# Patient Record
Sex: Male | Born: 1950 | Race: Black or African American | Hispanic: No | Marital: Married | Smoking: Former smoker
Health system: Southern US, Community
[De-identification: ages and names within clinical notes are randomized; demographics above are authoritative.]

## PROBLEM LIST (undated history)

## (undated) DIAGNOSIS — I951 Orthostatic hypotension: Secondary | ICD-10-CM

## (undated) DIAGNOSIS — K429 Umbilical hernia without obstruction or gangrene: Secondary | ICD-10-CM

## (undated) DIAGNOSIS — I639 Cerebral infarction, unspecified: Secondary | ICD-10-CM

## (undated) DIAGNOSIS — H3320 Serous retinal detachment, unspecified eye: Secondary | ICD-10-CM

## (undated) DIAGNOSIS — G459 Transient cerebral ischemic attack, unspecified: Secondary | ICD-10-CM

## (undated) DIAGNOSIS — I10 Essential (primary) hypertension: Secondary | ICD-10-CM

## (undated) DIAGNOSIS — E785 Hyperlipidemia, unspecified: Secondary | ICD-10-CM

## (undated) DIAGNOSIS — J449 Chronic obstructive pulmonary disease, unspecified: Secondary | ICD-10-CM

## (undated) DIAGNOSIS — N189 Chronic kidney disease, unspecified: Secondary | ICD-10-CM

## (undated) DIAGNOSIS — Z9989 Dependence on other enabling machines and devices: Secondary | ICD-10-CM

## (undated) DIAGNOSIS — N186 End stage renal disease: Secondary | ICD-10-CM

## (undated) DIAGNOSIS — R569 Unspecified convulsions: Secondary | ICD-10-CM

## (undated) DIAGNOSIS — E119 Type 2 diabetes mellitus without complications: Secondary | ICD-10-CM

## (undated) DIAGNOSIS — E11319 Type 2 diabetes mellitus with unspecified diabetic retinopathy without macular edema: Secondary | ICD-10-CM

## (undated) DIAGNOSIS — G4733 Obstructive sleep apnea (adult) (pediatric): Secondary | ICD-10-CM

## (undated) DIAGNOSIS — E669 Obesity, unspecified: Secondary | ICD-10-CM

## (undated) HISTORY — DX: Chronic kidney disease, unspecified: N18.9

## (undated) HISTORY — DX: Chronic obstructive pulmonary disease, unspecified: J44.9

## (undated) HISTORY — DX: Hyperlipidemia, unspecified: E78.5

## (undated) HISTORY — PX: SEPTOPLASTY: SUR1290

## (undated) HISTORY — DX: Type 2 diabetes mellitus with unspecified diabetic retinopathy without macular edema: E11.319

## (undated) HISTORY — DX: Cerebral infarction, unspecified: I63.9

## (undated) HISTORY — PX: CIRCUMCISION: SUR203

## (undated) HISTORY — PX: HERNIA REPAIR: SHX51

## (undated) HISTORY — DX: Dependence on other enabling machines and devices: Z99.89

## (undated) HISTORY — DX: Unspecified convulsions: R56.9

## (undated) HISTORY — DX: Serous retinal detachment, unspecified eye: H33.20

## (undated) HISTORY — DX: Umbilical hernia without obstruction or gangrene: K42.9

## (undated) HISTORY — PX: HAMMER TOE SURGERY: SHX385

## (undated) HISTORY — DX: Obstructive sleep apnea (adult) (pediatric): G47.33

## (undated) HISTORY — DX: Obesity, unspecified: E66.9

## (undated) HISTORY — PX: EYE SURGERY: SHX253

---

## 1999-05-29 ENCOUNTER — Emergency Department (HOSPITAL_COMMUNITY): Admission: EM | Admit: 1999-05-29 | Discharge: 1999-05-29 | Payer: Self-pay | Admitting: Emergency Medicine

## 2005-11-15 ENCOUNTER — Ambulatory Visit: Payer: Self-pay | Admitting: Cardiovascular Disease

## 2005-12-06 ENCOUNTER — Ambulatory Visit: Payer: Self-pay

## 2010-03-08 ENCOUNTER — Inpatient Hospital Stay (HOSPITAL_COMMUNITY): Admission: EM | Admit: 2010-03-08 | Discharge: 2010-03-12 | Payer: Self-pay | Admitting: Emergency Medicine

## 2010-03-08 ENCOUNTER — Encounter (INDEPENDENT_AMBULATORY_CARE_PROVIDER_SITE_OTHER): Payer: Self-pay | Admitting: Emergency Medicine

## 2010-03-08 ENCOUNTER — Ambulatory Visit: Payer: Self-pay | Admitting: Cardiology

## 2010-09-10 LAB — CK TOTAL AND CKMB (NOT AT ARMC)
CK, MB: 5.9 ng/mL — ABNORMAL HIGH (ref 0.3–4.0)
CK, MB: 7.2 ng/mL (ref 0.3–4.0)
Relative Index: 1.4 (ref 0.0–2.5)
Relative Index: 1.6 (ref 0.0–2.5)
Total CK: 369 U/L — ABNORMAL HIGH (ref 7–232)

## 2010-09-10 LAB — GLUCOSE, CAPILLARY
Glucose-Capillary: 236 mg/dL — ABNORMAL HIGH (ref 70–99)
Glucose-Capillary: 240 mg/dL — ABNORMAL HIGH (ref 70–99)
Glucose-Capillary: 101 mg/dL — ABNORMAL HIGH (ref 70–99)
Glucose-Capillary: 117 mg/dL — ABNORMAL HIGH (ref 70–99)
Glucose-Capillary: 159 mg/dL — ABNORMAL HIGH (ref 70–99)
Glucose-Capillary: 178 mg/dL — ABNORMAL HIGH (ref 70–99)
Glucose-Capillary: 206 mg/dL — ABNORMAL HIGH (ref 70–99)
Glucose-Capillary: 226 mg/dL — ABNORMAL HIGH (ref 70–99)
Glucose-Capillary: 243 mg/dL — ABNORMAL HIGH (ref 70–99)
Glucose-Capillary: 269 mg/dL — ABNORMAL HIGH (ref 70–99)
Glucose-Capillary: 273 mg/dL — ABNORMAL HIGH (ref 70–99)
Glucose-Capillary: 282 mg/dL — ABNORMAL HIGH (ref 70–99)
Glucose-Capillary: 296 mg/dL — ABNORMAL HIGH (ref 70–99)
Glucose-Capillary: 338 mg/dL — ABNORMAL HIGH (ref 70–99)
Glucose-Capillary: 363 mg/dL — ABNORMAL HIGH (ref 70–99)
Glucose-Capillary: 392 mg/dL — ABNORMAL HIGH (ref 70–99)
Glucose-Capillary: 461 mg/dL — ABNORMAL HIGH (ref 70–99)
Glucose-Capillary: 543 mg/dL — ABNORMAL HIGH (ref 70–99)
Glucose-Capillary: 84 mg/dL (ref 70–99)
Glucose-Capillary: 93 mg/dL (ref 70–99)

## 2010-09-10 LAB — CBC
HCT: 39.3 % (ref 39.0–52.0)
HCT: 39.7 % (ref 39.0–52.0)
Hemoglobin: 13.6 g/dL (ref 13.0–17.0)
Hemoglobin: 14.1 g/dL (ref 13.0–17.0)
MCH: 27.6 pg (ref 26.0–34.0)
MCH: 28 pg (ref 26.0–34.0)
MCHC: 34.1 g/dL (ref 30.0–36.0)
MCV: 80.7 fL (ref 78.0–100.0)
Platelets: 214 10*3/uL (ref 150–400)
RBC: 4.92 MIL/uL (ref 4.22–5.81)
RBC: 5.03 MIL/uL (ref 4.22–5.81)
RDW: 12.5 % (ref 11.5–15.5)
WBC: 7.2 10*3/uL (ref 4.0–10.5)

## 2010-09-10 LAB — POCT CARDIAC MARKERS
CKMB, poc: 5 ng/mL (ref 1.0–8.0)
Myoglobin, poc: 203 ng/mL (ref 12–200)
Troponin i, poc: <0.05 ng/mL (ref 0.00–0.09)

## 2010-09-10 LAB — URINALYSIS, ROUTINE W REFLEX MICROSCOPIC
Bilirubin Urine: NEGATIVE
Nitrite: NEGATIVE
Protein, ur: >300 mg/dL — AB
Specific Gravity, Urine: 1.027 (ref 1.005–1.030)
Urobilinogen, UA: 0.2 mg/dL (ref 0.0–1.0)

## 2010-09-10 LAB — COMPREHENSIVE METABOLIC PANEL
ALT: 24 U/L (ref 0–53)
AST: 30 U/L (ref 0–37)
Alkaline Phosphatase: 100 U/L (ref 39–117)
CO2: 25 mEq/L (ref 19–32)
Chloride: 98 mEq/L (ref 96–112)
Creatinine, Ser: 1.51 mg/dL — ABNORMAL HIGH (ref 0.4–1.5)
GFR calc Af Amer: 58 mL/min — ABNORMAL LOW (ref >60)
GFR calc non Af Amer: 48 mL/min — ABNORMAL LOW (ref >60)
Potassium: 4.7 mEq/L (ref 3.5–5.1)
Sodium: 132 mEq/L — ABNORMAL LOW (ref 135–145)
Total Bilirubin: 0.8 mg/dL (ref 0.3–1.2)

## 2010-09-10 LAB — LIPID PANEL
Cholesterol: 219 mg/dL — ABNORMAL HIGH (ref 0–200)
HDL: 37 mg/dL — ABNORMAL LOW (ref >39)
LDL Cholesterol: 144 mg/dL — ABNORMAL HIGH (ref 0–99)
LDL Cholesterol: 147 mg/dL — ABNORMAL HIGH (ref 0–99)
Total CHOL/HDL Ratio: 5.9 RATIO
Total CHOL/HDL Ratio: 5.9 RATIO
Triglycerides: 167 mg/dL — ABNORMAL HIGH (ref <150)
Triglycerides: 174 mg/dL — ABNORMAL HIGH (ref <150)
VLDL: 33 mg/dL (ref 0–40)
VLDL: 35 mg/dL (ref 0–40)

## 2010-09-10 LAB — APTT
aPTT: 26 s (ref 24–37)
aPTT: 27 seconds (ref 24–37)

## 2010-09-10 LAB — BASIC METABOLIC PANEL
BUN: 24 mg/dL — ABNORMAL HIGH (ref 6–23)
Calcium: 9.1 mg/dL (ref 8.4–10.5)
GFR calc non Af Amer: 51 mL/min — ABNORMAL LOW (ref >60)
Glucose, Bld: 198 mg/dL — ABNORMAL HIGH (ref 70–99)
Potassium: 3.8 mEq/L (ref 3.5–5.1)

## 2010-09-10 LAB — URINE MICROSCOPIC-ADD ON

## 2010-09-10 LAB — DIFFERENTIAL
Basophils Absolute: 0 10*3/uL (ref 0.0–0.1)
Eosinophils Absolute: 0.1 10*3/uL (ref 0.0–0.7)
Eosinophils Relative: 1 % (ref 0–5)

## 2010-09-10 LAB — PROTIME-INR
INR: 0.95 (ref 0.00–1.49)
Prothrombin Time: 12.9 seconds (ref 11.6–15.2)

## 2010-09-10 LAB — TROPONIN I: Troponin I: 0.03 ng/mL (ref 0.00–0.06)

## 2013-02-14 ENCOUNTER — Other Ambulatory Visit: Payer: Self-pay | Admitting: Internal Medicine

## 2013-02-14 DIAGNOSIS — R531 Weakness: Secondary | ICD-10-CM

## 2013-02-14 DIAGNOSIS — R609 Edema, unspecified: Secondary | ICD-10-CM

## 2013-02-15 ENCOUNTER — Ambulatory Visit
Admission: RE | Admit: 2013-02-15 | Discharge: 2013-02-15 | Disposition: A | Discharge status: Home / Self Care | Payer: BC Managed Care – PPO | Source: Ambulatory Visit | Attending: Internal Medicine | Admitting: Internal Medicine

## 2013-02-15 DIAGNOSIS — R609 Edema, unspecified: Secondary | ICD-10-CM

## 2013-02-20 ENCOUNTER — Ambulatory Visit
Admission: RE | Admit: 2013-02-20 | Discharge: 2013-02-20 | Disposition: A | Discharge status: Home / Self Care | Payer: BC Managed Care – PPO | Source: Ambulatory Visit | Attending: Internal Medicine | Admitting: Internal Medicine

## 2013-02-20 DIAGNOSIS — R531 Weakness: Secondary | ICD-10-CM

## 2013-02-20 DIAGNOSIS — R609 Edema, unspecified: Secondary | ICD-10-CM

## 2014-01-25 ENCOUNTER — Other Ambulatory Visit: Payer: Self-pay | Admitting: Nephrology

## 2014-01-25 DIAGNOSIS — E1129 Type 2 diabetes mellitus with other diabetic kidney complication: Secondary | ICD-10-CM

## 2014-01-29 ENCOUNTER — Ambulatory Visit
Admission: RE | Admit: 2014-01-29 | Discharge: 2014-01-29 | Disposition: A | Discharge status: Home / Self Care | Payer: 59 | Source: Ambulatory Visit | Attending: Nephrology | Admitting: Nephrology

## 2014-01-29 DIAGNOSIS — E1129 Type 2 diabetes mellitus with other diabetic kidney complication: Secondary | ICD-10-CM

## 2014-02-17 ENCOUNTER — Inpatient Hospital Stay (HOSPITAL_COMMUNITY)
Admission: EM | Admit: 2014-02-17 | Discharge: 2014-02-18 | DRG: 149 | Discharge status: Against Medical Advice | Payer: 59 | Attending: Internal Medicine | Admitting: Internal Medicine

## 2014-02-17 ENCOUNTER — Emergency Department (HOSPITAL_COMMUNITY): Payer: 59

## 2014-02-17 ENCOUNTER — Encounter (HOSPITAL_COMMUNITY): Payer: Self-pay | Admitting: Emergency Medicine

## 2014-02-17 DIAGNOSIS — R55 Syncope and collapse: Secondary | ICD-10-CM | POA: Diagnosis not present

## 2014-02-17 DIAGNOSIS — Z8673 Personal history of transient ischemic attack (TIA), and cerebral infarction without residual deficits: Secondary | ICD-10-CM

## 2014-02-17 DIAGNOSIS — I129 Hypertensive chronic kidney disease with stage 1 through stage 4 chronic kidney disease, or unspecified chronic kidney disease: Secondary | ICD-10-CM | POA: Diagnosis present

## 2014-02-17 DIAGNOSIS — Z79899 Other long term (current) drug therapy: Secondary | ICD-10-CM | POA: Diagnosis not present

## 2014-02-17 DIAGNOSIS — Z7982 Long term (current) use of aspirin: Secondary | ICD-10-CM

## 2014-02-17 DIAGNOSIS — N184 Chronic kidney disease, stage 4 (severe): Secondary | ICD-10-CM | POA: Diagnosis present

## 2014-02-17 DIAGNOSIS — E119 Type 2 diabetes mellitus without complications: Secondary | ICD-10-CM | POA: Diagnosis present

## 2014-02-17 DIAGNOSIS — I517 Cardiomegaly: Secondary | ICD-10-CM | POA: Diagnosis present

## 2014-02-17 DIAGNOSIS — Z794 Long term (current) use of insulin: Secondary | ICD-10-CM

## 2014-02-17 DIAGNOSIS — N178 Other acute kidney failure: Secondary | ICD-10-CM

## 2014-02-17 DIAGNOSIS — E1129 Type 2 diabetes mellitus with other diabetic kidney complication: Secondary | ICD-10-CM | POA: Diagnosis present

## 2014-02-17 DIAGNOSIS — I10 Essential (primary) hypertension: Secondary | ICD-10-CM | POA: Diagnosis present

## 2014-02-17 DIAGNOSIS — Z87891 Personal history of nicotine dependence: Secondary | ICD-10-CM

## 2014-02-17 DIAGNOSIS — R42 Dizziness and giddiness: Principal | ICD-10-CM | POA: Diagnosis present

## 2014-02-17 DIAGNOSIS — E860 Dehydration: Secondary | ICD-10-CM | POA: Diagnosis present

## 2014-02-17 DIAGNOSIS — R9431 Abnormal electrocardiogram [ECG] [EKG]: Secondary | ICD-10-CM | POA: Diagnosis present

## 2014-02-17 HISTORY — DX: Type 2 diabetes mellitus without complications: E11.9

## 2014-02-17 HISTORY — DX: Essential (primary) hypertension: I10

## 2014-02-17 HISTORY — DX: Transient cerebral ischemic attack, unspecified: G45.9

## 2014-02-17 LAB — PRO B NATRIURETIC PEPTIDE: Pro B Natriuretic peptide (BNP): 3134 pg/mL — ABNORMAL HIGH (ref 0–125)

## 2014-02-17 LAB — CBC WITH DIFFERENTIAL/PLATELET
Basophils Absolute: 0 10*3/uL (ref 0.0–0.1)
Basophils Relative: 0 % (ref 0–1)
EOS PCT: 1 % (ref 0–5)
Eosinophils Absolute: 0.1 10*3/uL (ref 0.0–0.7)
HCT: 39.1 % (ref 39.0–52.0)
Hemoglobin: 12.7 g/dL — ABNORMAL LOW (ref 13.0–17.0)
LYMPHS ABS: 1.1 10*3/uL (ref 0.7–4.0)
Lymphocytes Relative: 12 % (ref 12–46)
MCH: 26.9 pg (ref 26.0–34.0)
MCHC: 32.5 g/dL (ref 30.0–36.0)
MCV: 82.8 fL (ref 78.0–100.0)
MONO ABS: 0.6 10*3/uL (ref 0.1–1.0)
Monocytes Relative: 7 % (ref 3–12)
Neutro Abs: 7.1 10*3/uL (ref 1.7–7.7)
Neutrophils Relative %: 80 % — ABNORMAL HIGH (ref 43–77)
PLATELETS: 163 10*3/uL (ref 150–400)
RBC: 4.72 MIL/uL (ref 4.22–5.81)
RDW: 13.1 % (ref 11.5–15.5)
WBC: 8.9 10*3/uL (ref 4.0–10.5)

## 2014-02-17 LAB — GLUCOSE, CAPILLARY: Glucose-Capillary: 94 mg/dL (ref 70–99)

## 2014-02-17 LAB — COMPREHENSIVE METABOLIC PANEL
ALT: 24 U/L (ref 0–53)
ANION GAP: 13 (ref 5–15)
AST: 32 U/L (ref 0–37)
Albumin: 3.4 g/dL — ABNORMAL LOW (ref 3.5–5.2)
Alkaline Phosphatase: 73 U/L (ref 39–117)
BUN: 40 mg/dL — ABNORMAL HIGH (ref 6–23)
CALCIUM: 9.1 mg/dL (ref 8.4–10.5)
CO2: 23 mEq/L (ref 19–32)
CREATININE: 3.23 mg/dL — AB (ref 0.50–1.35)
Chloride: 100 mEq/L (ref 96–112)
GFR calc non Af Amer: 19 mL/min — ABNORMAL LOW (ref >90)
GFR, EST AFRICAN AMERICAN: 22 mL/min — AB (ref >90)
GLUCOSE: 103 mg/dL — AB (ref 70–99)
Potassium: 4.4 mEq/L (ref 3.7–5.3)
SODIUM: 136 meq/L — AB (ref 137–147)
TOTAL PROTEIN: 7.1 g/dL (ref 6.0–8.3)
Total Bilirubin: 0.3 mg/dL (ref 0.3–1.2)

## 2014-02-17 LAB — URINALYSIS, ROUTINE W REFLEX MICROSCOPIC
Bilirubin Urine: NEGATIVE
Glucose, UA: NEGATIVE mg/dL
HGB URINE DIPSTICK: NEGATIVE
Ketones, ur: NEGATIVE mg/dL
LEUKOCYTES UA: NEGATIVE
NITRITE: NEGATIVE
SPECIFIC GRAVITY, URINE: 1.019 (ref 1.005–1.030)
Urobilinogen, UA: 1 mg/dL (ref 0.0–1.0)
pH: 6.5 (ref 5.0–8.0)

## 2014-02-17 LAB — URINE MICROSCOPIC-ADD ON

## 2014-02-17 LAB — TROPONIN I

## 2014-02-17 LAB — D-DIMER, QUANTITATIVE (NOT AT ARMC): D DIMER QUANT: 0.55 ug{FEU}/mL — AB (ref 0.00–0.48)

## 2014-02-17 MED ORDER — ONDANSETRON HCL 4 MG/2ML IJ SOLN: 4.0000 mg | Freq: Four times a day (QID) | INTRAMUSCULAR | Status: DC | PRN

## 2014-02-17 MED ORDER — ASPIRIN 81 MG PO CHEW
162.0000 mg | CHEWABLE_TABLET | Freq: Every day | ORAL | Status: DC
  Administered 2014-02-18: 162 mg via ORAL
  Filled 2014-02-17: qty 2

## 2014-02-17 MED ORDER — NEBIVOLOL HCL 5 MG PO TABS
5.0000 mg | ORAL_TABLET | Freq: Every day | ORAL | Status: DC
  Administered 2014-02-17: 5 mg via ORAL
  Filled 2014-02-17: qty 1

## 2014-02-17 MED ORDER — INSULIN ASPART 100 UNIT/ML ~~LOC~~ SOLN: 0.0000 [IU] | Freq: Every day | SUBCUTANEOUS | Status: DC

## 2014-02-17 MED ORDER — HYDRALAZINE HCL 50 MG PO TABS
50.0000 mg | ORAL_TABLET | Freq: Two times a day (BID) | ORAL | Status: DC
  Administered 2014-02-18 (×2): 50 mg via ORAL
  Filled 2014-02-17 (×3): qty 1

## 2014-02-17 MED ORDER — ACETAMINOPHEN 650 MG RE SUPP: 650.0000 mg | Freq: Four times a day (QID) | RECTAL | Status: DC | PRN

## 2014-02-17 MED ORDER — NEBIVOLOL HCL 5 MG PO TABS
5.0000 mg | ORAL_TABLET | Freq: Every day | ORAL | Status: DC
  Administered 2014-02-18: 5 mg via ORAL
  Filled 2014-02-17: qty 1

## 2014-02-17 MED ORDER — ENOXAPARIN SODIUM 30 MG/0.3ML ~~LOC~~ SOLN
30.0000 mg | SUBCUTANEOUS | Status: DC
  Administered 2014-02-18: 30 mg via SUBCUTANEOUS
  Filled 2014-02-17: qty 0.3

## 2014-02-17 MED ORDER — SODIUM CHLORIDE 0.9 % IV SOLN
1000.0000 mL | INTRAVENOUS | Status: DC
Start: 2014-02-17 — End: 2014-02-18
  Administered 2014-02-18: 1000 mL via INTRAVENOUS

## 2014-02-17 MED ORDER — SODIUM CHLORIDE 0.9 % IJ SOLN
3.0000 mL | Freq: Two times a day (BID) | INTRAMUSCULAR | Status: DC
  Administered 2014-02-18: 3 mL via INTRAVENOUS

## 2014-02-17 MED ORDER — INSULIN GLARGINE 100 UNIT/ML ~~LOC~~ SOLN
16.0000 [IU] | Freq: Every day | SUBCUTANEOUS | Status: DC
  Administered 2014-02-18: 16 [IU] via SUBCUTANEOUS
  Filled 2014-02-17 (×2): qty 0.16

## 2014-02-17 MED ORDER — FOLIC ACID 1 MG PO TABS
2.0000 mg | ORAL_TABLET | Freq: Every day | ORAL | Status: DC
  Administered 2014-02-18: 2 mg via ORAL
  Filled 2014-02-17: qty 2

## 2014-02-17 MED ORDER — INSULIN ASPART 100 UNIT/ML ~~LOC~~ SOLN
0.0000 [IU] | Freq: Three times a day (TID) | SUBCUTANEOUS | Status: DC
Start: 2014-02-18 — End: 2014-02-18

## 2014-02-17 MED ORDER — ATORVASTATIN CALCIUM 40 MG PO TABS
40.0000 mg | ORAL_TABLET | Freq: Every day | ORAL | Status: DC
  Administered 2014-02-18: 40 mg via ORAL
  Filled 2014-02-17: qty 1

## 2014-02-17 MED ORDER — SODIUM CHLORIDE 0.9 % IV SOLN
1000.0000 mL | INTRAVENOUS | Status: DC
  Administered 2014-02-17: 1000 mL via INTRAVENOUS

## 2014-02-17 MED ORDER — POLYETHYLENE GLYCOL 3350 17 G PO PACK
17.0000 g | PACK | Freq: Every day | ORAL | Status: DC | PRN
  Filled 2014-02-17: qty 1

## 2014-02-17 MED ORDER — ACETAMINOPHEN 325 MG PO TABS: 650.0000 mg | ORAL_TABLET | Freq: Four times a day (QID) | ORAL | Status: DC | PRN

## 2014-02-17 MED ORDER — CALCITRIOL 0.25 MCG PO CAPS
0.2500 ug | ORAL_CAPSULE | Freq: Every day | ORAL | Status: DC
  Administered 2014-02-18: 0.25 ug via ORAL
  Filled 2014-02-17: qty 1

## 2014-02-17 MED ORDER — HYDRALAZINE HCL 20 MG/ML IJ SOLN
10.0000 mg | INTRAMUSCULAR | Status: AC
  Administered 2014-02-17: 10 mg via INTRAVENOUS
  Filled 2014-02-17: qty 1

## 2014-02-17 MED ORDER — DOCUSATE SODIUM 100 MG PO CAPS
100.0000 mg | ORAL_CAPSULE | Freq: Two times a day (BID) | ORAL | Status: DC
  Administered 2014-02-18 (×2): 100 mg via ORAL
  Filled 2014-02-17 (×3): qty 1

## 2014-02-17 MED ORDER — PREDNISOLONE ACETATE 1 % OP SUSP
1.0000 [drp] | OPHTHALMIC | Status: DC
  Administered 2014-02-18 (×4): 1 [drp] via OPHTHALMIC
  Filled 2014-02-17 (×2): qty 1

## 2014-02-17 MED ORDER — ONDANSETRON HCL 4 MG PO TABS: 4.0000 mg | ORAL_TABLET | Freq: Four times a day (QID) | ORAL | Status: DC | PRN

## 2014-02-17 MED ORDER — HYDROCODONE-ACETAMINOPHEN 5-325 MG PO TABS: 1.0000 | ORAL_TABLET | ORAL | Status: DC | PRN

## 2014-02-17 NOTE — H&P (Signed)
PCP: Abbott Pao    Chief Complaint:  lightheadedness  HPI: Gregory Hood is a 63 y.o. male   has a past medical history of Hypertension; Diabetes mellitus without complication; and TIA (transient ischemic attack).   Presented with  Patient was standing next to a truck and was getting hot while standing for a prolonged period of time outside. He started to feel lightheaded. Patient states he feels dehydrated. He have not had much to eat or drink this AM. Patient has been on a diet and have been drinking smoothies. He has lost over 30 lb over past 2-3 months.  Denies any loss of concienceness per record wife thought he was "out" but patient reports remembering everything. 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   Hospitalist was called for admission for  presyncope  Review of Systems:    Pertinent positives include:   Constitutional:  No weight loss, night sweats, Fevers, chills, fatigue, weight loss  HEENT:  No headaches, Difficulty swallowing,Tooth/dental problems,Sore throat,  No sneezing, itching, ear ache, nasal congestion, post nasal drip,  Cardio-vascular:  No chest pain, Orthopnea, PND, anasarca, dizziness, palpitations.no Bilateral lower extremity swelling  GI:  No heartburn, indigestion, abdominal pain, nausea, vomiting, diarrhea, change in bowel habits, loss of appetite, melena, blood in stool, hematemesis Resp:  no shortness of breath at rest. No dyspnea on exertion, No excess mucus, no productive cough, No non-productive cough, No coughing up of blood.No change in color of mucus.No wheezing. Skin:  no rash or lesions. No jaundice GU:  no dysuria, change in color of urine, no urgency or frequency. No straining to urinate.  No flank pain.    Musculoskeletal:  No joint pain or no joint swelling. No decreased range of motion. No back pain.  Psych:  No change in mood or affect. No depression or anxiety. No memory loss.  Neuro: no localizing neurological complaints, no tingling, no weakness, no double vision, no gait abnormality, no slurred speech, no confusion  Otherwise ROS are negative except for above, 10 systems were reviewed  Past Medical History: Past Medical History  Diagnosis Date  . Hypertension   . Diabetes mellitus without complication   . TIA (transient ischemic attack)    History reviewed. No pertinent past surgical history.   Medications: Prior to Admission medications   Medication Sig Start Date End Date Taking? Authorizing Provider  aspirin 81 MG tablet Take 162 mg by mouth daily.   Yes Historical Provider, MD  atorvastatin (LIPITOR) 40 MG tablet Take 40 mg by mouth daily.   Yes Historical Provider, MD  Brinzolamide-Brimonidine [facility]) 1-0.2 % SUSP Place 1 drop into the left eye 3 (three) times daily.   Yes Historical Provider, MD  calcitRIOL (ROCALTROL) 0.25 MCG capsule Take 0.25 mcg by mouth daily.   Yes Historical Provider, MD  Difluprednate (DUREZOL) 0.05 % EMUL Place 1 drop into the left eye every 3 (three) hours. Takes 8 times daily   Yes Historical Provider, MD  folic acid (FOLVITE) 1 MG tablet Take 2 mg by mouth daily.   Yes Historical Provider, MD  furosemide (LASIX) 80 MG tablet Take 80 mg by mouth daily.   Yes Historical Provider, MD  Garlic [id number] MG CAPS Take 1,000 mg by mouth daily.  Yes Historical Provider, MD  hydrALAZINE (APRESOLINE) 50 MG tablet Take 50 mg by mouth 2 (two) times daily.   Yes Historical Provider, MD  insulin glargine (LANTUS) 100 UNIT/ML injection Inject 16 Units into the skin at bedtime.   Yes Historical Provider, MD  linagliptin (TRADJENTA) 5 MG TABS tablet Take 5 mg by mouth daily.   Yes Historical Provider, MD  nebivolol (BYSTOLIC) 5 MG tablet Take 10 mg by mouth  daily.    Yes Historical Provider, MD  polyethylene glycol (MIRALAX / GLYCOLAX) packet Take 17 g by mouth daily as needed for moderate constipation.    Yes Historical Provider, MD    Allergies:   Allergies  Allergen Reactions  . Ace Inhibitors Cough    Social History:  Ambulatory   independently   Lives at home   With family   reports that he has quit smoking. He does not have any smokeless tobacco history on file. He reports that he does not drink alcohol or use illicit drugs.   Family History: family history includes Diabetes type II in his father, mother, and sister; Hypertension in his father, mother, and sister; Transient ischemic attack in his mother.     Physical Exam: Patient Vitals for the past 24 hrs:  BP Temp Temp src Pulse Resp SpO2 Height Weight  02/17/14 1930 149/91 mmHg - - 59 14 100 % - -  02/17/14 1927 177/100 mmHg - - - - - - -  02/17/14 1915 177/100 mmHg - - 69 17 100 % - -  02/17/14 1900 171/99 mmHg - - 65 16 100 % - -  02/17/14 1845 178/98 mmHg - - 65 12 100 % - -  02/17/14 1834 169/95 mmHg - - - 16 99 % - -  02/17/14 1715 185/100 mmHg - - 76 14 100 % - -  02/17/14 1645 172/97 mmHg - - 68 15 100 % - -  02/17/14 1615 179/91 mmHg - - 67 - 100 % - -  02/17/14 1602 165/77 mmHg 98.3 F (36.8 C) Oral - 18 100 % 5\' 9"  (1.753 m) 106.142 kg (234 lb)    1. General:  in No Acute distress 2. Psychological: Alert and  Oriented 3. Head/ENT:     Dry Mucous Membranes                          Head Non traumatic, neck supple                          Normal  Dentition 4. SKIN:  decreased Skin turgor,  Skin clean Dry and intact no rash 5. Heart: Regular rate and rhythm no Murmur, Rub or gallop 6. Lungs: Clear to auscultation bilaterally, no wheezes or crackles   7. Abdomen: Soft, non-tender, Non distended 8. Lower extremities: no clubbing, cyanosis, left leg swelling patient states chronic 9. Neurologically strength 5/5 in all 4 ext. CN 2-12 intact  10. MSK: Normal  range of motion  body mass index is 34.54 kg/(m^2).   Labs on Admission:   Recent Labs  02/17/14 1706  NA 136*  K 4.4  CL 100  CO2 23  GLUCOSE 103*  BUN 40*  CREATININE 3.23*  CALCIUM 9.1    Recent Labs  02/17/14 1706  AST 32  ALT 24  ALKPHOS 73  BILITOT 0.3  PROT 7.1  ALBUMIN 3.4*   No results found for this basename: LIPASE, AMYLASE,  in the last 72 hours  Recent Labs  02/17/14 1706  WBC 8.9  NEUTROABS 7.1  HGB 12.7*  HCT 39.1  MCV 82.8  PLT 163   No results found for this basename: CKTOTAL, CKMB, CKMBINDEX, TROPONINI,  in the last 72 hours No results found for this basename: TSH, T4TOTAL, FREET3, T3FREE, THYROIDAB,  in the last 72 hours No results found for this basename: VITAMINB12, FOLATE, FERRITIN, TIBC, IRON, RETICCTPCT,  in the last 72 hours Lab Results  Component Value Date   HGBA1C  Value: 14.6 (NOTE)                                                                       According to the ADA Clinical Practice Recommendations for 2011, when HbA1c is used as a screening test:   >=6.5%   Diagnostic of Diabetes Mellitus           (if abnormal result  is confirmed)  5.7-6.4%   Increased risk of developing Diabetes Mellitus  References:Diagnosis and Classification of Diabetes Mellitus,Diabetes Care,2011,34(Suppl 1):S62-S69 and Standards of Medical Care in         Diabetes - 2011,Diabetes Care,2011,34  (Suppl 1):S11-S61.* 03/08/2010    Estimated Creatinine Clearance: 28.8 ml/min (by C-G formula based on Cr of 3.23). ABG No results found for this basename: phart, pco2, po2, hco3, tco2, acidbasedef, o2sat     No results found for this basename: DDIMER     Other results:  I have pearsonaly reviewed this: ECG REPORT  Rate: 73  Rhythm: Sinus rhythm evidence of LVH ST&T Change: T wave inversion in the lateral leads nonspecific ST segment changes abnormal EKG no old EKG available for comparison   UA protein uria  BNP (last 3 results) No results found  for this basename: PROBNP,  in the last 8760 hours  Filed Weights   02/17/14 1602  Weight: 106.142 kg (234 lb)     Cultures: No results found for this basename: sdes, specrequest, cult, reptstatus      Radiological Exams on Admission: Dg Chest 2 View  02/17/2014   CLINICAL DATA:  Near syncope.  Dizziness.  EXAM: CHEST  2 VIEW  COMPARISON:  None.  FINDINGS: Enlarged cardiac silhouette.  Clear lungs.  Mild scoliosis.  IMPRESSION: Cardiomegaly.  No acute abnormality.   Electronically Signed   By: Enrique Sack M.D.   On: 02/17/2014 17:51   Echo in March 2013 showed Severe LVH.  Chart has been reviewed  Assessment/Plan  76 male with history of diabetes, chronic kidney disease, stroke, hypertension, Severe LVH here with lightheadedness after standing up for too long while currently dieting.   Present on Admission:  . Lightheadedness - in the setting of standing up for prolonged period time. EKG showing evidence of LVH with T wave inversion in the lateral leads. No old EKG available for comparison. Patient denies any chest pain will admit to telemetry cycle cardiac enzymes given the left leg swelling check for DVT. Obtain echo and carotid Dopplers. 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 Currently neurologically intact no neurological complaints  . Hypertension continue home medications monitor on telemetry  . Diabetes mellitus sliding scale hold by mouth meds  . CKD (chronic  kidney disease), stage IV - last creatinine in March was 2.7 currently elevated to 3.4. Patient appears to be somewhat fluid depleted in the setting of recent dieting. Will hold Lasix gentle IV fluids and monitor fluid status  . Dehydration check orthostatics hold Lasix, gentle IV fluids  . Abnormal ECG - cycle cardiac enzymes, chronic telemetry, echo   Prophylaxis:   Lovenox  CODE STATUS:  FULL CODE    Other plan as per orders.  I have spent a total of 55 min on  this admission  Curtisha Bendix 02/17/2014, 7:50 PM  [facility]  Pager [phone]   If 7AM-7PM, please contact the day team taking care of the patient  [email or web address]  Password [email or web address]

## 2014-02-17 NOTE — ED Provider Notes (Signed)
CSN: [id number]     Arrival date & time 02/17/14  1600 History   First MD Initiated Contact with Patient 02/17/14 1607     Chief Complaint  Patient presents with  . Loss of Consciousness      HPI  Patient presents after an episode of seizure-like activity versus near syncope. Patient states that he was in his usual state of health until approximately one hour prior to my evaluation. The patient was walking, felt lightheaded, seemed disoriented, with a contorted face. Patient denies complete loss of consciousness, but the patient's wife states the patient was not responsive for several moments without falling, or suffering trauma. Subsequently, the patient required no intervention, but returned to baseline, with no ongoing pain, confusion, disorientation, and currently has no complaints. Patient states he does have a history of prior TIA or a stroke, and currently is being evaluated for renal dysfunction.   Past Medical History  Diagnosis Date  . Hypertension   . Diabetes mellitus without complication   . TIA (transient ischemic attack)    History reviewed. No pertinent past surgical history. Family History  Problem Relation Age of Onset  . Hypertension Mother   . Diabetes type II Mother   . Transient ischemic attack Mother   . Hypertension Father   . Diabetes type II Father   . Diabetes type II Sister   . Hypertension Sister    History  Substance Use Topics  . Smoking status: Former Research scientist (life sciences)  . Smokeless tobacco: Not on file  . Alcohol Use: No    Review of Systems  Constitutional:       Per HPI, otherwise negative  HENT:       Per HPI, otherwise negative  Respiratory:       Per HPI, otherwise negative  Cardiovascular:       Per HPI, otherwise negative  Gastrointestinal: Negative for vomiting.  Endocrine:       Negative aside from HPI  Genitourinary:       Neg aside from HPI   Musculoskeletal:       Per HPI, otherwise negative  Skin: Negative.   Neurological:  Negative for syncope.      Allergies  Ace inhibitors  Home Medications   Prior to Admission medications   Medication Sig Start Date End Date Taking? Authorizing Provider  aspirin 81 MG tablet Take 162 mg by mouth daily.   Yes Historical Provider, MD  atorvastatin (LIPITOR) 40 MG tablet Take 40 mg by mouth daily.   Yes Historical Provider, MD  Brinzolamide-Brimonidine [facility]) 1-0.2 % SUSP Place 1 drop into the left eye 3 (three) times daily.   Yes Historical Provider, MD  calcitRIOL (ROCALTROL) 0.25 MCG capsule Take 0.25 mcg by mouth daily.   Yes Historical Provider, MD  Difluprednate (DUREZOL) 0.05 % EMUL Place 1 drop into the left eye every 3 (three) hours. Takes 8 times daily   Yes Historical Provider, MD  folic acid (FOLVITE) 1 MG tablet Take 2 mg by mouth daily.   Yes Historical Provider, MD  furosemide (LASIX) 80 MG tablet Take 80 mg by mouth daily.   Yes Historical Provider, MD  Garlic [id number] MG CAPS Take 1,000 mg by mouth daily.   Yes Historical Provider, MD  hydrALAZINE (APRESOLINE) 50 MG tablet Take 50 mg by mouth 2 (two) times daily.   Yes Historical Provider, MD  insulin glargine (LANTUS) 100 UNIT/ML injection Inject 16 Units into the skin at bedtime.   Yes Historical Provider, MD  linagliptin (TRADJENTA) 5 MG TABS tablet Take 5 mg by mouth daily.   Yes Historical Provider, MD  nebivolol (BYSTOLIC) 5 MG tablet Take 10 mg by mouth daily.    Yes Historical Provider, MD  polyethylene glycol (MIRALAX / GLYCOLAX) packet Take 17 g by mouth daily as needed for moderate constipation.    Yes Historical Provider, MD   BP 162/89  Pulse 75  Temp(Src) 98.6 F (37 C) (Oral)  Resp 18  Ht 5' 9.5" (1.765 m)  Wt 236 lb 6.4 oz (107.23 kg)  BMI 34.42 kg/m2  SpO2 98% Physical Exam  Nursing note and vitals reviewed. Constitutional: He is oriented to person, place, and time. He appears well-developed. No distress.  HENT:  Head: Normocephalic and atraumatic.  Eyes: Conjunctivae and EOM  are normal.  Cardiovascular: Normal rate and regular rhythm.   Pulmonary/Chest: Effort normal. No stridor. No respiratory distress.  Abdominal: He exhibits no distension.  Musculoskeletal: He exhibits no edema.  Neurological: He is alert and oriented to person, place, and time. No cranial nerve deficit. He exhibits normal muscle tone. Coordination normal.  Skin: Skin is warm and dry.  Psychiatric: He has a normal mood and affect.    ED Course  Procedures (including critical care time) Labs Review Labs Reviewed  CBC WITH DIFFERENTIAL - Abnormal; Notable for the following:    Hemoglobin 12.7 (*)    Neutrophils Relative % 80 (*)    All other components within normal limits  COMPREHENSIVE METABOLIC PANEL - Abnormal; Notable for the following:    Sodium 136 (*)    Glucose, Bld 103 (*)    BUN 40 (*)    Creatinine, Ser 3.23 (*)    Albumin 3.4 (*)    GFR calc non Af Amer 19 (*)    GFR calc Af Amer 22 (*)    All other components within normal limits  URINALYSIS, ROUTINE W REFLEX MICROSCOPIC - Abnormal; Notable for the following:    Protein, ur >300 (*)    All other components within normal limits  URINE MICROSCOPIC-ADD ON - Abnormal; Notable for the following:    Squamous Epithelial / LPF FEW (*)    All other components within normal limits  D-DIMER, QUANTITATIVE - Abnormal; Notable for the following:    D-Dimer, Quant 0.55 (*)    All other components within normal limits  PRO B NATRIURETIC PEPTIDE - Abnormal; Notable for the following:    Pro B Natriuretic peptide (BNP) 3134.0 (*)    All other components within normal limits  TROPONIN I  GLUCOSE, CAPILLARY  MAGNESIUM  PHOSPHORUS  TSH  COMPREHENSIVE METABOLIC PANEL  CBC  CBC  CREATININE, SERUM  HEMOGLOBIN A1C  TROPONIN I  TROPONIN I  LIPID PANEL    Imaging Review Dg Chest 2 View  02/17/2014   CLINICAL DATA:  Near syncope.  Dizziness.  EXAM: CHEST  2 VIEW  COMPARISON:  None.  FINDINGS: Enlarged cardiac silhouette.  Clear  lungs.  Mild scoliosis.  IMPRESSION: Cardiomegaly.  No acute abnormality.   Electronically Signed   By: Enrique Sack M.D.   On: 02/17/2014 17:51     EKG Interpretation   Date/Time:  Sunday February 17 2014 15:58:47 EDT Ventricular Rate:  73 PR Interval:  192 QRS Duration: 87 QT Interval:  403 QTC Calculation: 444 R Axis:   70 Text Interpretation:  Age not entered, assumed to be  63 years old for  purpose of ECG interpretation Sinus rhythm Consider left atrial  enlargement RSR'  in V1 or V2, probably normal variant Consider left  ventricular hypertrophy Abnormal T, consider ischemia, lateral leads ST  elevation, consider anterior injury Sinus rhythm Left ventricular  hypertrophy RSR prime ST-t wave abnormality Abnormal ekg Confirmed by  Carmin Muskrat  MD [phone]) on 2014/02/22 5:02:50 PM      MDM   Final diagnoses:  Lightheadedness  Abnormal ECG  CKD (chronic kidney disease), stage IV  Dehydration    Patient presents after an episode likely syncope. Patient has recovered to baseline, but continues to feel fatigued, though with no chest pain, dyspnea. Given the patient's multiple risk factors, though the initial evaluation here is largely reassuring, the patient was admitted for further evaluation and management. She is initial EKG was abnormal, but is consistent with multiple prior similar studies.     Carmin Muskrat, MD 02-22-2014 [phone]

## 2014-02-17 NOTE — ED Notes (Signed)
Per EMS pt had near syncope/ seizure episode after church. Pt wife say that he went unconscious and started to have jerking movements. Pt sts he remembers everything that happen. Pt denies any seizure hx. Pt hx TIA (2011), HTN, DM

## 2014-02-18 DIAGNOSIS — M7989 Other specified soft tissue disorders: Secondary | ICD-10-CM

## 2014-02-18 DIAGNOSIS — R55 Syncope and collapse: Secondary | ICD-10-CM

## 2014-02-18 DIAGNOSIS — N178 Other acute kidney failure: Secondary | ICD-10-CM

## 2014-02-18 LAB — HEMOGLOBIN A1C
HEMOGLOBIN A1C: 7.4 % — AB (ref <5.7)
MEAN PLASMA GLUCOSE: 166 mg/dL — AB (ref <117)

## 2014-02-18 LAB — CBC
HEMATOCRIT: 37.8 % — AB (ref 39.0–52.0)
Hemoglobin: 12.4 g/dL — ABNORMAL LOW (ref 13.0–17.0)
MCH: 27.4 pg (ref 26.0–34.0)
MCHC: 32.8 g/dL (ref 30.0–36.0)
MCV: 83.4 fL (ref 78.0–100.0)
Platelets: 184 10*3/uL (ref 150–400)
RBC: 4.53 MIL/uL (ref 4.22–5.81)
RDW: 13.3 % (ref 11.5–15.5)
WBC: 8.6 10*3/uL (ref 4.0–10.5)

## 2014-02-18 LAB — LIPID PANEL
CHOLESTEROL: 156 mg/dL (ref 0–200)
HDL: 38 mg/dL — ABNORMAL LOW (ref >39)
LDL Cholesterol: 97 mg/dL (ref 0–99)
Total CHOL/HDL Ratio: 4.1 RATIO
Triglycerides: 105 mg/dL (ref <150)
VLDL: 21 mg/dL (ref 0–40)

## 2014-02-18 LAB — COMPREHENSIVE METABOLIC PANEL
ALK PHOS: 72 U/L (ref 39–117)
ALT: 22 U/L (ref 0–53)
AST: 29 U/L (ref 0–37)
Albumin: 3.3 g/dL — ABNORMAL LOW (ref 3.5–5.2)
Anion gap: 12 (ref 5–15)
BUN: 41 mg/dL — ABNORMAL HIGH (ref 6–23)
CO2: 25 mEq/L (ref 19–32)
Calcium: 8.8 mg/dL (ref 8.4–10.5)
Chloride: 102 mEq/L (ref 96–112)
Creatinine, Ser: 3.31 mg/dL — ABNORMAL HIGH (ref 0.50–1.35)
GFR calc non Af Amer: 19 mL/min — ABNORMAL LOW (ref >90)
GFR, EST AFRICAN AMERICAN: 22 mL/min — AB (ref >90)
GLUCOSE: 226 mg/dL — AB (ref 70–99)
Potassium: 4.3 mEq/L (ref 3.7–5.3)
Sodium: 139 mEq/L (ref 137–147)
TOTAL PROTEIN: 6.8 g/dL (ref 6.0–8.3)
Total Bilirubin: 0.4 mg/dL (ref 0.3–1.2)

## 2014-02-18 LAB — TROPONIN I
Troponin I: <0.3 ng/mL (ref <0.30)
Troponin I: <0.3 ng/mL (ref <0.30)
Troponin I: <0.3 ng/mL (ref <0.30)

## 2014-02-18 LAB — PHOSPHORUS: Phosphorus: 3.7 mg/dL (ref 2.3–4.6)

## 2014-02-18 LAB — GLUCOSE, CAPILLARY
GLUCOSE-CAPILLARY: 78 mg/dL (ref 70–99)
GLUCOSE-CAPILLARY: 119 mg/dL — AB (ref 70–99)

## 2014-02-18 LAB — MAGNESIUM: MAGNESIUM: 2.5 mg/dL (ref 1.5–2.5)

## 2014-02-18 LAB — TSH: TSH: 2.05 u[IU]/mL (ref 0.350–4.500)

## 2014-02-18 MED ORDER — SODIUM CHLORIDE 0.9 % IV SOLN: 1000.0000 mL | INTRAVENOUS | Status: DC

## 2014-02-18 NOTE — Progress Notes (Signed)
Pt decided to leave unit AMA. MD aware, bedside to speak to pt. RN educated pt of elevated BP, pt states he understands and accepts the risk of leaving AMA. IV removed, site within normal limits. Pt d/c'd off unit via wheelchair.

## 2014-02-18 NOTE — Progress Notes (Signed)
*  PRELIMINARY RESULTS* Vascular Ultrasound Left lower extremity venous duplex has been completed.  Left= no evidence of DVT.  Carotid Doppler has been completed. Bilateral:  1-39% ICA stenosis.  Vertebral artery flow is antegrade.      Landry Mellow, RDMS, RVT  02/18/2014, 12:34 PM

## 2014-02-18 NOTE — Progress Notes (Signed)
UR Completed Paxon Propes Graves-Bigelow, RN,BSN [phone]  

## 2014-02-18 NOTE — Discharge Summary (Addendum)
Physician Discharge Summary  Gregory Hood [id number] DOB: 21-Dec-1950 DOA: 02/17/2014  PCP: No primary provider on file.  Admit date: 02/17/2014 Discharge date: 02/18/2014  Time spent: 35 minutes  Patient Signing out Against Medical Advice  Recommendations for Outpatient Follow-up:  1. Pt to follow up with Primary Nephrologist and PCP as scheduled  Discharge Diagnoses:  Active Problems:   Lightheadedness   Hypertension   Diabetes mellitus   CKD (chronic kidney disease), stage IV   Dehydration   Abnormal ECG   Discharge Condition: Stable  Diet recommendation: Diabetic  Filed Weights   02/17/14 1602 02/17/14 2129 02/18/14 0416  Weight: 106.142 kg (234 lb) 107.23 kg (236 lb 6.4 oz) 107.2 kg (236 lb 5.3 oz)    History of present illness:  See admit h and p from 8/23 for details. Briefly, pt presents with lightheadedness after standing in the heat the day of admission. Pt was admitted for further work up  Hospital Course:  Pt was continued on gentle IVF. Pt's Cr worsened overnight with baseline Cr of 2.7 to Cr of 3.23 on admit. Cr continued to worsen to 3.31 the following day. Pt's IVF was increased to 75cc/hr. Pt reports wanting to leave the hospital despite worsening renal function. He states he will follow up with his Nephrologist as scheduled this week. Pt is aware that he may decompensate if he leaves now and can even die. Despite these risks, pt still desires to leave against my medical advice for continued IVF and continued work up. Pt is aware that we do not yet have results of cardiac work up as ordered.  Discharge Exam: Filed Vitals:   02/17/14 1945 02/17/14 2000 02/17/14 2129 02/18/14 0416  BP: 155/81 152/78 162/89 154/79  Pulse: 64 65 75 70  Temp:   98.6 F (37 C) 97.7 F (36.5 C)  TempSrc:   Oral Oral  Resp: 8 15 18 18   Height:   5' 9.5" (1.765 m)   Weight:   107.23 kg (236 lb 6.4 oz) 107.2 kg (236 lb 5.3 oz)  SpO2: 100% 100% 98% 99%    General: Awake, in  nad Cardiovascular: regular, s1, s2 Respiratory: normal resp effort, no wheezing  Discharge Instructions     Medication List    ASK your doctor about these medications       aspirin 81 MG tablet  Take 162 mg by mouth daily.     atorvastatin 40 MG tablet  Commonly known as:  LIPITOR  Take 40 mg by mouth daily.     calcitRIOL 0.25 MCG capsule  Commonly known as:  ROCALTROL  Take 0.25 mcg by mouth daily.     DUREZOL 0.05 % Emul  Generic drug:  Difluprednate  Place 1 drop into the left eye every 3 (three) hours. Takes 8 times daily     folic acid 1 MG tablet  Commonly known as:  FOLVITE  Take 2 mg by mouth daily.     furosemide 80 MG tablet  Commonly known as:  LASIX  Take 80 mg by mouth daily.     Garlic [id number] MG Caps  Take 1,000 mg by mouth daily.     hydrALAZINE 50 MG tablet  Commonly known as:  APRESOLINE  Take 50 mg by mouth 2 (two) times daily.     insulin glargine 100 UNIT/ML injection  Commonly known as:  LANTUS  Inject 16 Units into the skin at bedtime.     linagliptin 5 MG Tabs tablet  Commonly  known as:  TRADJENTA  Take 5 mg by mouth daily.     nebivolol 5 MG tablet  Commonly known as:  BYSTOLIC  Take 10 mg by mouth daily.     polyethylene glycol packet  Commonly known as:  MIRALAX / GLYCOLAX  Take 17 g by mouth daily as needed for moderate constipation.     SIMBRINZA 1-0.2 % Susp  Generic drug:  Brinzolamide-Brimonidine  Place 1 drop into the left eye 3 (three) times daily.       Allergies  Allergen Reactions  . Ace Inhibitors Cough      The results of significant diagnostics from this hospitalization (including imaging, microbiology, ancillary and laboratory) are listed below for reference.    Significant Diagnostic Studies: Dg Chest 2 View  02/17/2014   CLINICAL DATA:  Near syncope.  Dizziness.  EXAM: CHEST  2 VIEW  COMPARISON:  None.  FINDINGS: Enlarged cardiac silhouette.  Clear lungs.  Mild scoliosis.  IMPRESSION: Cardiomegaly.   No acute abnormality.   Electronically Signed   By: Enrique Sack M.D.   On: 02/17/2014 17:51   US Renal  01/29/2014   CLINICAL DATA:  Stage 4 chronic kidney disease.  EXAM: RENAL/URINARY TRACT ULTRASOUND COMPLETE  COMPARISON:  None.  FINDINGS: Right Kidney:  Length: 10.2 cm. Echogenicity is normal. Benign appearing 2.4 cm cyst in the lateral aspect of the mid kidney. No hydronephrosis.  Left Kidney:  Length: 11.2 cm. Echogenicity within normal limits. No mass or hydronephrosis visualized.  Bladder:  The bladder is normal.  Prostate gland is prominent.  IMPRESSION: Prominent prostate gland. Benign appearing cyst in the right kidney. Otherwise normal.   Electronically Signed   By: Rozetta Nunnery M.D.   On: 01/29/2014 16:48    Microbiology: No results found for this or any previous visit (from the past 240 hour(s)).   Labs: Basic Metabolic Panel:  Recent Labs Lab 02/17/14 1706 02/18/14 0014  NA 136* 139  K 4.4 4.3  CL 100 102  CO2 23 25  GLUCOSE 103* 226*  BUN 40* 41*  CREATININE 3.23* 3.31*  CALCIUM 9.1 8.8  MG  --  2.5  PHOS  --  3.7   Liver Function Tests:  Recent Labs Lab 02/17/14 1706 02/18/14 0014  AST 32 29  ALT 24 22  ALKPHOS 73 72  BILITOT 0.3 0.4  PROT 7.1 6.8  ALBUMIN 3.4* 3.3*   No results found for this basename: LIPASE, AMYLASE,  in the last 168 hours No results found for this basename: AMMONIA,  in the last 168 hours CBC:  Recent Labs Lab 02/17/14 1706 02/18/14 0014  WBC 8.9 8.6  NEUTROABS 7.1  --   HGB 12.7* 12.4*  HCT 39.1 37.8*  MCV 82.8 83.4  PLT 163 184   Cardiac Enzymes:  Recent Labs Lab 02/17/14 2150 02/18/14 0014 02/18/14 0718 02/18/14 1220  TROPONINI <0.30 <0.30 <0.30 <0.30   BNP: BNP (last 3 results)  Recent Labs  02/17/14 2150  PROBNP 3134.0*   CBG:  Recent Labs Lab 02/17/14 2125 02/18/14 0733 02/18/14 1202  GLUCAP 94 78 119*       Signed:  Raejean Swinford K  [facility] 02/18/2014, 1:48 PM

## 2014-02-22 ENCOUNTER — Observation Stay (HOSPITAL_COMMUNITY)
Admission: EM | Admit: 2014-02-22 | Discharge: 2014-02-23 | Disposition: A | Discharge status: Home / Self Care | Payer: 59 | Attending: Internal Medicine | Admitting: Internal Medicine

## 2014-02-22 ENCOUNTER — Emergency Department (HOSPITAL_COMMUNITY): Payer: 59

## 2014-02-22 ENCOUNTER — Encounter (HOSPITAL_COMMUNITY): Payer: Self-pay | Admitting: Emergency Medicine

## 2014-02-22 DIAGNOSIS — I10 Essential (primary) hypertension: Secondary | ICD-10-CM | POA: Diagnosis not present

## 2014-02-22 DIAGNOSIS — Z794 Long term (current) use of insulin: Secondary | ICD-10-CM | POA: Insufficient documentation

## 2014-02-22 DIAGNOSIS — Z8673 Personal history of transient ischemic attack (TIA), and cerebral infarction without residual deficits: Secondary | ICD-10-CM | POA: Insufficient documentation

## 2014-02-22 DIAGNOSIS — Z7982 Long term (current) use of aspirin: Secondary | ICD-10-CM | POA: Insufficient documentation

## 2014-02-22 DIAGNOSIS — R778 Other specified abnormalities of plasma proteins: Secondary | ICD-10-CM

## 2014-02-22 DIAGNOSIS — R42 Dizziness and giddiness: Secondary | ICD-10-CM | POA: Diagnosis not present

## 2014-02-22 DIAGNOSIS — N184 Chronic kidney disease, stage 4 (severe): Secondary | ICD-10-CM

## 2014-02-22 DIAGNOSIS — E119 Type 2 diabetes mellitus without complications: Secondary | ICD-10-CM | POA: Diagnosis not present

## 2014-02-22 DIAGNOSIS — Z87891 Personal history of nicotine dependence: Secondary | ICD-10-CM | POA: Insufficient documentation

## 2014-02-22 DIAGNOSIS — R748 Abnormal levels of other serum enzymes: Secondary | ICD-10-CM | POA: Diagnosis not present

## 2014-02-22 DIAGNOSIS — E1121 Type 2 diabetes mellitus with diabetic nephropathy: Secondary | ICD-10-CM

## 2014-02-22 DIAGNOSIS — R5381 Other malaise: Secondary | ICD-10-CM | POA: Diagnosis present

## 2014-02-22 DIAGNOSIS — E1129 Type 2 diabetes mellitus with other diabetic kidney complication: Secondary | ICD-10-CM

## 2014-02-22 DIAGNOSIS — R55 Syncope and collapse: Principal | ICD-10-CM

## 2014-02-22 DIAGNOSIS — I5031 Acute diastolic (congestive) heart failure: Secondary | ICD-10-CM

## 2014-02-22 DIAGNOSIS — Z79899 Other long term (current) drug therapy: Secondary | ICD-10-CM | POA: Insufficient documentation

## 2014-02-22 DIAGNOSIS — N189 Chronic kidney disease, unspecified: Secondary | ICD-10-CM

## 2014-02-22 DIAGNOSIS — R9431 Abnormal electrocardiogram [ECG] [EKG]: Secondary | ICD-10-CM

## 2014-02-22 DIAGNOSIS — E1122 Type 2 diabetes mellitus with diabetic chronic kidney disease: Secondary | ICD-10-CM

## 2014-02-22 DIAGNOSIS — E785 Hyperlipidemia, unspecified: Secondary | ICD-10-CM | POA: Diagnosis present

## 2014-02-22 DIAGNOSIS — R5383 Other fatigue: Secondary | ICD-10-CM

## 2014-02-22 DIAGNOSIS — R7989 Other specified abnormal findings of blood chemistry: Secondary | ICD-10-CM

## 2014-02-22 DIAGNOSIS — I951 Orthostatic hypotension: Secondary | ICD-10-CM

## 2014-02-22 DIAGNOSIS — I5032 Chronic diastolic (congestive) heart failure: Secondary | ICD-10-CM

## 2014-02-22 LAB — CBC WITH DIFFERENTIAL/PLATELET
BASOS PCT: 0 % (ref 0–1)
Basophils Absolute: 0 10*3/uL (ref 0.0–0.1)
Eosinophils Absolute: 0.1 10*3/uL (ref 0.0–0.7)
Eosinophils Relative: 1 % (ref 0–5)
HCT: 38.1 % — ABNORMAL LOW (ref 39.0–52.0)
Hemoglobin: 12.8 g/dL — ABNORMAL LOW (ref 13.0–17.0)
Lymphocytes Relative: 14 % (ref 12–46)
Lymphs Abs: 1.2 10*3/uL (ref 0.7–4.0)
MCH: 27.7 pg (ref 26.0–34.0)
MCHC: 33.6 g/dL (ref 30.0–36.0)
MCV: 82.5 fL (ref 78.0–100.0)
Monocytes Absolute: 0.6 10*3/uL (ref 0.1–1.0)
Monocytes Relative: 7 % (ref 3–12)
NEUTROS ABS: 6.5 10*3/uL (ref 1.7–7.7)
NEUTROS PCT: 78 % — AB (ref 43–77)
PLATELETS: 180 10*3/uL (ref 150–400)
RBC: 4.62 MIL/uL (ref 4.22–5.81)
RDW: 13.1 % (ref 11.5–15.5)
WBC: 8.3 10*3/uL (ref 4.0–10.5)

## 2014-02-22 LAB — URINALYSIS, ROUTINE W REFLEX MICROSCOPIC
Bilirubin Urine: NEGATIVE
Glucose, UA: NEGATIVE mg/dL
Hgb urine dipstick: NEGATIVE
Ketones, ur: NEGATIVE mg/dL
Leukocytes, UA: NEGATIVE
NITRITE: NEGATIVE
PH: 6 (ref 5.0–8.0)
Protein, ur: >300 mg/dL — AB
Specific Gravity, Urine: 1.013 (ref 1.005–1.030)
UROBILINOGEN UA: 1 mg/dL (ref 0.0–1.0)

## 2014-02-22 LAB — URINE MICROSCOPIC-ADD ON

## 2014-02-22 LAB — BASIC METABOLIC PANEL
ANION GAP: 12 (ref 5–15)
BUN: 39 mg/dL — ABNORMAL HIGH (ref 6–23)
CO2: 24 mEq/L (ref 19–32)
Calcium: 9 mg/dL (ref 8.4–10.5)
Chloride: 103 mEq/L (ref 96–112)
Creatinine, Ser: 3.15 mg/dL — ABNORMAL HIGH (ref 0.50–1.35)
GFR, EST AFRICAN AMERICAN: 23 mL/min — AB (ref >90)
GFR, EST NON AFRICAN AMERICAN: 20 mL/min — AB (ref >90)
Glucose, Bld: 147 mg/dL — ABNORMAL HIGH (ref 70–99)
POTASSIUM: 4.5 meq/L (ref 3.7–5.3)
SODIUM: 139 meq/L (ref 137–147)

## 2014-02-22 LAB — GLUCOSE, CAPILLARY: Glucose-Capillary: 138 mg/dL — ABNORMAL HIGH (ref 70–99)

## 2014-02-22 LAB — TROPONIN I
TROPONIN I: 0.3 ng/mL — AB (ref <0.30)
Troponin I: <0.3 ng/mL (ref <0.30)

## 2014-02-22 LAB — PRO B NATRIURETIC PEPTIDE: PRO B NATRI PEPTIDE: 4126 pg/mL — AB (ref 0–125)

## 2014-02-22 MED ORDER — DIFLUPREDNATE 0.05 % OP EMUL
1.0000 [drp] | OPHTHALMIC | Status: DC
  Filled 2014-02-22: qty 5

## 2014-02-22 MED ORDER — POLYETHYLENE GLYCOL 3350 17 G PO PACK
17.0000 g | PACK | Freq: Every day | ORAL | Status: DC
  Administered 2014-02-22 – 2014-02-23 (×2): 17 g via ORAL
  Filled 2014-02-22 (×2): qty 1

## 2014-02-22 MED ORDER — ATORVASTATIN CALCIUM 40 MG PO TABS
40.0000 mg | ORAL_TABLET | Freq: Every day | ORAL | Status: DC
  Administered 2014-02-22: 40 mg via ORAL
  Filled 2014-02-22 (×2): qty 1

## 2014-02-22 MED ORDER — INSULIN GLARGINE 100 UNIT/ML ~~LOC~~ SOLN
16.0000 [IU] | Freq: Every day | SUBCUTANEOUS | Status: DC
  Administered 2014-02-22: 16 [IU] via SUBCUTANEOUS
  Filled 2014-02-22 (×2): qty 0.16

## 2014-02-22 MED ORDER — HEPARIN SODIUM (PORCINE) 5000 UNIT/ML IJ SOLN
5000.0000 [IU] | Freq: Three times a day (TID) | INTRAMUSCULAR | Status: DC
  Filled 2014-02-22 (×3): qty 1

## 2014-02-22 MED ORDER — BRIMONIDINE TARTRATE 0.2 % OP SOLN
1.0000 [drp] | Freq: Three times a day (TID) | OPHTHALMIC | Status: DC
  Filled 2014-02-22: qty 5

## 2014-02-22 MED ORDER — ASPIRIN 81 MG PO CHEW
324.0000 mg | CHEWABLE_TABLET | Freq: Once | ORAL | Status: AC
  Administered 2014-02-22: 324 mg via ORAL
  Filled 2014-02-22: qty 4

## 2014-02-22 MED ORDER — NEBIVOLOL HCL 5 MG PO TABS
5.0000 mg | ORAL_TABLET | Freq: Every day | ORAL | Status: DC
  Administered 2014-02-22 – 2014-02-23 (×2): 5 mg via ORAL
  Filled 2014-02-22 (×2): qty 1

## 2014-02-22 MED ORDER — HYDRALAZINE HCL 25 MG PO TABS
25.0000 mg | ORAL_TABLET | Freq: Two times a day (BID) | ORAL | Status: DC
  Administered 2014-02-22 – 2014-02-23 (×2): 25 mg via ORAL
  Filled 2014-02-22 (×3): qty 1

## 2014-02-22 MED ORDER — SENNOSIDES-DOCUSATE SODIUM 8.6-50 MG PO TABS
1.0000 | ORAL_TABLET | Freq: Every evening | ORAL | Status: DC | PRN
Start: 2014-02-22 — End: 2014-02-23
  Filled 2014-02-22: qty 1

## 2014-02-22 MED ORDER — LINAGLIPTIN 5 MG PO TABS
5.0000 mg | ORAL_TABLET | Freq: Every day | ORAL | Status: DC
  Administered 2014-02-22 – 2014-02-23 (×2): 5 mg via ORAL
  Filled 2014-02-22 (×2): qty 1

## 2014-02-22 MED ORDER — BRINZOLAMIDE 1 % OP SUSP
1.0000 [drp] | Freq: Three times a day (TID) | OPHTHALMIC | Status: DC
  Filled 2014-02-22: qty 10

## 2014-02-22 MED ORDER — ACETAMINOPHEN 650 MG RE SUPP: 650.0000 mg | Freq: Four times a day (QID) | RECTAL | Status: DC | PRN

## 2014-02-22 MED ORDER — CALCITRIOL 0.25 MCG PO CAPS
0.2500 ug | ORAL_CAPSULE | Freq: Every day | ORAL | Status: DC
  Administered 2014-02-22 – 2014-02-23 (×2): 0.25 ug via ORAL
  Filled 2014-02-22 (×2): qty 1

## 2014-02-22 MED ORDER — ACETAMINOPHEN 325 MG PO TABS: 650.0000 mg | ORAL_TABLET | Freq: Four times a day (QID) | ORAL | Status: DC | PRN

## 2014-02-22 MED ORDER — FOLIC ACID 1 MG PO TABS
1.0000 mg | ORAL_TABLET | Freq: Every day | ORAL | Status: DC
  Administered 2014-02-22 – 2014-02-23 (×2): 1 mg via ORAL
  Filled 2014-02-22 (×2): qty 1

## 2014-02-22 MED ORDER — ONDANSETRON HCL 4 MG PO TABS: 4.0000 mg | ORAL_TABLET | Freq: Four times a day (QID) | ORAL | Status: DC | PRN

## 2014-02-22 MED ORDER — TIMOLOL HEMIHYDRATE 0.5 % OP SOLN
1.0000 [drp] | Freq: Two times a day (BID) | OPHTHALMIC | Status: DC
  Filled 2014-02-22: qty 5

## 2014-02-22 MED ORDER — SODIUM CHLORIDE 0.9 % IV SOLN
INTRAVENOUS | Status: AC
  Administered 2014-02-22: 19:00:00 via INTRAVENOUS

## 2014-02-22 MED ORDER — NON FORMULARY: 1.0000 [drp] | Freq: Three times a day (TID) | Status: DC

## 2014-02-22 MED ORDER — ONDANSETRON HCL 4 MG/2ML IJ SOLN
4.0000 mg | Freq: Four times a day (QID) | INTRAMUSCULAR | Status: DC | PRN
Start: 2014-02-22 — End: 2014-02-23

## 2014-02-22 MED ORDER — ASPIRIN 81 MG PO TABS
162.0000 mg | ORAL_TABLET | Freq: Every day | ORAL | Status: DC
  Administered 2014-02-23: 162 mg via ORAL
  Filled 2014-02-22: qty 2

## 2014-02-22 NOTE — ED Provider Notes (Signed)
CSN: [id number]     Arrival date & time 02/22/14  1012 History   First MD Initiated Contact with Patient 02/22/14 1051     Chief Complaint  Patient presents with  . Hypotension  . Fatigue     (Consider location/radiation/quality/duration/timing/severity/associated sxs/prior Treatment) HPI Comments: Patient from PCPs office with episode of near syncope and hypotension. Stated he did not take his medications this morning and went to the nephrologist office. He was found to be orthostatic blood pressure was 80 systolic with standing [id number] with sitting. Endorses lightheadedness and fatigue. Denies any chest pain or shortness of breath. Denies any focal weakness, numbness or tingling. Denies any blood in the stool. Recent admission for syncope versus TIA with renal failure which patient left AMA. He states his blood pressure medications have been unchanged until today when his nephrologist decreased his hydralazine dose. Denies any headache, fever or cough. States compliance with medications. 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  The history is provided by the patient and the spouse.    Past Medical History  Diagnosis Date  . Hypertension   . Diabetes mellitus without complication   . TIA (transient ischemic attack)    History reviewed. No pertinent past surgical history. Family History  Problem Relation Age of Onset  . Hypertension Mother   . Diabetes type II Mother   . Transient ischemic attack Mother   . Hypertension Father   . Diabetes type II Father   . Diabetes type II Sister   . Hypertension Sister    History  Substance Use Topics  . Smoking status: Former Research scientist (life sciences)  . Smokeless tobacco: Not on file  . Alcohol Use: No    Review of Systems  Constitutional: Positive for fatigue. Negative for fever, activity change and appetite change.  HENT: Negative for congestion and rhinorrhea.   Respiratory: Negative for cough, chest tightness and  shortness of breath.   Cardiovascular: Negative for chest pain.  Gastrointestinal: Negative for nausea, vomiting and abdominal pain.  Genitourinary: Negative for dysuria and hematuria.  Musculoskeletal: Negative for arthralgias and myalgias.  Skin: Negative for wound.  Neurological: Positive for dizziness, weakness and light-headedness. Negative for headaches.  A complete 10 system review of systems was obtained and all systems are negative except as noted in the HPI and PMH.      Allergies  Ace inhibitors  Home Medications   Prior to Admission medications   Medication Sig Start Date End Date Taking? Authorizing Provider  aspirin 81 MG tablet Take 162 mg by mouth daily.   Yes Historical Provider, MD  atorvastatin (LIPITOR) 40 MG tablet Take 40 mg by mouth daily.   Yes Historical Provider, MD  Brinzolamide-Brimonidine [facility]) 1-0.2 % SUSP Place 1 drop into the left eye 3 (three) times daily.   Yes Historical Provider, MD  calcitRIOL (ROCALTROL) 0.25 MCG capsule Take 0.25 mcg by mouth daily.   Yes Historical Provider, MD  Difluprednate (DUREZOL) 0.05 % EMUL Place 1 drop into the left eye every 3 (three) hours. Takes 8 times daily   Yes Historical Provider, MD  folic acid (FOLVITE) 1 MG tablet Take 2 mg by mouth daily.   Yes Historical Provider, MD  furosemide (LASIX) 80 MG tablet Take 80 mg by mouth daily.   Yes Historical Provider, MD  Garlic [id number] MG CAPS Take 1,000 mg by mouth daily.   Yes Historical Provider, MD  hydrALAZINE (APRESOLINE) 50 MG tablet Take 50 mg by mouth 2 (  two) times daily.   Yes Historical Provider, MD  insulin glargine (LANTUS) 100 UNIT/ML injection Inject 16 Units into the skin at bedtime.   Yes Historical Provider, MD  linagliptin (TRADJENTA) 5 MG TABS tablet Take 5 mg by mouth daily.   Yes Historical Provider, MD  nebivolol (BYSTOLIC) 5 MG tablet Take 5 mg by mouth daily.    Yes Historical Provider, MD  NON FORMULARY 1 each by Other route See admin  instructions. Use CPAP half of the night.   Yes Historical Provider, MD  Omega-3 Fatty Acids (FISH OIL) 500 MG CAPS Take 500 mg by mouth daily.   Yes Historical Provider, MD  polyethylene glycol (MIRALAX / GLYCOLAX) packet Take 17 g by mouth daily.    Yes Historical Provider, MD  timolol (BETIMOL) 0.5 % ophthalmic solution Place 1 drop into the left eye 2 (two) times daily.   Yes Historical Provider, MD   BP 167/88  Pulse 68  Temp(Src) 98.9 F (37.2 C) (Oral)  Resp 15  SpO2 100% Physical Exam  Nursing note and vitals reviewed. Constitutional: He is oriented to person, place, and time. He appears well-developed and well-nourished. No distress.  Flat affect. withdrawn  HENT:  Head: Normocephalic and atraumatic.  Mouth/Throat: Oropharynx is clear and moist. No oropharyngeal exudate.  Eyes: Conjunctivae and EOM are normal. Pupils are equal, round, and reactive to light.  Neck: Normal range of motion. Neck supple.  No meningismus.  Cardiovascular: Normal rate, regular rhythm, normal heart sounds and intact distal pulses.   No murmur heard. Pulmonary/Chest: Effort normal and breath sounds normal. No respiratory distress. He has no wheezes. He has no rales.  Abdominal: Soft. There is no tenderness. There is no rebound and no guarding.  Musculoskeletal: Normal range of motion. He exhibits no edema and no tenderness.  Neurological: He is alert and oriented to person, place, and time. No cranial nerve deficit. He exhibits normal muscle tone. Coordination normal.  L ptosis, chronic per report. No ataxia on finger to nose bilaterally. No pronator drift. 5/5 strength throughout. CN 2-12 intact. Negative Romberg. Equal grip strength. Sensation intact. Gait is normal.   Skin: Skin is warm.  Psychiatric: He has a normal mood and affect. His behavior is normal.    ED Course  Procedures (including critical care time) Labs Review Labs Reviewed  CBC WITH DIFFERENTIAL - Abnormal; Notable for the  following:    Hemoglobin 12.8 (*)    HCT 38.1 (*)    Neutrophils Relative % 78 (*)    All other components within normal limits  BASIC METABOLIC PANEL - Abnormal; Notable for the following:    Glucose, Bld 147 (*)    BUN 39 (*)    Creatinine, Ser 3.15 (*)    GFR calc non Af Amer 20 (*)    GFR calc Af Amer 23 (*)    All other components within normal limits  TROPONIN I - Abnormal; Notable for the following:    Troponin I 0.30 (*)    All other components within normal limits  URINALYSIS, ROUTINE W REFLEX MICROSCOPIC - Abnormal; Notable for the following:    Protein, ur >300 (*)    All other components within normal limits  PRO B NATRIURETIC PEPTIDE - Abnormal; Notable for the following:    Pro B Natriuretic peptide (BNP) 4126.0 (*)    All other components within normal limits  URINE MICROSCOPIC-ADD ON  BASIC METABOLIC PANEL  TROPONIN I  TROPONIN I    Imaging Review  Ct Head Wo Contrast  02/22/2014   CLINICAL DATA:  Fatigue, weakness, elevated blood pressure, syncope  EXAM: CT HEAD WITHOUT CONTRAST  TECHNIQUE: Contiguous axial images were obtained from the base of the skull through the vertex without intravenous contrast.  COMPARISON:  03/08/2010  FINDINGS: Severe diffuse cortical atrophy. Severe low attenuation in the deep white matter. The severity of deep white matter attenuation change has increased when compared to prior study, as has the degree of atrophy. There is no evidence of acute vascular territory infarct. There is no hemorrhage or extra-axial fluid. No hydrocephalus.  The left maxillary sinus is completely opacified, and is also mildly expanded. Mild mucosal thickening right maxillary sinus. Sphenoid, ethmoid, and frontal sinuses are clear. Calvarium is intact.  IMPRESSION: Complete opacification and expansion of left maxillary sinus suggesting severe chronic sinusitis.  Severe chronic ischemic change, progressive when compared to 2011.   Electronically Signed   By: Skipper Cliche M.D.   On: 02/22/2014 12:32   Dg Chest Portable 1 View  02/22/2014   CLINICAL DATA:  Fatigue and hypotension  EXAM: PORTABLE CHEST - 1 VIEW  COMPARISON:  PA and lateral chest of February 17, 2014  FINDINGS: The lungs are reasonably well inflated and clear. The cardiopericardial silhouette is enlarged. The pulmonary vascularity is prominent centrally but there is no cephalization. There is no pleural effusion. The bony thorax is unremarkable. There is curvature of the thoracic spine with the convexity towards the right. There is mild degenerative change of the right AC joint.  IMPRESSION: There is chronic enlargement of the cardiac silhouette which may reflect low-grade compensated CHF. There is no pulmonary edema or pneumonia.   Electronically Signed   By: David  Martinique   On: 02/22/2014 13:33     EKG Interpretation   Date/Time:  Friday February 22 2014 10:38:30 EDT Ventricular Rate:  61 PR Interval:  180 QRS Duration: 80 QT Interval:  428 QTC Calculation: 430 R Axis:   89 Text Interpretation:  Normal sinus rhythm ST \\T \ T wave abnormality,  consider inferior ischemia Abnormal ECG No significant change was found  Confirmed by Wyvonnia Dusky  MD, Meela Wareing ([id number]) on 02/22/2014 11:08:33 AM      MDM   Final diagnoses:  Near syncope  Elevated troponin   From PCPs office with episode of near syncope. Recent admission for renal failure and TIA. No focal neurodeficits. Left ptosis unchanged  EKG unchanged. Orthostatics negative here. Cr 3.1 which is stable. Mild CHF with elevated BNP. No hypoxia or increased work of breathing. Troponin 0.3, EKG unchanged, no chest pain.   Likely not significant in setting of CKD.  Concern for TIA/CVA, orthostatics hypotension and syncope.  Patient left AMA during previous admission but agrees to stay today. D/w Dr. Conley Canal.  Cardiology consulted.    Ezequiel Essex, MD 02/22/14 [phone]

## 2014-02-22 NOTE — Consult Note (Signed)
CARDIOLOGY CONSULT NOTE   Patient ID: Gregory Hood MRN: [id number] DOB/AGE: April 27, 1951 63 y.o.  Admit date: 02/22/2014  Primary Physician   No primary provider on file. Primary Cardiologist   Dr. Johnsie Cancel  Reason for Consultation   Syncope and mild elevated troponin (0.3)  HPI: Gregory Hood is a 63 y.o. male with a history of diabetes, CKD, CVA, hypertension, severe LVH, recent admission for pre-syncope and AKI and left AMA who presented to [facility] today for near syncope and hypotension. His troponin was noted to be elevated ( at 0.30).  Patient presented from his nephrologist's office with episode of near syncope and hypotension. Last night he forgot to take his BP pills and so he took them this AM (he only knows that one was a HCTZ). Stated he did not take his other scheduled medications this morning because he felt a "little woozy" and went to the nephrologist office. He was found to be orthostatic blood pressure was 80 systolic with standing [id number] with sitting. Endorses lightheadedness and fatigue. Denies any chest pain or shortness of breath. Denies any focal weakness, numbness or tingling. Denies any blood in the stool. Recent admission for syncope versus TIA with renal failure which patient left AMA. He states his blood pressure medications have been unchanged until today when his nephrologist decreased his hydralazine dose. Denies any headache, fever or cough. States compliance with medications. 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   Past Medical History  Diagnosis Date  . Hypertension   . Diabetes mellitus without complication   . TIA (transient ischemic attack)      History reviewed. No pertinent past surgical history.  Allergies  Allergen Reactions  . Ace Inhibitors Cough    I have reviewed the patient's current medications       Prior to Admission medications   Medication Sig Start Date End Date Taking? Authorizing Provider    aspirin 81 MG tablet Take 162 mg by mouth daily.   Yes Historical Provider, MD  atorvastatin (LIPITOR) 40 MG tablet Take 40 mg by mouth daily.   Yes Historical Provider, MD  Brinzolamide-Brimonidine [facility]) 1-0.2 % SUSP Place 1 drop into the left eye 3 (three) times daily.   Yes Historical Provider, MD  calcitRIOL (ROCALTROL) 0.25 MCG capsule Take 0.25 mcg by mouth daily.   Yes Historical Provider, MD  Difluprednate (DUREZOL) 0.05 % EMUL Place 1 drop into the left eye every 3 (three) hours. Takes 8 times daily   Yes Historical Provider, MD  folic acid (FOLVITE) 1 MG tablet Take 2 mg by mouth daily.   Yes Historical Provider, MD  furosemide (LASIX) 80 MG tablet Take 80 mg by mouth daily.   Yes Historical Provider, MD  Garlic [id number] MG CAPS Take 1,000 mg by mouth daily.   Yes Historical Provider, MD  hydrALAZINE (APRESOLINE) 50 MG tablet Take 50 mg by mouth 2 (two) times daily.   Yes Historical Provider, MD  insulin glargine (LANTUS) 100 UNIT/ML injection Inject 16 Units into the skin at bedtime.   Yes Historical Provider, MD  linagliptin (TRADJENTA) 5 MG TABS tablet Take 5 mg by mouth daily.   Yes Historical Provider, MD  nebivolol (BYSTOLIC) 5 MG tablet Take 5 mg by mouth daily.    Yes Historical Provider, MD  NON FORMULARY 1 each by Other route See admin instructions. Use CPAP half of the night.   Yes Historical Provider, MD  Omega-3 Fatty  Acids (FISH OIL) 500 MG CAPS Take 500 mg by mouth daily.   Yes Historical Provider, MD  polyethylene glycol (MIRALAX / GLYCOLAX) packet Take 17 g by mouth daily.    Yes Historical Provider, MD  timolol (BETIMOL) 0.5 % ophthalmic solution Place 1 drop into the left eye 2 (two) times daily.   Yes Historical Provider, MD     History   Social History  . Marital Status: Single    Spouse Name: N/A    Number of Children: N/A  . Years of Education: N/A   Occupational History  . Not on file.   Social History Main Topics  . Smoking status: Former Research scientist (life sciences)   . Smokeless tobacco: Not on file  . Alcohol Use: No  . Drug Use: No  . Sexual Activity: Not on file   Other Topics Concern  . Not on file   Social History Narrative  . No narrative on file    Family Status  Relation Status Death Age  . Mother Deceased   . Father Alive   . Sister Alive   . Sister Alive   . Sister Alive    Family History  Problem Relation Age of Onset  . Hypertension Mother   . Diabetes type II Mother   . Transient ischemic attack Mother   . Hypertension Father   . Diabetes type II Father   . Diabetes type II Sister   . Hypertension Sister      ROS:  Full 14 point review of systems complete and found to be negative unless listed above.  Physical Exam: Blood pressure 164/93, pulse 65, temperature 98.5 F (36.9 C), temperature source Oral, resp. rate 12, SpO2 99.00%.  General: Well developed, well nourished, male in no acute distress Head: Eyes PERRLA, No xanthomas.   Normocephalic and atraumatic, oropharynx without edema or exudate. :  Lungs: CTAB decreased breath sounds at bases Heart: HRRR S1 S2, no rub/gallop, Heart irregular rate and rhythm with S1, S2  murmur. pulses are 2+ extrem.   Neck: No carotid bruits. No lymphadenopathy.  JVD. Abdomen: Bowel sounds present, abdomen soft and non-tender without masses or hernias noted. Msk:  No spine or cva tenderness. No weakness, no joint deformities or effusions. Extremities: No clubbing or cyanosis. +  Edema. L>R (chronically)   Neuro: Alert and oriented X 3. No focal deficits noted. Psych:  Good affect, responds appropriately Skin: No rashes or lesions noted.  Labs:   Lab Results  Component Value Date   WBC 8.3 02/22/2014   HGB 12.8* 02/22/2014   HCT 38.1* 02/22/2014   MCV 82.5 02/22/2014   PLT 180 02/22/2014      Recent Labs Lab 02/18/14 0014 02/22/14 1140  NA 139 139  K 4.3 4.5  CL 102 103  CO2 25 24  BUN 41* 39*  CREATININE 3.31* 3.15*  CALCIUM 8.8 9.0  PROT 6.8  --   BILITOT 0.4  --    ALKPHOS 72  --   ALT 22  --   AST 29  --   GLUCOSE 226* 147*  ALBUMIN 3.3*  --    Magnesium  Date Value Ref Range Status  02/18/2014 2.5  1.5 - 2.5 mg/dL Final    Recent Labs  02/22/14 1108  TROPONINI 0.30*    Pro B Natriuretic peptide (BNP)  Date/Time Value Ref Range Status  02/22/2014 11:08 AM 4126.0* 0 - 125 pg/mL Final  02/17/2014  9:50 PM 3134.0* 0 - 125 pg/mL Final  Lab Results  Component Value Date   CHOL 156 02/18/2014   HDL 38* 02/18/2014   LDLCALC 97 02/18/2014   TRIG 105 02/18/2014   Lab Results  Component Value Date   DDIMER 0.55* 02/17/2014    TSH  Date/Time Value Ref Range Status  02/18/2014 12:14 AM 2.050  0.350 - 4.500 uIU/mL Final     Echo: none  ECG: HR 61. Normal sinus rhythm ST & T wave abnormality, consider inferior ischemia   Radiology:  Ct Head Wo Contrast  02/22/2014   CLINICAL DATA:  Fatigue, weakness, elevated blood pressure, syncope  EXAM: CT HEAD WITHOUT CONTRAST  TECHNIQUE: Contiguous axial images were obtained from the base of the skull through the vertex without intravenous contrast.  COMPARISON:  03/08/2010  FINDINGS: Severe diffuse cortical atrophy. Severe low attenuation in the deep white matter. The severity of deep white matter attenuation change has increased when compared to prior study, as has the degree of atrophy. There is no evidence of acute vascular territory infarct. There is no hemorrhage or extra-axial fluid. No hydrocephalus.  The left maxillary sinus is completely opacified, and is also mildly expanded. Mild mucosal thickening right maxillary sinus. Sphenoid, ethmoid, and frontal sinuses are clear. Calvarium is intact.  IMPRESSION: Complete opacification and expansion of left maxillary sinus suggesting severe chronic sinusitis.  Severe chronic ischemic change, progressive when compared to 2011.     Dg Chest Portable 1 View  02/22/2014   CLINICAL DATA:  Fatigue and hypotension  EXAM: PORTABLE CHEST - 1 VIEW  COMPARISON:  PA  and lateral chest of February 17, 2014  FINDINGS: The lungs are reasonably well inflated and clear. The cardiopericardial silhouette is enlarged. The pulmonary vascularity is prominent centrally but there is no cephalization. There is no pleural effusion. The bony thorax is unremarkable. There is curvature of the thoracic spine with the convexity towards the right. There is mild degenerative change of the right AC joint.  IMPRESSION: There is chronic enlargement of the cardiac silhouette which may reflect low-grade compensated CHF. There is no pulmonary edema or pneumonia.    ASSESSMENT AND PLAN:    Active Problems:   Lightheadedness   Hypertension   Diabetes mellitus   CKD (chronic kidney disease), stage IV   Dehydration   Abnormal ECG    Gregory Hood is a 63 y.o. male with a history of diabetes, CKD, CVA, hypertension, severe LVH, recent admission for pre-syncope and AKI and left AMA who presented to [facility] today for near syncope and hypotension. His troponin was noted to be elevated ( at 0.30) and cardiology was consulted.  Presyncope- likely due to orthostatic hypotension from dysautonomia due to long standing DM, but may need a monitor. See MD note. -- Consider compression stockings.   Acute diastolic CHF. LE edema (L>R), possible mild CHF on CXR and BNP elevated >4K. Recently discharged 4 days ago for IVF for worsening renal function and pre-syncope.  -- He denies SOB, orthopnea, PND.  -- Bedside ECHO with LVH and normal systolic function. EF 60%. Normal RV.  -- Was on Lasix 80mg  qd as an outpatient.  Will likely need to scale back with orthostatic hypotension  Elevated troponin- Very mild (0.3) and non-specific in the setting of CKD stage IV. -- Will continue to cycle enzymes. No CP  CKD- stage IV. Creat stable from last admission.   HTN- not well controlled. Did not take his AM meds. Resume home regimen  LE edema- L>R. LE dopplers today neg for  DVT   Signed: Crista Luria 02/22/2014 1:46 PM  Pager [phone]  Co-Sign MD  Patient seen and examined with Angelena Form, PA-C. We discussed all aspects of the encounter. I agree with the assessment and plan as stated above.   Echo performed personally at bedside LVEF 60% + severe LVH. RV normal.  Suspect presyncope related to orthostatic hypotension due to autonomic dysfunction in setting of longstanding DM2. Given resting HTN and diastolic HF management will be quite a challenge. Would start with conservative measures such as compression hose and isometrics prior to standing. Would also liberalize fluid status (i.e. Cut back diuretics) as tolerated from HF standpoint. Had event montior at [place] last year as part of w/u for stroke which was negative.   Verl Whitmore,MD 3:50 PM

## 2014-02-22 NOTE — H&P (Signed)
[facility] History and Physical  RYOMA PACKETT [id number] DOB: 1950-10-14 DOA: 02/22/2014  Referring physician: EDP PCP: Jilda Panda   Chief Complaint: low blood pressure  HPI: Gregory Hood is a 63 y.o. male  With h/o DM2, CKD, htn, chronically occluded right vertebral artery, recently left hospital AMA for presyncope and worsening renal function.  Sent to ED from Dr. Sanda Klein office for orthostatic hypotension. SBP reportedly dropped to 80 systolic.  Patient denies syncope, weakness, dizziness, chest pain, palpitations, dyspnea, orthopnea. Has chronic left greater than right leg edema.  He has no complaints currently In ED, CT brain shows nothing acute, chronic sinusitis. EKG abnormal, but unchanged from previous. Troponin 0.3. Pro BNP >4000.  Pro BNP a few days ago >3000.  Creatinine 3.15.  Per patient, Dr. Lorrene Reid has discussed the need for referral to vascular surgeon for AVF and eventual dialysis.  In ED, orthostatic blood pressure did not drop.  Seen by cardiology who performed bedside echo which showed normal EF, LVH. They feel patients symptoms from orthostatic hypotension from dysautonomia and recommend compression stockings and decreasing lasix as tolerated.  Per notes, Dr. Lorrene Reid also recommended decreasing hydralazine dose.  Doppler left leg today negative for DVT. UA negative for infection.  During previous admission, doppler carotids showed 1-39% ICA stenosis, right vertebral not visualized  Review of Systems:  Systems reviewed. As above, otherwise negative  Past Medical History  Diagnosis Date  . Hypertension   . Diabetes mellitus without complication   . TIA (transient ischemic attack)    History reviewed. No pertinent past surgical history. Social History:  reports that he has quit smoking. He does not have any smokeless tobacco history on file. He reports that he does not drink alcohol or use illicit drugs.  Allergies  Allergen Reactions  . Ace Inhibitors Cough     Family History  Problem Relation Age of Onset  . Hypertension Mother   . Diabetes type II Mother   . Transient ischemic attack Mother   . Hypertension Father   . Diabetes type II Father   . Diabetes type II Sister   . Hypertension Sister      Prior to Admission medications   Medication Sig Start Date End Date Taking? Authorizing Provider  aspirin 81 MG tablet Take 162 mg by mouth daily.   Yes Historical Provider, MD  atorvastatin (LIPITOR) 40 MG tablet Take 40 mg by mouth daily.   Yes Historical Provider, MD  Brinzolamide-Brimonidine [facility]) 1-0.2 % SUSP Place 1 drop into the left eye 3 (three) times daily.   Yes Historical Provider, MD  calcitRIOL (ROCALTROL) 0.25 MCG capsule Take 0.25 mcg by mouth daily.   Yes Historical Provider, MD  Difluprednate (DUREZOL) 0.05 % EMUL Place 1 drop into the left eye every 3 (three) hours. Takes 8 times daily   Yes Historical Provider, MD  folic acid (FOLVITE) 1 MG tablet Take 2 mg by mouth daily.   Yes Historical Provider, MD  furosemide (LASIX) 80 MG tablet Take 80 mg by mouth daily.   Yes Historical Provider, MD  Garlic [id number] MG CAPS Take 1,000 mg by mouth daily.   Yes Historical Provider, MD  hydrALAZINE (APRESOLINE) 50 MG tablet Take 50 mg by mouth 2 (two) times daily.   Yes Historical Provider, MD  insulin glargine (LANTUS) 100 UNIT/ML injection Inject 16 Units into the skin at bedtime.   Yes Historical Provider, MD  linagliptin (TRADJENTA) 5 MG TABS tablet Take 5 mg by mouth daily.  Yes Historical Provider, MD  nebivolol (BYSTOLIC) 5 MG tablet Take 5 mg by mouth daily.    Yes Historical Provider, MD  NON FORMULARY 1 each by Other route See admin instructions. Use CPAP half of the night.   Yes Historical Provider, MD  Omega-3 Fatty Acids (FISH OIL) 500 MG CAPS Take 500 mg by mouth daily.   Yes Historical Provider, MD  polyethylene glycol (MIRALAX / GLYCOLAX) packet Take 17 g by mouth daily.    Yes Historical Provider, MD  timolol  (BETIMOL) 0.5 % ophthalmic solution Place 1 drop into the left eye 2 (two) times daily.   Yes Historical Provider, MD   Physical Exam: Filed Vitals:   02/22/14 1400 02/22/14 1430 02/22/14 1515 02/22/14 1530  BP: 185/98 181/102 157/83 166/83  Pulse: 64 67 65 63  Temp:      TempSrc:      Resp: 12 9 14 14   SpO2: 100% 100% 100% 100%    Wt Readings from Last 3 Encounters:  02/18/14 107.2 kg (236 lb 5.3 oz)  BP 167/88  Pulse 68  Temp(Src) 98.9 F (37.2 C) (Oral)  Resp 15  SpO2 100%  General Appearance:    Alert, cooperative, no distress, appears stated age  Head:    Normocephalic, without obvious abnormality, atraumatic  Eyes:    PERRL, left eye red with mild ptosis, which is reportedly chronic          Nose:   Nares normal, septum midline, mucosa normal, no drainage   or sinus tenderness  Throat:   Lips, mucosa, and tongue normal; teeth and gums normal  Neck:   Supple, symmetrical, trachea midline, no adenopathy;       thyroid:  No enlargement/tenderness/nodules; no carotid   bruit or JVD  Back:     Symmetric, no curvature, ROM normal, no CVA tenderness  Lungs:     Clear to auscultation bilaterally, respirations unlabored  Chest wall:    No tenderness or deformity  Heart:    Regular rate and rhythm, S1 and S2 normal, no murmur, rub   or gallop  Abdomen:     Soft, non-tender, bowel sounds active all four quadrants,    no masses, no organomegaly  Genitalia:    deferred  Rectal:   deferred  Extremities:   1-2+ edema on left, trace on right, no CC  Pulses:   2+ and symmetric all extremities  Skin:   Skin color, texture, turgor normal, no rashes or lesions  Lymph nodes:   Cervical, supraclavicular, and axillary nodes normal  Neurologic:   CNII-XII intact. Normal strength, sensation and reflexes      throughout           Psych flat affect. Cooperative and calm  Labs on Admission:  Basic Metabolic Panel:  Recent Labs Lab 02/17/14 1706 02/18/14 0014 02/22/14 1140  NA  136* 139 139  K 4.4 4.3 4.5  CL 100 102 103  CO2 23 25 24   GLUCOSE 103* 226* 147*  BUN 40* 41* 39*  CREATININE 3.23* 3.31* 3.15*  CALCIUM 9.1 8.8 9.0  MG  --  2.5  --   PHOS  --  3.7  --    Liver Function Tests:  Recent Labs Lab 02/17/14 1706 02/18/14 0014  AST 32 29  ALT 24 22  ALKPHOS 73 72  BILITOT 0.3 0.4  PROT 7.1 6.8  ALBUMIN 3.4* 3.3*   No results found for this basename: LIPASE, AMYLASE,  in the last 168  hours No results found for this basename: AMMONIA,  in the last 168 hours CBC:  Recent Labs Lab 02/17/14 1706 02/18/14 0014 02/22/14 1140  WBC 8.9 8.6 8.3  NEUTROABS 7.1  --  6.5  HGB 12.7* 12.4* 12.8*  HCT 39.1 37.8* 38.1*  MCV 82.8 83.4 82.5  PLT 163 184 180   Cardiac Enzymes:  Recent Labs Lab 02/17/14 2150 02/18/14 0014 02/18/14 0718 02/18/14 1220 02/22/14 1108  TROPONINI <0.30 <0.30 <0.30 <0.30 0.30*    BNP (last 3 results)  Recent Labs  02/17/14 2150 02/22/14 1108  PROBNP 3134.0* 4126.0*   CBG:  Recent Labs Lab 02/17/14 2125 02/18/14 0733 02/18/14 1202  GLUCAP 94 78 119*    Radiological Exams on Admission: Ct Head Wo Contrast  02/22/2014   CLINICAL DATA:  Fatigue, weakness, elevated blood pressure, syncope  EXAM: CT HEAD WITHOUT CONTRAST  TECHNIQUE: Contiguous axial images were obtained from the base of the skull through the vertex without intravenous contrast.  COMPARISON:  03/08/2010  FINDINGS: Severe diffuse cortical atrophy. Severe low attenuation in the deep white matter. The severity of deep white matter attenuation change has increased when compared to prior study, as has the degree of atrophy. There is no evidence of acute vascular territory infarct. There is no hemorrhage or extra-axial fluid. No hydrocephalus.  The left maxillary sinus is completely opacified, and is also mildly expanded. Mild mucosal thickening right maxillary sinus. Sphenoid, ethmoid, and frontal sinuses are clear. Calvarium is intact.  IMPRESSION:  Complete opacification and expansion of left maxillary sinus suggesting severe chronic sinusitis.  Severe chronic ischemic change, progressive when compared to 2011.   Electronically Signed   By: Skipper Cliche M.D.   On: 02/22/2014 12:32   Dg Chest Portable 1 View  02/22/2014   CLINICAL DATA:  Fatigue and hypotension  EXAM: PORTABLE CHEST - 1 VIEW  COMPARISON:  PA and lateral chest of February 17, 2014  FINDINGS: The lungs are reasonably well inflated and clear. The cardiopericardial silhouette is enlarged. The pulmonary vascularity is prominent centrally but there is no cephalization. There is no pleural effusion. The bony thorax is unremarkable. There is curvature of the thoracic spine with the convexity towards the right. There is mild degenerative change of the right AC joint.  IMPRESSION: There is chronic enlargement of the cardiac silhouette which may reflect low-grade compensated CHF. There is no pulmonary edema or pneumonia.   Electronically Signed   By: David  Martinique   On: 02/22/2014 13:33    EKG: Normal sinus rhythm ST & T wave abnormality, consider inferior ischemia Unchanged from previous  Assessment/Plan Principal Problem:   Orthostatic hypotension: hold lasix. IVF overnight. Decrease hydralazine. Recheck in am. Compression hose Active Problems:   Essential hypertension, benign   DM (diabetes mellitus), type 2 with renal complications: resume outpatient meds   CKD (chronic kidney disease): per patient report, Dr. Lorrene Reid discussing AVF, so likely progression of CKD    Abnormal ECG: unchanged from previous   Elevated troponin: no CP.  Likely related to renal failure. Observation on tele overnight. Cycle enzymes. Already seen by [facility]   Chronic diastolic heart failure: see above. No evidence of acute decompensation   Other and unspecified hyperlipidemia OSA: CPAP qhs  Code Status: full Disposition Plan: obs, tele  Time spent:60 min  [facility] Pager [phone]

## 2014-02-22 NOTE — ED Notes (Signed)
Admitting MD at bedside.

## 2014-02-22 NOTE — ED Notes (Signed)
Pt reports forgetting to take bp meds last night so he took them this am, went to renal dr appt and they sent him here for eval due to fatigue, weakness, hypotension and pt feeling lightheaded and unsteady. Pt reports he thinks it due to taking bp meds this am. bp is 169/83 at triage. Denies feeling bad yesterday, just appears fatigued at triage but is awake, a&ox4.

## 2014-02-23 DIAGNOSIS — N058 Unspecified nephritic syndrome with other morphologic changes: Secondary | ICD-10-CM

## 2014-02-23 DIAGNOSIS — R55 Syncope and collapse: Principal | ICD-10-CM

## 2014-02-23 DIAGNOSIS — R9431 Abnormal electrocardiogram [ECG] [EKG]: Secondary | ICD-10-CM

## 2014-02-23 LAB — BASIC METABOLIC PANEL
ANION GAP: 10 (ref 5–15)
BUN: 37 mg/dL — ABNORMAL HIGH (ref 6–23)
CO2: 26 mEq/L (ref 19–32)
Calcium: 8.9 mg/dL (ref 8.4–10.5)
Chloride: 108 mEq/L (ref 96–112)
Creatinine, Ser: 3.05 mg/dL — ABNORMAL HIGH (ref 0.50–1.35)
GFR calc Af Amer: 24 mL/min — ABNORMAL LOW (ref >90)
GFR calc non Af Amer: 21 mL/min — ABNORMAL LOW (ref >90)
Glucose, Bld: 106 mg/dL — ABNORMAL HIGH (ref 70–99)
Potassium: 4.4 mEq/L (ref 3.7–5.3)
Sodium: 144 mEq/L (ref 137–147)

## 2014-02-23 LAB — TROPONIN I: Troponin I: <0.3 ng/mL (ref <0.30)

## 2014-02-23 LAB — GLUCOSE, CAPILLARY: GLUCOSE-CAPILLARY: 143 mg/dL — AB (ref 70–99)

## 2014-02-23 MED ORDER — ISOSORBIDE MONONITRATE ER 30 MG PO TB24: 30.0000 mg | ORAL_TABLET | Freq: Every day | ORAL | Status: DC

## 2014-02-23 MED ORDER — ISOSORBIDE MONONITRATE ER 30 MG PO TB24
30.0000 mg | ORAL_TABLET | Freq: Every day | ORAL | Status: DC
  Administered 2014-02-23: 30 mg via ORAL
  Filled 2014-02-23 (×2): qty 1

## 2014-02-23 MED ORDER — FUROSEMIDE 40 MG PO TABS: 40.0000 mg | ORAL_TABLET | Freq: Every day | ORAL | Status: DC

## 2014-02-23 NOTE — Progress Notes (Signed)
UR Completed.  Gregory Hood [phone] 02/23/2014  

## 2014-02-23 NOTE — Progress Notes (Signed)
Per Dr. Meda Coffee, needs f/u with Dr. Johnsie Cancel. I have left a message on our office's scheduling voicemail requesting a follow-up appointment, and our office will call the patient with this appointment. Sham Alviar PA-C

## 2014-02-23 NOTE — Discharge Summary (Signed)
Physician Discharge Summary  ISMAEL MASEDA [id number] DOB: 1951-04-06 DOA: 02/22/2014  PCP: Jilda Panda, MD  Admit date: 02/22/2014 Discharge date: 02/23/2014  Time spent: 35 minutes  Recommendations for Outpatient Follow-up:  1. Please follow up on BMP, his Lasix was decreased from 80 mg by mouth daily to 40 mg by mouth daily due to orthostatic hypotension  Discharge Diagnoses:  Principal Problem:   Orthostatic hypotension Active Problems:   Essential hypertension, benign   DM (diabetes mellitus), type 2 with renal complications   CKD (chronic kidney disease), stage IV   Abnormal ECG   Elevated troponin   Chronic diastolic heart failure   Other and unspecified hyperlipidemia   Discharge Condition: Stable/improved  Diet recommendation: Heart healthy diet  There were no vitals filed for this visit.  History of present illness:  Gregory Hood is a 63 y.o. male  With h/o DM2, CKD, htn, chronically occluded right vertebral artery, recently left hospital AMA for presyncope and worsening renal function. Sent to ED from Dr. Sanda Klein office for orthostatic hypotension. SBP reportedly dropped to 80 systolic. Patient denies syncope, weakness, dizziness, chest pain, palpitations, dyspnea, orthopnea. Has chronic left greater than right leg edema. He has no complaints currently In ED, CT brain shows nothing acute, chronic sinusitis. EKG abnormal, but unchanged from previous. Troponin 0.3. Pro BNP >4000. Pro BNP a few days ago >3000. Creatinine 3.15. Per patient, Dr. Lorrene Reid has discussed the need for referral to vascular surgeon for AVF and eventual dialysis. In ED, orthostatic blood pressure did not drop. Seen by cardiology who performed bedside echo which showed normal EF, LVH. They feel patients symptoms from orthostatic hypotension from dysautonomia and recommend compression stockings and decreasing lasix as tolerated. Per notes, Dr. Lorrene Reid also recommended decreasing hydralazine dose. Doppler  left leg today negative for DVT. UA negative for infection.  During previous admission, doppler carotids showed 1-39% ICA stenosis, right vertebral not visualized   Hospital Course:  Patient is a pleasant 63 year old woman with a past history of stage IV chronic kidney disease, type 2 diabetes mellitus, admitted to the medicine service on 02/22/2014. He was referred to the emergency department at [facility] from Dr.Dunham's office for orthostatic hypotension, likely secondary to diuretic therapy. During this hospitalization he was seen and evaluated by cardiology. A bedside transthoracic echocardiogram showed LVH with ejection fraction of 60%. Patient was admitted to telemetry where her troponins were cycled and remained negative x3 sets. Lab work revealed an elevated BNP of 4126 as chest x-ray did not reveal pulmonary edema or pneumonia. Cardiology started patient on Imdur 30 mg by mouth daily. He had been taking 80 mg by mouth daily as an outpatient for which cardiology recommended decreasing this dose. He was discharged on 40 mg by mouth daily. Patient discharged to his home in stable condition on 02/23/2014. Will followup with cardiology in the office.   Consultations:  Cardiology  Discharge Exam: Filed Vitals:   02/23/14 1323  BP: 158/82  Pulse: 64  Temp: 98.5 F (36.9 C)  Resp: 16    General: Patient is in no acute distress, states feeling well, is asking to go home this afternoon. He ambulated down the hallway and back without any issues Cardiovascular: Regular rate and rhythm normal S1-S2 Respiratory: Lungs overall clear to auscultation bilaterally, normal respiratory effort Abdomen: Soft nontender nondistended Extremities: Has bilateral pedal edema  Discharge Instructions You were cared for by a hospitalist during your hospital stay. If you have any questions about your discharge medications  or the care you received while you were in the hospital after you are  discharged, you can call the unit and asked to speak with the hospitalist on call if the hospitalist that took care of you is not available. Once you are discharged, your primary care physician will handle any further medical issues. Please note that NO REFILLS for any discharge medications will be authorized once you are discharged, as it is imperative that you return to your primary care physician (or establish a relationship with a primary care physician if you do not have one) for your aftercare needs so that they can reassess your need for medications and monitor your lab values.  Discharge Instructions   Call MD for:  difficulty breathing, headache or visual disturbances    Complete by:  As directed      Call MD for:  extreme fatigue    Complete by:  As directed      Call MD for:  hives    Complete by:  As directed      Call MD for:  persistant dizziness or light-headedness    Complete by:  As directed      Call MD for:  persistant nausea and vomiting    Complete by:  As directed      Call MD for:  redness, tenderness, or signs of infection (pain, swelling, redness, odor or green/yellow discharge around incision site)    Complete by:  As directed      Call MD for:  severe uncontrolled pain    Complete by:  As directed      Call MD for:  temperature >100.4    Complete by:  As directed      Diet - low sodium heart healthy    Complete by:  As directed      Increase activity slowly    Complete by:  As directed             Medication List    STOP taking these medications       NON FORMULARY      TAKE these medications       aspirin 81 MG tablet  Take 162 mg by mouth daily.     atorvastatin 40 MG tablet  Commonly known as:  LIPITOR  Take 40 mg by mouth daily.     calcitRIOL 0.25 MCG capsule  Commonly known as:  ROCALTROL  Take 0.25 mcg by mouth daily.     DUREZOL 0.05 % Emul  Generic drug:  Difluprednate  Place 1 drop into the left eye every 3 (three) hours. Takes 8  times daily     Fish Oil 500 MG Caps  Take 500 mg by mouth daily.     folic acid 1 MG tablet  Commonly known as:  FOLVITE  Take 2 mg by mouth daily.     furosemide 40 MG tablet  Commonly known as:  LASIX  Take 1 tablet (40 mg total) by mouth daily.     Garlic [id number] MG Caps  Take 1,000 mg by mouth daily.     hydrALAZINE 50 MG tablet  Commonly known as:  APRESOLINE  Take 50 mg by mouth 2 (two) times daily.     insulin glargine 100 UNIT/ML injection  Commonly known as:  LANTUS  Inject 16 Units into the skin at bedtime.     isosorbide mononitrate 30 MG 24 hr tablet  Commonly known as:  IMDUR  Take 1 tablet (30 mg total)  by mouth daily.     linagliptin 5 MG Tabs tablet  Commonly known as:  TRADJENTA  Take 5 mg by mouth daily.     nebivolol 5 MG tablet  Commonly known as:  BYSTOLIC  Take 5 mg by mouth daily.     polyethylene glycol packet  Commonly known as:  MIRALAX / GLYCOLAX  Take 17 g by mouth daily.     SIMBRINZA 1-0.2 % Susp  Generic drug:  Brinzolamide-Brimonidine  Place 1 drop into the left eye 3 (three) times daily.     timolol 0.5 % ophthalmic solution  Commonly known as:  BETIMOL  Place 1 drop into the left eye 2 (two) times daily.       Allergies  Allergen Reactions  . Ace Inhibitors Cough       Follow-up Information   Follow up with Jenkins Rouge, MD. (Office will call you for your followup appointment. Call office if you have not heard back in 4 days.)    Specialty:  Cardiology   Contact information:   [id number] [address] [phone]       Follow up with Jilda Panda, MD In 2 weeks.   Specialty:  Internal Medicine   Contact information:   [address] [place] [address] [phone]        The results of significant diagnostics from this hospitalization (including imaging, microbiology, ancillary and laboratory) are listed below for reference.    Significant Diagnostic Studies: Dg Chest 2  View  02/17/2014   CLINICAL DATA:  Near syncope.  Dizziness.  EXAM: CHEST  2 VIEW  COMPARISON:  None.  FINDINGS: Enlarged cardiac silhouette.  Clear lungs.  Mild scoliosis.  IMPRESSION: Cardiomegaly.  No acute abnormality.   Electronically Signed   By: Enrique Sack M.D.   On: 02/17/2014 17:51   Ct Head Wo Contrast  02/22/2014   CLINICAL DATA:  Fatigue, weakness, elevated blood pressure, syncope  EXAM: CT HEAD WITHOUT CONTRAST  TECHNIQUE: Contiguous axial images were obtained from the base of the skull through the vertex without intravenous contrast.  COMPARISON:  03/08/2010  FINDINGS: Severe diffuse cortical atrophy. Severe low attenuation in the deep white matter. The severity of deep white matter attenuation change has increased when compared to prior study, as has the degree of atrophy. There is no evidence of acute vascular territory infarct. There is no hemorrhage or extra-axial fluid. No hydrocephalus.  The left maxillary sinus is completely opacified, and is also mildly expanded. Mild mucosal thickening right maxillary sinus. Sphenoid, ethmoid, and frontal sinuses are clear. Calvarium is intact.  IMPRESSION: Complete opacification and expansion of left maxillary sinus suggesting severe chronic sinusitis.  Severe chronic ischemic change, progressive when compared to 2011.   Electronically Signed   By: Skipper Cliche M.D.   On: 02/22/2014 12:32   US Renal  01/29/2014   CLINICAL DATA:  Stage 4 chronic kidney disease.  EXAM: RENAL/URINARY TRACT ULTRASOUND COMPLETE  COMPARISON:  None.  FINDINGS: Right Kidney:  Length: 10.2 cm. Echogenicity is normal. Benign appearing 2.4 cm cyst in the lateral aspect of the mid kidney. No hydronephrosis.  Left Kidney:  Length: 11.2 cm. Echogenicity within normal limits. No mass or hydronephrosis visualized.  Bladder:  The bladder is normal.  Prostate gland is prominent.  IMPRESSION: Prominent prostate gland. Benign appearing cyst in the right kidney. Otherwise normal.    Electronically Signed   By: Rozetta Nunnery M.D.   On: 01/29/2014 16:48  Dg Chest Portable 1 View  02/22/2014   CLINICAL DATA:  Fatigue and hypotension  EXAM: PORTABLE CHEST - 1 VIEW  COMPARISON:  PA and lateral chest of February 17, 2014  FINDINGS: The lungs are reasonably well inflated and clear. The cardiopericardial silhouette is enlarged. The pulmonary vascularity is prominent centrally but there is no cephalization. There is no pleural effusion. The bony thorax is unremarkable. There is curvature of the thoracic spine with the convexity towards the right. There is mild degenerative change of the right AC joint.  IMPRESSION: There is chronic enlargement of the cardiac silhouette which may reflect low-grade compensated CHF. There is no pulmonary edema or pneumonia.   Electronically Signed   By: David  Martinique   On: 02/22/2014 13:33    Microbiology: No results found for this or any previous visit (from the past 240 hour(s)).   Labs: Basic Metabolic Panel:  Recent Labs Lab 02/17/14 1706 02/18/14 0014 02/22/14 1140 02/23/14 0503  NA 136* 139 139 144  K 4.4 4.3 4.5 4.4  CL 100 102 103 108  CO2 23 25 24 26   GLUCOSE 103* 226* 147* 106*  BUN 40* 41* 39* 37*  CREATININE 3.23* 3.31* 3.15* 3.05*  CALCIUM 9.1 8.8 9.0 8.9  MG  --  2.5  --   --   PHOS  --  3.7  --   --    Liver Function Tests:  Recent Labs Lab 02/17/14 1706 02/18/14 0014  AST 32 29  ALT 24 22  ALKPHOS 73 72  BILITOT 0.3 0.4  PROT 7.1 6.8  ALBUMIN 3.4* 3.3*   No results found for this basename: LIPASE, AMYLASE,  in the last 168 hours No results found for this basename: AMMONIA,  in the last 168 hours CBC:  Recent Labs Lab 02/17/14 1706 02/18/14 0014 02/22/14 1140  WBC 8.9 8.6 8.3  NEUTROABS 7.1  --  6.5  HGB 12.7* 12.4* 12.8*  HCT 39.1 37.8* 38.1*  MCV 82.8 83.4 82.5  PLT 163 184 180   Cardiac Enzymes:  Recent Labs Lab 02/18/14 0718 02/18/14 1220 02/22/14 0002 02/22/14 1108 02/22/14 1827  TROPONINI  <0.30 <0.30 <0.30 0.30* <0.30   BNP: BNP (last 3 results)  Recent Labs  02/17/14 2150 02/22/14 1108  PROBNP 3134.0* 4126.0*   CBG:  Recent Labs Lab 02/17/14 2125 02/18/14 0733 02/18/14 1202 02/22/14 2025  GLUCAP 94 78 119* 138*       Signed:  Shaelee Forni  [facility] 02/23/2014, 2:48 PM

## 2014-02-23 NOTE — Progress Notes (Addendum)
Patient Name: Gregory Hood Date of Encounter: 02/23/2014  Principal Problem:   Orthostatic hypotension Active Problems:   Essential hypertension, benign   DM (diabetes mellitus), type 2 with renal complications   CKD (chronic kidney disease), stage IV   Abnormal ECG   Elevated troponin   Chronic diastolic heart failure   Other and unspecified hyperlipidemia   Length of Stay: 1  SUBJECTIVE  Denies chest pain or shortness of breath, dizziness has resolved  CURRENT MEDS . aspirin  162 mg Oral Daily  . atorvastatin  40 mg Oral QHS  . brimonidine  1 drop Left Eye TID  . brinzolamide  1 drop Left Eye TID  . calcitRIOL  0.25 mcg Oral Daily  . Difluprednate  1 drop Left Eye [id number]  . folic acid  1 mg Oral Daily  . heparin  5,000 Units Subcutaneous 3 times per day  . hydrALAZINE  25 mg Oral BID  . insulin glargine  16 Units Subcutaneous QHS  . linagliptin  5 mg Oral Daily  . nebivolol  5 mg Oral Daily  . polyethylene glycol  17 g Oral Daily  . timolol  1 drop Left Eye BID   OBJECTIVE  Filed Vitals:   02/23/14 0500 02/23/14 0503 02/23/14 0506 02/23/14 0928  BP: 170/88 157/81 141/72 158/78  Pulse: 62 60 61 77  Temp: 98.8 F (37.1 C)   98.3 F (36.8 C)  TempSrc:    Oral  Resp: 18   18  SpO2: 100%   100%   PHYSICAL EXAM  General: Pleasant, NAD. Neuro: Alert and oriented X 3. Moves all extremities spontaneously. Psych: Normal affect. HEENT:  Normal  Neck: Supple without bruits or JVD. Lungs:  Resp regular and unlabored, CTA. Heart: RRR no s3, s4, or murmurs. Abdomen: Soft, non-tender, non-distended, BS + x 4.  Extremities: No clubbing, cyanosis,mild LE edema L>R. DP/PT/Radials 2+ and equal bilaterally.  Accessory Clinical Findings  CBC  Recent Labs  02/22/14 1140  WBC 8.3  NEUTROABS 6.5  HGB 12.8*  HCT 38.1*  MCV 82.5  PLT 99991111   Basic Metabolic Panel  Recent Labs  02/22/14 1140 02/23/14 0503  NA 139 144  K 4.5 4.4  CL 103 108  CO2 24 26    GLUCOSE 147* 106*  BUN 39* 37*  CREATININE 3.15* 3.05*  CALCIUM 9.0 8.9   Cardiac Enzymes  Recent Labs  02/22/14 0002 02/22/14 1108 02/22/14 1827  TROPONINI <0.30 0.30* <0.30   Ct Head Wo Contrast  02/22/2014   CLINICAL DATA:  Fatigue, weakness, elevated blood pressure, syncope  IMPRESSION: Complete opacification and expansion of left maxillary sinus suggesting severe chronic sinusitis.  Severe chronic ischemic change, progressive when compared to 2011.        Dg Chest Portable 1 View  02/22/2014   CLINICAL DATA:  Fatigue and hypotension  EXAM: PORTABLE CHEST -  IMPRESSION: There is chronic enlargement of the cardiac silhouette which may reflect low-grade compensated CHF. There is no pulmonary edema or pneumonia.     TELE: SR  ECG: SR, negative T waves in the inferolateral leads, LVH with repolarization abnormalities.   ECHO: Study Conclusions  - Left ventricle: Wall thickness was increased in a pattern of moderate to severe LVH. The estimated ejection fraction was 60%. Wall motion was normal; there were no regional wall motion abnormalities. Doppler parameters are consistent with high ventricular filling pressure. - Left atrium: The atrium was mildly dilated. - Right atrium: The atrium was  mildly dilated. Impressions:  - No obvious shunt at the atrial septal level. No obvious cardiac source of embolus.    ASSESSMENT AND PLAN  Active Problems:  Lightheadedness  Hypertension  Diabetes mellitus  CKD (chronic kidney disease), stage IV  Dehydration  Abnormal ECG   Gregory Hood is a 63 y.o. male with a history of diabetes, CKD, CVA, hypertension, severe LVH, recent admission for pre-syncope and AKI and left AMA who presented to [facility] today for near syncope and hypotension. His troponin was noted to be elevated ( at 0.30) and cardiology was consulted.   1. Presyncope- likely due to orthostatic hypotension from dysautonomia due to long standing DM - hold diuretics for  now, we will restart once  - Consider compression stockings, isometrics prior to standing.  - event monitor the last year at [facility]  2. Acute diastolic CHF. LE edema (L>R), dopplers today neg for DVT , possible mild CHF on CXR and BNP elevated >4K. Recently discharged 4 days ago for IVF for worsening renal function and pre-syncope.  -- He denies SOB, orthopnea, PND.  -- Bedside ECHO with LVH and normal systolic function. EF 60%. Normal RV.  -- Was on Lasix 80mg  qd as an outpatient. Will likely need to scale back with orthostatic hypotension   3. Elevated troponin- Very mild (0.3) and non-specific in the setting of CKD stage IV.   4. CKD- stage IV. Creat stable from last admission.   5. HTN- not controlled. Severe LVH on echo - add imdur 30 m po daily  The patient can be discharged home with follow up with Dr Johnsie Cancel  Signed, Dorothy Spark MD, [facility] 02/23/2014

## 2014-02-23 NOTE — Discharge Instructions (Signed)

## 2014-02-25 NOTE — Care Management (Signed)
1020 02-25-14 CM did touch base with pt and wife for [facility] services. CM set pt up with [facility] for [facility], PT services. Referral placed with [facility] and SOC ot begin within 24-48 hours post d/c. Adelfa Koh Ocie Cornfield, RN,BSN [phone]

## 2014-02-26 ENCOUNTER — Telehealth: Payer: Self-pay | Admitting: Cardiovascular Disease

## 2014-02-28 NOTE — Telephone Encounter (Signed)
Closed encounter °

## 2014-03-04 ENCOUNTER — Emergency Department (HOSPITAL_COMMUNITY)
Admission: EM | Admit: 2014-03-04 | Discharge: 2014-03-05 | Disposition: A | Discharge status: Home / Self Care | Payer: 59 | Attending: Emergency Medicine | Admitting: Emergency Medicine

## 2014-03-04 ENCOUNTER — Encounter (HOSPITAL_COMMUNITY): Payer: Self-pay | Admitting: Emergency Medicine

## 2014-03-04 DIAGNOSIS — Z7982 Long term (current) use of aspirin: Secondary | ICD-10-CM | POA: Diagnosis not present

## 2014-03-04 DIAGNOSIS — Z79899 Other long term (current) drug therapy: Secondary | ICD-10-CM | POA: Insufficient documentation

## 2014-03-04 DIAGNOSIS — Z8673 Personal history of transient ischemic attack (TIA), and cerebral infarction without residual deficits: Secondary | ICD-10-CM | POA: Diagnosis not present

## 2014-03-04 DIAGNOSIS — N289 Disorder of kidney and ureter, unspecified: Secondary | ICD-10-CM | POA: Insufficient documentation

## 2014-03-04 DIAGNOSIS — R609 Edema, unspecified: Secondary | ICD-10-CM | POA: Diagnosis not present

## 2014-03-04 DIAGNOSIS — I158 Other secondary hypertension: Secondary | ICD-10-CM | POA: Insufficient documentation

## 2014-03-04 DIAGNOSIS — E119 Type 2 diabetes mellitus without complications: Secondary | ICD-10-CM | POA: Insufficient documentation

## 2014-03-04 DIAGNOSIS — Z87891 Personal history of nicotine dependence: Secondary | ICD-10-CM | POA: Diagnosis not present

## 2014-03-04 DIAGNOSIS — I159 Secondary hypertension, unspecified: Secondary | ICD-10-CM

## 2014-03-04 LAB — BASIC METABOLIC PANEL
ANION GAP: 14 (ref 5–15)
BUN: 39 mg/dL — AB (ref 6–23)
CALCIUM: 8.9 mg/dL (ref 8.4–10.5)
CO2: 25 mEq/L (ref 19–32)
Chloride: 100 mEq/L (ref 96–112)
Creatinine, Ser: 3.01 mg/dL — ABNORMAL HIGH (ref 0.50–1.35)
GFR calc non Af Amer: 21 mL/min — ABNORMAL LOW (ref >90)
GFR, EST AFRICAN AMERICAN: 24 mL/min — AB (ref >90)
Glucose, Bld: 127 mg/dL — ABNORMAL HIGH (ref 70–99)
Potassium: 4.3 mEq/L (ref 3.7–5.3)
Sodium: 139 mEq/L (ref 137–147)

## 2014-03-04 LAB — CBC WITH DIFFERENTIAL/PLATELET
BASOS ABS: 0 10*3/uL (ref 0.0–0.1)
BASOS PCT: 0 % (ref 0–1)
Eosinophils Absolute: 0.2 10*3/uL (ref 0.0–0.7)
Eosinophils Relative: 2 % (ref 0–5)
HEMATOCRIT: 34.8 % — AB (ref 39.0–52.0)
HEMOGLOBIN: 11.6 g/dL — AB (ref 13.0–17.0)
Lymphocytes Relative: 22 % (ref 12–46)
Lymphs Abs: 1.8 10*3/uL (ref 0.7–4.0)
MCH: 27.9 pg (ref 26.0–34.0)
MCHC: 33.3 g/dL (ref 30.0–36.0)
MCV: 83.7 fL (ref 78.0–100.0)
Monocytes Absolute: 0.6 10*3/uL (ref 0.1–1.0)
Monocytes Relative: 8 % (ref 3–12)
NEUTROS ABS: 5.3 10*3/uL (ref 1.7–7.7)
Neutrophils Relative %: 68 % (ref 43–77)
Platelets: 184 10*3/uL (ref 150–400)
RBC: 4.16 MIL/uL — ABNORMAL LOW (ref 4.22–5.81)
RDW: 13.5 % (ref 11.5–15.5)
WBC: 7.9 10*3/uL (ref 4.0–10.5)

## 2014-03-04 NOTE — ED Notes (Signed)
Discharge instructions reviewed with pt. Pt verbalized understanding.   

## 2014-03-04 NOTE — Discharge Instructions (Signed)
Continue all regular medications. Return if any problems. Otherwise follow up with your doctor or call tomorrow for further instructions.    Hypertension Hypertension, commonly called high blood pressure, is when the force of blood pumping through your arteries is too strong. Your arteries are the blood vessels that carry blood from your heart throughout your body. A blood pressure reading consists of a higher number over a lower number, such as 110/72. The higher number (systolic) is the pressure inside your arteries when your heart pumps. The lower number (diastolic) is the pressure inside your arteries when your heart relaxes. Ideally you want your blood pressure below 120/80. Hypertension forces your heart to work harder to pump blood. Your arteries may become narrow or stiff. Having hypertension puts you at risk for heart disease, stroke, and other problems.  RISK FACTORS Some risk factors for high blood pressure are controllable. Others are not.  Risk factors you cannot control include:   Race. You may be at higher risk if you are African American.  Age. Risk increases with age.  Gender. Men are at higher risk than women before age 92 years. After age 57, women are at higher risk than men. Risk factors you can control include:  Not getting enough exercise or physical activity.  Being overweight.  Getting too much fat, sugar, calories, or salt in your diet.  Drinking too much alcohol. SIGNS AND SYMPTOMS Hypertension does not usually cause signs or symptoms. Extremely high blood pressure (hypertensive crisis) may cause headache, anxiety, shortness of breath, and nosebleed. DIAGNOSIS  To check if you have hypertension, your health care provider will measure your blood pressure while you are seated, with your arm held at the level of your heart. It should be measured at least twice using the same arm. Certain conditions can cause a difference in blood pressure between your right and left  arms. A blood pressure reading that is higher than normal on one occasion does not mean that you need treatment. If one blood pressure reading is high, ask your health care provider about having it checked again. TREATMENT  Treating high blood pressure includes making lifestyle changes and possibly taking medicine. Living a healthy lifestyle can help lower high blood pressure. You may need to change some of your habits. Lifestyle changes may include:  Following the DASH diet. This diet is high in fruits, vegetables, and whole grains. It is low in salt, red meat, and added sugars.  Getting at least 2 hours of brisk physical activity every week.  Losing weight if necessary.  Not smoking.  Limiting alcoholic beverages.  Learning ways to reduce stress. If lifestyle changes are not enough to get your blood pressure under control, your health care provider may prescribe medicine. You may need to take more than one. Work closely with your health care provider to understand the risks and benefits. HOME CARE INSTRUCTIONS  Have your blood pressure rechecked as directed by your health care provider.   Take medicines only as directed by your health care provider. Follow the directions carefully. Blood pressure medicines must be taken as prescribed. The medicine does not work as well when you skip doses. Skipping doses also puts you at risk for problems.   Do not smoke.   Monitor your blood pressure at home as directed by your health care provider. SEEK MEDICAL CARE IF:   You think you are having a reaction to medicines taken.  You have recurrent headaches or feel dizzy.  You have swelling  in your ankles.  You have trouble with your vision. SEEK IMMEDIATE MEDICAL CARE IF:  You develop a severe headache or confusion.  You have unusual weakness, numbness, or feel faint.  You have severe chest or abdominal pain.  You vomit repeatedly.  You have trouble breathing. MAKE SURE YOU:     Understand these instructions.  Will watch your condition.  Will get help right away if you are not doing well or get worse. Document Released: 06/14/2005 Document Revised: 10/29/2013 Document Reviewed: 04/06/2013 [facility] Patient Information 2015 [place], [place]. This information is not intended to replace advice given to you by your health care provider. Make sure you discuss any questions you have with your health care provider.

## 2014-03-04 NOTE — ED Notes (Signed)
Pt reports that his BP was yesterday morning and today. States that his nurse told him to come in for evaluation. Pt denies any dizziness or light headedness. No complaints at this time

## 2014-03-04 NOTE — ED Notes (Signed)
Lahoma Rocker, PA-C at bedside.

## 2014-03-04 NOTE — ED Provider Notes (Signed)
CSN: [id number]     Arrival date & time 03/04/14  1645 History   First MD Initiated Contact with Patient 03/04/14 2141     Chief Complaint  Patient presents with  . Hypertension     (Consider location/radiation/quality/duration/timing/severity/associated sxs/prior Treatment) HPI Gregory Hood is a 63 y.o. male who presents to ED with complaint of elevated BP at home. Pt states that he was recently admitted and treated for orthostatic hypotension and uncontrolled hypertension. Discharged one week ago. During that admission, according to records, patient was started on a new medication, Imdur, and his Lasix dose was reduced. Patient states he has been compliant with his medication since discharge. He now has a home health nurse that comes out and check his vital signs daily. Yesterday and today his blood pressure has been elevated to 99991111 systolic. Patient denies any associated symptoms. He specifically denies headache, chest pain, shortness of breath, increased swelling in his extremities. He is here because of recommendation of his home health nurse.  Past Medical History  Diagnosis Date  . Hypertension   . Diabetes mellitus without complication   . TIA (transient ischemic attack)    History reviewed. No pertinent past surgical history. Family History  Problem Relation Age of Onset  . Hypertension Mother   . Diabetes type II Mother   . Transient ischemic attack Mother   . Hypertension Father   . Diabetes type II Father   . Diabetes type II Sister   . Hypertension Sister    History  Substance Use Topics  . Smoking status: Former Research scientist (life sciences)  . Smokeless tobacco: Not on file  . Alcohol Use: No    Review of Systems  Constitutional: Negative for fever and chills.  Respiratory: Negative for cough, chest tightness and shortness of breath.   Cardiovascular: Positive for leg swelling. Negative for chest pain and palpitations.  Gastrointestinal: Negative for nausea, vomiting, abdominal pain,  diarrhea and abdominal distention.  Genitourinary: Negative for dysuria, urgency, frequency and hematuria.  Musculoskeletal: Negative for arthralgias, myalgias, neck pain and neck stiffness.  Skin: Negative for rash.  Allergic/Immunologic: Negative for immunocompromised state.  Neurological: Negative for dizziness, weakness, light-headedness, numbness and headaches.  All other systems reviewed and are negative.     Allergies  Ace inhibitors  Home Medications   Prior to Admission medications   Medication Sig Start Date End Date Taking? Authorizing Provider  aspirin 81 MG tablet Take 162 mg by mouth daily.    Historical Provider, MD  atorvastatin (LIPITOR) 40 MG tablet Take 40 mg by mouth daily.    Historical Provider, MD  Brinzolamide-Brimonidine [facility]) 1-0.2 % SUSP Place 1 drop into the left eye 3 (three) times daily.    Historical Provider, MD  calcitRIOL (ROCALTROL) 0.25 MCG capsule Take 0.25 mcg by mouth daily.    Historical Provider, MD  Difluprednate (DUREZOL) 0.05 % EMUL Place 1 drop into the left eye every 3 (three) hours. Takes 8 times daily    Historical Provider, MD  folic acid (FOLVITE) 1 MG tablet Take 2 mg by mouth daily.    Historical Provider, MD  furosemide (LASIX) 40 MG tablet Take 1 tablet (40 mg total) by mouth daily. 02/23/14   Kelvin Cellar, MD  Garlic [id number] MG CAPS Take 1,000 mg by mouth daily.    Historical Provider, MD  hydrALAZINE (APRESOLINE) 50 MG tablet Take 50 mg by mouth 2 (two) times daily.    Historical Provider, MD  insulin glargine (LANTUS) 100 UNIT/ML injection Inject 16  Units into the skin at bedtime.    Historical Provider, MD  isosorbide mononitrate (IMDUR) 30 MG 24 hr tablet Take 1 tablet (30 mg total) by mouth daily. 02/23/14   Kelvin Cellar, MD  linagliptin (TRADJENTA) 5 MG TABS tablet Take 5 mg by mouth daily.    Historical Provider, MD  nebivolol (BYSTOLIC) 5 MG tablet Take 5 mg by mouth daily.     Historical Provider, MD  Omega-3 Fatty  Acids (FISH OIL) 500 MG CAPS Take 500 mg by mouth daily.    Historical Provider, MD  polyethylene glycol (MIRALAX / GLYCOLAX) packet Take 17 g by mouth daily.     Historical Provider, MD  timolol (BETIMOL) 0.5 % ophthalmic solution Place 1 drop into the left eye 2 (two) times daily.    Historical Provider, MD   BP 154/82  Pulse 73  Temp(Src) 98.4 F (36.9 C) (Oral)  Resp 15  Ht 5\' 9"  (1.753 m)  Wt 237 lb (107.502 kg)  BMI 34.98 kg/m2  SpO2 100% Physical Exam  Nursing note and vitals reviewed. Constitutional: He appears well-developed and well-nourished. No distress.  HENT:  Head: Normocephalic and atraumatic.  Eyes: Conjunctivae are normal.  Neck: Neck supple.  Cardiovascular: Normal rate, regular rhythm and normal heart sounds.   Pulmonary/Chest: Effort normal. No respiratory distress. He has no wheezes. He has no rales.  Abdominal: Soft. Bowel sounds are normal. He exhibits no distension. There is no tenderness. There is no rebound.  Musculoskeletal:  Bilateral LE pitting edema left greater than right  Neurological: He is alert.  Skin: Skin is warm and dry.    ED Course  Procedures (including critical care time) Labs Review Labs Reviewed  CBC WITH DIFFERENTIAL - Abnormal; Notable for the following:    RBC 4.16 (*)    Hemoglobin 11.6 (*)    HCT 34.8 (*)    All other components within normal limits  BASIC METABOLIC PANEL - Abnormal; Notable for the following:    Glucose, Bld 127 (*)    BUN 39 (*)    Creatinine, Ser 3.01 (*)    GFR calc non Af Amer 21 (*)    GFR calc Af Amer 24 (*)    All other components within normal limits    Imaging Review No results found.   EKG Interpretation None      MDM   Final diagnoses:  Secondary hypertension, unspecified  Renal insufficiency    Pt with recent admission for orthostatic hypotension and uncontrolled hypertension. BP has been in 0000000 systolic up until yesterday when it was 99991111 systolic and again today. Pt states  he has been eating well. No changes in medications. He is asymptomatic. Here "just to make sure everything is ok." will repeat metabolic panic.    Creatinine  3.01 and is at baseline. Pt continues to be symptom free. BP down to [id number] systolic. No evidence of new end organ damage. Home with PCP follow up.   Filed Vitals:   03/04/14 2140 03/04/14 2145 03/04/14 2215 03/04/14 2330  BP: 183/101 174/96 154/82 167/94  Pulse: 68 65 73 40  Temp:      TempSrc:      Resp: 12 13 15 18   Height:      Weight:      SpO2: 100% 100% 100% 100%          Renold Genta, PA-C 03/05/14 [place], PA-C 03/05/14 0044

## 2014-03-05 ENCOUNTER — Encounter: Payer: Self-pay | Admitting: Vascular Surgery

## 2014-03-05 ENCOUNTER — Other Ambulatory Visit: Payer: Self-pay

## 2014-03-05 DIAGNOSIS — N184 Chronic kidney disease, stage 4 (severe): Secondary | ICD-10-CM

## 2014-03-05 DIAGNOSIS — Z01810 Encounter for preprocedural cardiovascular examination: Secondary | ICD-10-CM

## 2014-03-05 NOTE — ED Provider Notes (Signed)
Medical screening examination/treatment/procedure(s) were conducted as a shared visit with non-physician practitioner(s) and myself.  I personally evaluated the patient during the encounter.   EKG Interpretation None      Pt comes to the ER with cc of elevated BP. Pt has no chest pain, dib, vision complains, new headaches, fait instability. Pt is aox 3. No clinical evidence of end organ damage.    Varney Biles, MD 03/05/14 1539

## 2014-03-06 ENCOUNTER — Encounter (HOSPITAL_COMMUNITY): Payer: Self-pay | Admitting: Emergency Medicine

## 2014-03-06 ENCOUNTER — Emergency Department (HOSPITAL_COMMUNITY)
Admission: EM | Admit: 2014-03-06 | Discharge: 2014-03-06 | Disposition: A | Discharge status: Home / Self Care | Payer: 59 | Attending: Emergency Medicine | Admitting: Emergency Medicine

## 2014-03-06 DIAGNOSIS — Z8719 Personal history of other diseases of the digestive system: Secondary | ICD-10-CM | POA: Insufficient documentation

## 2014-03-06 DIAGNOSIS — Z794 Long term (current) use of insulin: Secondary | ICD-10-CM | POA: Insufficient documentation

## 2014-03-06 DIAGNOSIS — I129 Hypertensive chronic kidney disease with stage 1 through stage 4 chronic kidney disease, or unspecified chronic kidney disease: Secondary | ICD-10-CM | POA: Insufficient documentation

## 2014-03-06 DIAGNOSIS — N189 Chronic kidney disease, unspecified: Secondary | ICD-10-CM | POA: Insufficient documentation

## 2014-03-06 DIAGNOSIS — E1139 Type 2 diabetes mellitus with other diabetic ophthalmic complication: Secondary | ICD-10-CM | POA: Diagnosis not present

## 2014-03-06 DIAGNOSIS — G4733 Obstructive sleep apnea (adult) (pediatric): Secondary | ICD-10-CM | POA: Insufficient documentation

## 2014-03-06 DIAGNOSIS — E785 Hyperlipidemia, unspecified: Secondary | ICD-10-CM | POA: Insufficient documentation

## 2014-03-06 DIAGNOSIS — E11319 Type 2 diabetes mellitus with unspecified diabetic retinopathy without macular edema: Secondary | ICD-10-CM | POA: Diagnosis not present

## 2014-03-06 DIAGNOSIS — Z79899 Other long term (current) drug therapy: Secondary | ICD-10-CM | POA: Diagnosis not present

## 2014-03-06 DIAGNOSIS — Z7982 Long term (current) use of aspirin: Secondary | ICD-10-CM | POA: Insufficient documentation

## 2014-03-06 DIAGNOSIS — Z87891 Personal history of nicotine dependence: Secondary | ICD-10-CM | POA: Insufficient documentation

## 2014-03-06 DIAGNOSIS — Z8673 Personal history of transient ischemic attack (TIA), and cerebral infarction without residual deficits: Secondary | ICD-10-CM | POA: Diagnosis not present

## 2014-03-06 DIAGNOSIS — E669 Obesity, unspecified: Secondary | ICD-10-CM | POA: Insufficient documentation

## 2014-03-06 DIAGNOSIS — I10 Essential (primary) hypertension: Secondary | ICD-10-CM

## 2014-03-06 NOTE — ED Notes (Signed)
Dr. Claudine Mouton in to see pt/family at time of d/c.

## 2014-03-06 NOTE — ED Provider Notes (Signed)
Patient presents out of concern for high blood pressure after PCP recently adjusted medication.  Per wife, SBP was in the 180s at home.  On my assessment SBP was 160.  Patient has been asymptomatic the entire night including headache, blurry vision, CP, SOB, or any changes in his urination.  HTN education was provided to family and they were told to follow up with PCP within 3 days to continue to have meds readjusted.  No emergent need to lower BP at this time.  Patient is safe for discharge.  Medical screening examination/treatment/procedure(s) were conducted as a shared visit with non-physician practitioner(s) and myself.  I personally evaluated the patient during the encounter.   EKG Interpretation   Date/Time:  Wednesday March 06 2014 02:02:57 EDT Ventricular Rate:  69 PR Interval:  186 QRS Duration: 88 QT Interval:  446 QTC Calculation: 478 R Axis:   68 Text Interpretation:  Sinus rhythm RSR' in V1 or V2, probably normal  variant Nonspecific T abnormalities, lateral leads Borderline prolonged QT  interval No significant change since last tracing Confirmed by Glynn Octave [phone]) on 03/06/2014 2:18:45 AM        Everlene Balls, MD 03/06/14 1750

## 2014-03-06 NOTE — ED Provider Notes (Signed)
CSN: [id number]     Arrival date & time 03/06/14  0021 History   First MD Initiated Contact with Patient 03/06/14 0115     Chief Complaint  Patient presents with  . Hypertension     (Consider location/radiation/quality/duration/timing/severity/associated sxs/prior Treatment) HPI Comments: Is a patient with poorly controlled hypertension, who saw his cardiologist on Friday.  Medications were changed slightly.  Lasix was decreased and Apresoline was added 50 mg twice a day, which he, states he has been taking her regular basis. When the home health nurse came and checked his pressure.  This afternoon.  He noticed it was elevated.  He was instructed the wife to check his blood pressure.  Approximately one hour after his PE and medicines.  She states, that it was elevated, and they decided to come to the emergency room again for further evaluation.  He's been asymptomatic denying any nausea, vomiting, headache, chest pain, shortness of breath, swelling of his extremities, visual changes. He was seen and evaluated last night for the same with negative results and discharged home.  Patient is a 63 y.o. male presenting with hypertension. The history is provided by the patient and the spouse.  Hypertension This is a recurrent problem. The current episode started today. The problem occurs constantly. The problem has been unchanged. Pertinent negatives include no abdominal pain, chest pain, congestion, coughing, headaches, nausea, neck pain, rash, visual change or vomiting. Nothing aggravates the symptoms. He has tried nothing for the symptoms. The treatment provided no relief.    Past Medical History  Diagnosis Date  . Hypertension   . Diabetes mellitus without complication   . TIA (transient ischemic attack)   . Stroke   . Chronic kidney disease   . Hyperlipidemia   . Obesity   . OSA on CPAP   . Retinal detachment   . Diabetic retinopathy   . Umbilical hernia    History reviewed. No pertinent  past surgical history. Family History  Problem Relation Age of Onset  . Hypertension Mother   . Diabetes type II Mother   . Transient ischemic attack Mother   . Kidney disease Mother   . Hypertension Father   . Diabetes type II Father   . Diabetes type II Sister   . Hypertension Sister   . Learning disabilities Maternal Uncle    History  Substance Use Topics  . Smoking status: Former Smoker    Types: Cigarettes    Quit date: 03/05/1974  . Smokeless tobacco: Not on file  . Alcohol Use: No    Review of Systems  Constitutional: Negative for activity change and appetite change.  HENT: Negative for congestion.   Eyes: Negative for photophobia and visual disturbance.  Respiratory: Negative for cough.   Cardiovascular: Negative for chest pain.  Gastrointestinal: Negative for nausea, vomiting and abdominal pain.  Genitourinary: Negative for dysuria.  Musculoskeletal: Negative for neck pain.  Skin: Negative for rash.  Neurological: Negative for dizziness and headaches.      Allergies  Ace inhibitors  Home Medications   Prior to Admission medications   Medication Sig Start Date End Date Taking? Authorizing Provider  aspirin 81 MG tablet Take 162 mg by mouth daily.   Yes Historical Provider, MD  atorvastatin (LIPITOR) 40 MG tablet Take 40 mg by mouth daily.   Yes Historical Provider, MD  Brinzolamide-Brimonidine [facility]) 1-0.2 % SUSP Place 1 drop into the left eye 3 (three) times daily.   Yes Historical Provider, MD  calcitRIOL (ROCALTROL) 0.25 MCG  capsule Take 0.25 mcg by mouth daily.   Yes Historical Provider, MD  Difluprednate (DUREZOL) 0.05 % EMUL Place 1 drop into the left eye every 3 (three) hours. Takes 8 times daily   Yes Historical Provider, MD  folic acid (FOLVITE) 1 MG tablet Take 2 mg by mouth daily.   Yes Historical Provider, MD  furosemide (LASIX) 40 MG tablet Take 1 tablet (40 mg total) by mouth daily. 02/23/14  Yes Kelvin Cellar, MD  Garlic [id number] MG CAPS  Take 1,000 mg by mouth daily.   Yes Historical Provider, MD  hydrALAZINE (APRESOLINE) 50 MG tablet Take 50 mg by mouth every 8 (eight) hours.    Yes Historical Provider, MD  insulin glargine (LANTUS) 100 UNIT/ML injection Inject 14-16 Units into the skin at bedtime.    Yes Historical Provider, MD  isosorbide mononitrate (IMDUR) 30 MG 24 hr tablet Take 1 tablet (30 mg total) by mouth daily. 02/23/14  Yes Kelvin Cellar, MD  linagliptin (TRADJENTA) 5 MG TABS tablet Take 5 mg by mouth daily.   Yes Historical Provider, MD  nebivolol (BYSTOLIC) 5 MG tablet Take 5 mg by mouth daily at 6 PM.    Yes Historical Provider, MD  Omega-3 Fatty Acids (FISH OIL) 500 MG CAPS Take 500 mg by mouth daily.   Yes Historical Provider, MD  polyethylene glycol (MIRALAX / GLYCOLAX) packet Take 17 g by mouth daily.    Yes Historical Provider, MD  timolol (BETIMOL) 0.5 % ophthalmic solution Place 1 drop into the left eye 2 (two) times daily.   Yes Historical Provider, MD   BP 166/95  Pulse 66  Temp(Src) 98.8 F (37.1 C) (Oral)  Resp 12  SpO2 99% Physical Exam  Nursing note and vitals reviewed. Constitutional: He is oriented to person, place, and time. He appears well-developed and well-nourished.  HENT:  Head: Normocephalic.  Eyes: Pupils are equal, round, and reactive to light.  Cardiovascular: Normal rate and regular rhythm.   Pulmonary/Chest: Effort normal and breath sounds normal.  Musculoskeletal: Normal range of motion. He exhibits no edema.  Neurological: He is alert and oriented to person, place, and time.  Skin: Skin is warm and dry.  Psychiatric: He has a normal mood and affect.    ED Course  Procedures (including critical care time) Labs Review Labs Reviewed - No data to display  Imaging Review No results found.   EKG Interpretation None      Date: 03/06/2014  Rate:69  Rhythm: normal sinus rhythm  QRS Axis: normal  Intervals: normal  ST/T Wave abnormalities: nonspecific T wave changes   Conduction Disutrbances:none  Narrative Interpretation: LVH RSR'  Old EKG Reviewed: unchanged    MDM   Final diagnoses:  Essential hypertension         Garald Balding, NP 03/06/14 0205  Garald Balding, NP 03/06/14 [id number]

## 2014-03-06 NOTE — Discharge Instructions (Signed)
Hypertension °Hypertension, commonly called high blood pressure, is when the force of blood pumping through your arteries is too strong. Your arteries are the blood vessels that carry blood from your heart throughout your body. A blood pressure reading consists of a higher number over a lower number, such as 110/72. The higher number (systolic) is the pressure inside your arteries when your heart pumps. The lower number (diastolic) is the pressure inside your arteries when your heart relaxes. Ideally you want your blood pressure below 120/80. °Hypertension forces your heart to work harder to pump blood. Your arteries may become narrow or stiff. Having hypertension puts you at risk for heart disease, stroke, and other problems.  °RISK FACTORS °Some risk factors for high blood pressure are controllable. Others are not.  °Risk factors you cannot control include:  °· Race. You may be at higher risk if you are African American. °· Age. Risk increases with age. °· Gender. Men are at higher risk than women before age 45 years. After age 65, women are at higher risk than men. °Risk factors you can control include: °· Not getting enough exercise or physical activity. °· Being overweight. °· Getting too much fat, sugar, calories, or salt in your diet. °· Drinking too much alcohol. °SIGNS AND SYMPTOMS °Hypertension does not usually cause signs or symptoms. Extremely high blood pressure (hypertensive crisis) may cause headache, anxiety, shortness of breath, and nosebleed. °DIAGNOSIS  °To check if you have hypertension, your health care provider will measure your blood pressure while you are seated, with your arm held at the level of your heart. It should be measured at least twice using the same arm. Certain conditions can cause a difference in blood pressure between your right and left arms. A blood pressure reading that is higher than normal on one occasion does not mean that you need treatment. If one blood pressure reading  is high, ask your health care provider about having it checked again. °TREATMENT  °Treating high blood pressure includes making lifestyle changes and possibly taking medicine. Living a healthy lifestyle can help lower high blood pressure. You may need to change some of your habits. °Lifestyle changes may include: °· Following the DASH diet. This diet is high in fruits, vegetables, and whole grains. It is low in salt, red meat, and added sugars. °· Getting at least 2½ hours of brisk physical activity every week. °· Losing weight if necessary. °· Not smoking. °· Limiting alcoholic beverages. °· Learning ways to reduce stress. ° If lifestyle changes are not enough to get your blood pressure under control, your health care provider may prescribe medicine. You may need to take more than one. Work closely with your health care provider to understand the risks and benefits. °HOME CARE INSTRUCTIONS °· Have your blood pressure rechecked as directed by your health care provider.   °· Take medicines only as directed by your health care provider. Follow the directions carefully. Blood pressure medicines must be taken as prescribed. The medicine does not work as well when you skip doses. Skipping doses also puts you at risk for problems.   °· Do not smoke.   °· Monitor your blood pressure at home as directed by your health care provider.  °SEEK MEDICAL CARE IF:  °· You think you are having a reaction to medicines taken. °· You have recurrent headaches or feel dizzy. °· You have swelling in your ankles. °· You have trouble with your vision. °SEEK IMMEDIATE MEDICAL CARE IF: °· You develop a severe headache or confusion. °·   You have unusual weakness, numbness, or feel faint.  You have severe chest or abdominal pain.  You vomit repeatedly.  You have trouble breathing. MAKE SURE YOU:   Understand these instructions.  Will watch your condition.  Will get help right away if you are not doing well or get worse. Document  Released: 06/14/2005 Document Revised: 10/29/2013 Document Reviewed: 04/06/2013 [facility] Patient Information 2015 [place], [place]. This information is not intended to replace advice given to you by your health care provider. Make sure you discuss any questions you have with your health care provider. Continue taking your present medication as prescribed.  Please call Dr. Mellody Drown in the morning to see if you can be seen earlier I would only check your blood pressure 3 times a week if you develop symptoms such as headache, blurry vision, shortness of breath, chest pain, swelling.  In your lower extremities, please return to the emergency department for further evaluation

## 2014-03-06 NOTE — ED Notes (Addendum)
Pt. reports elevated blood pressure at home this evening 193/117 , seen here yesterday for the same complaint , blood tests done and was  discharged home . Denies chest pain or SOB . No nausea or headache . Compliant with his antihypertensive medications .

## 2014-03-06 NOTE — ED Notes (Addendum)
Pt alert, NAD, calm, interactive, resps e/u, speaking in clear complete sentences, no dyspnea, VSS, wife at [facility]. Pt (denies: pain, sob, HA, blurred vision, dizziness, HA, nvd, fever or other sx), VSS. BP improved from home & first BP on arrival. Last BP med taken PTA. Mentions increased urination, h/o DM.

## 2014-03-13 ENCOUNTER — Telehealth: Payer: Self-pay | Admitting: Cardiovascular Disease

## 2014-03-13 ENCOUNTER — Encounter: Payer: Self-pay | Admitting: Vascular Surgery

## 2014-03-13 NOTE — Telephone Encounter (Signed)
New message  Pt wife called states that the pt's BP is not normal. Pt has home care nurse and her last day was 03/13/2014 and the wife is requeting to have the services continued. Please call back to discuss.

## 2014-03-13 NOTE — Telephone Encounter (Signed)
Lmtcb.

## 2014-03-14 ENCOUNTER — Ambulatory Visit (HOSPITAL_COMMUNITY)
Admission: RE | Admit: 2014-03-14 | Discharge: 2014-03-14 | Disposition: A | Discharge status: Home / Self Care | Payer: 59 | Source: Ambulatory Visit | Attending: Vascular Surgery | Admitting: Vascular Surgery

## 2014-03-14 ENCOUNTER — Ambulatory Visit (INDEPENDENT_AMBULATORY_CARE_PROVIDER_SITE_OTHER)
Admission: RE | Admit: 2014-03-14 | Discharge: 2014-03-14 | Disposition: A | Discharge status: Home / Self Care | Payer: 59 | Source: Ambulatory Visit | Attending: Vascular Surgery | Admitting: Vascular Surgery

## 2014-03-14 ENCOUNTER — Encounter: Payer: Self-pay | Admitting: Vascular Surgery

## 2014-03-14 ENCOUNTER — Ambulatory Visit (INDEPENDENT_AMBULATORY_CARE_PROVIDER_SITE_OTHER): Payer: 59 | Admitting: Vascular Surgery

## 2014-03-14 VITALS — BP 155/85 | HR 59 | Ht 69.0 in | Wt 246.0 lb

## 2014-03-14 DIAGNOSIS — Z794 Long term (current) use of insulin: Secondary | ICD-10-CM | POA: Insufficient documentation

## 2014-03-14 DIAGNOSIS — Z87891 Personal history of nicotine dependence: Secondary | ICD-10-CM | POA: Insufficient documentation

## 2014-03-14 DIAGNOSIS — N184 Chronic kidney disease, stage 4 (severe): Secondary | ICD-10-CM

## 2014-03-14 DIAGNOSIS — N186 End stage renal disease: Secondary | ICD-10-CM | POA: Insufficient documentation

## 2014-03-14 DIAGNOSIS — Z01810 Encounter for preprocedural cardiovascular examination: Secondary | ICD-10-CM

## 2014-03-14 DIAGNOSIS — E119 Type 2 diabetes mellitus without complications: Secondary | ICD-10-CM | POA: Insufficient documentation

## 2014-03-14 DIAGNOSIS — I129 Hypertensive chronic kidney disease with stage 1 through stage 4 chronic kidney disease, or unspecified chronic kidney disease: Secondary | ICD-10-CM | POA: Diagnosis not present

## 2014-03-14 NOTE — Progress Notes (Signed)
Referred by:  Jilda Panda, MD [id number] [place],  [id number]  Reason for referral: New access  History of Present Illness  Gregory Hood is a 63 y.o. (11/13/1950) male who presents for evaluation for permanent access.  The patient is right hand dominant.  The patient has not had previous access procedures.  Previous central venous cannulation procedures include: none.  The patient has never had a PPM placed.   Past Medical History  Diagnosis Date  . Hypertension   . Diabetes mellitus without complication   . TIA (transient ischemic attack)   . Stroke   . Chronic kidney disease   . Hyperlipidemia   . Obesity   . OSA on CPAP   . Retinal detachment   . Diabetic retinopathy   . Umbilical hernia     History reviewed. No pertinent past surgical history.  History   Social History  . Marital Status: Married    Spouse Name: N/A    Number of Children: N/A  . Years of Education: N/A   Occupational History  . Not on file.   Social History Main Topics  . Smoking status: Former Smoker    Types: Cigarettes    Quit date: 03/05/1974  . Smokeless tobacco: Not on file  . Alcohol Use: No  . Drug Use: No  . Sexual Activity: Not on file   Other Topics Concern  . Not on file   Social History Narrative  . No narrative on file    Family History  Problem Relation Age of Onset  . Hypertension Mother   . Diabetes type II Mother   . Transient ischemic attack Mother   . Kidney disease Mother   . Hypertension Father   . Diabetes type II Father   . Diabetes type II Sister   . Hypertension Sister   . Learning disabilities Maternal Uncle     Current Outpatient Prescriptions on File Prior to Visit  Medication Sig Dispense Refill  . aspirin 81 MG tablet Take 162 mg by mouth daily.      Marland Kitchen atorvastatin (LIPITOR) 40 MG tablet Take 40 mg by mouth daily.      . Brinzolamide-Brimonidine (SIMBRINZA) 1-0.2 % SUSP Place 1 drop into the left eye 3 (three) times daily.      .  calcitRIOL (ROCALTROL) 0.25 MCG capsule Take 0.25 mcg by mouth daily.      . Difluprednate (DUREZOL) 0.05 % EMUL Place 1 drop into the left eye every 3 (three) hours. Takes 8 times daily      . folic acid (FOLVITE) 1 MG tablet Take 2 mg by mouth daily.      . furosemide (LASIX) 40 MG tablet Take 1 tablet (40 mg total) by mouth daily.  30 tablet  1  . Garlic [id number] MG CAPS Take 1,000 mg by mouth daily.      . hydrALAZINE (APRESOLINE) 50 MG tablet Take 50 mg by mouth every 8 (eight) hours.       . insulin glargine (LANTUS) 100 UNIT/ML injection Inject 14-16 Units into the skin at bedtime.       . isosorbide mononitrate (IMDUR) 30 MG 24 hr tablet Take 1 tablet (30 mg total) by mouth daily.  30 tablet  1  . linagliptin (TRADJENTA) 5 MG TABS tablet Take 5 mg by mouth daily.      . nebivolol (BYSTOLIC) 5 MG tablet Take 5 mg by mouth daily at 6 PM.       .  Omega-3 Fatty Acids (FISH OIL) 500 MG CAPS Take 500 mg by mouth daily.      . polyethylene glycol (MIRALAX / GLYCOLAX) packet Take 17 g by mouth daily.       . timolol (BETIMOL) 0.5 % ophthalmic solution Place 1 drop into the left eye 2 (two) times daily.       No current facility-administered medications on file prior to visit.    Allergies  Allergen Reactions  . Ace Inhibitors Cough     REVIEW OF SYSTEMS:  (Positives checked otherwise negative)  CARDIOVASCULAR:  []  chest pain, []  chest pressure, []  palpitations, []  shortness of breath when laying flat, []  shortness of breath with exertion,  []  pain in feet when walking, []  pain in feet when laying flat, []  history of blood clot in veins (DVT), []  history of phlebitis, [x]  swelling in legs, []  varicose veins  PULMONARY:  []  productive cough, []  asthma, []  wheezing  NEUROLOGIC:  []  weakness in arms or legs, []  numbness in arms or legs, []  difficulty speaking or slurred speech, []  temporary loss of vision in one eye, []  dizziness  HEMATOLOGIC:  []  bleeding problems, []  problems with blood  clotting too easily  MUSCULOSKEL:  []  joint pain, []  joint swelling  GASTROINTEST:  []  vomiting blood, []  blood in stool     GENITOURINARY:  []  burning with urination, []  blood in urine  PSYCHIATRIC:  []  history of major depression  INTEGUMENTARY:  []  rashes, []  ulcers  CONSTITUTIONAL:  []  fever, []  chills  Physical Examination  Filed Vitals:   03/14/14 1531  BP: 155/85  Pulse: 59  Height: 5\' 9"  (1.753 m)  Weight: 246 lb (111.585 kg)  SpO2: 100%   Body mass index is 36.31 kg/(m^2).  General: A&O x 3, WDWN  Head: [place]/AT  Ear/Nose/Throat: Hearing grossly intact, nares w/o erythema or drainage, oropharynx w/o Erythema/Exudate, Mallampati score: 3  Eyes: PERRLA, EOMI  Neck: Supple, no nuchal rigidity, no palpable LAD  Pulmonary: Sym exp, good air movt, CTAB, no rales, rhonchi, & wheezing  Cardiac: RRR, Nl S1, S2, no Murmurs, rubs or gallops Vascular: Vessel Right Left  Radial Palpable Palpable  Ulnar Palpable Palpable  Brachial Palpable Palpable  Carotid Palpable, without bruit Palpable, without bruit  Aorta Not palpable N/A  Femoral Palpable Palpable  Popliteal Not palpable Not palpable  PT Palpable Palpable  DP Palpable Palpable   Gastrointestinal: soft, NTND, -G/R, - HSM, - masses, - CVAT B  Musculoskeletal: M/S 5/5 throughout , Extremities without ischemic changes   Neurologic: CN 2-12 intact , Pain and light touch intact in extremities , Motor exam as listed above  Psychiatric: Judgment intact, Mood & affect appropriate for pt's clinical situation  Dermatologic: See M/S exam for extremity exam, no rashes otherwise noted  Lymph : No Cervical, Axillary, or Inguinal lymphadenopathy   Non-Invasive Vascular Imaging  Vein Mapping  (Date: 03/14/2014):   R arm: acceptable vein conduits include entire cephalic, upper arm basilic  L arm: acceptable vein conduits include upper arm cephalic, possible forearm cephalic, upper arm basilic  BUE Doppler (Date:  03/14/2014):   R arm:   Brachial: tri, 6.7 mm  Radial: tri 4.3 mm  Ulnar: tri, 2.3 mm  L arm:   Brachial: tri, 6.2 mm  Radial: tri, 4.4 mm  Ulnar: tri, 2.7 mm  Outside Studies/Documentation 4 pages of outside documents were reviewed including: outpatient nephrology chart.  Medical Decision Making  SUMANTH WOODRING is a 63 y.o. male who presents  with chronic kidney disease stage IV   Based on vein mapping and examination, this patient's permanent access options include: all fistula options in both arm.  I would start with a L RC vs BC AVF  I had an extensive discussion with this patient in regards to the nature of access surgery, including risk, benefits, and alternatives.    The patient is aware that the risks of access surgery include but are not limited to: bleeding, infection, steal syndrome, nerve damage, ischemic monomelic neuropathy, failure of access to mature, and possible need for additional access procedures in the future.  The patient has not agreed to proceed with the above procedure.  He is going to discuss things with his nephrologist prior to proceeding.  Adele Barthel, MD [facility] Office: [phone] Pager: [phone]  03/14/2014, 4:06 PM

## 2014-04-03 ENCOUNTER — Ambulatory Visit (INDEPENDENT_AMBULATORY_CARE_PROVIDER_SITE_OTHER): Payer: 59 | Admitting: Cardiovascular Disease

## 2014-04-03 ENCOUNTER — Encounter: Payer: Self-pay | Admitting: Cardiovascular Disease

## 2014-04-03 VITALS — BP 148/82 | HR 67 | Ht 69.0 in | Wt 245.0 lb

## 2014-04-03 DIAGNOSIS — R7989 Other specified abnormal findings of blood chemistry: Secondary | ICD-10-CM

## 2014-04-03 DIAGNOSIS — R778 Other specified abnormalities of plasma proteins: Secondary | ICD-10-CM

## 2014-04-03 DIAGNOSIS — R0600 Dyspnea, unspecified: Secondary | ICD-10-CM

## 2014-04-03 DIAGNOSIS — R9431 Abnormal electrocardiogram [ECG] [EKG]: Secondary | ICD-10-CM

## 2014-04-03 DIAGNOSIS — E785 Hyperlipidemia, unspecified: Secondary | ICD-10-CM

## 2014-04-03 MED ORDER — ISOSORBIDE MONONITRATE ER 30 MG PO TB24: 30.0000 mg | ORAL_TABLET | Freq: Every day | ORAL | Status: DC

## 2014-04-03 MED ORDER — FOLIC ACID 1 MG PO TABS: 2.0000 mg | ORAL_TABLET | Freq: Every day | ORAL | Status: DC

## 2014-04-03 NOTE — Progress Notes (Signed)
Patient ID: Gregory Hood, male   DOB: 05/24/1951, 63 y.o.   MRN: [id number]   62 y.o. Male seen by Dr Gregory Hood and Gregory Hood 8/15   with a history of diabetes, CKD, CVA, hypertension, severe LVH, recent admission for pre-syncope and AKI and left AMA who presented to [facility] today for near syncope and hypotension. His troponin was noted to be elevated ( at 0.30).  Patient presented from his nephrologist's office with episode of near syncope and hypotension. Last night he forgot to take his BP pills and so he took them this AM (he only knows that one was a HCTZ). Stated he did not take his other scheduled medications this morning because he felt a "little woozy" and went to the nephrologist office. He was found to be orthostatic blood pressure was 80 systolic with standing [id number] with sitting. Endorses lightheadedness and fatigue. Denies any chest pain or shortness of breath. Denies any focal weakness, numbness or tingling. Denies any blood in the stool. Recent admission for syncope versus TIA with renal failure which patient left AMA. He states his blood pressure medications have been unchanged until today when his nephrologist decreased his hydralazine dose. Denies any headache, fever or cough. States compliance with medications. 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  In hospital CPK's negative echo normal except for LVH and meds adjusted  Baseline Cr is 3  Seeing Dr Gregory Hood for ? permenent access  Dr Gregory Hood wants preop clearance  Patient is still urinating He is not sure he wants to proceed with surgery Some denial going on with renal failure.  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  All other systems reviewed and  negative   General: Affect appropriate Overweight black male  HEENT: blind in left eye  Neck supple with no adenopathy JVP normal no bruits no thyromegaly Lungs clear with no wheezing and good diaphragmatic motion Heart:  S1/S2 S4 gallop no murmur,rub, gallop or click PMI normal Abdomen: benighn, BS positve, no tenderness, no AAA no bruit.  No HSM or HJR Distal pulses intact with no bruits Plus 2 LE  edema Neuro non-focal Skin warm and dry No muscular weakness  Medications Current Outpatient Prescriptions  Medication Sig Dispense Refill  . aspirin 81 MG tablet Take 162 mg by mouth daily.      Marland Kitchen atorvastatin (LIPITOR) 40 MG tablet Take 40 mg by mouth daily.      . Brinzolamide-Brimonidine (SIMBRINZA) 1-0.2 % SUSP Place 1 drop into the left eye 3 (three) times daily.      . calcitRIOL (ROCALTROL) 0.25 MCG capsule Take 0.25 mcg by mouth daily.      . Difluprednate (DUREZOL) 0.05 % EMUL Place 1 drop into the left eye every 3 (three) hours. Takes 8 times daily      . folic acid (FOLVITE) 1 MG tablet Take 2 mg by mouth daily.      . furosemide (LASIX) 80 MG tablet Take 80 mg by mouth daily.       . Garlic [id number] MG CAPS Take 1,000 mg by mouth daily.      . hydrALAZINE (APRESOLINE) 50 MG tablet Take 50 mg by mouth every 8 (eight) hours.       . insulin glargine (LANTUS) 100 UNIT/ML  injection Inject 14-16 Units into the skin at bedtime.       . isosorbide mononitrate (IMDUR) 30 MG 24 hr tablet Take 1 tablet (30 mg total) by mouth daily.  30 tablet  1  . LANTUS SOLOSTAR 100 UNIT/ML Solostar Pen       . linagliptin (TRADJENTA) 5 MG TABS tablet Take 5 mg by mouth daily.      . nebivolol (BYSTOLIC) 5 MG tablet Take 5 mg by mouth daily at 6 PM.       . Omega-3 Fatty Acids (FISH OIL) 500 MG CAPS Take 500 mg by mouth daily.      Glory Rosebush VERIO test strip       . polyethylene glycol (MIRALAX / GLYCOLAX) packet Take 17 g by mouth daily.       . timolol (BETIMOL) 0.5 % ophthalmic solution Place  1 drop into the left eye 2 (two) times daily.       No current facility-administered medications for this visit.    Allergies Ace inhibitors  Family History: Family History  Problem Relation Age of Onset  . Hypertension Mother   . Diabetes type II Mother   . Transient ischemic attack Mother   . Kidney disease Mother   . Hypertension Father   . Diabetes type II Father   . Diabetes type II Sister   . Hypertension Sister   . Learning disabilities Maternal Uncle     Social History: History   Social History  . Marital Status: Married    Spouse Name: N/A    Number of Children: N/A  . Years of Education: N/A   Occupational History  . Not on file.   Social History Main Topics  . Smoking status: Former Smoker    Types: Cigarettes    Quit date: 03/05/1974  . Smokeless tobacco: Not on file  . Alcohol Use: No  . Drug Use: No  . Sexual Activity: Not on file   Other Topics Concern  . Not on file   Social History Narrative  . No narrative on file    Electrocardiogram:  SR RSR;  LVH with strain and lateral T wave changes   Assessment and Plan

## 2014-04-03 NOTE — Assessment & Plan Note (Signed)
Well controlled.  Continue current medications and low sodium Dash type diet.    

## 2014-04-03 NOTE — Assessment & Plan Note (Signed)
High risk for CAD  Recent hospitalization with labile vitals  Positive troponin and abnormal ECG lateral T wave changes.  Cannot walk on treadmill with cane and peripheral neuropathy.  Needs to have vascular surgery  F/U Lexiscan Myovue

## 2014-04-03 NOTE — Assessment & Plan Note (Signed)
Discussed low carb diet.  Target hemoglobin A1c is 6.5 or less.  Continue current medications. F/U with Dr Lorrene Reid

## 2014-04-03 NOTE — Assessment & Plan Note (Signed)
Cholesterol is at goal.  Continue current dose of statin and diet Rx.  No myalgias or side effects.  F/U  LFT's in 6 months. Lab Results  Component Value Date   LDLCALC 97 02/18/2014

## 2014-04-03 NOTE — Patient Instructions (Addendum)
Your physician wants you to follow-up in:   6 MONTHS WITH DR NISHAN' You will receive a reminder letter in the mail two months in advance. If you don't receive a letter, please call our office to schedule the follow-up appointment. Your physician recommends that you continue on your current medications as directed. Please refer to the Current Medication list given to you today.   Your physician has requested that you have a lexiscan myoview. For further information please visit [email or web address]. Please follow instruction sheet, as given.  

## 2014-04-11 ENCOUNTER — Encounter (HOSPITAL_COMMUNITY): Payer: 59

## 2014-04-11 ENCOUNTER — Encounter: Payer: Self-pay | Admitting: Cardiovascular Disease

## 2014-06-24 ENCOUNTER — Ambulatory Visit (HOSPITAL_COMMUNITY): Payer: 59 | Attending: Cardiology | Admitting: Radiology

## 2014-06-24 VITALS — BP 181/105 | HR 56 | Ht 69.0 in | Wt 243.0 lb

## 2014-06-24 DIAGNOSIS — R55 Syncope and collapse: Secondary | ICD-10-CM | POA: Diagnosis not present

## 2014-06-24 DIAGNOSIS — R778 Other specified abnormalities of plasma proteins: Secondary | ICD-10-CM

## 2014-06-24 DIAGNOSIS — R0609 Other forms of dyspnea: Secondary | ICD-10-CM | POA: Diagnosis present

## 2014-06-24 DIAGNOSIS — E119 Type 2 diabetes mellitus without complications: Secondary | ICD-10-CM | POA: Insufficient documentation

## 2014-06-24 DIAGNOSIS — R7989 Other specified abnormal findings of blood chemistry: Secondary | ICD-10-CM

## 2014-06-24 DIAGNOSIS — R9431 Abnormal electrocardiogram [ECG] [EKG]: Secondary | ICD-10-CM | POA: Insufficient documentation

## 2014-06-24 DIAGNOSIS — I10 Essential (primary) hypertension: Secondary | ICD-10-CM | POA: Insufficient documentation

## 2014-06-24 DIAGNOSIS — R0600 Dyspnea, unspecified: Secondary | ICD-10-CM

## 2014-06-24 DIAGNOSIS — E785 Hyperlipidemia, unspecified: Secondary | ICD-10-CM | POA: Diagnosis not present

## 2014-06-24 DIAGNOSIS — Z794 Long term (current) use of insulin: Secondary | ICD-10-CM | POA: Insufficient documentation

## 2014-06-24 MED ORDER — REGADENOSON 0.4 MG/5ML IV SOLN
0.4000 mg | Freq: Once | INTRAVENOUS | Status: AC
  Administered 2014-06-24: 0.4 mg via INTRAVENOUS

## 2014-06-24 MED ORDER — TECHNETIUM TC 99M SESTAMIBI GENERIC - CARDIOLITE
10.0000 | Freq: Once | INTRAVENOUS | Status: AC | PRN
  Administered 2014-06-24: 10 via INTRAVENOUS

## 2014-06-24 MED ORDER — METOPROLOL SUCCINATE ER 25 MG PO TB24
25.0000 mg | ORAL_TABLET | Freq: Once | ORAL | Status: AC
  Administered 2014-06-24: 25 mg via ORAL

## 2014-06-24 MED ORDER — TECHNETIUM TC 99M SESTAMIBI GENERIC - CARDIOLITE
30.0000 | Freq: Once | INTRAVENOUS | Status: AC | PRN
  Administered 2014-06-24: 30 via INTRAVENOUS

## 2014-06-24 NOTE — Progress Notes (Signed)
[name], [name] [phone]    Cardiology Nuclear Med Study  Gregory Hood is a 63 y.o. male     MRN : [id number]     DOB: Sep 03, 1950  Procedure Date: 06/24/2014  Nuclear Med Background Indication for Stress Test:  Evaluation for Ischemia, Abnormal EKG and Positive Troponin in [facility] 02-22-2014 with HTN History:  No prior known history of CAD, and 12-06-2005 Myocardial Perfusion Imaging-Normal, EF=56% Cardiac Risk Factors: Hypertension, Lipids and IDDM  Symptoms:  DOE and Near Syncope   Nuclear Pre-Procedure Caffeine/Decaff Intake:  None NPO After: 9:00am   Lungs:  clear O2 Sat: 97% on room air. IV 0.9% NS with Angio Cath:  22g  IV Site: R Hand  IV Started by:  Matilde Haymaker, RN  Chest Size (in):  50 Cup Size: n/a  Height: 5\' 9"  (1.753 m)  Weight:  243 lb (110.224 kg)  BMI:  Body mass index is 35.87 kg/(m^2). Tech Comments:  Hydralazine taken at 0900. Bystolic last taken at [id number] yesterday.Last dose Lantus Insulin was 36 hrs ago per patient.  [name], RN. Dr. Mare Ferrari reviewed the images, and the patient history of CKD and recommends Dr. Johnsie Cancel to follow up with the patients' care. Patient notifies to call 911 if any further symptoms. BP 180/100 sitting, BP 160/100 standing HR 72 . The patient discharged via W/C with no symptoms. [name], RN.    Nuclear Med Study 1 or 2 day study: 1 day  Stress Test Type:  [name]  Reading MD: n/a  Order Authorizing Provider:  Verlin Dike   Resting Radionuclide: Technetium 15m Sestamibi  Resting Radionuclide Dose: 11.0 mCi   Stress Radionuclide:  Technetium 4m Sestamibi  Stress Radionuclide Dose: 33.0 mCi           Stress Protocol Rest HR: 56 Stress HR: 86  Rest BP: 181/105 Stress BP: 219/113  Exercise Time (min): n/a METS: n/a   Predicted Max HR: 158 bpm % Max HR: 54.43 bpm Rate Pressure Product: 18834   Dose of Adenosine (mg):  n/a Dose of  Lexiscan: 0.4 mg  Dose of Atropine (mg): n/a Dose of Dobutamine: n/a mcg/kg/min (at max HR)  Stress Test Technologist: [name], RN  Nuclear Technologist:  Earl Many, CNMT     Rest Procedure:  Myocardial perfusion imaging was performed at rest 45 minutes following the intravenous administration of Technetium 43m Sestamibi. Rest ECG: NSR, TWI in inferior and lateral leads.   Stress Procedure:  The patient received IV Lexiscan 0.4 mg over 15-seconds.  Technetium 92m Sestamibi injected at 30-seconds.  The patient complained of nausea and need to urinate then BP dropped from 187/74 to 127/53 with Lexiscan. The patient had seizure -like activity for 20 seconds with a jerking of head, fixed stare and no verbal response. The patient was not incontinent. Dr. [name] in and evaluated the patient. CBG was 101 in recovery.The patient had a marked hypertensive, BP 219/113, response in recovery. Toprol XL 25 mg po given per orders by Dr Mare Ferrari.Quantitative spect images were obtained after a 45 minute delay. Stress ECG: No significant change from baseline ECG  QPS Raw Data Images:  Normal; no motion artifact; normal heart/lung ratio. Stress Images:  There is a medium sized area of moderate attenuation of the mid- basal inferiolateral and inferior walls  Rest Images:  There is a small area of mild attenuation in the mid - basal  inferior and inferolateral  walls.  Subtraction (SDS):  Consistent with a medium sized area of mild ischemia in the inferior and inferiolateral base.  Transient Ischemic Dilatation (Normal <1.22):  1.06 Lung/Heart Ratio (Normal <0.45):  0.38  Quantitative Gated Spect Images QGS EDV:  148 ml QGS ESV:  86 ml  Impression Exercise Capacity:  Lexiscan with no exercise. BP Response:  Normal blood pressure response. Clinical Symptoms:  No significant symptoms noted. ECG Impression:  No significant ST segment change suggestive of ischemia. Comparison with Prior  Nuclear Study: No images to compare  Overall Impression:  Intermediate risk stress nuclear study There is evidence of mild ischemia in the mid-basal inferolateral wall.  The EF has decreased since previous myoview. .  LV Ejection Fraction: 42%.  LV Wall Motion:  moderate hypokinesis of the inferior wall.     Gregory Hood, [name], MD, [facility] 06/24/2014, 4:48 PM [address]  [place] Pager [phone]

## 2014-06-26 ENCOUNTER — Telehealth: Payer: Self-pay | Admitting: *Deleted

## 2014-06-26 ENCOUNTER — Other Ambulatory Visit: Payer: Self-pay | Admitting: *Deleted

## 2014-06-26 DIAGNOSIS — R569 Unspecified convulsions: Secondary | ICD-10-CM

## 2014-06-26 NOTE — Telephone Encounter (Signed)
Entered in error

## 2014-07-01 ENCOUNTER — Ambulatory Visit (INDEPENDENT_AMBULATORY_CARE_PROVIDER_SITE_OTHER): Payer: 59 | Admitting: Neurology

## 2014-07-01 ENCOUNTER — Encounter: Payer: Self-pay | Admitting: Neurology

## 2014-07-01 VITALS — BP 140/78 | HR 58 | Temp 98.6°F | Resp 16 | Ht 69.0 in | Wt 236.6 lb

## 2014-07-01 DIAGNOSIS — I6789 Other cerebrovascular disease: Secondary | ICD-10-CM

## 2014-07-01 DIAGNOSIS — R404 Transient alteration of awareness: Secondary | ICD-10-CM

## 2014-07-01 DIAGNOSIS — Z8673 Personal history of transient ischemic attack (TIA), and cerebral infarction without residual deficits: Secondary | ICD-10-CM

## 2014-07-01 NOTE — Patient Instructions (Signed)
1.  We will set you up for an open MRI of the brain without contrast 2.  We will get an EEG to evaluate brain waves 3.  Follow up soon after testing.

## 2014-07-01 NOTE — Progress Notes (Signed)
NEUROLOGY CONSULTATION NOTE  Gregory Hood MRN: [id number] DOB: 1951-06-28  Referring provider: Dr. Johnsie Cancel Primary care provider: Dr. Mellody Drown  Reason for consult:  Seizure  HISTORY OF PRESENT ILLNESS: Gregory Hood is a 64 year old right-handed man with uncontrolled hypertension, type 2 diabetes mellitus with peripheral neuropathy, dyslipidemia, cerebrovascular disease, CKD stage 4, and history of stroke with left-sided field cut who presents for evaluation of seizures.  Labs and MRI/MRA of head reviewed.  Some records reviewed, however there are no records available specifically addressing his reason for visit.  He is accompanied by his wife who provides some history.  On 06/24/14, he underwent a nuclear stress test.  Right after he was injected, he had an episode where reportedly he had a glazed look in his eyes, lasting a couple of minutes.  His wife was not told if there was any shaking or foaming at the mouth.  He has had two other similar episodes since the summer.  In August, he was standing out in the sun talking for about 45 minutes when he suddenly felt ill and became diaphoretic.  He sat down in the car and suddenly had a glazed look in his  Eyes and began foaming from the mouth.  He did not exhibit any yell, incontinence or convulsions.  It lasted a minute. He was not post-ictal.  He was found to have acute renal insufficiency as well and was treated with IVF.  However, he left AMA.  However, he was readmitted a few days later for pre-syncope and orthostatic hypotension.  In the nephrology office, his systolic blood pressure dropped to 80, likely related to diuresis.  CT of head showed severe cortical atrophy and chronic microvascular ischemic changes but no acute abnormalities.  EKG showed chronic abnormalities, specifically lateral T wave changes. BNP was elevated.  He was evaluated by cardiology.  Troponin was 0.30.  CPKs negative and TTE showed LVH but otherwise unremarkable.  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  He has history of presumed cardio-embolic strokes, for which he has been treated at [facility].  MRI of the brain from 08/24/12 showed multiple territorial infarcts, including the right cerebellum, left occipital lobe and bi-frontal lobes.  MRA of the head was unremarkable.  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  TTE and TEE showed no source of embolus.  30 day loop monitor did not reveal atrial fibrillation.  Transcranial doppler was unremarkable.   06/25/14 LABS:  BUN 40, Cr 3.40, cholesterol 148, TG 75, HDL 37, LDL 96, AIC 7.2 02/18/14 Carotid doppler showed 1-39% bilateral ICA stenosis  Prior MRI of the brain from 03/09/10 showed atrophy and white matter ischemic changes with remote right cerebellar lacunar infarct.  MRA of head showed probable occluded right vertebral artery.  PAST MEDICAL HISTORY: Past Medical History  Diagnosis Date  . Hypertension   . Diabetes mellitus without complication   . TIA (transient ischemic attack)   . Stroke   . Chronic kidney disease   . Hyperlipidemia   . Obesity   . OSA on CPAP   . Retinal detachment   . Diabetic retinopathy   . Umbilical hernia   . Seizures     PAST SURGICAL HISTORY: No past surgical history on file.  MEDICATIONS: Current Outpatient Prescriptions on File Prior to Visit  Medication Sig Dispense Refill  . aspirin 81 MG tablet  Take 162 mg by mouth daily.    Marland Kitchen atorvastatin (LIPITOR) 40 MG tablet Take 40 mg by mouth daily.    . Brinzolamide-Brimonidine (SIMBRINZA) 1-0.2 % SUSP Place 1 drop into the left eye 3 (three) times daily.    . calcitRIOL (ROCALTROL) 0.25 MCG capsule Take 0.25 mcg by mouth daily.    . Difluprednate (DUREZOL) 0.05 % EMUL Place 1 drop into the left eye every 3 (three) hours. Takes 8 times daily    . folic acid (FOLVITE) 1 MG tablet Take 2 tablets (2  mg total) by mouth daily. 180 tablet 3  . furosemide (LASIX) 80 MG tablet Take 80 mg by mouth daily.     . Garlic 123XX123 MG CAPS Take 1,000 mg by mouth daily.    . hydrALAZINE (APRESOLINE) 50 MG tablet Take 50 mg by mouth every 8 (eight) hours.     . insulin glargine (LANTUS) 100 UNIT/ML injection Inject 14-16 Units into the skin at bedtime.     . isosorbide mononitrate (IMDUR) 30 MG 24 hr tablet Take 1 tablet (30 mg total) by mouth daily. 90 tablet 3  . LANTUS SOLOSTAR 100 UNIT/ML Solostar Pen     . linagliptin (TRADJENTA) 5 MG TABS tablet Take 5 mg by mouth daily.    . nebivolol (BYSTOLIC) 5 MG tablet Take 5 mg by mouth daily at 6 PM.     . Omega-3 Fatty Acids (FISH OIL) 500 MG CAPS Take 500 mg by mouth daily.    Glory Rosebush VERIO test strip     . polyethylene glycol (MIRALAX / GLYCOLAX) packet Take 17 g by mouth daily.     . timolol (BETIMOL) 0.5 % ophthalmic solution Place 1 drop into the left eye 2 (two) times daily.     No current facility-administered medications on file prior to visit.    ALLERGIES: Allergies  Allergen Reactions  . Lexiscan [Regadenoson] Other (See Comments)    Seizure like-activity after lexiscan given.  . Ace Inhibitors Cough    FAMILY HISTORY: Family History  Problem Relation Age of Onset  . Hypertension Mother   . Diabetes type II Mother   . Transient ischemic attack Mother   . Kidney disease Mother   . Hypertension Father   . Diabetes type II Father   . Diabetes type II Sister   . Hypertension Sister   . Learning disabilities Maternal Uncle     SOCIAL HISTORY: History   Social History  . Marital Status: Married    Spouse Name: N/A    Number of Children: N/A  . Years of Education: N/A   Occupational History  . Not on file.   Social History Main Topics  . Smoking status: Former Smoker    Types: Cigarettes    Quit date: 03/05/1974  . Smokeless tobacco: Never Used  . Alcohol Use: No  . Drug Use: No  . Sexual Activity: No   Other  Topics Concern  . Not on file   Social History Narrative    REVIEW OF SYSTEMS: Constitutional: No fevers, chills, or sweats, no generalized fatigue, change in appetite Eyes: No visual changes, double vision, eye pain Ear, nose and throat: No hearing loss, ear pain, nasal congestion, sore throat Cardiovascular: No chest pain, palpitations Respiratory:  No shortness of breath at rest or with exertion, wheezes GastrointestinaI: No nausea, vomiting, diarrhea, abdominal pain, fecal incontinence Genitourinary:  No dysuria, urinary retention or frequency Musculoskeletal:  No neck pain, back pain Integumentary: No rash, pruritus, skin  lesions Neurological: as above Psychiatric: No depression, insomnia, anxiety Endocrine: No palpitations, fatigue, diaphoresis, mood swings, change in appetite, change in weight, increased thirst Hematologic/Lymphatic:  No anemia, purpura, petechiae. Allergic/Immunologic: no itchy/runny eyes, nasal congestion, recent allergic reactions, rashes  PHYSICAL EXAM: Filed Vitals:   07/01/14 1301  BP: 140/78  Pulse: 58  Temp: 98.6 F (37 C)  Resp: 16   General: No acute distress Head:  Normocephalic/atraumatic Eyes:  Fundi not visualized. Neck: supple, no paraspinal tenderness, full range of motion Back: No paraspinal tenderness Heart: regular rate and rhythm Lungs: Clear to auscultation bilaterally. Vascular: No carotid bruits. Neurological Exam: Mental status: alert and oriented to person, place, and time, recent and remote memory intact, fund of knowledge intact, attention and concentration intact, speech fluent and not dysarthric, language intact. Cranial nerves: CN I: not tested CN II: pupils equal, round and nonreactive to light, right homonymous hemianopsia, fundi not visualized on exam CN III, IV, VI:  full range of motion, no nystagmus, no ptosis CN V: facial sensation intact CN VII: upper and lower face symmetric CN VIII: hearing intact CN IX,  X: gag intact, uvula midline CN XI: sternocleidomastoid and trapezius muscles intact CN XII: tongue midline Bulk & Tone: normal, no fasciculations. Motor:  5/5 throughout Sensation:  Reduced vibration in feet.  Pinprick intact. Deep Tendon Reflexes:  2+ throughout except absent in ankles.  Toes downgoing. Finger to nose testing:  No dysmetria Gait:  Wide-based wobbling gait. Romberg with sway.  IMPRESSION: Recurrent transient altered awareness Cerebrovascular disease History of presumed cardioembolic stroke   PLAN: 1.  Continue ASA 81mg  daily.  As there was never any clear cardiac source found, anticoagulation would not typically be started. 2.  Continue statin (LDL goal should be less than 70) 3.  Will get MRI of brain without contrast 4.  Will get EEG 5.  Follow up after testing.  Thank you for allowing me to take part in the care of this patient.  Metta Clines, DO  CC:  Jilda Panda, MD  Jenkins Rouge, MD

## 2014-07-02 ENCOUNTER — Encounter: Payer: Self-pay | Admitting: *Deleted

## 2014-07-02 NOTE — Progress Notes (Signed)
Dr. Bridgett Larsson received at letter today from Dr. Lorrene Reid regarding patient is unwilling to have AVF at this time. Patient and wife are evidently questioning the need for dialysis and patient has not been cleared by cardiology. We are still waiting for patient to call us to schedule Left radiocephalic vs brachiocephalic AVF as per Dr. Lianne Moris 03-14-14 office note. Dr. Sanda Klein letter will be scanned into our EPIC system.

## 2014-07-03 ENCOUNTER — Ambulatory Visit (INDEPENDENT_AMBULATORY_CARE_PROVIDER_SITE_OTHER): Payer: 59 | Admitting: Neurology

## 2014-07-03 ENCOUNTER — Telehealth: Payer: Self-pay | Admitting: *Deleted

## 2014-07-03 DIAGNOSIS — I6789 Other cerebrovascular disease: Secondary | ICD-10-CM

## 2014-07-03 DIAGNOSIS — Z8673 Personal history of transient ischemic attack (TIA), and cerebral infarction without residual deficits: Secondary | ICD-10-CM

## 2014-07-03 DIAGNOSIS — R404 Transient alteration of awareness: Secondary | ICD-10-CM

## 2014-07-03 NOTE — Telephone Encounter (Signed)
-----   Message from Dudley Major, DO sent at 07/03/2014 12:27 PM EST ----- eeg normal ----- Message -----    From: Dudley Major, DO    Sent: 07/03/2014  12:25 PM      To: Dudley Major, DO

## 2014-07-03 NOTE — Telephone Encounter (Signed)
Patient is aware of normal eeg

## 2014-07-03 NOTE — Procedures (Signed)
ELECTROENCEPHALOGRAM REPORT  Date of Study: 07/03/2014  Patient's Name: Gregory Hood MRN: [id number] Date of Birth: [birth year]  Indication: Laken Fincannon is a 64 year old right-handed man with uncontrolled hypertension, type 2 diabetes mellitus with peripheral neuropathy, dyslipidemia, cerebrovascular disease, CKD stage 4, and history of stroke who presents for recurrent episodes of altered awareness.  Medications: Current Outpatient Prescriptions on File Prior to Visit  Medication Sig Dispense Refill  . aspirin 81 MG tablet Take 162 mg by mouth daily.    Marland Kitchen atorvastatin (LIPITOR) 40 MG tablet Take 40 mg by mouth daily.    . Brinzolamide-Brimonidine (SIMBRINZA) 1-0.2 % SUSP Place 1 drop into the left eye 3 (three) times daily.    . calcitRIOL (ROCALTROL) 0.25 MCG capsule Take 0.25 mcg by mouth daily.    . Difluprednate (DUREZOL) 0.05 % EMUL Place 1 drop into the left eye every 3 (three) hours. Takes 8 times daily    . folic acid (FOLVITE) 1 MG tablet Take 2 tablets (2 mg total) by mouth daily. 180 tablet 3  . furosemide (LASIX) 80 MG tablet Take 80 mg by mouth daily.     . Garlic 123XX123 MG CAPS Take 1,000 mg by mouth daily.    . hydrALAZINE (APRESOLINE) 50 MG tablet Take 50 mg by mouth every 8 (eight) hours.     . insulin glargine (LANTUS) 100 UNIT/ML injection Inject 14-16 Units into the skin at bedtime.     . isosorbide mononitrate (IMDUR) 30 MG 24 hr tablet Take 1 tablet (30 mg total) by mouth daily. 90 tablet 3  . LANTUS SOLOSTAR 100 UNIT/ML Solostar Pen     . linagliptin (TRADJENTA) 5 MG TABS tablet Take 5 mg by mouth daily.    . nebivolol (BYSTOLIC) 5 MG tablet Take 5 mg by mouth daily at 6 PM.     . Omega-3 Fatty Acids (FISH OIL) 500 MG CAPS Take 500 mg by mouth daily.    Glory Rosebush VERIO test strip     . polyethylene glycol (MIRALAX / GLYCOLAX) packet Take 17 g by mouth daily.     . timolol (BETIMOL) 0.5 % ophthalmic solution Place 1 drop into the left eye 2 (two) times daily.     No  current facility-administered medications on file prior to visit.     Technical Summary: This is a multichannel digital EEG recording, using the international 10-20 placement system.  Spike detection software was employed.  Description: The EEG background is symmetric, with a well-developed posterior dominant rhythm of 8 Hz, which is reactive to eye opening and closing.  Diffuse beta activity is seen, with a bilateral frontal preponderance.  No focal or generalized abnormalities are seen.  No focal or generalized epileptiform discharges are seen.  Stage II sleep is seen, with normal and symmetric sleep patterns.  Hyperventilation and photic stimulation were performed, and produced no abnormalities.  ECG revealed normal cardiac rate and rhythm.  Impression: This is a normal routine EEG of the awake and asleep states, with activating procedures.  A normal study does not rule out the possibility of a seizure disorder in this patient.  Adam R. Tomi Likens, DO

## 2014-07-09 ENCOUNTER — Telehealth: Payer: Self-pay | Admitting: Neurology

## 2014-07-09 NOTE — Telephone Encounter (Signed)
Gregory Hood, pt's wife called wanting to f/u on the results for the MRI and EEG. She also wants to confirm if the pt needs to return for a f/u C/b [phone]

## 2014-07-22 ENCOUNTER — Ambulatory Visit: Payer: 59 | Admitting: Neurology

## 2014-07-24 ENCOUNTER — Telehealth: Payer: Self-pay | Admitting: *Deleted

## 2014-07-24 NOTE — Telephone Encounter (Signed)
See previous documentation. The pt no showed 07/22/14 appt w/ Dr. Tomi Likens. It looks like the pt called earlier in the month to determine if follow up is needed. I do not see documentation where we contacted this pt in regards to the follow up appt or MRI results. I will forward this to [facility] and have her follow up. / Sherri

## 2014-07-24 NOTE — Telephone Encounter (Signed)
Patient no showed for his appt on 07/22/14 does he need a follow up visit

## 2014-07-24 NOTE — Telephone Encounter (Signed)
Patient wife IS aware of MRI findings  she was the next day  as well as the EEG  they appt was given for 08/05/14 9:45am

## 2014-08-05 ENCOUNTER — Ambulatory Visit: Payer: 59 | Admitting: Neurology

## 2014-08-05 DIAGNOSIS — Z029 Encounter for administrative examinations, unspecified: Secondary | ICD-10-CM

## 2014-08-06 ENCOUNTER — Ambulatory Visit: Payer: 59 | Admitting: Neurology

## 2014-08-07 ENCOUNTER — Telehealth: Payer: Self-pay | Admitting: Neurology

## 2014-08-07 NOTE — Telephone Encounter (Signed)
See Susie's separate documentation, on seperate 07/24/14 telephone encounter / Venida Jarvis

## 2014-08-07 NOTE — Telephone Encounter (Signed)
Pt no showed 08/05/14 appt w/ Dr. Tomi Likens. No show letter + policy mailed to pt / Sherri S.

## 2014-09-23 ENCOUNTER — Encounter (HOSPITAL_COMMUNITY): Payer: Self-pay | Admitting: Emergency Medicine

## 2014-09-23 DIAGNOSIS — Z9981 Dependence on supplemental oxygen: Secondary | ICD-10-CM | POA: Insufficient documentation

## 2014-09-23 DIAGNOSIS — Z79899 Other long term (current) drug therapy: Secondary | ICD-10-CM | POA: Diagnosis not present

## 2014-09-23 DIAGNOSIS — Z794 Long term (current) use of insulin: Secondary | ICD-10-CM | POA: Diagnosis not present

## 2014-09-23 DIAGNOSIS — I129 Hypertensive chronic kidney disease with stage 1 through stage 4 chronic kidney disease, or unspecified chronic kidney disease: Secondary | ICD-10-CM | POA: Insufficient documentation

## 2014-09-23 DIAGNOSIS — Z87891 Personal history of nicotine dependence: Secondary | ICD-10-CM | POA: Insufficient documentation

## 2014-09-23 DIAGNOSIS — E11319 Type 2 diabetes mellitus with unspecified diabetic retinopathy without macular edema: Secondary | ICD-10-CM | POA: Diagnosis not present

## 2014-09-23 DIAGNOSIS — Z7982 Long term (current) use of aspirin: Secondary | ICD-10-CM | POA: Diagnosis not present

## 2014-09-23 DIAGNOSIS — R339 Retention of urine, unspecified: Secondary | ICD-10-CM | POA: Diagnosis present

## 2014-09-23 DIAGNOSIS — E785 Hyperlipidemia, unspecified: Secondary | ICD-10-CM | POA: Diagnosis not present

## 2014-09-23 DIAGNOSIS — Z8673 Personal history of transient ischemic attack (TIA), and cerebral infarction without residual deficits: Secondary | ICD-10-CM | POA: Diagnosis not present

## 2014-09-23 DIAGNOSIS — K5900 Constipation, unspecified: Secondary | ICD-10-CM | POA: Insufficient documentation

## 2014-09-23 DIAGNOSIS — N189 Chronic kidney disease, unspecified: Secondary | ICD-10-CM | POA: Diagnosis not present

## 2014-09-23 DIAGNOSIS — G4733 Obstructive sleep apnea (adult) (pediatric): Secondary | ICD-10-CM | POA: Insufficient documentation

## 2014-09-23 DIAGNOSIS — E669 Obesity, unspecified: Secondary | ICD-10-CM | POA: Diagnosis not present

## 2014-09-23 NOTE — ED Notes (Signed)
Pt reports last BM was this morning and last urination was at 6pm.  Pt reports tonight before bed he tried to use the bathroom and couldn't move bowel or bladder.  Denies pain or discomfort.

## 2014-09-24 ENCOUNTER — Emergency Department (HOSPITAL_COMMUNITY)
Admission: EM | Admit: 2014-09-24 | Discharge: 2014-09-24 | Disposition: A | Discharge status: Home / Self Care | Payer: 59 | Attending: Emergency Medicine | Admitting: Emergency Medicine

## 2014-09-24 DIAGNOSIS — R339 Retention of urine, unspecified: Secondary | ICD-10-CM

## 2014-09-24 LAB — URINALYSIS, ROUTINE W REFLEX MICROSCOPIC
Bilirubin Urine: NEGATIVE
Glucose, UA: NEGATIVE mg/dL
Hgb urine dipstick: NEGATIVE
Ketones, ur: NEGATIVE mg/dL
Leukocytes, UA: NEGATIVE
Nitrite: NEGATIVE
Protein, ur: >300 mg/dL — AB
Specific Gravity, Urine: 1.018 (ref 1.005–1.030)
Urobilinogen, UA: 0.2 mg/dL (ref 0.0–1.0)
pH: 5 (ref 5.0–8.0)

## 2014-09-24 LAB — BASIC METABOLIC PANEL
Anion gap: 9 (ref 5–15)
BUN: 54 mg/dL — ABNORMAL HIGH (ref 6–23)
CO2: 26 mmol/L (ref 19–32)
Calcium: 9.1 mg/dL (ref 8.4–10.5)
Chloride: 100 mmol/L (ref 96–112)
Creatinine, Ser: 3.67 mg/dL — ABNORMAL HIGH (ref 0.50–1.35)
GFR calc Af Amer: 19 mL/min — ABNORMAL LOW (ref >90)
GFR calc non Af Amer: 16 mL/min — ABNORMAL LOW (ref >90)
Glucose, Bld: 148 mg/dL — ABNORMAL HIGH (ref 70–99)
Potassium: 4 mmol/L (ref 3.5–5.1)
Sodium: 135 mmol/L (ref 135–145)

## 2014-09-24 LAB — CBC WITH DIFFERENTIAL/PLATELET
BASOS PCT: 0 % (ref 0–1)
Basophils Absolute: 0 10*3/uL (ref 0.0–0.1)
EOS ABS: 0.1 10*3/uL (ref 0.0–0.7)
Eosinophils Relative: 2 % (ref 0–5)
HCT: 36.7 % — ABNORMAL LOW (ref 39.0–52.0)
Hemoglobin: 12.1 g/dL — ABNORMAL LOW (ref 13.0–17.0)
LYMPHS ABS: 1.2 10*3/uL (ref 0.7–4.0)
LYMPHS PCT: 16 % (ref 12–46)
MCH: 27.9 pg (ref 26.0–34.0)
MCHC: 33 g/dL (ref 30.0–36.0)
MCV: 84.8 fL (ref 78.0–100.0)
MONO ABS: 0.9 10*3/uL (ref 0.1–1.0)
Monocytes Relative: 12 % (ref 3–12)
Neutro Abs: 5.3 10*3/uL (ref 1.7–7.7)
Neutrophils Relative %: 70 % (ref 43–77)
PLATELETS: 178 10*3/uL (ref 150–400)
RBC: 4.33 MIL/uL (ref 4.22–5.81)
RDW: 13 % (ref 11.5–15.5)
WBC: 7.5 10*3/uL (ref 4.0–10.5)

## 2014-09-24 LAB — URINE MICROSCOPIC-ADD ON

## 2014-09-24 MED ORDER — POLYETHYLENE GLYCOL 3350 17 GM/SCOOP PO POWD: 17.0000 g | Freq: Two times a day (BID) | ORAL | Status: DC

## 2014-09-24 NOTE — ED Notes (Signed)
Bladder scanner completed with 219ml found.

## 2014-09-24 NOTE — Discharge Instructions (Signed)
MiraLAX as prescribed.  Return to the emergency department if you develop any new and concerning symptoms.   Acute Urinary Retention Acute urinary retention is the temporary inability to urinate. This is a common problem in older men. As men age their prostates become larger and block the flow of urine from the bladder. This is usually a problem that has come on gradually.  HOME CARE INSTRUCTIONS If you are sent home with a Foley catheter and a drainage system, you will need to discuss the best course of action with your health care provider. While the catheter is in, maintain a good intake of fluids. Keep the drainage bag emptied and lower than your catheter. This is so that contaminated urine will not flow back into your bladder, which could lead to a urinary tract infection. There are two main types of drainage bags. One is a large bag that usually is used at night. It has a good capacity that will allow you to sleep through the night without having to empty it. The second type is called a leg bag. It has a smaller capacity, so it needs to be emptied more frequently. However, the main advantage is that it can be attached by a leg strap and can go underneath your clothing, allowing you the freedom to move about or leave your home. Only take over-the-counter or prescription medicines for pain, discomfort, or fever as directed by your health care provider.  SEEK MEDICAL CARE IF:  You develop a low-grade fever.  You experience spasms or leakage of urine with the spasms. SEEK IMMEDIATE MEDICAL CARE IF:   You develop chills or fever.  Your catheter stops draining urine.  Your catheter falls out.  You start to develop increased bleeding that does not respond to rest and increased fluid intake. MAKE SURE YOU:  Understand these instructions.  Will watch your condition.  Will get help right away if you are not doing well or get worse. Document Released: 09/20/2000 Document Revised:  06/19/2013 Document Reviewed: 11/23/2012 [facility] Patient Information 2015 [place], [place]. This information is not intended to replace advice given to you by your health care provider. Make sure you discuss any questions you have with your health care provider.

## 2014-09-24 NOTE — ED Notes (Signed)
Pt sts "I feel like I need to have a bowel movement and to urinate but I just can't".

## 2014-09-24 NOTE — ED Provider Notes (Signed)
CSN: [id number]     Arrival date & time 09/23/14  2325 History  This chart was scribed for Gregory Speak, MD by Randa Evens, ED Scribe. This patient was seen in room A13C/A13C and the patient's care was started at 1:28 AM.    Chief Complaint  Patient presents with  . Urinary Retention   The history is provided by the patient. No language interpreter was used.   HPI Comments: Gregory Hood is a 64 y.o. male with PMHx chronic kidney disease who presents to the Emergency Department complaining of difficulty urinating onset 1 day ago. Pt reports slight constipation. Pt states that his last BM was this morning. Pt states that he has tried an at home remedy with no relief. Pt reports Hx of constipation that has improved after having an enema. Wife states that he recently had retina surgery 3 days ago. Pt denies abdominal pain, fever or other related symptoms. Wife states that he has a Hx kidney disease and states that his kidney function is about 25%.    Past Medical History  Diagnosis Date  . Hypertension   . Diabetes mellitus without complication   . TIA (transient ischemic attack)   . Stroke   . Chronic kidney disease   . Hyperlipidemia   . Obesity   . OSA on CPAP   . Retinal detachment   . Diabetic retinopathy   . Umbilical hernia   . Seizures    History reviewed. No pertinent past surgical history. Family History  Problem Relation Age of Onset  . Hypertension Mother   . Diabetes type II Mother   . Transient ischemic attack Mother   . Kidney disease Mother   . Hypertension Father   . Diabetes type II Father   . Diabetes type II Sister   . Hypertension Sister   . Learning disabilities Maternal Uncle    History  Substance Use Topics  . Smoking status: Former Smoker    Types: Cigarettes    Quit date: 03/05/1974  . Smokeless tobacco: Never Used  . Alcohol Use: No    Review of Systems  Constitutional: Negative for fever.  Gastrointestinal: Positive for constipation.  Negative for abdominal pain.  Genitourinary: Positive for frequency and difficulty urinating.  All other systems reviewed and are negative.    Allergies  Lexiscan and Ace inhibitors  Home Medications   Prior to Admission medications   Medication Sig Start Date End Date Taking? Authorizing Provider  Ascorbic Acid (VITAMIN C PO) Take 1 tablet by mouth daily.   Yes Historical Provider, MD  aspirin 81 MG tablet Take 162 mg by mouth daily.   Yes Historical Provider, MD  atorvastatin (LIPITOR) 40 MG tablet Take 40 mg by mouth daily.   Yes Historical Provider, MD  atropine 1 % ophthalmic solution Place 1 drop into the right eye 2 (two) times daily.   Yes Historical Provider, MD  calcitRIOL (ROCALTROL) 0.25 MCG capsule Take 0.25 mcg by mouth daily.   Yes Historical Provider, MD  Cholecalciferol (VITAMIN D PO) Take 1 tablet by mouth daily.   Yes Historical Provider, MD  ciprofloxacin (CILOXAN) 0.3 % ophthalmic solution Place 1 drop into the right eye 4 (four) times daily. Administer 1 drop, every 2 hours, while awake, for 2 days. Then 1 drop, every 4 hours, while awake, for the next 5 days.   Yes Historical Provider, MD  erythromycin ophthalmic ointment Place 1 application into the right eye at bedtime.   Yes Historical Provider, MD  folic acid (FOLVITE) 1 MG tablet Take 2 tablets (2 mg total) by mouth daily. 04/03/14  Yes Josue Hector, MD  furosemide (LASIX) 80 MG tablet Take 80 mg by mouth daily.  01/25/14  Yes Historical Provider, MD  Garlic 123XX123 MG CAPS Take 1,000 mg by mouth daily.   Yes Historical Provider, MD  hydrALAZINE (APRESOLINE) 50 MG tablet Take 50 mg by mouth every 8 (eight) hours.    Yes Historical Provider, MD  insulin glargine (LANTUS) 100 UNIT/ML injection Inject 12 Units into the skin at bedtime.    Yes Historical Provider, MD  isosorbide mononitrate (IMDUR) 30 MG 24 hr tablet Take 1 tablet (30 mg total) by mouth daily. 04/03/14  Yes Josue Hector, MD  linagliptin (TRADJENTA) 5  MG TABS tablet Take 5 mg by mouth daily.   Yes Historical Provider, MD  nebivolol (BYSTOLIC) 5 MG tablet Take 5 mg by mouth daily at 6 PM.    Yes Historical Provider, MD  Omega-3 Fatty Acids (FISH OIL) 500 MG CAPS Take 500 mg by mouth daily.   Yes Historical Provider, MD  prednisoLONE acetate (PRED FORTE) 1 % ophthalmic suspension Place 1 drop into both eyes 4 (four) times daily.   Yes Historical Provider, MD  [facility] VERIO test strip  01/08/14   Historical Provider, MD   BP 143/76 mmHg  Pulse 59  Temp(Src) 98.3 F (36.8 C) (Oral)  Resp 16  Ht 5\' 9"  (1.753 m)  Wt 227 lb (102.967 kg)  BMI 33.51 kg/m2  SpO2 100%   Physical Exam  Constitutional: He is oriented to person, place, and time. He appears well-developed and well-nourished. No distress.  HENT:  Head: Normocephalic and atraumatic.  Eyes: Conjunctivae and EOM are normal.  Neck: Neck supple. No tracheal deviation present.  Cardiovascular: Normal rate.   Pulmonary/Chest: Effort normal. No respiratory distress.  Abdominal: Soft. There is no tenderness.  Genitourinary: Rectum normal and prostate normal.  No fecal impaction present. Chaperone present.   Musculoskeletal: Normal range of motion.  Neurological: He is alert and oriented to person, place, and time.  Skin: Skin is warm and dry.  Psychiatric: He has a normal mood and affect. His behavior is normal.  Nursing note and vitals reviewed.   ED Course  Procedures (including critical care time) DIAGNOSTIC STUDIES: Oxygen Saturation is 97% on RA, normal by my interpretation.    COORDINATION OF CARE: 1:34 AM-Discussed treatment plan with pt at bedside and pt agreed to plan.     Labs Review Labs Reviewed  BASIC METABOLIC PANEL - Abnormal; Notable for the following:    Glucose, Bld 148 (*)    BUN 54 (*)    Creatinine, Ser 3.67 (*)    GFR calc non Af Amer 16 (*)    GFR calc Af Amer 19 (*)    All other components within normal limits  CBC WITH DIFFERENTIAL/PLATELET -  Abnormal; Notable for the following:    Hemoglobin 12.1 (*)    HCT 36.7 (*)    All other components within normal limits  URINALYSIS, ROUTINE W REFLEX MICROSCOPIC - Abnormal; Notable for the following:    APPearance CLOUDY (*)    Protein, ur >300 (*)    All other components within normal limits  URINE MICROSCOPIC-ADD ON - Abnormal; Notable for the following:    Bacteria, UA FEW (*)    Casts HYALINE CASTS (*)    All other components within normal limits    Imaging Review No results found.  EKG Interpretation None      MDM   Final diagnoses:  None   Patient presents with retention of stool and urine. Rectal examination reveals no evidence for a fecal impaction and Foley catheter placement only obtained 300 mL of urine. His laboratory studies reveal renal insufficiency which is slightly worse, but consistent with his baseline. The Foley catheter will be removed and the patient will be discharged to home. He has requested a refill of his MiraLAX which I have provided. Anderson and to return if his symptoms significantly worsen or change.   I personally performed the services described in this documentation, which was scribed in my presence. The recorded information has been reviewed and is accurate.      Gregory Speak, MD 09/24/14 [id number]

## 2014-11-07 ENCOUNTER — Other Ambulatory Visit: Payer: Self-pay

## 2014-11-07 DIAGNOSIS — Z01810 Encounter for preprocedural cardiovascular examination: Secondary | ICD-10-CM

## 2014-11-07 DIAGNOSIS — N184 Chronic kidney disease, stage 4 (severe): Secondary | ICD-10-CM

## 2014-11-28 ENCOUNTER — Other Ambulatory Visit (HOSPITAL_COMMUNITY): Payer: 59

## 2014-11-28 ENCOUNTER — Encounter (HOSPITAL_COMMUNITY): Payer: 59

## 2014-11-28 ENCOUNTER — Ambulatory Visit: Payer: 59 | Admitting: Vascular Surgery

## 2014-12-09 ENCOUNTER — Encounter: Payer: Self-pay | Admitting: Vascular Surgery

## 2014-12-11 ENCOUNTER — Ambulatory Visit: Payer: 59 | Admitting: Vascular Surgery

## 2014-12-11 ENCOUNTER — Encounter (HOSPITAL_COMMUNITY): Payer: 59

## 2014-12-11 ENCOUNTER — Other Ambulatory Visit (HOSPITAL_COMMUNITY): Payer: 59

## 2014-12-24 ENCOUNTER — Encounter: Payer: Self-pay | Admitting: Vascular Surgery

## 2014-12-25 ENCOUNTER — Ambulatory Visit: Payer: 59 | Admitting: Vascular Surgery

## 2014-12-25 ENCOUNTER — Inpatient Hospital Stay (HOSPITAL_COMMUNITY): Admission: RE | Admit: 2014-12-25 | Payer: 59 | Source: Ambulatory Visit

## 2015-01-17 ENCOUNTER — Encounter: Payer: Self-pay | Admitting: Vascular Surgery

## 2015-01-22 ENCOUNTER — Telehealth: Payer: Self-pay

## 2015-01-22 ENCOUNTER — Ambulatory Visit (INDEPENDENT_AMBULATORY_CARE_PROVIDER_SITE_OTHER): Payer: 59 | Admitting: Vascular Surgery

## 2015-01-22 ENCOUNTER — Ambulatory Visit (INDEPENDENT_AMBULATORY_CARE_PROVIDER_SITE_OTHER)
Admission: RE | Admit: 2015-01-22 | Discharge: 2015-01-22 | Disposition: A | Discharge status: Home / Self Care | Payer: 59 | Source: Ambulatory Visit | Attending: Vascular Surgery | Admitting: Vascular Surgery

## 2015-01-22 ENCOUNTER — Encounter: Payer: Self-pay | Admitting: Vascular Surgery

## 2015-01-22 ENCOUNTER — Ambulatory Visit (HOSPITAL_COMMUNITY)
Admission: RE | Admit: 2015-01-22 | Discharge: 2015-01-22 | Disposition: A | Discharge status: Home / Self Care | Payer: 59 | Source: Ambulatory Visit | Attending: Vascular Surgery | Admitting: Vascular Surgery

## 2015-01-22 VITALS — BP 178/104 | HR 66 | Ht 69.0 in | Wt 235.0 lb

## 2015-01-22 DIAGNOSIS — Z01810 Encounter for preprocedural cardiovascular examination: Secondary | ICD-10-CM

## 2015-01-22 DIAGNOSIS — N184 Chronic kidney disease, stage 4 (severe): Secondary | ICD-10-CM | POA: Diagnosis not present

## 2015-01-22 NOTE — Progress Notes (Signed)
HISTORY AND PHYSICAL   History of Present Illness:  Patient is a 64 y.o. year old male who presents for placement of a permanent hemodialysis access. The patient is right handed.  He has a Cr of 3.67 and GFR of 19.  He is in stage IV CKD.   The patient is not currently on hemodialysis.  The patient has never had a PPM placed. Past medical history includes: hypertension managed with Hydralizine and Nebivolol, hypercholesterolemia managed with Lipitor, and DM managed with Insulin.  He does have a history of TIA in 2014 fully recovered.  He is currently not on anticoagulation medication.  He does take a daily 81 mg Aspirin.    Past Medical History  Diagnosis Date  . Hypertension   . Diabetes mellitus without complication   . TIA (transient ischemic attack)   . Stroke   . Chronic kidney disease   . Hyperlipidemia   . Obesity   . OSA on CPAP   . Retinal detachment   . Diabetic retinopathy   . Umbilical hernia   . Seizures   . COPD (chronic obstructive pulmonary disease)     History reviewed. No pertinent past surgical history.   Social History History  Substance Use Topics  . Smoking status: Former Smoker    Types: Cigarettes    Quit date: 03/05/1974  . Smokeless tobacco: Never Used  . Alcohol Use: No    Family History Family History  Problem Relation Age of Onset  . Hypertension Mother   . Diabetes type II Mother   . Transient ischemic attack Mother   . Kidney disease Mother   . Diabetes Mother   . Varicose Veins Mother   . Hypertension Father   . Diabetes type II Father   . Diabetes Father   . Diabetes type II Sister   . Hypertension Sister   . Diabetes Sister   . Learning disabilities Maternal Uncle     Allergies  Allergies  Allergen Reactions  . Lexiscan [Regadenoson] Other (See Comments)    Seizure like-activity after lexiscan given.  . Ace Inhibitors Cough     Current Outpatient Prescriptions  Medication Sig Dispense Refill  . Ascorbic Acid  (VITAMIN C PO) Take 1 tablet by mouth daily.    Marland Kitchen aspirin 81 MG tablet Take 162 mg by mouth daily.    Marland Kitchen atorvastatin (LIPITOR) 40 MG tablet Take 40 mg by mouth daily.    Marland Kitchen atropine 1 % ophthalmic solution Place 1 drop into the right eye 2 (two) times daily.    . calcitRIOL (ROCALTROL) 0.25 MCG capsule Take 0.25 mcg by mouth daily.    . Cholecalciferol (VITAMIN D PO) Take 1 tablet by mouth daily.    . folic acid (FOLVITE) 1 MG tablet Take 2 tablets (2 mg total) by mouth daily. 180 tablet 3  . furosemide (LASIX) 80 MG tablet Take 80 mg by mouth daily.     . Garlic 123XX123 MG CAPS Take 1,000 mg by mouth daily.    . hydrALAZINE (APRESOLINE) 50 MG tablet Take 50 mg by mouth every 8 (eight) hours.     . insulin glargine (LANTUS) 100 UNIT/ML injection Inject 12 Units into the skin at bedtime.     . isosorbide mononitrate (IMDUR) 30 MG 24 hr tablet Take 1 tablet (30 mg total) by mouth daily. 90 tablet 3  . linagliptin (TRADJENTA) 5 MG TABS tablet Take 5 mg by mouth daily.    . nebivolol (BYSTOLIC) 5 MG  tablet Take 5 mg by mouth daily at 6 PM.     . Omega-3 Fatty Acids (FISH OIL) 500 MG CAPS Take 500 mg by mouth daily.    Glory Rosebush VERIO test strip     . prednisoLONE acetate (PRED FORTE) 1 % ophthalmic suspension Place 1 drop into both eyes 4 (four) times daily.    . ciprofloxacin (CILOXAN) 0.3 % ophthalmic solution Place 1 drop into the right eye 4 (four) times daily. Administer 1 drop, every 2 hours, while awake, for 2 days. Then 1 drop, every 4 hours, while awake, for the next 5 days.    Marland Kitchen erythromycin ophthalmic ointment Place 1 application into the right eye at bedtime.    . polyethylene glycol powder (GLYCOLAX/MIRALAX) powder Take 17 g by mouth 2 (two) times daily. (Patient not taking: Reported on 01/22/2015) 255 g 0   No current facility-administered medications for this visit.    ROS:   General:  No weight loss, Fever, chills  HEENT: No recent headaches, no nasal bleeding, no visual changes,  no sore throat.  Left eye vision loss secondary to retinal detachment.   Neurologic: No dizziness, blackouts, seizures. No recent symptoms of stroke or mini- stroke. No recent episodes of slurred speech, or temporary blindness. Hx of TIA 2014.  Cardiac: No recent episodes of chest pain/pressure, no shortness of breath at rest.  No shortness of breath with exertion.  Denies history of atrial fibrillation or irregular heartbeat  Vascular: No history of rest pain in feet.  No history of claudication.  No history of non-healing ulcer, No history of DVT   Pulmonary: No home oxygen, no productive cough, no hemoptysis,  No asthma or wheezing  Musculoskeletal:  [ ]  Arthritis, [ ]  Low back pain,  [ ]  Joint pain  Hematologic:No history of hypercoagulable state.  No history of easy bleeding.  No history of anemia  Gastrointestinal: No hematochezia or melena,  No gastroesophageal reflux, no trouble swallowing  Urinary: [x ] chronic Kidney disease, [ ]  on HD - [ ]  MWF or [ ]  TTHS, [ ]  Burning with urination, [ ]  Frequent urination, [ ]  Difficulty urinating;   Skin: No rashes  Psychological: No history of anxiety,  No history of depression   Physical Examination  Filed Vitals:   01/22/15 1413 01/22/15 1426  BP: 190/106 185/102  Pulse: 66   Height: 5\' 9"  (1.753 m)   Weight: 235 lb (106.595 kg)   SpO2: 98%     Body mass index is 34.69 kg/(m^2).  General:  Alert and oriented, no acute distress HEENT: Normal, blindness left eye Neck: No bruit or JVD Pulmonary: Clear to auscultation bilaterally Cardiac: Regular Rate and Rhythm without murmur Gastrointestinal: Soft, non-tender, non-distended, no mass, no scars Skin: No rash Extremity Pulses:  2+ radial, brachial pulses bilaterally Musculoskeletal: No deformity or edema  Neurologic: Upper and lower extremity motor 5/5 and symmetric  DATA:  Vein mapping shows acceptable cephalic veins bilaterally > 0.30 cm diameter Brachial artery  triphasic flow with >0.6 cm diameter  ASSESSMENT:  CKD stage IV   PLAN: We will plan on AV fistula creation Aug 9th by Dr. Scot Dock on the left UE.  He is right hand dominant. Benefits and risks of the surgery were discussed.     Theda Sers, Meris Reede MAUREEN PA-C [facility]  The patient was seen in conjunction with Dr. Scot Dock

## 2015-01-22 NOTE — Telephone Encounter (Signed)
Spoke with Dutchess L. from Dr Mellody Drown office I explained that patient needs precert on up coming echo on 01/27/2015 . She said ok and needed cpt code which i gave to her [phone]

## 2015-01-27 ENCOUNTER — Other Ambulatory Visit (HOSPITAL_COMMUNITY): Payer: 59

## 2015-02-03 ENCOUNTER — Other Ambulatory Visit: Payer: Self-pay

## 2015-02-03 ENCOUNTER — Other Ambulatory Visit (HOSPITAL_COMMUNITY): Payer: 59

## 2015-02-05 MED ORDER — SODIUM CHLORIDE 0.9 % IV SOLN: INTRAVENOUS | Status: DC

## 2015-02-05 MED ORDER — DEXTROSE 5 % IV SOLN: 1.5000 g | INTRAVENOUS | Status: DC

## 2015-02-05 NOTE — Progress Notes (Signed)
Attempted to reach pt by phone for pre-op call. Unable to do so. Called pt's wife and she gave me their son's number who should be with pt. She stated that she thought pt was going to reschedule, but to call son to find out for sure. I called Gregory Hood, pt's son and he states that his father needs to reschedule the surgery. I instructed him to call Dr. Nicole Cella office to to cancel and reschedule. I gave him the number to the office.

## 2015-02-05 NOTE — Progress Notes (Signed)
I called Dr. Nicole Cella office to notify them that pt or pt's family would be calling to reschedule surgery. Spoke with Arbie Cookey.

## 2015-02-05 NOTE — Progress Notes (Signed)
Rec'd phone call from pt's son, DJ.  Reported the pt. requests to cancel his surgery for tomorrow (Creation Left Arm B-C AVF) due to personal reasons.  Stated the pt. will call back when he can reschedule the surgery.

## 2015-02-06 ENCOUNTER — Ambulatory Visit (HOSPITAL_COMMUNITY): Admission: RE | Admit: 2015-02-06 | Payer: 59 | Source: Ambulatory Visit | Admitting: Vascular Surgery

## 2015-02-06 ENCOUNTER — Encounter (HOSPITAL_COMMUNITY): Admission: RE | Payer: Self-pay | Source: Ambulatory Visit

## 2015-02-06 SURGERY — ARTERIOVENOUS (AV) FISTULA CREATION
Anesthesia: Monitor Anesthesia Care | Laterality: Left

## 2015-02-14 ENCOUNTER — Ambulatory Visit (HOSPITAL_COMMUNITY): Payer: 59 | Attending: Cardiology

## 2015-02-14 ENCOUNTER — Other Ambulatory Visit (HOSPITAL_COMMUNITY): Payer: Self-pay | Admitting: Internal Medicine

## 2015-02-14 ENCOUNTER — Other Ambulatory Visit: Payer: Self-pay

## 2015-02-14 DIAGNOSIS — E109 Type 1 diabetes mellitus without complications: Secondary | ICD-10-CM

## 2015-02-14 DIAGNOSIS — I77819 Aortic ectasia, unspecified site: Secondary | ICD-10-CM | POA: Diagnosis not present

## 2015-02-14 DIAGNOSIS — E785 Hyperlipidemia, unspecified: Secondary | ICD-10-CM | POA: Insufficient documentation

## 2015-02-14 DIAGNOSIS — Z87891 Personal history of nicotine dependence: Secondary | ICD-10-CM | POA: Insufficient documentation

## 2015-02-14 DIAGNOSIS — E669 Obesity, unspecified: Secondary | ICD-10-CM | POA: Insufficient documentation

## 2015-02-14 DIAGNOSIS — E119 Type 2 diabetes mellitus without complications: Secondary | ICD-10-CM | POA: Diagnosis not present

## 2015-02-14 DIAGNOSIS — I158 Other secondary hypertension: Secondary | ICD-10-CM

## 2015-02-14 DIAGNOSIS — Z6834 Body mass index (BMI) 34.0-34.9, adult: Secondary | ICD-10-CM | POA: Diagnosis not present

## 2015-02-14 DIAGNOSIS — I10 Essential (primary) hypertension: Secondary | ICD-10-CM | POA: Insufficient documentation

## 2015-02-14 DIAGNOSIS — I340 Nonrheumatic mitral (valve) insufficiency: Secondary | ICD-10-CM | POA: Insufficient documentation

## 2015-02-14 DIAGNOSIS — I313 Pericardial effusion (noninflammatory): Secondary | ICD-10-CM | POA: Insufficient documentation

## 2015-02-14 DIAGNOSIS — I119 Hypertensive heart disease without heart failure: Secondary | ICD-10-CM | POA: Diagnosis present

## 2015-02-20 ENCOUNTER — Other Ambulatory Visit: Payer: Self-pay

## 2015-03-05 ENCOUNTER — Telehealth: Payer: Self-pay | Admitting: *Deleted

## 2015-03-05 ENCOUNTER — Encounter (HOSPITAL_COMMUNITY): Payer: Self-pay

## 2015-03-05 ENCOUNTER — Inpatient Hospital Stay (HOSPITAL_COMMUNITY)
Admission: RE | Admit: 2015-03-05 | Discharge: 2015-03-05 | Disposition: A | Discharge status: Home / Self Care | Payer: 59 | Source: Ambulatory Visit

## 2015-03-05 NOTE — Pre-Procedure Instructions (Signed)
Gregory Hood  03/05/2015      [id number] Gregory Hood, Gregory Hood - [place] [address] [place]  [address] Phone: [phone] Fax: [phone]  Gregory Hood, Gregory Hood [address] [place] [address] [place] [address] Phone: [phone] Fax: [phone]    Your procedure is scheduled on September 8th  Report to [facility] at  9:30 A.M.  Call this number if you have problems the morning of surgery:  [phone]   Remember:  Do not eat food or drink liquids after midnight.  Take these medicines the morning of surgery with A SIP OF WATER Apresoline (hydralazine) eye drops Stop taking Aspirin, BC's, Goody's, herbal medicines, fish oil, ibuprofen, Aleve, garlic   Do not wear jewelry  Do not wear lotions, powders, or perfumes.  You may wear deodorant.  Do not shave 48 hours prior to surgery.  Men may shave face and neck.  Do not bring valuables to the hospital.  [facility] is not responsible for any belongings or valuables.  Contacts, dentures or bridgework may not be worn into surgery.  Leave your suitcase in the car.  After surgery it may be brought to your room.  For patients admitted to the hospital, discharge time will be determined by your treatment team.  Patients discharged the day of surgery will not be allowed to drive home.    Special instructions:  [facility] - Preparing for Surgery  Before surgery, you can play an important role.  Because skin is not sterile, your skin needs to be as free of germs as possible.  You can reduce the number of germs on you skin by washing with CHG (chlorahexidine gluconate) soap before surgery.  CHG is an antiseptic cleaner which kills germs and bonds with the skin to continue killing germs even after washing.  Please DO NOT use if you have an allergy to CHG or antibacterial soaps.  If your skin becomes reddened/irritated stop  using the CHG and inform your nurse when you arrive at Short Stay.  Do not shave (including legs and underarms) for at least 48 hours prior to the first CHG shower.  You may shave your face.  Please follow these instructions carefully:   1.  Shower with CHG Soap the night before surgery and the morning of Surgery.  2.  If you choose to wash your hair, wash your hair first as usual with your normal shampoo.  3.  After you shampoo, rinse your hair and body thoroughly to remove the  Shampoo.  4.  Use CHG as you would any other liquid soap.  You can apply chg directly to the skin and wash gently with scrungie or a clean washcloth.  5.  Apply the CHG Soap to your body ONLY FROM THE NECK DOWN.  Do not use on open wounds or open sores.  Avoid contact with your eyes, ears, mouth and genitals (private parts).  Wash genitals (private parts)  with your normal soap.  6.  Wash thoroughly, paying special attention to the area where your surgery  will be performed.  7.  Thoroughly rinse your body with warm water from the neck down.  8.  DO NOT shower/wash with your normal soap after using and rinsing off the CHG Soap.  9.  Pat yourself dry with a clean towel.  10.  Wear clean pajamas.            11.  Place clean sheets on your bed the night of your first shower and do not  sleep with pets.  Day of Surgery  Do not apply any lotions/deoderants the morning of surgery.  Please wear clean clothes to the hospital/surgery center.    Please read over the following fact sheets that you were given. Pain Booklet, Coughing and Deep Breathing and Surgical Site Infection Prevention   How to Manage Your Diabetes Before Surgery   Why is it important to control my blood sugar before and after surgery?   Improving blood sugar levels before and after surgery helps healing and can limit problems.  A way of improving blood sugar control is eating a healthy diet by:  - Eating less sugar and  carbohydrates  - Increasing activity/exercise  - Talk with your doctor about reaching your blood sugar goals  High blood sugars (greater than 180 mg/dL) can raise your risk of infections and slow down your recovery so you will need to focus on controlling your diabetes during the weeks before surgery.  Make sure that the doctor who takes care of your diabetes knows about your planned surgery including the date and location.  How do I manage my blood sugars before surgery?   Check your blood sugar at least 4 times a day, 2 days before surgery to make sure that they are not too high or low.   Check your blood sugar the morning of your surgery when you wake up and every 2  hours until you get to the Short-Stay unit.  If your blood sugar is less than 70 mg/dL, you will need to treat for low blood sugar by:  Treat a low blood sugar (less than 70 mg/dL) with 1/2 cup of clear juice (cranberry or apple), 4 glucose tablets, OR glucose gel.  Recheck blood sugar in 15 minutes after treatment (to make sure it is greater than 70 mg/dL).  If blood sugar is not greater than 70 mg/dL on re-check, call [phone] for further instructions.   Report your blood sugar to the Short-Stay nurse when you get to Short-Stay.  References:  [facility], 2007 "How to Manage your Diabetes Before and After Surgery".  What do I do about my diabetes medications?   Do not take oral diabetes medicines (pills) the morning of surgery.  THE NIGHT BEFORE SURGERY, take 9  units of Lantus Insulin.        Do not take other diabetes injectables the day of surgery including Byetta, Victoza, Bydureon, and Trulicity.    If your CBG is greater than 220 mg/dL, you may take 1/2 of your sliding scale (correction) dose of insulin.

## 2015-03-05 NOTE — Progress Notes (Signed)
Pt not here for PAT appointment. Interviewed over the phone.  PCP is Dr Jilda Panda Cardiologist is Dr Johnsie Cancel Reports his fasting cbg was 88 this am, and that it normally runs in that range. Reports that he doesn't wear his CPAP. Sleep study was done many years ago.

## 2015-03-05 NOTE — Telephone Encounter (Signed)
Echo from 02/14/15 reviewed. EF 55-60% with no segmental wall motion abnormalities. Ok to proceed with dialysis access.

## 2015-03-05 NOTE — Telephone Encounter (Signed)
ANGELA  FROM ANESTHESIA  CALLED   ASKING FOR  CLEARANCE    FOR  PT   TO HAVE  DIALYSIS ACCESS TOMORROW   DISCUSSED  WITH DOD  DR  Mare Ferrari . WILL  SEND  MESSAGE  TO DR  BRACKBILL   TO  DOCUMENT  THAT  PT  MAY PROCEED WITH PROCEDURE .Adonis Housekeeper

## 2015-03-05 NOTE — Progress Notes (Signed)
Anesthesia Chart Review:  Pt is 64 year old male scheduled for brachiocephalic AV fistula creation on 03/06/15 with Dr. Scot Dock.   Pt is a same day work up.   PMH includes: CVA, TIA, HTN, DM, CKD (stage 4, not on dialysis), COPD, OSA (not using CPAP), hyperlipidemia. Former smoker. BMI 35.   Medications include: ASA, lipitor, lasix, hydralazine, lantus, tradjenta, nebivolol.   Preoperative labs will be obtained DOS.   EKG 03/06/2014: Sinus rhythm. RSR' in V1 or V2, probably normal variant. Nonspecific T abnormalities, lateral leads. Borderline prolonged QT interval.  Echo 02/11/2015:  - Left ventricle: The cavity size was normal. There was moderate concentric hypertrophy. Systolic function was normal. The estimated ejection fraction was in the range of 55% to 60%. Wall motion was normal; there were no regional wall motion abnormalities. Features are consistent with a pseudonormal left ventricular filling pattern, with concomitant abnormal relaxation and increased filling pressure (grade 2 diastolic dysfunction). - Aorta: Ascending aortic diameter: 43 mm (S). - Ascending aorta: The ascending aorta was mildly dilated. - Mitral valve: Calcified annulus. Mildly thickened, mildly calcified leaflets . There was mild regurgitation. - Left atrium: The atrium was moderately dilated. - Right atrium: The atrium was mildly dilated. - Pulmonary arteries: Systolic pressure was moderately increased. PA peak pressure: 54 mm Hg (S). - Pericardium, extracardiac: A small pericardial effusion was identified circumferential to the heart. There was no evidence of hemodynamic compromise.  Nuclear stress test 06/24/2014:  -Intermediate risk stress nuclear study There is evidence of mild ischemia in the mid-basal inferolateral wall. The EF has decreased since previous myoview. LV Ejection Fraction: 42%. LV Wall Motion: moderate hypokinesis of the inferior wall.   Pt does not routinely see cardiology, but was seen by  Dr. Johnsie Cancel last fall for a pre-op evaluation for dialysis access.  Stress test results as above. Dr. Johnsie Cancel comments on result that he won't cath pt since creatinine is elevated.   Discussed case with Dr. Conrad Grandview. He asked that Dr. Johnsie Cancel officially weigh in on whether or not pt is ok to proceed with tomorrow's AV fistula procedure. Spoke with Devra Dopp, LPN who spoke with Dr. Mare Ferrari. Per Altha Harm, Dr. Mare Ferrari feels pt is ok to proceed as scheduled.   If labs acceptable DOS and no changes, I anticipate pt can proceed as scheduled.   Willeen Cass, FNP-BC [facility] Short Stay Surgical Center/Anesthesiology Phone: [phone] 03/05/2015 3:40 PM

## 2015-03-06 ENCOUNTER — Ambulatory Visit (HOSPITAL_COMMUNITY): Payer: 59 | Admitting: Anesthesiology

## 2015-03-06 ENCOUNTER — Encounter (HOSPITAL_COMMUNITY): Payer: Self-pay | Admitting: *Deleted

## 2015-03-06 ENCOUNTER — Encounter (HOSPITAL_COMMUNITY)
Admission: RE | Disposition: A | Discharge status: Home / Self Care | Payer: Self-pay | Source: Ambulatory Visit | Attending: Vascular Surgery

## 2015-03-06 ENCOUNTER — Ambulatory Visit (HOSPITAL_COMMUNITY): Payer: 59 | Admitting: Emergency Medicine

## 2015-03-06 ENCOUNTER — Ambulatory Visit (HOSPITAL_COMMUNITY)
Admission: RE | Admit: 2015-03-06 | Discharge: 2015-03-06 | Disposition: A | Discharge status: Home / Self Care | Payer: 59 | Source: Ambulatory Visit | Attending: Vascular Surgery | Admitting: Vascular Surgery

## 2015-03-06 ENCOUNTER — Encounter (HOSPITAL_COMMUNITY): Payer: Self-pay | Admitting: Emergency Medicine

## 2015-03-06 ENCOUNTER — Emergency Department (HOSPITAL_COMMUNITY)
Admission: EM | Admit: 2015-03-06 | Discharge: 2015-03-07 | Disposition: A | Discharge status: Home / Self Care | Payer: 59 | Attending: Emergency Medicine | Admitting: Emergency Medicine

## 2015-03-06 DIAGNOSIS — Z794 Long term (current) use of insulin: Secondary | ICD-10-CM | POA: Insufficient documentation

## 2015-03-06 DIAGNOSIS — Z87891 Personal history of nicotine dependence: Secondary | ICD-10-CM | POA: Diagnosis not present

## 2015-03-06 DIAGNOSIS — Z8673 Personal history of transient ischemic attack (TIA), and cerebral infarction without residual deficits: Secondary | ICD-10-CM | POA: Insufficient documentation

## 2015-03-06 DIAGNOSIS — G4733 Obstructive sleep apnea (adult) (pediatric): Secondary | ICD-10-CM | POA: Insufficient documentation

## 2015-03-06 DIAGNOSIS — E1122 Type 2 diabetes mellitus with diabetic chronic kidney disease: Secondary | ICD-10-CM | POA: Insufficient documentation

## 2015-03-06 DIAGNOSIS — I129 Hypertensive chronic kidney disease with stage 1 through stage 4 chronic kidney disease, or unspecified chronic kidney disease: Secondary | ICD-10-CM | POA: Diagnosis not present

## 2015-03-06 DIAGNOSIS — E780 Pure hypercholesterolemia: Secondary | ICD-10-CM | POA: Insufficient documentation

## 2015-03-06 DIAGNOSIS — Z7982 Long term (current) use of aspirin: Secondary | ICD-10-CM | POA: Insufficient documentation

## 2015-03-06 DIAGNOSIS — N184 Chronic kidney disease, stage 4 (severe): Secondary | ICD-10-CM | POA: Insufficient documentation

## 2015-03-06 DIAGNOSIS — Z7952 Long term (current) use of systemic steroids: Secondary | ICD-10-CM | POA: Diagnosis not present

## 2015-03-06 DIAGNOSIS — E11319 Type 2 diabetes mellitus with unspecified diabetic retinopathy without macular edema: Secondary | ICD-10-CM | POA: Diagnosis not present

## 2015-03-06 DIAGNOSIS — R569 Unspecified convulsions: Secondary | ICD-10-CM | POA: Diagnosis not present

## 2015-03-06 DIAGNOSIS — J449 Chronic obstructive pulmonary disease, unspecified: Secondary | ICD-10-CM | POA: Diagnosis not present

## 2015-03-06 DIAGNOSIS — Z79891 Long term (current) use of opiate analgesic: Secondary | ICD-10-CM | POA: Diagnosis not present

## 2015-03-06 HISTORY — PX: BASCILIC VEIN TRANSPOSITION: SHX5742

## 2015-03-06 HISTORY — DX: Orthostatic hypotension: I95.1

## 2015-03-06 LAB — CBC WITH DIFFERENTIAL/PLATELET
BASOS ABS: 0 10*3/uL (ref 0.0–0.1)
BASOS PCT: 0 % (ref 0–1)
EOS ABS: 0.1 10*3/uL (ref 0.0–0.7)
EOS PCT: 1 % (ref 0–5)
HCT: 38.2 % — ABNORMAL LOW (ref 39.0–52.0)
Hemoglobin: 12.2 g/dL — ABNORMAL LOW (ref 13.0–17.0)
Lymphocytes Relative: 11 % — ABNORMAL LOW (ref 12–46)
Lymphs Abs: 1.2 10*3/uL (ref 0.7–4.0)
MCH: 27.5 pg (ref 26.0–34.0)
MCHC: 31.9 g/dL (ref 30.0–36.0)
MCV: 86 fL (ref 78.0–100.0)
MONO ABS: 0.7 10*3/uL (ref 0.1–1.0)
Monocytes Relative: 7 % (ref 3–12)
Neutro Abs: 8.2 10*3/uL — ABNORMAL HIGH (ref 1.7–7.7)
Neutrophils Relative %: 81 % — ABNORMAL HIGH (ref 43–77)
PLATELETS: 196 10*3/uL (ref 150–400)
RBC: 4.44 MIL/uL (ref 4.22–5.81)
RDW: 12.9 % (ref 11.5–15.5)
WBC: 10.2 10*3/uL (ref 4.0–10.5)

## 2015-03-06 LAB — POCT I-STAT 4, (NA,K, GLUC, HGB,HCT)
GLUCOSE: 121 mg/dL — AB (ref 65–99)
HCT: 40 % (ref 39.0–52.0)
Hemoglobin: 13.6 g/dL (ref 13.0–17.0)
POTASSIUM: 5.2 mmol/L — AB (ref 3.5–5.1)
SODIUM: 136 mmol/L (ref 135–145)

## 2015-03-06 LAB — GLUCOSE, CAPILLARY: GLUCOSE-CAPILLARY: 97 mg/dL (ref 65–99)

## 2015-03-06 LAB — CBG MONITORING, ED: GLUCOSE-CAPILLARY: 137 mg/dL — AB (ref 65–99)

## 2015-03-06 SURGERY — TRANSPOSITION, VEIN, BASILIC
Anesthesia: Monitor Anesthesia Care | Site: Arm Upper | Laterality: Left

## 2015-03-06 MED ORDER — PROPOFOL 10 MG/ML IV BOLUS
INTRAVENOUS | Status: AC
  Filled 2015-03-06: qty 20

## 2015-03-06 MED ORDER — HEPARIN SODIUM (PORCINE) 1000 UNIT/ML IJ SOLN
INTRAMUSCULAR | Status: DC | PRN
Start: 2015-03-06 — End: 2015-03-06
  Administered 2015-03-06: 8000 [IU] via INTRAVENOUS

## 2015-03-06 MED ORDER — MIDAZOLAM HCL 2 MG/2ML IJ SOLN
INTRAMUSCULAR | Status: AC
  Filled 2015-03-06: qty 4

## 2015-03-06 MED ORDER — PROTAMINE SULFATE 10 MG/ML IV SOLN
INTRAVENOUS | Status: AC
  Filled 2015-03-06: qty 5

## 2015-03-06 MED ORDER — ONDANSETRON HCL 4 MG/2ML IJ SOLN
INTRAMUSCULAR | Status: AC
  Filled 2015-03-06: qty 2

## 2015-03-06 MED ORDER — SODIUM CHLORIDE 0.9 % IR SOLN
Status: DC | PRN
  Administered 2015-03-06: 500 mL

## 2015-03-06 MED ORDER — 0.9 % SODIUM CHLORIDE (POUR BTL) OPTIME
TOPICAL | Status: DC | PRN
  Administered 2015-03-06: 1000 mL

## 2015-03-06 MED ORDER — HEPARIN SODIUM (PORCINE) 1000 UNIT/ML IJ SOLN
INTRAMUSCULAR | Status: AC
  Filled 2015-03-06: qty 1

## 2015-03-06 MED ORDER — LIDOCAINE HCL (CARDIAC) 20 MG/ML IV SOLN
INTRAVENOUS | Status: AC
  Filled 2015-03-06: qty 5

## 2015-03-06 MED ORDER — SODIUM CHLORIDE 0.9 % IV SOLN
INTRAVENOUS | Status: DC
  Administered 2015-03-06: 10:00:00 via INTRAVENOUS

## 2015-03-06 MED ORDER — SODIUM CHLORIDE 0.9 % IV SOLN
10.0000 mg | INTRAVENOUS | Status: DC | PRN
  Administered 2015-03-06: 10 ug/min via INTRAVENOUS

## 2015-03-06 MED ORDER — DEXTROSE 5 % IV SOLN
INTRAVENOUS | Status: AC
  Filled 2015-03-06: qty 1.5

## 2015-03-06 MED ORDER — PHENYLEPHRINE HCL 10 MG/ML IJ SOLN
INTRAMUSCULAR | Status: DC | PRN
  Administered 2015-03-06 (×2): 80 ug via INTRAVENOUS
  Administered 2015-03-06: 20 ug via INTRAVENOUS

## 2015-03-06 MED ORDER — ONDANSETRON HCL 4 MG/2ML IJ SOLN
INTRAMUSCULAR | Status: DC | PRN
  Administered 2015-03-06: 4 mg via INTRAVENOUS

## 2015-03-06 MED ORDER — LIDOCAINE HCL (CARDIAC) 20 MG/ML IV SOLN
INTRAVENOUS | Status: DC | PRN
  Administered 2015-03-06: 60 mg via INTRAVENOUS

## 2015-03-06 MED ORDER — PROTAMINE SULFATE 10 MG/ML IV SOLN
INTRAVENOUS | Status: DC | PRN
  Administered 2015-03-06 (×4): 10 mg via INTRAVENOUS

## 2015-03-06 MED ORDER — LIDOCAINE HCL (PF) 1 % IJ SOLN
INTRAMUSCULAR | Status: AC
  Filled 2015-03-06: qty 30

## 2015-03-06 MED ORDER — MIDAZOLAM HCL 5 MG/5ML IJ SOLN
INTRAMUSCULAR | Status: DC | PRN
  Administered 2015-03-06: 2 mg via INTRAVENOUS

## 2015-03-06 MED ORDER — THROMBIN 20000 UNITS EX SOLR
CUTANEOUS | Status: AC
  Filled 2015-03-06: qty 20000

## 2015-03-06 MED ORDER — LIDOCAINE HCL (PF) 1 % IJ SOLN
INTRAMUSCULAR | Status: DC | PRN
  Administered 2015-03-06: 22 mL via INTRADERMAL
  Administered 2015-03-06: 30 mL via INTRADERMAL

## 2015-03-06 MED ORDER — FENTANYL CITRATE (PF) 250 MCG/5ML IJ SOLN
INTRAMUSCULAR | Status: AC
Start: 2015-03-06 — End: 2015-03-06
  Filled 2015-03-06: qty 5

## 2015-03-06 MED ORDER — CHLORHEXIDINE GLUCONATE CLOTH 2 % EX PADS: 6.0000 | MEDICATED_PAD | Freq: Once | CUTANEOUS | Status: DC

## 2015-03-06 MED ORDER — OXYCODONE-ACETAMINOPHEN 5-325 MG PO TABS: 1.0000 | ORAL_TABLET | ORAL | Status: DC | PRN

## 2015-03-06 MED ORDER — FENTANYL CITRATE (PF) 250 MCG/5ML IJ SOLN
INTRAMUSCULAR | Status: DC | PRN
  Administered 2015-03-06: 50 ug via INTRAVENOUS

## 2015-03-06 MED ORDER — PROPOFOL INFUSION 10 MG/ML OPTIME
INTRAVENOUS | Status: DC | PRN
  Administered 2015-03-06: 75 ug/kg/min via INTRAVENOUS

## 2015-03-06 MED ORDER — PAPAVERINE HCL 30 MG/ML IJ SOLN
INTRAMUSCULAR | Status: AC
  Filled 2015-03-06: qty 2

## 2015-03-06 MED ORDER — DEXTROSE 5 % IV SOLN
1.5000 g | INTRAVENOUS | Status: DC | PRN
  Administered 2015-03-06: 1.5 g via INTRAVENOUS

## 2015-03-06 SURGICAL SUPPLY — 40 items
ARMBAND PINK RESTRICT EXTREMIT (MISCELLANEOUS) ×4 IMPLANT
CANISTER SUCTION 2500CC (MISCELLANEOUS) ×4 IMPLANT
CANNULA VESSEL 3MM 2 BLNT TIP (CANNULA) ×4 IMPLANT
CLIP TI MEDIUM 6 (CLIP) ×4 IMPLANT
CLIP TI WIDE RED SMALL 24 (CLIP) ×6 IMPLANT
CLIP TI WIDE RED SMALL 6 (CLIP) ×4 IMPLANT
COVER PROBE W GEL 5X96 (DRAPES) ×3 IMPLANT
DECANTER SPIKE VIAL GLASS SM (MISCELLANEOUS) ×4 IMPLANT
ELECT REM PT RETURN 9FT ADLT (ELECTROSURGICAL) ×4
ELECTRODE REM PT RTRN 9FT ADLT (ELECTROSURGICAL) ×2 IMPLANT
GLOVE BIO SURGEON STRL SZ7.5 (GLOVE) ×4 IMPLANT
GLOVE BIOGEL PI IND STRL 6.5 (GLOVE) ×4 IMPLANT
GLOVE BIOGEL PI IND STRL 7.5 (GLOVE) ×1 IMPLANT
GLOVE BIOGEL PI IND STRL 8 (GLOVE) ×2 IMPLANT
GLOVE BIOGEL PI INDICATOR 6.5 (GLOVE) ×8
GLOVE BIOGEL PI INDICATOR 7.5 (GLOVE) ×2
GLOVE BIOGEL PI INDICATOR 8 (GLOVE) ×2
GLOVE ECLIPSE 6.5 STRL STRAW (GLOVE) ×3 IMPLANT
GLOVE ECLIPSE 7.0 STRL STRAW (GLOVE) ×3 IMPLANT
GLOVE SURG SS PI 6.5 STRL IVOR (GLOVE) ×9 IMPLANT
GOWN STRL REUS W/ TWL LRG LVL3 (GOWN DISPOSABLE) ×8 IMPLANT
GOWN STRL REUS W/TWL LRG LVL3 (GOWN DISPOSABLE) ×20
KIT BASIN OR (CUSTOM PROCEDURE TRAY) ×4 IMPLANT
KIT ROOM TURNOVER OR (KITS) ×4 IMPLANT
LIQUID BAND (GAUZE/BANDAGES/DRESSINGS) ×4 IMPLANT
LOOP VESSEL MINI RED (MISCELLANEOUS) ×3 IMPLANT
NS IRRIG 1000ML POUR BTL (IV SOLUTION) ×4 IMPLANT
PACK CV ACCESS (CUSTOM PROCEDURE TRAY) ×4 IMPLANT
PAD ARMBOARD 7.5X6 YLW CONV (MISCELLANEOUS) ×8 IMPLANT
SPONGE SURGIFOAM ABS GEL 100 (HEMOSTASIS) IMPLANT
SUT PROLENE 6 0 BV (SUTURE) ×4 IMPLANT
SUT SILK 2 0 SH (SUTURE) ×3 IMPLANT
SUT SILK 3 0 (SUTURE) ×4
SUT SILK 3-0 18XBRD TIE 12 (SUTURE) ×1 IMPLANT
SUT VIC AB 3-0 SH 27 (SUTURE) ×8
SUT VIC AB 3-0 SH 27X BRD (SUTURE) ×3 IMPLANT
SUT VICRYL 4-0 PS2 18IN ABS (SUTURE) ×7 IMPLANT
TOWEL OR 17X24 6PK STRL BLUE (TOWEL DISPOSABLE) ×3 IMPLANT
UNDERPAD 30X30 INCONTINENT (UNDERPADS AND DIAPERS) ×4 IMPLANT
WATER STERILE IRR 1000ML POUR (IV SOLUTION) ×4 IMPLANT

## 2015-03-06 NOTE — Anesthesia Postprocedure Evaluation (Signed)
  Anesthesia Post-op Note  Patient: Gregory Hood  Procedure(s) Performed: Procedure(s): LET BASCILIC VEIN TRANSPOSITION (Left)  Patient Location: PACU  Anesthesia Type:MAC  Level of Consciousness: awake and alert   Airway and Oxygen Therapy: Patient Spontanous Breathing  Post-op Pain: none  Post-op Assessment: Post-op Vital signs reviewed              Post-op Vital Signs: Reviewed  Last Vitals:  Filed Vitals:   03/06/15 1436  BP:   Pulse: 58  Temp: 36.7 C  Resp: 16    Complications: No apparent anesthesia complications

## 2015-03-06 NOTE — ED Notes (Signed)
Patient here with complaint of seizure like activity. Episode was witnessed by family. Patient underwent procedure today to place dialysis access in left upper arm and was discharged home afterward. Family describes incident where patient's eyes became "real big, like dey was gonna pop out his head". Patient then began to have some upper body tremors and shortly after began to vomit. Patient doesn't remember the initial symptoms, but recalls when he started to vomit. Currently patient has no complaints and feels fine. EMS came to home, but patient refused transport.

## 2015-03-06 NOTE — ED Provider Notes (Signed)
CSN: [id number]     Arrival date & time 03/06/15  1812 History   First MD Initiated Contact with Patient 03/06/15 2206     Chief Complaint  Patient presents with  . Seizures     (Consider location/radiation/quality/duration/timing/severity/associated sxs/prior Treatment) HPI Comments: 64 year old male with a history of hypertension, diabetes mellitus, TIA versus stroke, dyslipidemia, obesity, COPD, and orthostatic hypotension presents to the emergency department for further evaluation of seizure-like activity. Patient reports that he had a left basilic vein transposition done this morning by Dr. Scot Dock. He had last ate or drank at 1800 yesterday. Following his procedure, patient got into the car and began to feel hot. Symptoms began at 1730. Patient reports telling his son that he needed something to drink. He felt a sensation of nausea. Wife also reports the patient c/o of b/l feet itching. Patient then had an approximately 1 minute episode where his eyes went wide into a gaze and his pupils enlarged. Patient began "foaming at the mouth" and experienced 1 episode of emesis when his gaze ceased. Wife reports similar episodes in the past, all similar to today, usually preceded by a sensation of itching in his feet. No known hx of seizure disorder in the patient. Patient denies headache, CP, SOB, numbness, or extremity weakness.  Patient with his first episode of same in August 2015. He was admitted for work up of seizure vs presyncope. He had a head CT shortly after which showed no acute changes. Wife states that patient has had 5 prior episodes, all c/w tonight's presentation. He has not followed up with his neurologist for further evaluation of these episodes.  The history is provided by the patient. No language interpreter was used.    Past Medical History  Diagnosis Date  . Hypertension   . Diabetes mellitus without complication   . TIA (transient ischemic attack)   . Stroke   . Chronic  kidney disease   . Hyperlipidemia   . Obesity   . OSA on CPAP   . Retinal detachment   . Diabetic retinopathy   . Umbilical hernia   . Seizures   . COPD (chronic obstructive pulmonary disease)   . Orthostatic hypotension    Past Surgical History  Procedure Laterality Date  . Septoplasty  20 years ago  . Hammer toe surgery Left 30 years ago    small toe left toe  . Circumcision    . Eye surgery Bilateral     had surgery several different times.   Family History  Problem Relation Age of Onset  . Hypertension Mother   . Diabetes type II Mother   . Transient ischemic attack Mother   . Kidney disease Mother   . Diabetes Mother   . Varicose Veins Mother   . Hypertension Father   . Diabetes type II Father   . Diabetes Father   . Diabetes type II Sister   . Hypertension Sister   . Diabetes Sister   . Learning disabilities Maternal Uncle    Social History  Substance Use Topics  . Smoking status: Former Smoker    Types: Cigarettes    Quit date: 03/05/1974  . Smokeless tobacco: Never Used  . Alcohol Use: No    Review of Systems  Constitutional: Negative for fever.  Respiratory: Negative for shortness of breath.   Cardiovascular: Negative for chest pain.  Gastrointestinal: Positive for vomiting. Negative for abdominal pain.  Musculoskeletal: Negative for myalgias and arthralgias.  Skin: Negative for color change.  Neurological:  Positive for seizures.  All other systems reviewed and are negative.   Allergies  Lexiscan and Ace inhibitors  Home Medications   Prior to Admission medications   Medication Sig Start Date End Date Taking? Authorizing Provider  Ascorbic Acid (VITAMIN C) 1000 MG tablet Take 1,000 mg by mouth daily.   Yes Historical Provider, MD  aspirin 81 MG tablet Take 162 mg by mouth daily.   Yes Historical Provider, MD  atorvastatin (LIPITOR) 40 MG tablet Take 40 mg by mouth daily.   Yes Historical Provider, MD  calcitRIOL (ROCALTROL) 0.25 MCG capsule  Take 0.25 mcg by mouth daily.   Yes Historical Provider, MD  Cholecalciferol (VITAMIN D3) 5000 UNITS TABS Take 5,000 Units by mouth daily.   Yes Historical Provider, MD  folic acid (FOLVITE) 1 MG tablet Take 2 tablets (2 mg total) by mouth daily. 04/03/14  Yes Josue Hector, MD  furosemide (LASIX) 80 MG tablet Take 80 mg by mouth daily.  01/25/14  Yes Historical Provider, MD  Garlic 123XX123 MG CAPS Take 1,000 mg by mouth daily.   Yes Historical Provider, MD  hydrALAZINE (APRESOLINE) 50 MG tablet Take 50 mg by mouth every 8 (eight) hours.    Yes Historical Provider, MD  insulin glargine (LANTUS) 100 UNIT/ML injection Inject 12 Units into the skin at bedtime.    Yes Historical Provider, MD  linagliptin (TRADJENTA) 5 MG TABS tablet Take 5 mg by mouth daily as needed.    Yes Historical Provider, MD  nebivolol (BYSTOLIC) 5 MG tablet Take 10 mg by mouth daily at 6 PM.    Yes Historical Provider, MD  Omega-3 Fatty Acids (FISH OIL) 500 MG CAPS Take 500 mg by mouth daily.   Yes Historical Provider, MD  OVER THE COUNTER MEDICATION Take 1 capsule by mouth daily. Renafood   Yes Historical Provider, MD  prednisoLONE acetate (PRED FORTE) 1 % ophthalmic suspension Place 1 drop into the left eye 2 (two) times daily.    Yes Historical Provider, MD  [facility] VERIO test strip  01/08/14   Historical Provider, MD  oxyCODONE-acetaminophen (ROXICET) 5-325 MG per tablet Take 1-2 tablets by mouth every 4 (four) hours as needed for severe pain. 03/06/15   Angelia Mould, MD   BP 135/66 mmHg  Pulse 73  Temp(Src) 99.1 F (37.3 C) (Oral)  Resp 11  SpO2 97%   Physical Exam  Constitutional: He is oriented to person, place, and time. He appears well-developed and well-nourished. No distress.  Nontoxic/nonseptic appearing  HENT:  Head: Normocephalic and atraumatic.  Mouth/Throat: Oropharynx is clear and moist. No oropharyngeal exudate.  Symmetric rise of the uvula with phonation.  Eyes: EOM are normal. No scleral  icterus.  Round reactive pupil on the right. Patient legally blind in L eye.  Neck: Normal range of motion.  Cardiovascular: Normal rate, regular rhythm and intact distal pulses.   Pulmonary/Chest: Effort normal. No respiratory distress. He has no wheezes. He has no rales.  Respirations even and unlabored.  Musculoskeletal: Normal range of motion.  Surgical site to LUE is C/D/I  Neurological: He is alert and oriented to person, place, and time. No cranial nerve deficit. He exhibits normal muscle tone. Coordination normal.  GCS 15. Speech is goal oriented. No focal neurologic deficits appreciated. Patient with equal grip strength b/l with 5/5 strength against resistance in all major muscle groups b/l. Normal sensation in all extremities. No ataxia noted.  Skin: Skin is warm and dry. No rash noted. He is not  diaphoretic. No erythema. No pallor.  Psychiatric: He has a normal mood and affect. His behavior is normal.  Nursing note and vitals reviewed.   ED Course  Procedures (including critical care time) Labs Review Labs Reviewed  CBC WITH DIFFERENTIAL/PLATELET - Abnormal; Notable for the following:    Hemoglobin 12.2 (*)    HCT 38.2 (*)    Neutrophils Relative % 81 (*)    Neutro Abs 8.2 (*)    Lymphocytes Relative 11 (*)    All other components within normal limits  COMPREHENSIVE METABOLIC PANEL - Abnormal; Notable for the following:    Sodium 133 (*)    Chloride 99 (*)    Glucose, Bld 149 (*)    BUN 48 (*)    Creatinine, Ser 4.31 (*)    Calcium 8.4 (*)    Total Protein 6.4 (*)    Albumin 3.3 (*)    GFR calc non Af Amer 13 (*)    GFR calc Af Amer 16 (*)    All other components within normal limits  CBG MONITORING, ED - Abnormal; Notable for the following:    Glucose-Capillary 137 (*)    All other components within normal limits    Imaging Review No results found.   Ct Head Wo Contrast  02/22/2014 CLINICAL DATA: Fatigue, weakness, elevated blood pressure, syncope EXAM:  CT HEAD WITHOUT CONTRAST TECHNIQUE: Contiguous axial images were obtained from the base of the skull through the vertex without intravenous contrast. COMPARISON: 03/08/2010 FINDINGS: Severe diffuse cortical atrophy. Severe low attenuation in the deep white matter. The severity of deep white matter attenuation change has increased when compared to prior study, as has the degree of atrophy. There is no evidence of acute vascular territory infarct. There is no hemorrhage or extra-axial fluid. No hydrocephalus. The left maxillary sinus is completely opacified, and is also mildly expanded. Mild mucosal thickening right maxillary sinus. Sphenoid, ethmoid, and frontal sinuses are clear. Calvarium is intact. IMPRESSION: Complete opacification and expansion of left maxillary sinus suggesting severe chronic sinusitis. Severe chronic ischemic change, progressive when compared to 2011. Electronically Signed By: Skipper Cliche M.D. On: 02/22/2014 12:32   I have personally reviewed and evaluated these images and lab results as part of my medical decision-making.   EKG Interpretation None      MDM   Final diagnoses:  Seizure-like activity    64 year old male presents to the emergency department for evaluation of seizure-like activity. Onset today was at 1730. Symptoms lasted <1 minute. Patient has had similar symptoms on 5 prior occasions over the past year. He states that they happen in situations where he feels overheated, usually preceded by a sensation of pruritus in b/l feet. He was admitted for further workup after his first episode in August 2015. He has yet to follow-up with a neurologist for further evaluation of his seizure-like activity.  Patient with stable vital signs today. He is afebrile. Neurologic exam nonfocal. CT had from initial workup reviewed which showed no acute findings at this time. No gross electrolyte abnormalities today. No leukocytosis or worsening anemia from baseline.  Patient states that he is feeling well and he desires discharge. Given chronicity of his symptoms, I do not believe this is unreasonable. Patient to follow-up with [facility] neurology for further evaluation of his seizure-like episodes. Return precautions given at discharge. Patient discharged in good condition with no unaddressed concerns.   Filed Vitals:   03/06/15 2345 03/07/15 0000 03/07/15 0015 03/07/15 0030  BP: 149/69 146/74 142/73 135/66  Pulse: 68 68 68 73  Temp:      TempSrc:      Resp: 16 17 12 11   SpO2: 97% 95% 99% 97%     Antonietta Breach, PA-C 03/07/15 0101  Orlie Dakin, MD 03/07/15 [id number]

## 2015-03-06 NOTE — Op Note (Signed)
    NAME: Gregory Hood    MRN: [id number] DOB: 09/18/1950    DATE OF OPERATION: 03/06/2015  PREOP DIAGNOSIS: stage IV chronic kidney disease  POSTOP DIAGNOSIS: same  PROCEDURE: left basilic vein transposition  SURGEON: Judeth Cornfield. Scot Dock, MD, FACS  ASSIST: Gerri Lins PA  ANESTHESIA: local with sedation   EBL: minimal  INDICATIONS: Gregory Hood is a 64 y.o. male who is not yet on dialysis. He presents for new access.  FINDINGS: 4 mm basilic vein.  TECHNIQUE: The patient was taken to the operating room. I could not identify the upper arm cephalic vein with the Doppler and therefore elected to do a basilic vein transposition. After the skin was anesthetized with 1% lidocaine an incision was made over the basilic vein just above the antecubital level. Here the vein was dissected free. A separate incision was made distal to this after the skin was anesthetized. The vein divided into 3 branches and the largest branch was selected. The vein was dissected free circumferentially with branches divided between clips and 3-0 silk ties. An additional incision was made further proximally after the skin was anesthetized and the vein was dissected free up to just below the axilla. Through the distal incision the brachial artery was dissected free. The vein was ligated distally and irrigated out with heparinized saline. It was then marked to prevent twisting. After the skin was anesthetized tunnel was created from the incision to the brachial artery to the axillary incision. The vein was tunneled between the 2 incisions and the patient was heparinized. The brachial artery was clamped proximally and distally and longitudinal arteriotomy was made. The vein was sewn end-to-side to the artery using continuous 60 proline suture. At the completion was excellent thrill in the fistula and a palpable radial pulse. The heparin was partially reversed with protamine. Each of the wounds was closed the deep layer of  3-0 Vicryl and the skin closed with 4-0 Vicryl. Liqui Band was applied. The patient tolerated the procedure well and was transferred to the recovery room in stable condition. All needle and sponge counts were correct.  Deitra Mayo, MD, FACS [facility]  DATE OF DICTATION:   03/06/2015

## 2015-03-06 NOTE — Anesthesia Preprocedure Evaluation (Addendum)
Anesthesia Evaluation  Patient identified by MRN, date of birth, ID band Patient awake    Reviewed: Allergy & Precautions, NPO status , Patient's Chart, lab work & pertinent test results  Airway Mallampati: II  TM Distance: >3 FB Neck ROM: Full    Dental   Pulmonary sleep apnea , COPD, former smoker,    breath sounds clear to auscultation       Cardiovascular hypertension, Pt. on medications and Pt. on home beta blockers  Rhythm:Regular Rate:Normal     Neuro/Psych Seizures -,  TIACVA    GI/Hepatic negative GI ROS, Neg liver ROS,   Endo/Other  diabetes, Type 2, Insulin DependentMorbid obesity  Renal/GU CRFRenal disease     Musculoskeletal   Abdominal   Peds  Hematology negative hematology ROS (+)   Anesthesia Other Findings   Reproductive/Obstetrics                            Anesthesia Physical Anesthesia Plan  ASA: III  Anesthesia Plan: MAC   Post-op Pain Management:    Induction: Intravenous  Airway Management Planned: Natural Airway and Simple Face Mask  Additional Equipment:   Intra-op Plan:   Post-operative Plan:   Informed Consent: I have reviewed the patients History and Physical, chart, labs and discussed the procedure including the risks, benefits and alternatives for the proposed anesthesia with the patient or authorized representative who has indicated his/her understanding and acceptance.   Dental advisory given  Plan Discussed with: CRNA  Anesthesia Plan Comments:         Anesthesia Quick Evaluation

## 2015-03-06 NOTE — H&P (Signed)
HISTORY AND PHYSICAL   History of Present Illness: Patient is a 64 y.o. year old male who presents for placement of a permanent hemodialysis access. The patient is right handed. He has a Cr of 3.67 and GFR of 19. He is in stage IV CKD.  The patient is not currently on hemodialysis. The patient has never had a PPM placed. Past medical history includes: hypertension managed with Hydralizine and Nebivolol, hypercholesterolemia managed with Lipitor, and DM managed with Insulin. He does have a history of TIA in 2014 fully recovered. He is currently not on anticoagulation medication. He does take a daily 81 mg Aspirin.   Past Medical History  Diagnosis Date  . Hypertension   . Diabetes mellitus without complication   . TIA (transient ischemic attack)   . Stroke   . Chronic kidney disease   . Hyperlipidemia   . Obesity   . OSA on CPAP   . Retinal detachment   . Diabetic retinopathy   . Umbilical hernia   . Seizures   . COPD (chronic obstructive pulmonary disease)     History reviewed. No pertinent past surgical history.   Social History History  Substance Use Topics  . Smoking status: Former Smoker    Types: Cigarettes    Quit date: 03/05/1974  . Smokeless tobacco: Never Used  . Alcohol Use: No    Family History Family History  Problem Relation Age of Onset  . Hypertension Mother   . Diabetes type II Mother   . Transient ischemic attack Mother   . Kidney disease Mother   . Diabetes Mother   . Varicose Veins Mother   . Hypertension Father   . Diabetes type II Father   . Diabetes Father   . Diabetes type II Sister   . Hypertension Sister   . Diabetes Sister   . Learning disabilities Maternal Uncle     Allergies  Allergies  Allergen Reactions  . Lexiscan [Regadenoson] Other (See Comments)    Seizure like-activity after  lexiscan given.  . Ace Inhibitors Cough     Current Outpatient Prescriptions  Medication Sig Dispense Refill  . Ascorbic Acid (VITAMIN C PO) Take 1 tablet by mouth daily.    Marland Kitchen aspirin 81 MG tablet Take 162 mg by mouth daily.    Marland Kitchen atorvastatin (LIPITOR) 40 MG tablet Take 40 mg by mouth daily.    Marland Kitchen atropine 1 % ophthalmic solution Place 1 drop into the right eye 2 (two) times daily.    . calcitRIOL (ROCALTROL) 0.25 MCG capsule Take 0.25 mcg by mouth daily.    . Cholecalciferol (VITAMIN D PO) Take 1 tablet by mouth daily.    . folic acid (FOLVITE) 1 MG tablet Take 2 tablets (2 mg total) by mouth daily. 180 tablet 3  . furosemide (LASIX) 80 MG tablet Take 80 mg by mouth daily.     . Garlic 123XX123 MG CAPS Take 1,000 mg by mouth daily.    . hydrALAZINE (APRESOLINE) 50 MG tablet Take 50 mg by mouth every 8 (eight) hours.     . insulin glargine (LANTUS) 100 UNIT/ML injection Inject 12 Units into the skin at bedtime.     . isosorbide mononitrate (IMDUR) 30 MG 24 hr tablet Take 1 tablet (30 mg total) by mouth daily. 90 tablet 3  . linagliptin (TRADJENTA) 5 MG TABS tablet Take 5 mg by mouth daily.    . nebivolol (BYSTOLIC) 5 MG tablet Take 5 mg by mouth daily at 6  PM.     . Omega-3 Fatty Acids (FISH OIL) 500 MG CAPS Take 500 mg by mouth daily.    Glory Rosebush VERIO test strip     . prednisoLONE acetate (PRED FORTE) 1 % ophthalmic suspension Place 1 drop into both eyes 4 (four) times daily.    . ciprofloxacin (CILOXAN) 0.3 % ophthalmic solution Place 1 drop into the right eye 4 (four) times daily. Administer 1 drop, every 2 hours, while awake, for 2 days. Then 1 drop, every 4 hours, while awake, for the next 5 days.    Marland Kitchen erythromycin ophthalmic ointment Place 1 application into the right eye at bedtime.    . polyethylene glycol powder (GLYCOLAX/MIRALAX) powder Take 17 g by mouth 2 (two) times daily.  (Patient not taking: Reported on 01/22/2015) 255 g 0   No current facility-administered medications for this visit.    ROS:   General: No weight loss, Fever, chills  HEENT: No recent headaches, no nasal bleeding, no visual changes, no sore throat. Left eye vision loss secondary to retinal detachment.   Neurologic: No dizziness, blackouts, seizures. No recent symptoms of stroke or mini- stroke. No recent episodes of slurred speech, or temporary blindness. Hx of TIA 2014.  Cardiac: No recent episodes of chest pain/pressure, no shortness of breath at rest. No shortness of breath with exertion. Denies history of atrial fibrillation or irregular heartbeat  Vascular: No history of rest pain in feet. No history of claudication. No history of non-healing ulcer, No history of DVT   Pulmonary: No home oxygen, no productive cough, no hemoptysis, No asthma or wheezing  Musculoskeletal: [ ]  Arthritis, [ ]  Low back pain, [ ]  Joint pain  Hematologic:No history of hypercoagulable state. No history of easy bleeding. No history of anemia  Gastrointestinal: No hematochezia or melena, No gastroesophageal reflux, no trouble swallowing  Urinary: [x ] chronic Kidney disease, [ ]  on HD - [ ]  MWF or [ ]  TTHS, [ ]  Burning with urination, [ ]  Frequent urination, [ ]  Difficulty urinating;   Skin: No rashes  Psychological: No history of anxiety, No history of depression   Physical Examination  Filed Vitals:   01/22/15 1413 01/22/15 1426  BP: 190/106 185/102  Pulse: 66   Height: 5\' 9"  (1.753 m)   Weight: 235 lb (106.595 kg)   SpO2: 98%     Body mass index is 34.69 kg/(m^2).  General: Alert and oriented, no acute distress HEENT: Normal, blindness left eye Neck: No bruit or JVD Pulmonary: Clear to auscultation bilaterally Cardiac: Regular Rate and Rhythm without murmur Gastrointestinal: Soft, non-tender, non-distended, no mass, no scars Skin: No  rash Extremity Pulses: 2+ radial, brachial pulses bilaterally Musculoskeletal: No deformity or edema Neurologic: Upper and lower extremity motor 5/5 and symmetric  DATA:  Vein mapping shows acceptable cephalic veins bilaterally > 0.30 cm diameter Brachial artery triphasic flow with >0.6 cm diameter  ASSESSMENT:  CKD stage IV   PLAN: We will plan on AV fistula creation Aug 9th by Dr. Scot Dock on the left UE. He is right hand dominant. Benefits and risks of the surgery were discussed.    Theda Sers, EMMA MAUREEN PA-C [facility]  The patient was seen in conjunction with Dr. Scot Dock

## 2015-03-06 NOTE — Transfer of Care (Signed)
Immediate Anesthesia Transfer of Care Note  Patient: Gregory Hood  Procedure(s) Performed: Procedure(s): LET [place] (Left)  Patient Location: PACU  Anesthesia Type:MAC  Level of Consciousness: awake, alert , oriented and patient cooperative  Airway & Oxygen Therapy: Patient Spontanous Breathing and Patient connected to nasal cannula oxygen  Post-op Assessment: Report given to RN, Post -op Vital signs reviewed and stable and Patient moving all extremities  Post vital signs: Reviewed and stable  Last Vitals:  Filed Vitals:   03/06/15 1353  BP:   Pulse:   Temp: 36.4 C  Resp:     Complications: No apparent anesthesia complications

## 2015-03-07 ENCOUNTER — Encounter (HOSPITAL_COMMUNITY): Payer: Self-pay | Admitting: Vascular Surgery

## 2015-03-07 LAB — COMPREHENSIVE METABOLIC PANEL
ALBUMIN: 3.3 g/dL — AB (ref 3.5–5.0)
ALT: 25 U/L (ref 17–63)
AST: 30 U/L (ref 15–41)
Alkaline Phosphatase: 67 U/L (ref 38–126)
Anion gap: 10 (ref 5–15)
BUN: 48 mg/dL — AB (ref 6–20)
CHLORIDE: 99 mmol/L — AB (ref 101–111)
CO2: 24 mmol/L (ref 22–32)
Calcium: 8.4 mg/dL — ABNORMAL LOW (ref 8.9–10.3)
Creatinine, Ser: 4.31 mg/dL — ABNORMAL HIGH (ref 0.61–1.24)
GFR calc Af Amer: 16 mL/min — ABNORMAL LOW (ref >60)
GFR, EST NON AFRICAN AMERICAN: 13 mL/min — AB (ref >60)
Glucose, Bld: 149 mg/dL — ABNORMAL HIGH (ref 65–99)
POTASSIUM: 4.3 mmol/L (ref 3.5–5.1)
SODIUM: 133 mmol/L — AB (ref 135–145)
Total Bilirubin: 0.7 mg/dL (ref 0.3–1.2)
Total Protein: 6.4 g/dL — ABNORMAL LOW (ref 6.5–8.1)

## 2015-03-07 MED ORDER — OXYCODONE-ACETAMINOPHEN 5-325 MG PO TABS
1.0000 | ORAL_TABLET | Freq: Once | ORAL | Status: AC
  Administered 2015-03-07: 1 via ORAL
  Filled 2015-03-07: qty 1

## 2015-03-07 MED ORDER — NEBIVOLOL HCL 5 MG PO TABS
5.0000 mg | ORAL_TABLET | Freq: Every day | ORAL | Status: AC
  Administered 2015-03-07: 5 mg via ORAL
  Filled 2015-03-07: qty 1

## 2015-03-07 NOTE — ED Notes (Signed)
Pt verbalized understanding of d/c instructions and has no further questions.,

## 2015-03-07 NOTE — Discharge Instructions (Signed)
Follow up with a neurologist for further evaluation of your seizure-like episodes. Do not drive a motor vehicle or operate machinery until cleared by a neurologist. Continue taking your prescribed medication. Be sure to eat regular meals and drink fluids regularly. Follow up with your primary care doctor as needed. Return to the ED, as needed, if symptoms worsen.  Seizure, Adult A seizure is abnormal electrical activity in the brain. Seizures usually last from 30 seconds to 2 minutes. There are various types of seizures. Before a seizure, you may have a warning sensation (aura) that a seizure is about to occur. An aura may include the following symptoms:   Fear or anxiety.  Nausea.  Feeling like the room is spinning (vertigo).  Vision changes, such as seeing flashing lights or spots. Common symptoms during a seizure include:  A change in attention or behavior (altered mental status).  Convulsions with rhythmic jerking movements.  Drooling.  Rapid eye movements.  Grunting.  Loss of bladder and bowel control.  Bitter taste in the mouth.  Tongue biting. After a seizure, you may feel confused and sleepy. You may also have an injury resulting from convulsions during the seizure. HOME CARE INSTRUCTIONS   If you are given medicines, take them exactly as prescribed by your health care provider.  Keep all follow-up appointments as directed by your health care provider.  Do not swim or drive or engage in risky activity during which a seizure could cause further injury to you or others until your health care provider says it is OK.  Get adequate rest.  Teach friends and family what to do if you have a seizure. They should:  Lay you on the ground to prevent a fall.  Put a cushion under your head.  Loosen any tight clothing around your neck.  Turn you on your side. If vomiting occurs, this helps keep your airway clear.  Stay with you until you recover.  Know whether or not you  need emergency care. SEEK IMMEDIATE MEDICAL CARE IF:  The seizure lasts longer than 5 minutes.  The seizure is severe or you do not wake up immediately after the seizure.  You have an altered mental status after the seizure.  You are having more frequent or worsening seizures. Someone should drive you to the emergency department or call local emergency services (911 in [place] MAKE SURE YOU:  Understand these instructions.  Will watch your condition.  Will get help right away if you are not doing well or get worse. Document Released: 06/11/2000 Document Revised: 04/04/2013 Document Reviewed: 01/24/2013 [facility] Patient Information 2015 [place], [place]. This information is not intended to replace advice given to you by your health care provider. Make sure you discuss any questions you have with your health care provider.

## 2015-03-07 NOTE — ED Provider Notes (Signed)
Patient had episode today where he stared into space became less responsive for approximate 5 minutes as witnessed by his wife . He was in an extremely hot environment at the time and had not eaten for several hours He's had similar episodes 6 times in the past 1 year. All prior episodes occurred while an extremely hot environment. He's been in evaluated by [facility] neurology service for same complaint. He is presently asymptomatic except for pain at left arm at site of AV fistula which was placed earlier today. Patient is alert and with her Glasgow Coma Score 15 amylase without difficulty. Assessment possibility of seizure though doubtful. Plan referral back to [place] neurology offce as outpatient  Orlie Dakin, MD 03/07/15 [phone]

## 2015-03-07 NOTE — ED Notes (Signed)
Pt given turkey sandwich and drink 

## 2015-04-02 ENCOUNTER — Other Ambulatory Visit: Payer: Self-pay

## 2015-04-02 DIAGNOSIS — N186 End stage renal disease: Secondary | ICD-10-CM

## 2015-04-02 DIAGNOSIS — Z48812 Encounter for surgical aftercare following surgery on the circulatory system: Secondary | ICD-10-CM

## 2015-04-03 ENCOUNTER — Telehealth: Payer: Self-pay | Admitting: Vascular Surgery

## 2015-04-03 NOTE — Telephone Encounter (Addendum)
-----   Message from Denman George, RN sent at 04/02/2015  4:48 PM EDT ----- Regarding: needs post op appt. Contact: [phone] This pt. needs a 4-6 week access duplex and OV with Dr. Scot Dock; s/p left BVT 03/06/15.  (I rec'd call from pt's wife questioning his f/u, as he has been in the hospital; Dr. Scot Dock advised a duplex of access in 4-6 wks, and OV)  Thanks.   Notified patient of post op appt. with dr. Scot Dock on 04-11-15 at [facility] for lab then 4PM to see dr.

## 2015-04-09 ENCOUNTER — Encounter: Payer: Self-pay | Admitting: Vascular Surgery

## 2015-04-11 ENCOUNTER — Ambulatory Visit (INDEPENDENT_AMBULATORY_CARE_PROVIDER_SITE_OTHER): Payer: Self-pay | Admitting: Vascular Surgery

## 2015-04-11 ENCOUNTER — Encounter: Payer: Self-pay | Admitting: Vascular Surgery

## 2015-04-11 ENCOUNTER — Ambulatory Visit (HOSPITAL_COMMUNITY)
Admission: RE | Admit: 2015-04-11 | Discharge: 2015-04-11 | Disposition: A | Discharge status: Home / Self Care | Payer: 59 | Source: Ambulatory Visit | Attending: Vascular Surgery | Admitting: Vascular Surgery

## 2015-04-11 ENCOUNTER — Other Ambulatory Visit: Payer: Self-pay | Admitting: Vascular Surgery

## 2015-04-11 VITALS — BP 172/90 | HR 80 | Ht 69.0 in | Wt 229.9 lb

## 2015-04-11 DIAGNOSIS — I120 Hypertensive chronic kidney disease with stage 5 chronic kidney disease or end stage renal disease: Secondary | ICD-10-CM | POA: Insufficient documentation

## 2015-04-11 DIAGNOSIS — N186 End stage renal disease: Secondary | ICD-10-CM

## 2015-04-11 DIAGNOSIS — N184 Chronic kidney disease, stage 4 (severe): Secondary | ICD-10-CM

## 2015-04-11 DIAGNOSIS — E11319 Type 2 diabetes mellitus with unspecified diabetic retinopathy without macular edema: Secondary | ICD-10-CM | POA: Diagnosis not present

## 2015-04-11 DIAGNOSIS — Z4931 Encounter for adequacy testing for hemodialysis: Secondary | ICD-10-CM | POA: Diagnosis not present

## 2015-04-11 DIAGNOSIS — E785 Hyperlipidemia, unspecified: Secondary | ICD-10-CM | POA: Diagnosis not present

## 2015-04-11 DIAGNOSIS — Z48812 Encounter for surgical aftercare following surgery on the circulatory system: Secondary | ICD-10-CM

## 2015-04-11 NOTE — Progress Notes (Signed)
Patient name: Gregory Hood MRN: [id number] DOB: [birth year] Sex: male  REASON FOR VISIT: Follow up after left basilic vein transposition  HPI: Gregory Hood is a 64 y.o. male who is not yet on dialysis. He underwent a left basilic vein transposition on 03/06/2015. He returns for 6 week follow up visit. He is doing well and has no specific complaints. He denies pain or paresthesias in his left arm.   Current Outpatient Prescriptions  Medication Sig Dispense Refill  . Ascorbic Acid (VITAMIN C) 1000 MG tablet Take 1,000 mg by mouth daily.    Marland Kitchen aspirin 81 MG tablet Take 162 mg by mouth daily.    Marland Kitchen atorvastatin (LIPITOR) 40 MG tablet Take 40 mg by mouth daily.    . calcitRIOL (ROCALTROL) 0.25 MCG capsule Take 0.25 mcg by mouth daily.    . Cholecalciferol (VITAMIN D3) 5000 UNITS TABS Take 5,000 Units by mouth daily.    . folic acid (FOLVITE) 1 MG tablet Take 2 tablets (2 mg total) by mouth daily. 180 tablet 3  . furosemide (LASIX) 80 MG tablet Take 80 mg by mouth daily.     . Garlic 123XX123 MG CAPS Take 1,000 mg by mouth daily.    . hydrALAZINE (APRESOLINE) 50 MG tablet Take 50 mg by mouth every 8 (eight) hours.     . insulin glargine (LANTUS) 100 UNIT/ML injection Inject 12 Units into the skin at bedtime.     Marland Kitchen linagliptin (TRADJENTA) 5 MG TABS tablet Take 5 mg by mouth daily as needed.     . nebivolol (BYSTOLIC) 5 MG tablet Take 10 mg by mouth daily at 6 PM.     . Omega-3 Fatty Acids (FISH OIL) 500 MG CAPS Take 500 mg by mouth daily.    Glory Rosebush VERIO test strip     . OVER THE COUNTER MEDICATION Take 1 capsule by mouth daily. Renafood    . oxyCODONE-acetaminophen (ROXICET) 5-325 MG per tablet Take 1-2 tablets by mouth every 4 (four) hours as needed for severe pain. 30 tablet 0  . prednisoLONE acetate (PRED FORTE) 1 % ophthalmic suspension Place 1 drop into the left eye 2 (two) times daily.      No current facility-administered medications for this visit.    REVIEW OF SYSTEMS:  [X]  denotes  positive finding, [ ]  denotes negative finding Cardiac  Comments:  Chest pain or chest pressure:    Shortness of breath upon exertion:    Short of breath when lying flat:    Irregular heart rhythm:    Constitutional    Fever or chills:      PHYSICAL EXAM: Filed Vitals:   04/11/15 1618 04/11/15 1620  BP: 176/93 172/90  Pulse: 80   Height: 5\' 9"  (1.753 m)   Weight: 229 lb 14.4 oz (104.282 kg)   SpO2: 100%     GENERAL: The patient is a well-nourished male, in no acute distress. The vital signs are documented above. CARDIOVASCULAR: There is a regular rate and rhythm. PULMONARY: There is good air exchange bilaterally without wheezing or rales. His left upper arm fistula has an excellent bruit and thrill. He has a palpable left radial pulse.  Duplex scan shows that the diameters of the fistula range from 0.72-0.83 cm. There are some increased philosophies of the proximal anastomosis and also at the confluence of the basilic vein and brachial vein near the axilla.  MEDICAL ISSUES:  STAGE IV CHRONIC KIDNEY DISEASE: hhis upper arm fistula appears to be  maturing adequately. I think this should be ready for dialysis if it was needed anytime after 6 weeks from now. On duplex he does have some mild narrowing at the arterial anastomosis and also at the junction of the basilic vein into the brachial system near the axilla. If there are any problems with flow in the fistula in the future then certainly he could be considered for a fistulogram and possible venoplasty. We'll see him back as needed.  HYPERTENSION: The patient's initial blood pressure today was elevated. We repeated this and this was still elevated. We have encouraged the patient to follow up with their primary care physician for management of their blood pressure.   Deitra Mayo [facility]: [phone]

## 2018-03-22 ENCOUNTER — Other Ambulatory Visit (HOSPITAL_COMMUNITY): Payer: Medicare Other

## 2018-03-22 ENCOUNTER — Emergency Department (HOSPITAL_COMMUNITY): Payer: Medicare Other

## 2018-03-22 ENCOUNTER — Other Ambulatory Visit: Payer: Self-pay

## 2018-03-22 ENCOUNTER — Inpatient Hospital Stay (HOSPITAL_COMMUNITY)
Admission: EM | Admit: 2018-03-22 | Discharge: 2018-03-30 | DRG: 291 | Disposition: A | Discharge status: Skilled Nursing Facility | Payer: Medicare Other | Attending: Internal Medicine | Admitting: Internal Medicine

## 2018-03-22 ENCOUNTER — Encounter (HOSPITAL_COMMUNITY): Payer: Self-pay

## 2018-03-22 DIAGNOSIS — I770 Arteriovenous fistula, acquired: Secondary | ICD-10-CM

## 2018-03-22 DIAGNOSIS — Z841 Family history of disorders of kidney and ureter: Secondary | ICD-10-CM

## 2018-03-22 DIAGNOSIS — E785 Hyperlipidemia, unspecified: Secondary | ICD-10-CM | POA: Diagnosis present

## 2018-03-22 DIAGNOSIS — D631 Anemia in chronic kidney disease: Secondary | ICD-10-CM | POA: Diagnosis present

## 2018-03-22 DIAGNOSIS — Z7982 Long term (current) use of aspirin: Secondary | ICD-10-CM

## 2018-03-22 DIAGNOSIS — Z6838 Body mass index (BMI) 38.0-38.9, adult: Secondary | ICD-10-CM

## 2018-03-22 DIAGNOSIS — I5043 Acute on chronic combined systolic (congestive) and diastolic (congestive) heart failure: Secondary | ICD-10-CM

## 2018-03-22 DIAGNOSIS — Z8673 Personal history of transient ischemic attack (TIA), and cerebral infarction without residual deficits: Secondary | ICD-10-CM

## 2018-03-22 DIAGNOSIS — I313 Pericardial effusion (noninflammatory): Secondary | ICD-10-CM | POA: Diagnosis present

## 2018-03-22 DIAGNOSIS — E669 Obesity, unspecified: Secondary | ICD-10-CM | POA: Diagnosis present

## 2018-03-22 DIAGNOSIS — I3139 Other pericardial effusion (noninflammatory): Secondary | ICD-10-CM

## 2018-03-22 DIAGNOSIS — I10 Essential (primary) hypertension: Secondary | ICD-10-CM | POA: Diagnosis not present

## 2018-03-22 DIAGNOSIS — N2581 Secondary hyperparathyroidism of renal origin: Secondary | ICD-10-CM | POA: Diagnosis present

## 2018-03-22 DIAGNOSIS — D696 Thrombocytopenia, unspecified: Secondary | ICD-10-CM | POA: Diagnosis not present

## 2018-03-22 DIAGNOSIS — K4090 Unilateral inguinal hernia, without obstruction or gangrene, not specified as recurrent: Secondary | ICD-10-CM | POA: Diagnosis present

## 2018-03-22 DIAGNOSIS — Z794 Long term (current) use of insulin: Secondary | ICD-10-CM

## 2018-03-22 DIAGNOSIS — Z87891 Personal history of nicotine dependence: Secondary | ICD-10-CM | POA: Diagnosis not present

## 2018-03-22 DIAGNOSIS — N186 End stage renal disease: Secondary | ICD-10-CM | POA: Diagnosis present

## 2018-03-22 DIAGNOSIS — I081 Rheumatic disorders of both mitral and tricuspid valves: Secondary | ICD-10-CM | POA: Diagnosis present

## 2018-03-22 DIAGNOSIS — Z7902 Long term (current) use of antithrombotics/antiplatelets: Secondary | ICD-10-CM

## 2018-03-22 DIAGNOSIS — Z833 Family history of diabetes mellitus: Secondary | ICD-10-CM

## 2018-03-22 DIAGNOSIS — D649 Anemia, unspecified: Secondary | ICD-10-CM | POA: Diagnosis present

## 2018-03-22 DIAGNOSIS — I132 Hypertensive heart and chronic kidney disease with heart failure and with stage 5 chronic kidney disease, or end stage renal disease: Principal | ICD-10-CM | POA: Diagnosis present

## 2018-03-22 DIAGNOSIS — I272 Pulmonary hypertension, unspecified: Secondary | ICD-10-CM | POA: Diagnosis present

## 2018-03-22 DIAGNOSIS — R609 Edema, unspecified: Secondary | ICD-10-CM | POA: Diagnosis not present

## 2018-03-22 DIAGNOSIS — R778 Other specified abnormalities of plasma proteins: Secondary | ICD-10-CM | POA: Diagnosis present

## 2018-03-22 DIAGNOSIS — Z79899 Other long term (current) drug therapy: Secondary | ICD-10-CM

## 2018-03-22 DIAGNOSIS — I5033 Acute on chronic diastolic (congestive) heart failure: Secondary | ICD-10-CM

## 2018-03-22 DIAGNOSIS — J449 Chronic obstructive pulmonary disease, unspecified: Secondary | ICD-10-CM | POA: Diagnosis present

## 2018-03-22 DIAGNOSIS — E11649 Type 2 diabetes mellitus with hypoglycemia without coma: Secondary | ICD-10-CM | POA: Diagnosis present

## 2018-03-22 DIAGNOSIS — N179 Acute kidney failure, unspecified: Secondary | ICD-10-CM

## 2018-03-22 DIAGNOSIS — Z8249 Family history of ischemic heart disease and other diseases of the circulatory system: Secondary | ICD-10-CM | POA: Diagnosis not present

## 2018-03-22 DIAGNOSIS — E11319 Type 2 diabetes mellitus with unspecified diabetic retinopathy without macular edema: Secondary | ICD-10-CM | POA: Diagnosis present

## 2018-03-22 DIAGNOSIS — H5462 Unqualified visual loss, left eye, normal vision right eye: Secondary | ICD-10-CM | POA: Diagnosis present

## 2018-03-22 DIAGNOSIS — N19 Unspecified kidney failure: Secondary | ICD-10-CM | POA: Diagnosis present

## 2018-03-22 DIAGNOSIS — E872 Acidosis, unspecified: Secondary | ICD-10-CM | POA: Diagnosis present

## 2018-03-22 DIAGNOSIS — E1122 Type 2 diabetes mellitus with diabetic chronic kidney disease: Secondary | ICD-10-CM | POA: Diagnosis present

## 2018-03-22 DIAGNOSIS — Z992 Dependence on renal dialysis: Secondary | ICD-10-CM

## 2018-03-22 DIAGNOSIS — G4733 Obstructive sleep apnea (adult) (pediatric): Secondary | ICD-10-CM | POA: Diagnosis present

## 2018-03-22 DIAGNOSIS — N400 Enlarged prostate without lower urinary tract symptoms: Secondary | ICD-10-CM | POA: Diagnosis present

## 2018-03-22 DIAGNOSIS — R7989 Other specified abnormal findings of blood chemistry: Secondary | ICD-10-CM | POA: Diagnosis present

## 2018-03-22 DIAGNOSIS — Z9989 Dependence on other enabling machines and devices: Secondary | ICD-10-CM

## 2018-03-22 DIAGNOSIS — Z91030 Bee allergy status: Secondary | ICD-10-CM

## 2018-03-22 DIAGNOSIS — I639 Cerebral infarction, unspecified: Secondary | ICD-10-CM | POA: Insufficient documentation

## 2018-03-22 DIAGNOSIS — R011 Cardiac murmur, unspecified: Secondary | ICD-10-CM

## 2018-03-22 DIAGNOSIS — Z9119 Patient's noncompliance with other medical treatment and regimen: Secondary | ICD-10-CM

## 2018-03-22 DIAGNOSIS — Z888 Allergy status to other drugs, medicaments and biological substances status: Secondary | ICD-10-CM

## 2018-03-22 DIAGNOSIS — I509 Heart failure, unspecified: Secondary | ICD-10-CM

## 2018-03-22 DIAGNOSIS — R0602 Shortness of breath: Secondary | ICD-10-CM | POA: Diagnosis present

## 2018-03-22 DIAGNOSIS — I361 Nonrheumatic tricuspid (valve) insufficiency: Secondary | ICD-10-CM | POA: Diagnosis not present

## 2018-03-22 LAB — CBC
HCT: 27.1 % — ABNORMAL LOW (ref 39.0–52.0)
HEMATOCRIT: 27.1 % — AB (ref 39.0–52.0)
HEMOGLOBIN: 8.6 g/dL — AB (ref 13.0–17.0)
Hemoglobin: 8.7 g/dL — ABNORMAL LOW (ref 13.0–17.0)
MCH: 30 pg (ref 26.0–34.0)
MCH: 30.1 pg (ref 26.0–34.0)
MCHC: 31.7 g/dL (ref 30.0–36.0)
MCHC: 32.1 g/dL (ref 30.0–36.0)
MCV: 94.4 fL (ref 78.0–100.0)
MCV: 93.8 fL (ref 78.0–100.0)
PLATELETS: 109 10*3/uL — AB (ref 150–400)
Platelets: 101 10*3/uL — ABNORMAL LOW (ref 150–400)
RBC: 2.87 MIL/uL — ABNORMAL LOW (ref 4.22–5.81)
RBC: 2.89 MIL/uL — ABNORMAL LOW (ref 4.22–5.81)
RDW: 14.4 % (ref 11.5–15.5)
RDW: 14.4 % (ref 11.5–15.5)
WBC: 5.1 10*3/uL (ref 4.0–10.5)
WBC: 5 10*3/uL (ref 4.0–10.5)

## 2018-03-22 LAB — BASIC METABOLIC PANEL
ANION GAP: 12 (ref 5–15)
BUN: 99 mg/dL — ABNORMAL HIGH (ref 8–23)
CHLORIDE: 107 mmol/L (ref 98–111)
CO2: 16 mmol/L — AB (ref 22–32)
Calcium: 8.5 mg/dL — ABNORMAL LOW (ref 8.9–10.3)
Creatinine, Ser: 9.26 mg/dL — ABNORMAL HIGH (ref 0.61–1.24)
GFR calc Af Amer: 6 mL/min — ABNORMAL LOW (ref >60)
GFR, EST NON AFRICAN AMERICAN: 5 mL/min — AB (ref >60)
GLUCOSE: 139 mg/dL — AB (ref 70–99)
POTASSIUM: 5 mmol/L (ref 3.5–5.1)
Sodium: 135 mmol/L (ref 135–145)

## 2018-03-22 LAB — RENAL FUNCTION PANEL
ALBUMIN: 2.8 g/dL — AB (ref 3.5–5.0)
ANION GAP: 10 (ref 5–15)
BUN: 99 mg/dL — ABNORMAL HIGH (ref 8–23)
CHLORIDE: 108 mmol/L (ref 98–111)
CO2: 17 mmol/L — ABNORMAL LOW (ref 22–32)
Calcium: 8.5 mg/dL — ABNORMAL LOW (ref 8.9–10.3)
Creatinine, Ser: 9.42 mg/dL — ABNORMAL HIGH (ref 0.61–1.24)
GFR calc Af Amer: 6 mL/min — ABNORMAL LOW (ref >60)
GFR calc non Af Amer: 5 mL/min — ABNORMAL LOW (ref >60)
Glucose, Bld: 185 mg/dL — ABNORMAL HIGH (ref 70–99)
PHOSPHORUS: 6.2 mg/dL — AB (ref 2.5–4.6)
POTASSIUM: 5 mmol/L (ref 3.5–5.1)
Sodium: 135 mmol/L (ref 135–145)

## 2018-03-22 LAB — HEPATIC FUNCTION PANEL
ALT: 19 U/L (ref 0–44)
AST: 20 U/L (ref 15–41)
Albumin: 3.7 g/dL (ref 3.5–5.0)
Alkaline Phosphatase: 54 U/L (ref 38–126)
BILIRUBIN DIRECT: 0.2 mg/dL (ref 0.0–0.2)
Indirect Bilirubin: 0.7 mg/dL (ref 0.3–0.9)
Total Bilirubin: 0.9 mg/dL (ref 0.3–1.2)
Total Protein: 7.3 g/dL (ref 6.5–8.1)

## 2018-03-22 LAB — GLUCOSE, CAPILLARY
GLUCOSE-CAPILLARY: 114 mg/dL — AB (ref 70–99)
Glucose-Capillary: 127 mg/dL — ABNORMAL HIGH (ref 70–99)
Glucose-Capillary: 127 mg/dL — ABNORMAL HIGH (ref 70–99)
Glucose-Capillary: 169 mg/dL — ABNORMAL HIGH (ref 70–99)
Glucose-Capillary: 141 mg/dL — ABNORMAL HIGH (ref 70–99)

## 2018-03-22 LAB — I-STAT TROPONIN, ED: Troponin i, poc: 0.06 ng/mL (ref 0.00–0.08)

## 2018-03-22 LAB — SAVE SMEAR

## 2018-03-22 LAB — HEPATITIS B SURFACE ANTIGEN: Hepatitis B Surface Ag: NEGATIVE

## 2018-03-22 LAB — MAGNESIUM: Magnesium: 2.2 mg/dL (ref 1.7–2.4)

## 2018-03-22 LAB — BRAIN NATRIURETIC PEPTIDE: B Natriuretic Peptide: 4007.2 pg/mL — ABNORMAL HIGH (ref 0.0–100.0)

## 2018-03-22 LAB — TROPONIN I
TROPONIN I: 0.07 ng/mL — AB (ref <0.03)
TROPONIN I: 0.07 ng/mL — AB (ref <0.03)
Troponin I: 0.07 ng/mL (ref <0.03)

## 2018-03-22 LAB — HIV ANTIBODY (ROUTINE TESTING W REFLEX): HIV Screen 4th Generation wRfx: NONREACTIVE

## 2018-03-22 MED ORDER — CHLORHEXIDINE GLUCONATE CLOTH 2 % EX PADS: 6.0000 | MEDICATED_PAD | Freq: Every day | CUTANEOUS | Status: DC

## 2018-03-22 MED ORDER — HYDRALAZINE HCL 25 MG PO TABS
25.0000 mg | ORAL_TABLET | Freq: Three times a day (TID) | ORAL | Status: DC
  Administered 2018-03-22 – 2018-03-30 (×25): 25 mg via ORAL
  Filled 2018-03-22 (×26): qty 1

## 2018-03-22 MED ORDER — CALCITRIOL 0.25 MCG PO CAPS
0.2500 ug | ORAL_CAPSULE | Freq: Every day | ORAL | Status: DC
  Administered 2018-03-22 – 2018-03-30 (×8): 0.25 ug via ORAL
  Filled 2018-03-22 (×8): qty 1

## 2018-03-22 MED ORDER — FOLIC ACID 1 MG PO TABS
2.0000 mg | ORAL_TABLET | Freq: Every day | ORAL | Status: DC
  Administered 2018-03-22 – 2018-03-30 (×9): 2 mg via ORAL
  Filled 2018-03-22 (×9): qty 2

## 2018-03-22 MED ORDER — EZETIMIBE 10 MG PO TABS
10.0000 mg | ORAL_TABLET | Freq: Every day | ORAL | Status: DC
  Administered 2018-03-22 – 2018-03-30 (×8): 10 mg via ORAL
  Filled 2018-03-22 (×8): qty 1

## 2018-03-22 MED ORDER — PREDNISOLONE ACETATE 1 % OP SUSP
1.0000 [drp] | Freq: Two times a day (BID) | OPHTHALMIC | Status: DC
  Administered 2018-03-22 – 2018-03-30 (×14): 1 [drp] via OPHTHALMIC
  Filled 2018-03-22 (×2): qty 5

## 2018-03-22 MED ORDER — PATIROMER SORBITEX CALCIUM 8.4 G PO PACK
8.4000 g | PACK | Freq: Every day | ORAL | Status: DC
  Administered 2018-03-23: 8.4 g via ORAL
  Filled 2018-03-22 (×2): qty 1

## 2018-03-22 MED ORDER — INSULIN ASPART 100 UNIT/ML ~~LOC~~ SOLN
0.0000 [IU] | Freq: Three times a day (TID) | SUBCUTANEOUS | Status: DC
  Administered 2018-03-22: 4 [IU] via SUBCUTANEOUS
  Administered 2018-03-23: 3 [IU] via SUBCUTANEOUS
  Administered 2018-03-23: 4 [IU] via SUBCUTANEOUS
  Administered 2018-03-24 – 2018-03-26 (×4): 3 [IU] via SUBCUTANEOUS

## 2018-03-22 MED ORDER — INSULIN GLARGINE 100 UNIT/ML ~~LOC~~ SOLN
7.0000 [IU] | Freq: Every day | SUBCUTANEOUS | Status: DC
  Administered 2018-03-24 – 2018-03-26 (×3): 7 [IU] via SUBCUTANEOUS
  Filled 2018-03-22 (×5): qty 0.07

## 2018-03-22 MED ORDER — HEPARIN SODIUM (PORCINE) 5000 UNIT/ML IJ SOLN
5000.0000 [IU] | Freq: Three times a day (TID) | INTRAMUSCULAR | Status: DC
  Administered 2018-03-22 – 2018-03-30 (×25): 5000 [IU] via SUBCUTANEOUS
  Filled 2018-03-22 (×25): qty 1

## 2018-03-22 MED ORDER — SODIUM CHLORIDE 0.9 % IV SOLN: 100.0000 mL | INTRAVENOUS | Status: DC | PRN

## 2018-03-22 MED ORDER — SODIUM BICARBONATE 650 MG PO TABS
650.0000 mg | ORAL_TABLET | Freq: Two times a day (BID) | ORAL | Status: DC
  Administered 2018-03-22 – 2018-03-23 (×3): 650 mg via ORAL
  Filled 2018-03-22 (×4): qty 1

## 2018-03-22 MED ORDER — POLYETHYLENE GLYCOL 3350 17 G PO PACK
17.0000 g | PACK | Freq: Every day | ORAL | Status: DC | PRN
  Administered 2018-03-26 – 2018-03-28 (×2): 17 g via ORAL
  Filled 2018-03-22 (×3): qty 1

## 2018-03-22 MED ORDER — FUROSEMIDE 10 MG/ML IJ SOLN
80.0000 mg | Freq: Once | INTRAMUSCULAR | Status: AC
  Administered 2018-03-22: 80 mg via INTRAVENOUS
  Filled 2018-03-22: qty 8

## 2018-03-22 MED ORDER — HEPARIN SODIUM (PORCINE) 1000 UNIT/ML DIALYSIS: 1000.0000 [IU] | INTRAMUSCULAR | Status: DC | PRN

## 2018-03-22 MED ORDER — FUROSEMIDE 10 MG/ML IJ SOLN
40.0000 mg | Freq: Once | INTRAMUSCULAR | Status: AC
  Administered 2018-03-22: 40 mg via INTRAVENOUS
  Filled 2018-03-22: qty 4

## 2018-03-22 MED ORDER — ASPIRIN 81 MG PO CHEW
81.0000 mg | CHEWABLE_TABLET | Freq: Every day | ORAL | Status: DC
  Administered 2018-03-22 – 2018-03-30 (×8): 81 mg via ORAL
  Filled 2018-03-22 (×8): qty 1

## 2018-03-22 MED ORDER — VITAMIN D 1000 UNITS PO TABS
2000.0000 [IU] | ORAL_TABLET | Freq: Every day | ORAL | Status: DC
  Administered 2018-03-22 – 2018-03-30 (×9): 2000 [IU] via ORAL
  Filled 2018-03-22 (×9): qty 2

## 2018-03-22 MED ORDER — FUROSEMIDE 10 MG/ML IJ SOLN
120.0000 mg | Freq: Two times a day (BID) | INTRAVENOUS | Status: DC
  Administered 2018-03-22: 120 mg via INTRAVENOUS
  Filled 2018-03-22: qty 10

## 2018-03-22 MED ORDER — PENTAFLUOROPROP-TETRAFLUOROETH EX AERO: 1.0000 "application " | INHALATION_SPRAY | CUTANEOUS | Status: DC | PRN

## 2018-03-22 MED ORDER — CARVEDILOL 25 MG PO TABS
25.0000 mg | ORAL_TABLET | Freq: Two times a day (BID) | ORAL | Status: DC
  Administered 2018-03-22 – 2018-03-30 (×15): 25 mg via ORAL
  Filled 2018-03-22: qty 1
  Filled 2018-03-22: qty 2
  Filled 2018-03-22 (×11): qty 1
  Filled 2018-03-22: qty 2
  Filled 2018-03-22 (×2): qty 1

## 2018-03-22 MED ORDER — SODIUM CHLORIDE 0.9% FLUSH
3.0000 mL | Freq: Two times a day (BID) | INTRAVENOUS | Status: DC
  Administered 2018-03-22 – 2018-03-30 (×12): 3 mL via INTRAVENOUS

## 2018-03-22 MED ORDER — FINASTERIDE 5 MG PO TABS
5.0000 mg | ORAL_TABLET | Freq: Every day | ORAL | Status: DC
  Administered 2018-03-22 – 2018-03-30 (×9): 5 mg via ORAL
  Filled 2018-03-22 (×9): qty 1

## 2018-03-22 MED ORDER — ALTEPLASE 2 MG IJ SOLR: 2.0000 mg | Freq: Once | INTRAMUSCULAR | Status: DC | PRN

## 2018-03-22 MED ORDER — CHLORHEXIDINE GLUCONATE CLOTH 2 % EX PADS
6.0000 | MEDICATED_PAD | Freq: Every day | CUTANEOUS | Status: DC
  Administered 2018-03-22: 6 via TOPICAL

## 2018-03-22 MED ORDER — ACETAMINOPHEN 325 MG PO TABS
650.0000 mg | ORAL_TABLET | Freq: Four times a day (QID) | ORAL | Status: DC | PRN
  Administered 2018-03-24 – 2018-03-26 (×2): 650 mg via ORAL
  Filled 2018-03-22 (×2): qty 2

## 2018-03-22 MED ORDER — AMLODIPINE BESYLATE 10 MG PO TABS
10.0000 mg | ORAL_TABLET | Freq: Every day | ORAL | Status: DC
  Administered 2018-03-22 – 2018-03-30 (×8): 10 mg via ORAL
  Filled 2018-03-22 (×8): qty 1

## 2018-03-22 MED ORDER — NIACIN 100 MG PO TABS
200.0000 mg | ORAL_TABLET | Freq: Every day | ORAL | Status: DC
  Administered 2018-03-22 – 2018-03-29 (×8): 200 mg via ORAL
  Filled 2018-03-22 (×10): qty 2

## 2018-03-22 MED ORDER — LIDOCAINE HCL (PF) 1 % IJ SOLN: 5.0000 mL | INTRAMUSCULAR | Status: DC | PRN

## 2018-03-22 MED ORDER — ACETAMINOPHEN 650 MG RE SUPP: 650.0000 mg | Freq: Four times a day (QID) | RECTAL | Status: DC | PRN

## 2018-03-22 MED ORDER — LIDOCAINE-PRILOCAINE 2.5-2.5 % EX CREA: 1.0000 "application " | TOPICAL_CREAM | CUTANEOUS | Status: DC | PRN

## 2018-03-22 NOTE — ED Triage Notes (Signed)
Patient BIB [facility] for increasing SOB x 1 month. Patient states "I was getting out of bed pulling up with both of my arms and felt this sharpe chest pain and it was hard to catch my breath". Denies chest pain at this time. Patient also reports a history of needing dialysis "but they haven't started yet. They put my fistula in 2 years ago". Denies abdominal pain, dysuria, fever, or cough.

## 2018-03-22 NOTE — Progress Notes (Signed)
CRITICAL VALUE ALERT  Critical Value:  Troponin= 0.07  Date & Time Notied:  03/22/18 0834  Provider Notified: yes  Orders Received/Actions taken: no new orders

## 2018-03-22 NOTE — Progress Notes (Signed)
  Pt admitted to the unit. Pt is stable, alert and oriented per baseline. Oriented to room, staff, and call bell. Educated to call for any assistance. Bed in lowest position, call bell within reach- will continue to monitor. 

## 2018-03-22 NOTE — Progress Notes (Signed)
MD paged concerning pt's b/p this am. No further orders at this time

## 2018-03-22 NOTE — ED Provider Notes (Addendum)
[place] [facility] Provider Note  CSN: [id number] Arrival date & time: 03/22/18 0016  Chief Complaint(s) Shortness of Breath and Chest Pain  HPI Gregory Hood is a 67 y.o. male   HPI   1 month of worsening BLE edema and DOE.  Patient also endorsing increased fatigue and difficulty exerting himself even with daily ADLs.  States that he has been compliant with his Lasix.  Also complains of moderate bilateral sharp pectoral pain only while pulling himself up and using these muscles. No substernal or exertional chest pain. No fever, coughing, congestion, n/v, abd pain.   H/o dCHF (EF 50% in 12/2017) on Lasix.  H/o ESRD supposed to be on HD but has not had it due to access difficulty and has not been back to attempt again.   Past Medical History Past Medical History:  Diagnosis Date  . Chronic kidney disease   . COPD (chronic obstructive pulmonary disease) ([place])   . Diabetes mellitus without complication ([place])   . Diabetic retinopathy ([place])   . Hyperlipidemia   . Hypertension   . Obesity   . Orthostatic hypotension   . OSA on CPAP   . Retinal detachment   . Seizures ([place])   . Stroke ([place])   . TIA (transient ischemic attack)   . Umbilical hernia    Patient Active Problem List   Diagnosis Date Noted  . Acute exacerbation of CHF (congestive heart failure) ([place]) 03/22/2018  . CVA (cerebral vascular accident) ([place]) 03/22/2018  . OSA (obstructive sleep apnea) 03/22/2018  . BPH (benign prostatic hyperplasia) 03/22/2018  . Thrombocytopenia ([place]) 03/22/2018  . Normocytic anemia 03/22/2018  . End stage renal disease ([place]) 03/14/2014  . Orthostatic hypotension 02/22/2014  . Chronic diastolic heart failure ([place]) 02/22/2014  . Elevated lipids 02/22/2014  . Essential hypertension, benign 02/17/2014  . DM (diabetes mellitus), type 2 with renal complications ([place]) 95/28/4132  . CKD (chronic kidney disease), stage IV ([place]) 02/17/2014  . Abnormal ECG 02/17/2014   Home  Medication(s) Prior to Admission medications   Medication Sig Start Date End Date Taking? Authorizing Provider  Ascorbic Acid (VITAMIN C) 1000 MG tablet Take 1,000 mg by mouth daily.    [provider]  aspirin 81 MG tablet Take 162 mg by mouth daily.    [provider]  atorvastatin (LIPITOR) 40 MG tablet Take 40 mg by mouth daily.    [provider]  calcitRIOL (ROCALTROL) 0.25 MCG capsule Take 0.25 mcg by mouth daily.    [provider]  Cholecalciferol (VITAMIN D3) 5000 UNITS TABS Take 5,000 Units by mouth daily.    [provider]  folic acid (FOLVITE) 1 MG tablet Take 2 tablets (2 mg total) by mouth daily. 04/03/14   Josue Hector, MD  furosemide (LASIX) 80 MG tablet Take 80 mg by mouth daily.  01/25/14   [provider]  Garlic 4401 MG CAPS Take 1,000 mg by mouth daily.    [provider]  hydrALAZINE (APRESOLINE) 50 MG tablet Take 50 mg by mouth every 8 (eight) hours.     [provider]  insulin glargine (LANTUS) 100 UNIT/ML injection Inject 12 Units into the skin at bedtime.     [provider]  linagliptin (TRADJENTA) 5 MG TABS tablet Take 5 mg by mouth daily as needed.     [provider]  nebivolol (BYSTOLIC) 5 MG tablet Take 10 mg by mouth daily at 6 PM.     [provider]  Omega-3 Fatty Acids (FISH OIL) 500 MG CAPS Take 500 mg by mouth daily.    [provider]  [facility] VERIO test strip  01/08/14   [provider]  OVER THE COUNTER MEDICATION Take 1 capsule by mouth daily. Renafood    [provider]  oxyCODONE-acetaminophen (ROXICET) 5-325 MG per tablet Take 1-2 tablets by mouth every 4 (four) hours as needed for severe pain. 03/06/15   Angelia Mould, MD  prednisoLONE acetate (PRED FORTE) 1 % ophthalmic suspension Place 1 drop into the left eye 2 (two) times daily.     [provider]                                                                                                                                     Past Surgical History Past Surgical History:  Procedure Laterality Date  . BASCILIC VEIN TRANSPOSITION Left 03/06/2015   Procedure: LET BASCILIC VEIN TRANSPOSITION;  Surgeon: Angelia Mould, MD;  Location: [place];  Service: Vascular;  Laterality: Left;  . CIRCUMCISION    . EYE SURGERY Bilateral    had surgery several different times.  Marland Kitchen HAMMER TOE SURGERY Left 30 years ago   small toe left toe  . SEPTOPLASTY  20 years ago   Family History Family History  Problem Relation Age of Onset  . Hypertension Mother   . Diabetes type II Mother   . Transient ischemic attack Mother   . Kidney disease Mother   . Diabetes Mother   . Varicose Veins Mother   . Hypertension Father   . Diabetes type II Father   . Diabetes Father   . Diabetes type II Sister   . Hypertension Sister   . Diabetes Sister   . Learning disabilities Maternal Uncle     Social History Social History   Tobacco Use  . Smoking status: Former Smoker    Types: Cigarettes    Last attempt to quit: 03/05/1974    Years since quitting: 44.0  . Smokeless tobacco: Never Used  Substance Use Topics  . Alcohol use: No    Alcohol/week: 0.0 standard drinks  . Drug use: No   Allergies Bee venom; Lexiscan [regadenoson]; and Ace inhibitors  Review of Systems Review of Systems All other systems are reviewed and are negative for acute change except as noted in the HPI  Physical Exam Vital Signs  I have reviewed the triage vital signs BP (!) 161/83   Pulse 70   Temp 98.2 F (36.8 C) (Oral)   Resp 12   Ht 5\' 9"  (1.753 m)   Wt 99.3 kg   SpO2 100%   BMI 32.34 kg/m   Physical Exam  Constitutional: He is oriented to person, place, and time. He appears well-developed and well-nourished. No distress.  HENT:  Head: Normocephalic and atraumatic.  Nose: Nose normal.  Eyes: Pupils are equal, round, and reactive to  light. Conjunctivae and EOM are  normal. Right eye exhibits no discharge. Left eye exhibits no discharge. No scleral icterus.  Neck: Normal range of motion. Neck supple.  Cardiovascular: Normal rate and regular rhythm. Exam reveals no gallop and no friction rub.  No murmur heard. Pulmonary/Chest: Effort normal and breath sounds normal. No stridor. No respiratory distress. He has no rales.  Abdominal: Soft. He exhibits no distension. There is no tenderness.  Musculoskeletal: He exhibits no edema or tenderness.  3+ BLE pitting edema  Neurological: He is alert and oriented to person, place, and time.  Skin: Skin is warm and dry. No rash noted. He is not diaphoretic. No erythema.  Psychiatric: He has a normal mood and affect.  Vitals reviewed.   ED Results and Treatments Labs (all labs ordered are listed, but only abnormal results are displayed) Labs Reviewed  BASIC METABOLIC PANEL - Abnormal; Notable for the following components:      Result Value   CO2 16 (*)    Glucose, Bld 139 (*)    BUN 99 (*)    Creatinine, Ser 9.26 (*)    Calcium 8.5 (*)    GFR calc non Af Amer 5 (*)    GFR calc Af Amer 6 (*)    All other components within normal limits  CBC - Abnormal; Notable for the following components:   RBC 2.87 (*)    Hemoglobin 8.6 (*)    HCT 27.1 (*)    Platelets 101 (*)    All other components within normal limits  BRAIN NATRIURETIC PEPTIDE - Abnormal; Notable for the following components:   B Natriuretic Peptide 4,007.2 (*)    All other components within normal limits  HEPATIC FUNCTION PANEL  HIV ANTIBODY (ROUTINE TESTING W REFLEX)  TROPONIN I  TROPONIN I  TROPONIN I  MAGNESIUM  RENAL FUNCTION PANEL  SAVE SMEAR  PATHOLOGIST SMEAR REVIEW  I-STAT TROPONIN, ED                                                                                                                         EKG  EKG Interpretation  Date/Time:  Wednesday March 22 2018 00:33:38 EDT Ventricular Rate:  71 PR Interval:    QRS  Duration: 97 QT Interval:  416 QTC Calculation: 453 R Axis:   88 Text Interpretation:  Sinus rhythm Low voltage with right axis deviation Otherwise no significant change Confirmed by Addison Lank [phone]) on 03/22/2018 1:42:34 AM Also confirmed by Addison Lank [phone]), editor Philomena Doheny [phone])  on 03/22/2018 8:00:07 AM      Radiology Dg Chest 2 View  Result Date: 03/22/2018 CLINICAL DATA:  67 year old male with shortness of breath. EXAM: CHEST - 2 VIEW COMPARISON:  Chest radiograph dated 01/22/2018 FINDINGS: There is a small left pleural effusion with left lung base atelectasis. Infiltrate is not excluded. The right lung is clear. No pneumothorax. Stable cardiomegaly. Right pectoral pacemaker device. No acute osseous pathology. IMPRESSION: Small left pleural effusion and  left lung base atelectasis versus infiltrate. Electronically Signed   By: Anner Crete M.D.   On: 03/22/2018 01:31   Pertinent labs & imaging results that were available during my care of the patient were reviewed by me and considered in my medical decision making (see chart for details).  Medications Ordered in ED Medications  aspirin chewable tablet 81 mg (has no administration in time range)  hydrALAZINE (APRESOLINE) tablet 25 mg (25 mg Oral Given 03/22/18 0552)  calcitRIOL (ROCALTROL) capsule 0.25 mcg (has no administration in time range)  folic acid (FOLVITE) tablet 2 mg (has no administration in time range)  prednisoLONE acetate (PRED FORTE) 1 % ophthalmic suspension 1 drop (has no administration in time range)  amLODipine (NORVASC) tablet 10 mg (has no administration in time range)  cholecalciferol (VITAMIN D) tablet 2,000 Units (has no administration in time range)  carvedilol (COREG) tablet 25 mg (has no administration in time range)  ezetimibe (ZETIA) tablet 10 mg (has no administration in time range)  finasteride (PROSCAR) tablet 5 mg (has no administration in time range)  niacin tablet 200 mg (has no  administration in time range)  patiromer Daryll Drown) packet 8.4 g (has no administration in time range)  sodium bicarbonate tablet 650 mg (has no administration in time range)  heparin injection 5,000 Units (5,000 Units Subcutaneous Given 03/22/18 0552)  sodium chloride flush (NS) 0.9 % injection 3 mL (has no administration in time range)  acetaminophen (TYLENOL) tablet 650 mg (has no administration in time range)    Or  acetaminophen (TYLENOL) suppository 650 mg (has no administration in time range)  polyethylene glycol (MIRALAX / GLYCOLAX) packet 17 g (has no administration in time range)  insulin aspart (novoLOG) injection 0-20 Units (has no administration in time range)  insulin glargine (LANTUS) injection 7 Units (has no administration in time range)  furosemide (LASIX) 120 mg in dextrose 5 % 50 mL IVPB (has no administration in time range)  furosemide (LASIX) injection 80 mg (80 mg Intravenous Given 03/22/18 0345)  furosemide (LASIX) injection 40 mg (40 mg Intravenous Given 03/22/18 0552)                                                                                                                                    Procedures Procedures   EMERGENCY DEPARTMENT [place] CARDIAC EXAM "Study: Limited Ultrasound of the Heart and Pericardium"  INDICATIONS:Dyspnea Multiple views of the heart and pericardium were obtained in real-time with a multi-frequency probe.  PERFORMED JO:ITGPQD IMAGES ARCHIVED?: Yes LIMITATIONS:  Body habitus VIEWS USED: Parasternal long axis, Parasternal short axis and Apical 4 chamber  INTERPRETATION: Cardiac activity present, Pericardial effusion present, Cardiac tamponade absent and Decreased contractility   (including critical care time)   CRITICAL CARE Performed by: Grayce Sessions Cardama Total critical care time: 30 minutes Critical care time was exclusive of separately billable procedures and treating other patients. Critical care was necessary to treat or  prevent imminent or life-threatening deterioration. Critical care was time spent personally by me on the following activities: development of treatment plan with patient and/or surrogate as well as nursing, discussions with consultants, evaluation of patient's response to treatment, examination of patient, obtaining history from patient or surrogate, ordering and performing treatments and interventions, ordering and review of laboratory studies, ordering and review of radiographic studies, pulse oximetry and re-evaluation of patient's condition.    Medical Decision Making / ED Course I have reviewed the nursing notes for this encounter and the patient's prior records (if available in EHR or on provided paperwork).    Presentation is consistent with CHF exacerbation with worsening ADLs and increased peripheral edema.  Bedside echocardiogram revealed evidence of worsening contractility and pericardial effusion.  No evidence of Tamponade.  No need for emergent dialysis at this time.  However patient will require dialysis during admission.  Given IV Lasix.  Admitted to medicine for continued management.  Final Clinical Impression(s) / ED Diagnoses Final diagnoses:  Acute on chronic combined systolic and diastolic congestive heart failure (HCC)  Pericardial effusion      This chart was dictated using voice recognition software.  Despite best efforts to proofread,  errors can occur which can change the documentation meaning.   Fatima Blank, MD 03/22/18 [place], [place], MD 04/05/18 [phone]

## 2018-03-22 NOTE — Progress Notes (Signed)
Pt is refusing his evening lab draw. Lab tech states he will return at a later time and attempt again. MD notified. No new orders.

## 2018-03-22 NOTE — Consult Note (Signed)
Referring Provider: No ref. provider found Primary Care Physician:  Jilda Panda, MD Primary Nephrologist:  Dr. Lorrene Reid  Reason for Consultation:   Stage V chronic kidney disease, anasarca, anemia and metabolic acidosis  HPI: This is a very pleasant 67 year old gentleman who is a history of COPD congestive heart failure with diastolic dysfunction chronic kidney disease stage V with placement of left upper extremity AV fistula, TIA, CVA 02/01/2018, blind in left eye, obstructive sleep apnea, hypertension hyperlipidemia type 2 diabetes.  He was brought to the emergency room with increasing lower extremity edema and shortness of breath.  His dyspnea occurs on minimal exertion not associated with cough wheeze or hemoptysis there is no fever sweats or chills.  He had an AV fistula placed in 2016 never started on dialysis is being followed very closely by Dr. Lorrene Reid.  He has a blood pressure that is controlled O2 sats of 100% room air he has urine output and a weight of 125 kg.  Serum sodium 135 potassium 5.0 CO2 16 glucose 139 BUN 99 creatinine 9.26 calcium 8.5 albumin 3.7 GFR 5 cc/min.  Hemoglobin 8.6.  Chest x-ray showed left-sided pleural effusion.  Past Medical History:  Diagnosis Date  . Chronic kidney disease   . COPD (chronic obstructive pulmonary disease) ([place])   . Diabetes mellitus without complication ([place])   . Diabetic retinopathy ([place])   . Hyperlipidemia   . Hypertension   . Obesity   . Orthostatic hypotension   . OSA on CPAP   . Retinal detachment   . Seizures ([place])   . Stroke ([place])   . TIA (transient ischemic attack)   . Umbilical hernia     Past Surgical History:  Procedure Laterality Date  . BASCILIC VEIN TRANSPOSITION Left 03/06/2015   Procedure: LET BASCILIC VEIN TRANSPOSITION;  Surgeon: Angelia Mould, MD;  Location: [facility];  Service: Vascular;  Laterality: Left;  . CIRCUMCISION    . EYE SURGERY Bilateral    had surgery several different times.  Marland Kitchen HAMMER TOE SURGERY  Left 30 years ago   small toe left toe  . SEPTOPLASTY  20 years ago    Prior to Admission medications   Medication Sig Start Date End Date Taking? Authorizing Provider  Ascorbic Acid (VITAMIN C) 1000 MG tablet Take 1,000 mg by mouth daily.    [provider]  aspirin 81 MG tablet Take 162 mg by mouth daily.    [provider]  atorvastatin (LIPITOR) 40 MG tablet Take 40 mg by mouth daily.    [provider]  calcitRIOL (ROCALTROL) 0.25 MCG capsule Take 0.25 mcg by mouth daily.    [provider]  Cholecalciferol (VITAMIN D3) 5000 UNITS TABS Take 5,000 Units by mouth daily.    [provider]  folic acid (FOLVITE) 1 MG tablet Take 2 tablets (2 mg total) by mouth daily. 04/03/14   Josue Hector, MD  furosemide (LASIX) 80 MG tablet Take 80 mg by mouth daily.  01/25/14   [provider]  Garlic 6803 MG CAPS Take 1,000 mg by mouth daily.    [provider]  hydrALAZINE (APRESOLINE) 50 MG tablet Take 50 mg by mouth every 8 (eight) hours.     [provider]  insulin glargine (LANTUS) 100 UNIT/ML injection Inject 12 Units into the skin at bedtime.     [provider]  linagliptin (TRADJENTA) 5 MG TABS tablet Take 5 mg by mouth daily as needed.     [provider]  nebivolol (BYSTOLIC) 5 MG tablet Take 10 mg by mouth daily at 6 PM.     [provider]  Omega-3 Fatty Acids (FISH OIL) 500 MG CAPS Take 500 mg by mouth daily.    [provider]  [facility] VERIO test strip  01/08/14   [provider]  OVER THE COUNTER MEDICATION Take 1 capsule by mouth daily. Renafood    [provider]  oxyCODONE-acetaminophen (ROXICET) 5-325 MG per tablet Take 1-2 tablets by mouth every 4 (four) hours as needed for severe pain. 03/06/15   Angelia Mould, MD  prednisoLONE acetate (PRED FORTE) 1 % ophthalmic suspension Place 1 drop into the left eye 2 (two) times daily.     [provider]    Current Facility-Administered Medications  Medication Dose Route Frequency Provider Last Rate Last Dose  . acetaminophen (TYLENOL) tablet 650 mg  650 mg Oral Q6H PRN Santos-Sanchez, Merlene Morse, MD       Or  . acetaminophen (TYLENOL) suppository 650 mg  650 mg Rectal Q6H PRN Santos-Sanchez, Idalys, MD      . amLODipine (NORVASC) tablet 10 mg  10 mg Oral Daily Welford Roche, MD   10 mg at 03/22/18 0902  . aspirin chewable tablet 81 mg  81 mg Oral Daily Welford Roche, MD   81 mg at 03/22/18 0901  . calcitRIOL (ROCALTROL) capsule 0.25 mcg  0.25 mcg Oral Daily Welford Roche, MD   0.25 mcg at 03/22/18 0901  . carvedilol (COREG) tablet 25 mg  25 mg Oral BID WC Santos-Sanchez, Idalys, MD   25 mg at 03/22/18 0901  . Chlorhexidine Gluconate Cloth 2 % PADS 6 each  6 each Topical Q0600 Edrick Oh, MD      . Chlorhexidine Gluconate Cloth 2 % PADS 6 each  6 each Topical Q0600 Edrick Oh, MD      . Chlorhexidine Gluconate Cloth 2 % PADS 6 each  6 each Topical Q0600 Edrick Oh, MD      . Chlorhexidine Gluconate Cloth 2 % PADS 6 each  6 each Topical Q0600 Edrick Oh, MD      . cholecalciferol (VITAMIN D) tablet 2,000 Units  2,000 Units Oral Daily Welford Roche, MD   2,000 Units at 03/22/18 0901  . ezetimibe (ZETIA) tablet 10 mg  10 mg Oral Daily Welford Roche, MD   10 mg at 03/22/18 0902  . finasteride (PROSCAR) tablet 5 mg  5 mg Oral Daily Welford Roche, MD   5 mg at 03/22/18 0901  . folic acid (FOLVITE) tablet 2 mg  2 mg Oral Daily Welford Roche, MD   2 mg at 03/22/18 0902  . furosemide (LASIX) 120 mg in dextrose 5 % 50 mL IVPB  120 mg Intravenous BID Santos-Sanchez, Merlene Morse, MD      . heparin injection 5,000 Units  5,000 Units Subcutaneous Q8H Welford Roche, MD   5,000 Units at 03/22/18 0552  . hydrALAZINE (APRESOLINE) tablet 25 mg  25 mg Oral Q8H Santos-Sanchez, Idalys, MD   25 mg at 03/22/18 0552  . insulin aspart (novoLOG)  injection 0-20 Units  0-20 Units Subcutaneous TID WC Santos-Sanchez, Idalys, MD      . insulin glargine (LANTUS) injection 7 Units  7 Units Subcutaneous QHS Santos-Sanchez, Idalys, MD      . niacin tablet 200 mg  200 mg Oral QHS Santos-Sanchez, Idalys, MD      . patiromer (VELTASSA) packet 8.4 g  8.4 g Oral Daily Welford Roche, MD      .  polyethylene glycol (MIRALAX / GLYCOLAX) packet 17 g  17 g Oral Daily PRN Santos-Sanchez, Idalys, MD      . prednisoLONE acetate (PRED FORTE) 1 % ophthalmic suspension 1 drop  1 drop Left Eye BID Welford Roche, MD   1 drop at 03/22/18 0903  . sodium bicarbonate tablet 650 mg  650 mg Oral BID Welford Roche, MD   650 mg at 03/22/18 0910  . sodium chloride flush (NS) 0.9 % injection 3 mL  3 mL Intravenous Q12H Welford Roche, MD   3 mL at 03/22/18 0951    Allergies as of 03/22/2018 - Review Complete 03/22/2018  Allergen Reaction Noted  . Bee venom Swelling 03/22/2018  . Lexiscan [regadenoson] Other (See Comments) 06/25/2014  . Ace inhibitors Cough 02/17/2014    Family History  Problem Relation Age of Onset  . Hypertension Mother   . Diabetes type II Mother   . Transient ischemic attack Mother   . Kidney disease Mother   . Diabetes Mother   . Varicose Veins Mother   . Hypertension Father   . Diabetes type II Father   . Diabetes Father   . Diabetes type II Sister   . Hypertension Sister   . Diabetes Sister   . Learning disabilities Maternal Uncle     Social History   Socioeconomic History  . Marital status: Married    Spouse name: Not on file  . Number of children: Not on file  . Years of education: Not on file  . Highest education level: Not on file  Occupational History  . Not on file  Social Needs  . Financial resource strain: Not on file  . Food insecurity:    Worry: Not on file    Inability: Not on file  . Transportation needs:    Medical: Not on file    Non-medical: Not on file  Tobacco Use  .  Smoking status: Former Smoker    Types: Cigarettes    Last attempt to quit: 03/05/1974    Years since quitting: 44.0  . Smokeless tobacco: Never Used  Substance and Sexual Activity  . Alcohol use: No    Alcohol/week: 0.0 standard drinks  . Drug use: No  . Sexual activity: Never  Lifestyle  . Physical activity:    Days per week: Not on file    Minutes per session: Not on file  . Stress: Not on file  Relationships  . Social connections:    Talks on phone: Not on file    Gets together: Not on file    Attends religious service: Not on file    Active member of club or organization: Not on file    Attends meetings of clubs or organizations: Not on file    Relationship status: Not on file  . Intimate partner violence:    Fear of current or ex partner: Not on file    Emotionally abused: Not on file    Physically abused: Not on file    Forced sexual activity: Not on file  Other Topics Concern  . Not on file  Social History Narrative  . Not on file    Review of Systems: Gen: No fever sweats chills positive for weakness fatigue and malaise admits to weight gain with massive lower extremity edema HEENT: Left eye blindness. CV: Chest discomfort on exertion no palpitations no syncope two-pillow orthopnea massive lower extremity edema 3+. Resp: Dyspnea with no cough sputum no wheezing no hemoptysis GI: Nausea with no vomiting no  diarrhea GU : Denies urinary burning, blood in urine, urinary frequency, urinary hesitancy, nocturnal urination, and urinary incontinence.  No renal calculi. MS: Denies joint pain, limitation of movement, and swelling, stiffness, low back pain, extremity pain.  Admits to muscle weakness no cramps cramps, atrophy.  No use of non steroidal antiinflammatory drugs.  AV fistula palpable left forearm Derm: Denies rash, itching, dry skin, hives, moles, warts, or unhealing ulcers.  Psych: Denies depression, anxiety, memory loss, suicidal ideation, hallucinations, paranoia,  and confusion. Heme: Denies bruising, bleeding, and enlarged lymph nodes. Neuro: No headache.  No diplopia. No dysarthria.  No dysphasia.  No history of CVA.  No Seizures. No paresthesias.  No weakness. Endocrine  DM.  No Thyroid disease.  No Adrenal disease.  Physical Exam: Vital signs in last 24 hours: Temp:  [97.7 F (36.5 C)-98.2 F (36.8 C)] 97.7 F (36.5 C) (09/25 0907) Pulse Rate:  [69-72] 69 (09/25 0907) Resp:  [11-20] 20 (09/25 0907) BP: (136-190)/(70-98) 136/70 (09/25 0907) SpO2:  [96 %-100 %] 100 % (09/25 0907) Weight:  [99.3 kg-124.5 kg] 124.5 kg (09/25 0550) Last BM Date: 03/22/18 General:   Alert,  Well-developed, well-nourished, pleasant and cooperative in NAD Head:  Normocephalic and atraumatic. Eyes:  Sclera clear, no icterus.   Conjunctiva pink. Ears:  Normal auditory acuity. Nose:  No deformity, discharge,  or lesions. Mouth:  No deformity or lesions, dentition normal. Neck:  Supple; no masses or thyromegaly. JVP not elevated Lungs: Diminished at bases crackles heard left base Heart:  Regular rate and rhythm; no murmurs, clicks, rubs,  or gallops. Abdomen:  Soft, nontender and nondistended. No masses, hepatosplenomegaly or hernias noted. Normal bowel sounds, without guarding, and without rebound.   Msk:  Symmetrical without gross deformities. Normal posture. Pulses:  No carotid, renal, femoral bruits. DP and PT symmetrical and equal Extremities: Massive edema bilateral lower extremities Neurologic:  Alert and  oriented x4;  grossly normal neurologically. Skin:  Intact without significant lesions or rashes. Cervical Nodes:  No significant cervical adenopathy. Psych:  Alert and cooperative. Normal mood and affect.  Intake/Output from previous day: 09/24 0701 - 09/25 0700 In: 200 [P.O.:200] Out: 100 [Urine:100] Intake/Output this shift: No intake/output data recorded.  Lab Results: Recent Labs    03/22/18 0115  WBC 5.1  HGB 8.6*  HCT 27.1*  PLT 101*    BMET Recent Labs    03/22/18 0115  NA 135  K 5.0  CL 107  CO2 16*  GLUCOSE 139*  BUN 99*  CREATININE 9.26*  CALCIUM 8.5*   LFT Recent Labs    03/22/18 0115  PROT 7.3  ALBUMIN 3.7  AST 20  ALT 19  ALKPHOS 54  BILITOT 0.9  BILIDIR 0.2  IBILI 0.7   PT/INR No results for input(s): LABPROT, INR in the last 72 hours. Hepatitis Panel No results for input(s): HEPBSAG, HCVAB, HEPAIGM, HEPBIGM in the last 72 hours.  Studies/Results: Dg Chest 2 View  Result Date: 03/22/2018 CLINICAL DATA:  67 year old male with shortness of breath. EXAM: CHEST - 2 VIEW COMPARISON:  Chest radiograph dated 01/22/2018 FINDINGS: There is a small left pleural effusion with left lung base atelectasis. Infiltrate is not excluded. The right lung is clear. No pneumothorax. Stable cardiomegaly. Right pectoral pacemaker device. No acute osseous pathology. IMPRESSION: Small left pleural effusion and left lung base atelectasis versus infiltrate. Electronically Signed   By: Anner Crete M.D.   On: 03/22/2018 01:31    Assessment/Plan:  Chronic renal failure stage V, AV  fistula placed in 2016.  Appears to have chronic progressive renal insufficiency with now with a GFR that is very low at 5 cc/min.  Massive lower extremity edema metabolic acidosis and threatening hyperkalemia would be indications for initiation of dialysis.  Evaluation of fistula appears to be mature and ready for dialysis and will schedule dialysis.  Secondary hyperparathyroidism continue calcitriol will follow calciums  Anemia we will check iron studies and initiate Aranesp.  Hypertension/volume I think dialysis with aggressive volume removal would be indicated we will schedule daily dialysis for the next 4 days.  Congestive heart failure diastolic dysfunction continue to challenge start dry weight  Diabetes mellitus as per primary service    LOS: 0 Sherril Croon @TODAY @9 :53 AM

## 2018-03-22 NOTE — Progress Notes (Signed)
Pt refused CPAP for the night.   

## 2018-03-22 NOTE — ED Notes (Signed)
Patient transported to X-ray 

## 2018-03-22 NOTE — Progress Notes (Signed)
   Subjective: Gregory Hood reports nausea, but denies abdominal pain, chest pain, or shortness of breath. He quickly falls asleep during the interview and exam, but easily wakes up with stimulation. He understands that he will be starting dialysis today.  Objective:  Vital signs in last 24 hours: Vitals:   03/22/18 0445 03/22/18 0500 03/22/18 0550 03/22/18 0636  BP: (!) 163/90 (!) 167/87 (!) 172/91 (!) 190/90  Pulse: 69 70 72 69  Resp: 15 16    Temp:   98.2 F (36.8 C)   TempSrc:   Oral   SpO2: 100% 100% 100%   Weight:   124.5 kg   Height:   5\' 9"  (1.753 m)    Gen: Falls asleep, but easily arouses. Oriented to person and place (not year). CV: RRR. No murmurs, rubs, or gallops. CTAB: Clear to auscultation in the anterior lung fields. Posterior fields not auscultated given patient's tiredness. Abd: Soft, non-distended, non-tender. Ext: LUE AVF with palpable thrill. Markedly edematous bilateral lower extremities to the thighs Skin: Warm and dry. No rashes.  Bedside ECHO showed mild posterior pericardial effusion and left-sided pleural effusion.   Assessment/Plan:  Principal Problem:   Acute exacerbation of CHF (congestive heart failure) (HCC) Active Problems:   End stage renal disease (HCC)   Thrombocytopenia (HCC)   Normocytic anemia  Mr. Bartoszek is a 67yo male with a medical history of COPD, HFpEF, CKD stage V w/fistula (no HD), TIA, CVA (02/01/18), blind in left eye, OSA on CPAP, HTN, HLD, and TIIDM presenting with volume overload and uremia in the setting of CKD progression, necessitating initiation of dialysis.   Uremia and volume overload 2/2 ESRD - Patient has an AVF in the LUE that was placed in 2016, but has never been accessed. He has never had HD before. On admission: BUN 99 and Cr 9.26.  - Uremia and volume overload account for his chest pain, shortness of breath, nausea, disorientation, somnolence, pericardial effusion, and pleural effusion. EKG does not support  pericarditis. No pleural or pericardial rubs are heard on exam. - Anemia likely due to kidney disease.  - Patient received 120mg  IV lasix with only 100cc urinary output. His home diuretic regimen includes lasix 80mg  BID. Loop diuretics are not likely to relieve his volume overload given his level of renal dysfunction.  - Elevated troponin to 0.07 most likely due to demand ischemia in the setting of volume overload. Low suspicion for cardiac etiology given EKG without ischemic changes. - Patient evaluated by nephrology who plan to initiate dialysis and aranesp today.  Plan - Daily dialysis x4 days - F/u iron studies - F/u ECHO - Trend troponins - am renal function panel - tele - strict I/Os - daily weights  TIIDM:  - AIC 7.0 01/22/2017 Plan - lantus 7U qhs  - SSI  - CBG q4h   Dispo: Anticipated discharge in approximately 4-5 days. Transfer to [facility] tomorrow morning.  Dorrell, Andree Elk, MD 03/22/2018, 7:10 AM Pager: [phone]

## 2018-03-22 NOTE — H&P (Addendum)
Date: 03/22/2018               Patient Name:  Gregory Hood MRN: [id number]  DOB: [birth year] Age / Sex: 67 y.o., male   PCP: Jilda Panda, MD         Medical Service: Internal Medicine Teaching Service         Attending Physician: Dr. Evette Doffing, Mallie Mussel, *    First Contact: Dr. Koleen Distance  Pager: [phone]  Second Contact: Dr. Berline Lopes Pager: [phone]       After Hours (After 5p/  First Contact Pager: [phone]  weekends / holidays): Second Contact Pager: [phone]   Chief Complaint: chest pain   History of Present Illness:  Gregory Hood is a 67yo male with PMH of COPD (FEV1/FVC 84 with FEV1 50), HFpEF EF 55%, CKD w/LUE fistula (no HD), TIA, CVA (02/01/18), blind in left eye, OSA on CPAP, HTN, HLD, and TIIDM presenting with chest pressure and pain when he goes to sit up. He states he first noticed several days ago when he was still at SNF due to recent CVA. This has been continuing at home and occurs mainly when he uses his arms to push himself up. He did appear to have some mild confusion during history and was oriented to self and place but not time. He denies chest pain at rest or with ambulation. He states he has baseline SOB but does not feel like this has increased recently or becomes worse with exertion. He has noticed that he has had increased swelling of his feet. He states that he recently had a large change in medication on discharge from [facility] but is unsure of what medications were changed. He denies recent illness, sick contacts, nausea, vomiting, or changes in bowel movement.  He has a history of CKD stage IV for which a LUE fistula was placed in 2016 but HD was never needed. He sees Dr. Lorrene Reid with nephrology outpatient. He states that at some point nephrology attempted to access his fistula but had trouble with this. He was unsure when this was though. He states he continues to make urine and denies recent changes in urination.  He did express concern about a left  inguinal hernia for which he went to [facility] ED on 9/17 with successful reduction at bedside by the surgical team. He has continued to have pain at the hernia site.  He was advised to follow up outpatient.    Meds:  Carvedilol 25 mg BID  Amlodipine 10 mg QD  Hydralazine 25 mg TID  Aspirin 81 mg QD  Lasix 80 mg BID  Zetia 10 mg QD  Niacin 200 mg QD  Calcitriol 0.25 mcg QD  Vitamin D3 2000 mg QD  Sodium bicarbonate 350 mg BID  Finasteride 5 mg QD  Patiromer 8.4 g QD  Lantus 12 units QHS  Tradjenta 5 mg QD PRN?? Folic acid 2 mg QD  Prednisolone eye drops    Allergies: Allergies as of 03/22/2018 - Review Complete 03/22/2018  Allergen Reaction Noted  . Bee venom Swelling 03/22/2018  . Lexiscan [regadenoson] Other (See Comments) 06/25/2014  . Ace inhibitors Cough 02/17/2014   Past Medical History:  Diagnosis Date  . Chronic kidney disease   . COPD (chronic obstructive pulmonary disease) ([place])   . Diabetes mellitus without complication ([place])   . Diabetic retinopathy ([place])   . Hyperlipidemia   . Hypertension   . Obesity   . Orthostatic hypotension   .  OSA on CPAP   . Retinal detachment   . Seizures ([place])   . Stroke ([place])   . TIA (transient ischemic attack)   . Umbilical hernia     Family History:  Mother: HTN, TIIDM, TIA, Kidney disease Father: HTN, TIIDM Sister: TIIDM  Social History:  Lives at home with his wife in [place] and works as an Medical illustrator.  He quit smoking in 1975, denies alcohol use or recreational drug use.   Review of Systems: A complete ROS was negative except as per HPI.   Physical Exam: Blood pressure (!) 172/91, pulse 72, temperature 98.2 F (36.8 C), temperature source Oral, resp. rate 16, height 5\' 9"  (1.753 m), weight 124.5 kg, SpO2 100 %.  Constitution: lying supine in bed, obese, somnolent but arousable HEENT: blind left eye with opaque pupil, Right pupil reactive to light, no scleral icterus Cardio: S3, systolic murmur over left  sternum, regular rate & rhythm, LUE AVF with palpable thrill Respiratory: decreased breath sounds LLL, otherwise clear to auscultation, no w/r/r Abdominal: +bs, NTTP, non-distended, soft MSK: +2 edema extending up entire LE bilaterally, decreased pedal pulses, +radial pulses Neuro: oriented to self and place, cooperative Skin: c/d/i    BNP: 4007   Bedside US by EDP: pericardial effusion increased from previous ECHO at [facility] which showed mild pericardial effusion per ED physician.   EKG: personally reviewed my interpretation is NSR with low voltage.   CXR: personally reviewed my interpretation is cardiomegaly and small left pleural effusion.    ECHO 7/29: EF 50%, Grade II diastolic dysfunction, mild mitral regurge, moderate tricuspid regurge   Assessment & Plan by Problem: Principal Problem:   Acute exacerbation of CHF (congestive heart failure) (HCC) Active Problems:   End stage renal disease (HCC)   Thrombocytopenia (HCC)   Normocytic anemia  67yo male with PMH of COPD, HFpEF, CKD w/fistula (no HD), TIA, CVA (02/01/18), blind in left eye, OSA on CPAP, HTN, HLD, and TIIDM presenting with chest pressure and pain when he goes to sit up.   Acute on chronic HF exacerbation: He denies SOB but has pitting edema extending into his thighs. His symptoms are may be exacerbated by increasing pericardial effusion and newly decrease EF on bedside US per EDP. Mild effusion was seen on ECHO on 7/29 with EF 50% and Grade II diastolic dysfunction. He has had recent changes in medication but is unsure what changes have occurred as his medications are managed by his wife. Dry weight is unclear, he states is 219, but he is 218 today and is anasarcic on exam. Most recent recorded weight 220 lbs during recent admission on 01/2018.   - lasix 120 mg IV - trend troponins  - ECHO - cont. Coreg 25mg  bid, amlodipine 10 mg qd,hydralazine 25mg  tid - admit to telemetry  - daily weights - strict  I/O's  ESRD: Previous to this CKD Stage IV. He is volume overloaded but states he is still making urine. He is mildly uremic on exam. No urgent need for HD.  - consult nephrology  - afternoon RFP, Mg - cont. Sodium bicarb 650 mg and patiromer    Thrombocytopenia: unclear etiology, no history of thrombocytopenia. This is possibly dilutional as the rest of his CBC is decreased as well and he has anasarca on exam.   - blood smear ordered  - monitor, am CBC   Left Inguinal Hernia: He went to [facility] on 9/17 due to his hernia pain, and the hernia was successfully reduced  in the ED.His hernia has been bothering him more recently and was TTP but did not protrude at rest or with valsalva. With no changes in BM or abdominal pain I do not believe it is incarcerated. I would recommend possible surgical consultation or outpatient follow-up as it does seem to be causing him pain.   TIIDM:   - SSI  - lantus 7U qhs - CBG q4h  OSA: on home CPAP.   - CPAP ordered.   History of CVA: cont. Asa 81 mg  BPH: cont. Home finasteride   Diet: renal/carb diet with fluid restriction 2L  VTE: heparin IVF: None Code: Full, will need to confirm with wife who is HCPOA  Dispo: Admit patient to Inpatient with expected length of stay greater than 2 midnights.  SignedMarty Heck, DO 03/22/2018, 6:30 AM Pager: [phone]

## 2018-03-23 ENCOUNTER — Inpatient Hospital Stay (HOSPITAL_COMMUNITY): Payer: Medicare Other

## 2018-03-23 ENCOUNTER — Other Ambulatory Visit (HOSPITAL_COMMUNITY): Payer: Medicare Other

## 2018-03-23 DIAGNOSIS — I361 Nonrheumatic tricuspid (valve) insufficiency: Secondary | ICD-10-CM

## 2018-03-23 LAB — RENAL FUNCTION PANEL
ALBUMIN: 3 g/dL — AB (ref 3.5–5.0)
ANION GAP: 9 (ref 5–15)
BUN: 84 mg/dL — ABNORMAL HIGH (ref 8–23)
CALCIUM: 8.5 mg/dL — AB (ref 8.9–10.3)
CO2: 20 mmol/L — ABNORMAL LOW (ref 22–32)
Chloride: 106 mmol/L (ref 98–111)
Creatinine, Ser: 8.36 mg/dL — ABNORMAL HIGH (ref 0.61–1.24)
GFR calc Af Amer: 7 mL/min — ABNORMAL LOW (ref >60)
GFR calc non Af Amer: 6 mL/min — ABNORMAL LOW (ref >60)
GLUCOSE: 113 mg/dL — AB (ref 70–99)
Phosphorus: 5.8 mg/dL — ABNORMAL HIGH (ref 2.5–4.6)
Potassium: 4.5 mmol/L (ref 3.5–5.1)
Sodium: 135 mmol/L (ref 135–145)

## 2018-03-23 LAB — PATHOLOGIST SMEAR REVIEW

## 2018-03-23 LAB — VITAMIN B12: VITAMIN B 12: 468 pg/mL (ref 180–914)

## 2018-03-23 LAB — CBC
HCT: 26 % — ABNORMAL LOW (ref 39.0–52.0)
HEMOGLOBIN: 8.2 g/dL — AB (ref 13.0–17.0)
MCH: 29.5 pg (ref 26.0–34.0)
MCHC: 31.5 g/dL (ref 30.0–36.0)
MCV: 93.5 fL (ref 78.0–100.0)
Platelets: 103 10*3/uL — ABNORMAL LOW (ref 150–400)
RBC: 2.78 MIL/uL — ABNORMAL LOW (ref 4.22–5.81)
RDW: 14.3 % (ref 11.5–15.5)
WBC: 4.8 10*3/uL (ref 4.0–10.5)

## 2018-03-23 LAB — IRON AND TIBC
Iron: 34 ug/dL — ABNORMAL LOW (ref 45–182)
Saturation Ratios: 12 % — ABNORMAL LOW (ref 17.9–39.5)
TIBC: 274 ug/dL (ref 250–450)
UIBC: 240 ug/dL

## 2018-03-23 LAB — HEPATITIS B CORE ANTIBODY, TOTAL: Hep B Core Total Ab: NEGATIVE

## 2018-03-23 LAB — GLUCOSE, CAPILLARY
GLUCOSE-CAPILLARY: 93 mg/dL (ref 70–99)
GLUCOSE-CAPILLARY: 140 mg/dL — AB (ref 70–99)
Glucose-Capillary: 166 mg/dL — ABNORMAL HIGH (ref 70–99)
Glucose-Capillary: 167 mg/dL — ABNORMAL HIGH (ref 70–99)

## 2018-03-23 LAB — ECHOCARDIOGRAM COMPLETE
Height: 69 in
WEIGHTICAEL: 4201.09 [oz_av]

## 2018-03-23 LAB — HEPATITIS B SURFACE ANTIBODY,QUALITATIVE: HEP B S AB: NONREACTIVE

## 2018-03-23 LAB — FOLATE: Folate: 30 ng/mL (ref >5.9)

## 2018-03-23 MED ORDER — CALCITRIOL 0.25 MCG PO CAPS
ORAL_CAPSULE | ORAL | Status: AC
  Administered 2018-03-23: 0.25 ug
  Filled 2018-03-23: qty 1

## 2018-03-23 MED ORDER — HEPARIN SODIUM (PORCINE) 1000 UNIT/ML DIALYSIS: 1000.0000 [IU] | INTRAMUSCULAR | Status: DC | PRN

## 2018-03-23 MED ORDER — LIDOCAINE-PRILOCAINE 2.5-2.5 % EX CREA: 1.0000 "application " | TOPICAL_CREAM | CUTANEOUS | Status: DC | PRN

## 2018-03-23 MED ORDER — ALTEPLASE 2 MG IJ SOLR: 2.0000 mg | Freq: Once | INTRAMUSCULAR | Status: DC | PRN

## 2018-03-23 MED ORDER — LIDOCAINE HCL (PF) 1 % IJ SOLN: 5.0000 mL | INTRAMUSCULAR | Status: DC | PRN

## 2018-03-23 MED ORDER — SODIUM CHLORIDE 0.9 % IV SOLN: 100.0000 mL | INTRAVENOUS | Status: DC | PRN

## 2018-03-23 MED ORDER — DARBEPOETIN ALFA 100 MCG/0.5ML IJ SOSY
100.0000 ug | PREFILLED_SYRINGE | INTRAMUSCULAR | Status: DC
  Administered 2018-03-24: 100 ug via INTRAVENOUS
  Filled 2018-03-23: qty 0.5

## 2018-03-23 MED ORDER — PENTAFLUOROPROP-TETRAFLUOROETH EX AERO: 1.0000 "application " | INHALATION_SPRAY | CUTANEOUS | Status: DC | PRN

## 2018-03-23 MED ORDER — CHLORHEXIDINE GLUCONATE CLOTH 2 % EX PADS: 6.0000 | MEDICATED_PAD | Freq: Every day | CUTANEOUS | Status: DC

## 2018-03-23 MED ORDER — INSULIN ASPART 100 UNIT/ML ~~LOC~~ SOLN: 0.0000 [IU] | Freq: Every day | SUBCUTANEOUS | Status: DC

## 2018-03-23 MED ORDER — INSULIN ASPART 100 UNIT/ML ~~LOC~~ SOLN: 0.0000 [IU] | Freq: Three times a day (TID) | SUBCUTANEOUS | Status: DC

## 2018-03-23 NOTE — Plan of Care (Signed)
Nutrition Education Note  RD consulted for Renal Education. Pt is new to HD as of this admission.   Last Hgb A1c: 7.4 (02/17/14). PTA DM medications are 12 units insulin glargine q HS and 5 mg tradjent daily.   Labs reviewed: CBGS: 93-140 (inpatient orders for glycemic control are7 units insulin glargine daily and 0-20 units insulin aspart TID with meals).   Pt with flat affect at time of visit. He reports "There's so much I don't know. If I knew how to eat healthy, I wouldn't be in this situation". Allowed pt to verbalize feelings regarding hospitalization and transition to HD. He expressed fear of his own family, who have poor eating habits, and their potential to transition to HD in the future.   Pt shares that his wife does all of the cooking for his household. Offered to come back at a later time when wife was present, but pt reports that he is unsure when she will return to the hospital. Also offered to call his wife, but pt politely declined. Pt reports following a low salt diet at home- "the only salt I eat is what is already in the food". Pt consumes 2-3 meals per day. Meals consist of broccoli and cheese and baked fish.   Spent about 40 minutes counseling pt on diet and lifestyle changes and actively listening to pt's concerns. Pt initially had a flat affect, but became more conversant throughout interview. Focus of education was on sodium, potassium, and phosphorus restrictions. Helped pt identify renal-friendly favorite foods.   Case discussed with RN; made her aware of pt's understandably difficulty processing new life transition.   Provided "[place] for Patients with Kidney Disease" to patient/family. Reviewed food groups and provided written recommended serving sizes specifically determined for patient's current nutritional status.   Explained why diet restrictions are needed and provided lists of foods to limit/avoid that are high potassium, sodium, and phosphorus.  Provided specific recommendations on safer alternatives of these foods. Strongly encouraged compliance of this diet.   Discussed importance of protein intake at each meal and snack. Provided examples of how to maximize protein intake throughout the day. Discussed need for fluid restriction with dialysis, importance of minimizing weight gain between HD treatments, and renal-friendly beverage options.  Encouraged pt to discuss specific diet questions/concerns with RD at HD outpatient facility. Teach back method used.  Expect fair to good compliance.  Body mass index is 38.77 kg/m. Pt meets criteria for obesity, class II based on current BMI.  Current diet order is renal/ carb modified with 2 L fluid restriction, patient is consuming approximately 100% of meals at this time. Labs and medications reviewed. No further nutrition interventions warranted at this time. RD contact information provided. If additional nutrition issues arise, please re-consult RD.  Chanz Cahall A. Jimmye Norman, RD, LDN, CDE Pager: [phone] After hours Pager: [phone]

## 2018-03-23 NOTE — Progress Notes (Signed)
  Echocardiogram 2D Echocardiogram has been performed.  Gregory Hood M 03/23/2018, 2:55 PM

## 2018-03-23 NOTE — Progress Notes (Signed)
Patient had a period of confusion, easily reoriented.

## 2018-03-23 NOTE — Progress Notes (Signed)
[facility] ROUNDING NOTE   Subjective:   Appears to be doing well this morning no complaints seen on dialysis appears to be tolerating it well.  Blood pressure 121/56 O2 sats 100% room air afebrile temperature 98.4. Labs sodium 135 potassium 4.5 chloride 106 CO2 is 20 BUN 84 creatinine 8.35 calcium 8.5 phosphorus 5.8 albumin 3.0 hemoglobin 8.2 WBCs 4.8 platelets 103    Objective:  Vital signs in last 24 hours:  Temp:  [97.6 F (36.4 C)-98.5 F (36.9 C)] 98.4 F (36.9 C) (09/26 1106) Pulse Rate:  [68-85] 70 (09/26 1106) Resp:  [16-20] 16 (09/26 1106) BP: (121-169)/(56-97) 121/56 (09/26 1106) SpO2:  [98 %-100 %] 100 % (09/26 1106) Weight:  [121.5 kg-125.3 kg] 122.1 kg (09/26 0740)  Weight change: 26 kg Filed Weights   03/22/18 1359 03/23/18 0555 03/23/18 0740  Weight: 121.5 kg 121.5 kg 122.1 kg    Intake/Output: I/O last 3 completed shifts: In: 445.3 [P.O.:440; IV Piggyback:5.3] Out: 6295 [Urine:1700; MWUXL:2440]   Intake/Output this shift:  No intake/output data recorded.  No rashes Alert and oriented CVS- RRR no murmurs rubs gallops JVP not elevated RS- CTA diminished breath sounds at bases ABD- BS present soft non-distended EXT- no edema Skin no lesions or rashes   Basic Metabolic Panel: Recent Labs  Lab 03/22/18 0115 03/22/18 1216 03/22/18 1931 03/23/18 0523  NA 135 135  --  135  K 5.0 5.0  --  4.5  CL 107 108  --  106  CO2 16* 17*  --  20*  GLUCOSE 139* 185*  --  113*  BUN 99* 99*  --  84*  CREATININE 9.26* 9.42*  --  8.36*  CALCIUM 8.5* 8.5*  --  8.5*  MG  --   --  2.2  --   PHOS  --  6.2*  --  5.8*    Liver Function Tests: Recent Labs  Lab 03/22/18 0115 03/22/18 1216 03/23/18 0523  AST 20  --   --   ALT 19  --   --   ALKPHOS 54  --   --   BILITOT 0.9  --   --   PROT 7.3  --   --   ALBUMIN 3.7 2.8* 3.0*   No results for input(s): LIPASE, AMYLASE in the last 168 hours. No results for input(s): AMMONIA in the last 168  hours.  CBC: Recent Labs  Lab 03/22/18 0115 03/22/18 1931 03/23/18 0523  WBC 5.1 5.0 4.8  HGB 8.6* 8.7* 8.2*  HCT 27.1* 27.1* 26.0*  MCV 94.4 93.8 93.5  PLT 101* 109* 103*    Cardiac Enzymes: Recent Labs  Lab 03/22/18 0655 03/22/18 0947 03/22/18 1931  TROPONINI 0.07* 0.07* 0.07*    BNP: Invalid input(s): POCBNP  CBG: Recent Labs  Lab 03/22/18 1422 03/22/18 1455 03/22/18 1625 03/22/18 2119 03/23/18 0938  GLUCAP 127* 127* 169* 141* 93    Microbiology: No results found for this or any previous visit.  Coagulation Studies: No results for input(s): LABPROT, INR in the last 72 hours.  Urinalysis: No results for input(s): COLORURINE, LABSPEC, PHURINE, GLUCOSEU, HGBUR, BILIRUBINUR, KETONESUR, PROTEINUR, UROBILINOGEN, NITRITE, LEUKOCYTESUR in the last 72 hours.  Invalid input(s): APPERANCEUR    Imaging: Dg Chest 2 View  Result Date: 03/22/2018 CLINICAL DATA:  67 year old male with shortness of breath. EXAM: CHEST - 2 VIEW COMPARISON:  Chest radiograph dated 01/22/2018 FINDINGS: There is a small left pleural effusion with left lung base atelectasis. Infiltrate is not excluded. The right lung  is clear. No pneumothorax. Stable cardiomegaly. Right pectoral pacemaker device. No acute osseous pathology. IMPRESSION: Small left pleural effusion and left lung base atelectasis versus infiltrate. Electronically Signed   By: Anner Crete M.D.   On: 03/22/2018 01:31     Medications:    . amLODipine  10 mg Oral Daily  . aspirin  81 mg Oral Daily  . calcitRIOL  0.25 mcg Oral Daily  . carvedilol  25 mg Oral BID WC  . Chlorhexidine Gluconate Cloth  6 each Topical Q0600  . Chlorhexidine Gluconate Cloth  6 each Topical Q0600  . Chlorhexidine Gluconate Cloth  6 each Topical Q0600  . Chlorhexidine Gluconate Cloth  6 each Topical Q0600  . cholecalciferol  2,000 Units Oral Daily  . ezetimibe  10 mg Oral Daily  . finasteride  5 mg Oral Daily  . folic acid  2 mg Oral Daily   . heparin  5,000 Units Subcutaneous Q8H  . hydrALAZINE  25 mg Oral Q8H  . insulin aspart  0-20 Units Subcutaneous TID WC  . insulin glargine  7 Units Subcutaneous QHS  . niacin  200 mg Oral QHS  . patiromer  8.4 g Oral Daily  . prednisoLONE acetate  1 drop Left Eye BID  . sodium bicarbonate  650 mg Oral BID  . sodium chloride flush  3 mL Intravenous Q12H   acetaminophen **OR** acetaminophen, polyethylene glycol  Assessment/ Plan:   Chronic renal insufficiency stage V AV fistula placed 2016 he continues to do well with dialysis at low needle gauge 17-gauge needles.  AV fistula appears to be doing well.  This is dialysis treatment #2.  Dialysis initiated 03/22/2018.  CL IP process in place  Secondary hyperparathyroidism continue calcitriol  Anemia start on Aranesp check iron studies  Hypertension/volume appears to be doing well with ultrafiltration on dialysis.  Metabolic acidosis we will discontinue sodium bicarbonate now patient is on dialysis  Congestive heart failure with diastolic dysfunction  Diabetes mellitus as per primary service   LOS: [place] @TODAY @11 :22 AM

## 2018-03-23 NOTE — Plan of Care (Signed)
  Problem: Clinical Measurements: Goal: Ability to maintain clinical measurements within normal limits will improve Outcome: Progressing Goal: Will remain free from infection Outcome: Progressing   Problem: Activity: Goal: Risk for activity intolerance will decrease Outcome: Progressing   Problem: Nutrition: Goal: Adequate nutrition will be maintained Outcome: Progressing   Problem: Coping: Goal: Level of anxiety will decrease Outcome: Not Progressing

## 2018-03-23 NOTE — Plan of Care (Signed)
  Problem: Education: Goal: Knowledge of General Education information will improve Description Including pain rating scale, medication(s)/side effects and non-pharmacologic comfort measures Outcome: Progressing   Problem: Health Behavior/Discharge Planning: Goal: Ability to manage health-related needs will improve Outcome: Progressing   

## 2018-03-23 NOTE — Progress Notes (Signed)
Pt refusing cpap for the night. RT will continue to monitor as needed. 

## 2018-03-23 NOTE — Progress Notes (Signed)
PROGRESS NOTE    Gregory Hood  BJY:[id number] DOB: Jun 09, 1951 DOA: 03/22/2018 PCP: Jilda Panda, MD   Brief Narrative: 67 year old man with hx of COPD, HFpEF, CKD, TIA, CVA, OSA on CPAP and several other medical problems admitted with large volume overload and progression of acute on chronic kidney disease. He c/o chest pressure on presentation, but denied this to me today.  He's had progressive LE edema and SOB.  Nephrology has been consulted and has started HD.   Assessment & Plan:   Principal Problem:   Acute exacerbation of CHF (congestive heart failure) (HCC) Active Problems:   Elevated troponin   End stage renal disease (HCC)   Thrombocytopenia (HCC)   Normocytic anemia   Metabolic acidosis   Uremia   ESRD with volume overload  Metabolic Acidosis:  - appreciate nephrology c/s, planning for dialysis x4 days (#2 today) - Bicarb d/c'd as pt on dialysis now, will also d/c patirromer with initiation of dialysis  HFpEF: Concern for decreased EF and pericardial effusion on bedside US by ED provider.  Repeat echo currently pending. Echo 01/2015 with normal EF, grade 2 diastolic dysfunction - follow repeat echo - troponins flat - volume per renal with dialysis - cont. Coreg 25mg  bid, amlodipine 10 mg qd, hydralazine 25mg  tid - admit to telemetry  - daily weights - strict I/O's   Normocytic Anemia: likely 2/2 ESRD - B12, folate, iron panel - aranesp per renal  Thrombocytopenia: unclear etiology, no history of thrombocytopenia.  - blood smear ordered -> path review with normocytic anemia, mild thrombocytopenia - Negative hep B, follow HIV, hepatitis C, B12, folate - Continue to monitor  Left Inguinal Hernia: He went to [facility] on 9/17 due to his hernia pain, and the hernia was successfully reduced in the ED.  Will need continued outpatient follow up.  TIIDM: BG appropriate, follow - SSI  - lantus 7U qhs - CBG q4h  OSA: on home CPAP.  - CPAP ordered.   History of CVA:  cont. Asa 81 mg  BPH: cont. Home finasteride   DVT prophylaxis: heparin Code Status: full Family Communication: none at bedside Disposition Plan: pending improvement and inpatient dialysis per nephrology   Consultants:   Nephrology  Transfer from internal medicine  Procedures:   none  Antimicrobials:  Anti-infectives (From admission, onward)   None     Subjective: Denies any CP. Notes LE edema and worsening SOB recently.  Objective: Vitals:   03/22/18 2044 03/22/18 2153 03/23/18 0538 03/23/18 0555  BP: (!) 154/82 (!) 162/88 (!) 159/81   Pulse: 69 70 70   Resp: 18  18   Temp: 98.5 F (36.9 C)  98.4 F (36.9 C)   TempSrc: Oral  Oral   SpO2: 100%  100%   Weight:    121.5 kg  Height:        Intake/Output Summary (Last 24 hours) at 03/23/2018 0834 Last data filed at 03/23/2018 0556 Gross per 24 hour  Intake 245.3 ml  Output 4597 ml  Net -4351.7 ml   Filed Weights   03/22/18 1145 03/22/18 1359 03/23/18 0555  Weight: 125.3 kg 121.5 kg 121.5 kg    Examination:  General exam: Appears calm and comfortable  Respiratory system: Clear to auscultation. Respiratory effort normal. Cardiovascular system: S1 & S2 heard, RRR.  Gastrointestinal system: Abdomen is nondistended, soft and nontender. Central nervous system: Alert and oriented. No focal neurological deficits. Extremities: bilateral LE edema Skin: No rashes, lesions or ulcers Psychiatry: Judgement and  insight appear normal. Mood & affect appropriate.     Data Reviewed: I have personally reviewed following labs and imaging studies  CBC: Recent Labs  Lab 03/22/18 0115 03/22/18 1931 03/23/18 0523  WBC 5.1 5.0 4.8  HGB 8.6* 8.7* 8.2*  HCT 27.1* 27.1* 26.0*  MCV 94.4 93.8 93.5  PLT 101* 109* 811*   Basic Metabolic Panel: Recent Labs  Lab 03/22/18 0115 03/22/18 1216 03/22/18 1931 03/23/18 0523  NA 135 135  --  135  K 5.0 5.0  --  4.5  CL 107 108  --  106  CO2 16* 17*  --  20*  GLUCOSE 139*  185*  --  113*  BUN 99* 99*  --  84*  CREATININE 9.26* 9.42*  --  8.36*  CALCIUM 8.5* 8.5*  --  8.5*  MG  --   --  2.2  --   PHOS  --  6.2*  --  5.8*   GFR: Estimated Creatinine Clearance: 11.2 mL/min (Sofi Bryars) (by C-G formula based on SCr of 8.36 mg/dL (H)). Liver Function Tests: Recent Labs  Lab 03/22/18 0115 03/22/18 1216 03/23/18 0523  AST 20  --   --   ALT 19  --   --   ALKPHOS 54  --   --   BILITOT 0.9  --   --   PROT 7.3  --   --   ALBUMIN 3.7 2.8* 3.0*   No results for input(s): LIPASE, AMYLASE in the last 168 hours. No results for input(s): AMMONIA in the last 168 hours. Coagulation Profile: No results for input(s): INR, PROTIME in the last 168 hours. Cardiac Enzymes: Recent Labs  Lab 03/22/18 0655 03/22/18 0947 03/22/18 1931  TROPONINI 0.07* 0.07* 0.07*   BNP (last 3 results) No results for input(s): PROBNP in the last 8760 hours. HbA1C: No results for input(s): HGBA1C in the last 72 hours. CBG: Recent Labs  Lab 03/22/18 0807 03/22/18 1422 03/22/18 1455 03/22/18 1625 03/22/18 2119  GLUCAP 114* 127* 127* 169* 141*   Lipid Profile: No results for input(s): CHOL, HDL, LDLCALC, TRIG, CHOLHDL, LDLDIRECT in the last 72 hours. Thyroid Function Tests: No results for input(s): TSH, T4TOTAL, FREET4, T3FREE, THYROIDAB in the last 72 hours. Anemia Panel: No results for input(s): VITAMINB12, FOLATE, FERRITIN, TIBC, IRON, RETICCTPCT in the last 72 hours. Sepsis Labs: No results for input(s): PROCALCITON, LATICACIDVEN in the last 168 hours.  No results found for this or any previous visit (from the past 240 hour(s)).       Radiology Studies: Dg Chest 2 View  Result Date: 03/22/2018 CLINICAL DATA:  67 year old male with shortness of breath. EXAM: CHEST - 2 VIEW COMPARISON:  Chest radiograph dated 01/22/2018 FINDINGS: There is Margee Trentham small left pleural effusion with left lung base atelectasis. Infiltrate is not excluded. The right lung is clear. No pneumothorax. Stable  cardiomegaly. Right pectoral pacemaker device. No acute osseous pathology. IMPRESSION: Small left pleural effusion and left lung base atelectasis versus infiltrate. Electronically Signed   By: Anner Crete M.D.   On: 03/22/2018 01:31        Scheduled Meds: . amLODipine  10 mg Oral Daily  . aspirin  81 mg Oral Daily  . calcitRIOL  0.25 mcg Oral Daily  . carvedilol  25 mg Oral BID WC  . Chlorhexidine Gluconate Cloth  6 each Topical Q0600  . Chlorhexidine Gluconate Cloth  6 each Topical Q0600  . Chlorhexidine Gluconate Cloth  6 each Topical Q0600  . Chlorhexidine Gluconate  Cloth  6 each Topical [id number]  . cholecalciferol  2,000 Units Oral Daily  . ezetimibe  10 mg Oral Daily  . finasteride  5 mg Oral Daily  . folic acid  2 mg Oral Daily  . heparin  5,000 Units Subcutaneous Q8H  . hydrALAZINE  25 mg Oral Q8H  . insulin aspart  0-20 Units Subcutaneous TID WC  . insulin glargine  7 Units Subcutaneous QHS  . niacin  200 mg Oral QHS  . patiromer  8.4 g Oral Daily  . prednisoLONE acetate  1 drop Left Eye BID  . sodium bicarbonate  650 mg Oral BID  . sodium chloride flush  3 mL Intravenous Q12H   Continuous Infusions: . sodium chloride    . sodium chloride       LOS: 1 day    Time spent: over 30 min    Fayrene Helper, MD [facility] Pager [phone]  If 7PM-7AM, please contact night-coverage [email or web address] Password [facility] 03/23/2018, 8:34 AM

## 2018-03-24 LAB — RENAL FUNCTION PANEL
ALBUMIN: 2.8 g/dL — AB (ref 3.5–5.0)
Anion gap: 10 (ref 5–15)
BUN: 72 mg/dL — AB (ref 8–23)
CALCIUM: 8.5 mg/dL — AB (ref 8.9–10.3)
CO2: 21 mmol/L — ABNORMAL LOW (ref 22–32)
CREATININE: 7.3 mg/dL — AB (ref 0.61–1.24)
Chloride: 105 mmol/L (ref 98–111)
GFR calc Af Amer: 8 mL/min — ABNORMAL LOW (ref >60)
GFR calc non Af Amer: 7 mL/min — ABNORMAL LOW (ref >60)
GLUCOSE: 91 mg/dL (ref 70–99)
PHOSPHORUS: 5.2 mg/dL — AB (ref 2.5–4.6)
Potassium: 5 mmol/L (ref 3.5–5.1)
SODIUM: 136 mmol/L (ref 135–145)

## 2018-03-24 LAB — CBC
HCT: 24.8 % — ABNORMAL LOW (ref 39.0–52.0)
Hemoglobin: 8.1 g/dL — ABNORMAL LOW (ref 13.0–17.0)
MCH: 30.8 pg (ref 26.0–34.0)
MCHC: 32.7 g/dL (ref 30.0–36.0)
MCV: 94.3 fL (ref 78.0–100.0)
Platelets: 131 10*3/uL — ABNORMAL LOW (ref 150–400)
RBC: 2.63 MIL/uL — AB (ref 4.22–5.81)
RDW: 14.5 % (ref 11.5–15.5)
WBC: 5.1 10*3/uL (ref 4.0–10.5)

## 2018-03-24 LAB — HEPATITIS C ANTIBODY

## 2018-03-24 LAB — GLUCOSE, CAPILLARY
Glucose-Capillary: 123 mg/dL — ABNORMAL HIGH (ref 70–99)
Glucose-Capillary: 121 mg/dL — ABNORMAL HIGH (ref 70–99)
Glucose-Capillary: 186 mg/dL — ABNORMAL HIGH (ref 70–99)

## 2018-03-24 LAB — HIV ANTIBODY (ROUTINE TESTING W REFLEX): HIV SCREEN 4TH GENERATION: NONREACTIVE

## 2018-03-24 LAB — MAGNESIUM: Magnesium: 2.2 mg/dL (ref 1.7–2.4)

## 2018-03-24 MED ORDER — SODIUM CHLORIDE 0.9 % IV SOLN
125.0000 mg | INTRAVENOUS | Status: DC
  Administered 2018-03-27 – 2018-03-29 (×2): 125 mg via INTRAVENOUS
  Filled 2018-03-24 (×6): qty 10

## 2018-03-24 MED ORDER — DARBEPOETIN ALFA 100 MCG/0.5ML IJ SOSY
PREFILLED_SYRINGE | INTRAMUSCULAR | Status: AC
  Filled 2018-03-24: qty 0.5

## 2018-03-24 MED ORDER — SODIUM CHLORIDE 0.9 % IV SOLN: 100.0000 mL | INTRAVENOUS | Status: DC | PRN

## 2018-03-24 MED ORDER — PENTAFLUOROPROP-TETRAFLUOROETH EX AERO: 1.0000 "application " | INHALATION_SPRAY | CUTANEOUS | Status: DC | PRN

## 2018-03-24 NOTE — Progress Notes (Signed)
Pt refusing CPAP at this time.

## 2018-03-24 NOTE — Progress Notes (Signed)
[facility] ROUNDING NOTE   Subjective:   Appears to be doing well was seen on dialysis with no complaints.  Blood pressure 120/69 O2 sats 100% room air afebrile temperature 98.4.  Labs sodium 136 potassium 5.0 chloride 105 CO2 21 BUN 72 creatinine 7.3 calcium 8.5 phosphorus 5.2 albumin 2.8 magnesium 2.2 CBC revealed a white count of 5.1 hemoglobin 8.1 platelets of 131    Objective:  Vital signs in last 24 hours:  Temp:  [97.4 F (36.3 C)-98.6 F (37 C)] 98.4 F (36.9 C) (09/27 0700) Pulse Rate:  [68-72] 71 (09/27 0900) Resp:  [12-20] 12 (09/27 0700) BP: (120-156)/(56-86) 121/69 (09/27 0900) SpO2:  [97 %-100 %] 97 % (09/27 0627) Weight:  [118.1 kg-119.2 kg] 118.1 kg (09/27 0700)  Weight change: -3.2 kg Filed Weights   03/23/18 1018 03/24/18 0420 03/24/18 0700  Weight: 119.1 kg 119.2 kg 118.1 kg    Intake/Output: I/O last 3 completed shifts: In: 2130 [P.O.:1318] Out: 4825 [Urine:1825; Other:3000]   Intake/Output this shift:  No intake/output data recorded.  No rashes Alert and oriented CVS- RRR no murmurs rubs gallops JVP not elevated RS- CTA diminished breath sounds at bases ABD- BS present soft non-distended EXT- no edema Skin no lesions or rashes   Basic Metabolic Panel: Recent Labs  Lab 03/22/18 0115 03/22/18 1216 03/22/18 1931 03/23/18 0523 03/24/18 0534  NA 135 135  --  135 136  K 5.0 5.0  --  4.5 5.0  CL 107 108  --  106 105  CO2 16* 17*  --  20* 21*  GLUCOSE 139* 185*  --  113* 91  BUN 99* 99*  --  84* 72*  CREATININE 9.26* 9.42*  --  8.36* 7.30*  CALCIUM 8.5* 8.5*  --  8.5* 8.5*  MG  --   --  2.2  --  2.2  PHOS  --  6.2*  --  5.8* 5.2*    Liver Function Tests: Recent Labs  Lab 03/22/18 0115 03/22/18 1216 03/23/18 0523 03/24/18 0534  AST 20  --   --   --   ALT 19  --   --   --   ALKPHOS 54  --   --   --   BILITOT 0.9  --   --   --   PROT 7.3  --   --   --   ALBUMIN 3.7 2.8* 3.0* 2.8*   No results for input(s):  LIPASE, AMYLASE in the last 168 hours. No results for input(s): AMMONIA in the last 168 hours.  CBC: Recent Labs  Lab 03/22/18 0115 03/22/18 1931 03/23/18 0523 03/24/18 0534  WBC 5.1 5.0 4.8 5.1  HGB 8.6* 8.7* 8.2* 8.1*  HCT 27.1* 27.1* 26.0* 24.8*  MCV 94.4 93.8 93.5 94.3  PLT 101* 109* 103* 131*    Cardiac Enzymes: Recent Labs  Lab 03/22/18 0655 03/22/18 0947 03/22/18 1931  TROPONINI 0.07* 0.07* 0.07*    BNP: Invalid input(s): POCBNP  CBG: Recent Labs  Lab 03/22/18 2119 03/23/18 0938 03/23/18 1128 03/23/18 1556 03/23/18 2301  GLUCAP 141* 93 140* 166* 167*    Microbiology: No results found for this or any previous visit.  Coagulation Studies: No results for input(s): LABPROT, INR in the last 72 hours.  Urinalysis: No results for input(s): COLORURINE, LABSPEC, PHURINE, GLUCOSEU, HGBUR, BILIRUBINUR, KETONESUR, PROTEINUR, UROBILINOGEN, NITRITE, LEUKOCYTESUR in the last 72 hours.  Invalid input(s): APPERANCEUR    Imaging: No results found.   Medications:   . sodium chloride     .  amLODipine  10 mg Oral Daily  . aspirin  81 mg Oral Daily  . calcitRIOL  0.25 mcg Oral Daily  . carvedilol  25 mg Oral BID WC  . Chlorhexidine Gluconate Cloth  6 each Topical Q0600  . Chlorhexidine Gluconate Cloth  6 each Topical Q0600  . Chlorhexidine Gluconate Cloth  6 each Topical Q0600  . Chlorhexidine Gluconate Cloth  6 each Topical Q0600  . cholecalciferol  2,000 Units Oral Daily  . Darbepoetin Alfa      . darbepoetin (ARANESP) injection - DIALYSIS  100 mcg Intravenous Q Fri-HD  . ezetimibe  10 mg Oral Daily  . finasteride  5 mg Oral Daily  . folic acid  2 mg Oral Daily  . heparin  5,000 Units Subcutaneous Q8H  . hydrALAZINE  25 mg Oral Q8H  . insulin aspart  0-20 Units Subcutaneous TID WC  . insulin aspart  0-5 Units Subcutaneous QHS  . insulin glargine  7 Units Subcutaneous QHS  . niacin  200 mg Oral QHS  . prednisoLONE acetate  1 drop Left Eye BID  .  sodium chloride flush  3 mL Intravenous Q12H   sodium chloride, acetaminophen **OR** acetaminophen, pentafluoroprop-tetrafluoroeth, polyethylene glycol  Assessment/ Plan:   Chronic renal insufficiency stage V AV fistula placed 2016 he continues to do well with dialysis at low needle gauge 17-gauge needles.  AV fistula appears to be doing well.  This is dialysis treatment #3.  Dialysis initiated 03/22/2018.  CLIP process in place  Secondary hyperparathyroidism continue calcitriol  Anemia start on Aranesp iron saturations 12% start IV iron  Hypertension/volume appears to be doing well with ultrafiltration on dialysis.  Metabolic acidosis we will discontinue sodium bicarbonate now patient is on dialysis  Congestive heart failure with diastolic dysfunction  Diabetes mellitus as per primary service   LOS: 2 Sherril [place] @TODAY @9 :30 AM

## 2018-03-24 NOTE — Progress Notes (Addendum)
PROGRESS NOTE    Gregory Hood  NTZ:[id number] DOB: [birth year] DOA: 03/22/2018 PCP: Jilda Panda, MD   Brief Narrative: 67 year old man with hx of COPD, HFpEF, CKD, TIA, CVA, OSA on CPAP and several other medical problems admitted with large volume overload and progression of acute on chronic kidney disease. He c/o chest pressure on presentation, but denied this to me today.  He's had progressive LE edema and SOB.  Nephrology has been consulted and has started HD.   Assessment & Plan:   Principal Problem:   Acute exacerbation of CHF (congestive heart failure) (HCC) Active Problems:   Elevated troponin   End stage renal disease (HCC)   Thrombocytopenia (HCC)   Normocytic anemia   Metabolic acidosis   Uremia   ESRD with volume overload  Metabolic Acidosis:  - appreciate nephrology c/s, planning for dialysis x4 days (#3 today) - Bicarb d/c'd as pt on dialysis now, will also d/c patirromer with initiation of dialysis  HFpEF: Concern for decreased EF and pericardial effusion on bedside US by ED provider.  Repeat echo currently pending. Echo 01/2015 with normal EF, grade 2 diastolic dysfunction - follow repeat echo -> notable for concentric LVH, moderate pulm hypertension, mild pericardial effusion (see report) - recommending evaluation for cardiac amyloidosis  - troponins flat - volume per renal with dialysis - cont. Coreg 25mg  bid, amlodipine 10 mg qd, hydralazine 25mg  tid - admit to telemetry  - daily weights - strict I/O's - will need outpatient follow up for pulmonary hypertension and evaluation of cardiac amyloidosis   Normocytic Anemia: likely 2/2 ESRD - B12 (wnl), folate (wnl), iron panel (notable for iron deficiency) - aranesp per renal as well as IV iron  Thrombocytopenia: unclear etiology, no history of thrombocytopenia.  - blood smear ordered -> path review with normocytic anemia, mild thrombocytopenia - Negative hep B, follow HIV negative, hepatitis C negative, B12  wnl, folate wnl - Continue to monitor  Left Inguinal Hernia: He went to [facility] on 9/17 due to his hernia pain, and the hernia was successfully reduced in the ED.  Will need continued outpatient follow up.  TIIDM: BG appropriate, follow - SSI  - lantus 7U qhs  OSA: on home CPAP.  - CPAP ordered.   History of CVA: cont. Asa 81 mg  BPH: cont. Home finasteride   DVT prophylaxis: heparin Code Status: full Family Communication: none at bedside.  Discussed with wife over phone. Disposition Plan: pending improvement and inpatient dialysis per nephrology.  PT/OT evaluations pending.   Consultants:   Nephrology  Transfer from internal medicine  Procedures:  Study Conclusions  - Left ventricle: The cavity size was normal. There was moderate   concentric hypertrophy. Systolic function was normal. The   estimated ejection fraction was in the range of 55% to 60%. Wall   motion was normal; there were no regional wall motion   abnormalities. Features are consistent with Satoshi Kalas pseudonormal left   ventricular filling pattern, with concomitant abnormal relaxation   and increased filling pressure (grade 2 diastolic dysfunction).   Doppler parameters are consistent with elevated ventricular   end-diastolic filling pressure. - Mitral valve: Calcified annulus. Moderately thickened leaflets .   There was moderate regurgitation. - Left atrium: The atrium was moderately dilated. - Right atrium: The atrium was severely dilated. - Tricuspid valve: There was moderate regurgitation. - Pulmonary arteries: Systolic pressure was moderately increased.   PA peak pressure: 60 mm Hg (S). - Pericardium, extracardiac: Clearence Vitug mild circumferential pericardial  effusion was identified. There was Latrel Szymczak large left pleural effusion.  Impressions:  - There is moderate concentric LVH, thickening of the mitral and   aortic valves, bilatrial dilatation, mild peicardial and large   pleural effusion. Moderate mitral  and tricuspid regurgitation,   moderate pulmonary hypertension. Evaluation for cardiac   amyloidosis is recommended.  Antimicrobials:  Anti-infectives (From admission, onward)   None     Subjective: Feeling better. Thinks swelling may be better.  Objective: Vitals:   03/24/18 0900 03/24/18 0930 03/24/18 1000 03/24/18 1022  BP: 121/69 (!) 123/59 126/63 139/66  Pulse: 71 70 70 70  Resp:      Temp:      TempSrc:      SpO2:      Weight:      Height:        Intake/Output Summary (Last 24 hours) at 03/24/2018 1214 Last data filed at 03/24/2018 1119 Gross per 24 hour  Intake 1078 ml  Output 3425 ml  Net -2347 ml   Filed Weights   03/23/18 1018 03/24/18 0420 03/24/18 0700  Weight: 119.1 kg 119.2 kg 118.1 kg    Examination:  General: No acute distress. Cardiovascular: Heart sounds show Badr Piedra regular rate, and rhythm Lungs: Clear to auscultation bilaterally with good air movement.  Abdomen: Soft, nontender, nondistended Neurological: Alert and oriented 3. Moves all extremities 4. Cranial nerves II through XII grossly intact. Skin: Warm and dry. No rashes or lesions. Extremities: bilateral LE edema Psychiatric: Mood and affect are normal. Insight and judgment are appropriate.   Data Reviewed: I have personally reviewed following labs and imaging studies  CBC: Recent Labs  Lab 03/22/18 0115 03/22/18 1931 03/23/18 0523 03/24/18 0534  WBC 5.1 5.0 4.8 5.1  HGB 8.6* 8.7* 8.2* 8.1*  HCT 27.1* 27.1* 26.0* 24.8*  MCV 94.4 93.8 93.5 94.3  PLT 101* 109* 103* 478*   Basic Metabolic Panel: Recent Labs  Lab 03/22/18 0115 03/22/18 1216 03/22/18 1931 03/23/18 0523 03/24/18 0534  NA 135 135  --  135 136  K 5.0 5.0  --  4.5 5.0  CL 107 108  --  106 105  CO2 16* 17*  --  20* 21*  GLUCOSE 139* 185*  --  113* 91  BUN 99* 99*  --  84* 72*  CREATININE 9.26* 9.42*  --  8.36* 7.30*  CALCIUM 8.5* 8.5*  --  8.5* 8.5*  MG  --   --  2.2  --  2.2  PHOS  --  6.2*  --  5.8* 5.2*     GFR: Estimated Creatinine Clearance: 12.6 mL/min (Tedra Copp[place]oll) (by C-G formula based on SCr of 7.3 mg/dL (H)). Liver Function Tests: Recent Labs  Lab 03/22/18 0115 03/22/18 1216 03/23/18 0523 03/24/18 0534  AST 20  --   --   --   ALT 19  --   --   --   ALKPHOS 54  --   --   --   BILITOT 0.9  --   --   --   PROT 7.3  --   --   --   ALBUMIN 3.7 2.8* 3.0* 2.8*   No results for input(s): LIPASE, AMYLASE in the last 168 hours. No results for input(s): AMMONIA in the last 168 hours. Coagulation Profile: No results for input(s): INR, PROTIME in the last 168 hours. Cardiac Enzymes: Recent Labs  Lab 03/22/18 0655 03/22/18 0947 03/22/18 1931  TROPONINI 0.07* 0.07* 0.07*   BNP (last 3 results) No results  for input(s): PROBNP in the last 8760 hours. HbA1C: No results for input(s): HGBA1C in the last 72 hours. CBG: Recent Labs  Lab 03/23/18 0938 03/23/18 1128 03/23/18 1556 03/23/18 2301 03/24/18 1125  GLUCAP 93 140* 166* 167* 123*   Lipid Profile: No results for input(s): CHOL, HDL, LDLCALC, TRIG, CHOLHDL, LDLDIRECT in the last 72 hours. Thyroid Function Tests: No results for input(s): TSH, T4TOTAL, FREET4, T3FREE, THYROIDAB in the last 72 hours. Anemia Panel: Recent Labs    03/23/18 1112  VITAMINB12 468  FOLATE 30.0  TIBC 274  IRON 34*   Sepsis Labs: No results for input(s): PROCALCITON, LATICACIDVEN in the last 168 hours.  No results found for this or any previous visit (from the past 240 hour(s)).       Radiology Studies: No results found.      Scheduled Meds: . amLODipine  10 mg Oral Daily  . aspirin  81 mg Oral Daily  . calcitRIOL  0.25 mcg Oral Daily  . carvedilol  25 mg Oral BID WC  . Chlorhexidine Gluconate Cloth  6 each Topical Q0600  . Chlorhexidine Gluconate Cloth  6 each Topical Q0600  . Chlorhexidine Gluconate Cloth  6 each Topical Q0600  . Chlorhexidine Gluconate Cloth  6 each Topical Q0600  . cholecalciferol  2,000 Units Oral Daily  .  Darbepoetin Alfa      . darbepoetin (ARANESP) injection - DIALYSIS  100 mcg Intravenous Q Fri-HD  . ezetimibe  10 mg Oral Daily  . finasteride  5 mg Oral Daily  . folic acid  2 mg Oral Daily  . heparin  5,000 Units Subcutaneous Q8H  . hydrALAZINE  25 mg Oral Q8H  . insulin aspart  0-20 Units Subcutaneous TID WC  . insulin aspart  0-5 Units Subcutaneous QHS  . insulin glargine  7 Units Subcutaneous QHS  . niacin  200 mg Oral QHS  . prednisoLONE acetate  1 drop Left Eye BID  . sodium chloride flush  3 mL Intravenous Q12H   Continuous Infusions: . sodium chloride    . ferric gluconate (FERRLECIT/NULECIT) IV       LOS: 2 days    Time spent: over 30 min    Fayrene Helper, MD [facility] Pager [phone]  If 7PM-7AM, please contact night-coverage [email or web address] Password [facility] 03/24/2018, 12:14 PM

## 2018-03-24 NOTE — Evaluation (Signed)
Physical Therapy Evaluation Patient Details Name: Gregory Hood MRN: [id number] DOB: 12/13/1950 Today's Date: 03/24/2018   History of Present Illness  Pt is a 67 y/o male admitted secondary to worsening LE swelling and SOB. Likely secondary to acute CHF exacerbation. Pt also with progression of CKD and has started HD during stay. PMH includes COPD, CKD, CVA, OSA on CPAP, LUE AV fistula, and HTN.   Clinical Impression  Pt admitted secondary to problem above with deficits below. Pt with increased fatigue and only able to tolerate sitting at EOB. Required mod A for basic bed mobility tasks. Pt with slowed processing as well and required increased time to follow cues. Feel pt is at increased risk for falls and will need increased assist at home, so currently recommending SNF. Once swelling subsides and if mobility tolerance improves, may be able to progress to HHPT. Will continue to follow acutely to maximize functional mobility independence and safety.     Follow Up Recommendations SNF    Equipment Recommendations  None recommended by PT    Recommendations for Other Services       Precautions / Restrictions Precautions Precautions: Fall Restrictions Weight Bearing Restrictions: No      Mobility  Bed Mobility Overal bed mobility: Needs Assistance Bed Mobility: Supine to Sit;Sit to Supine     Supine to sit: Mod assist Sit to supine: Mod assist   General bed mobility comments: Mod A for trunk elevation. Pt demonstrating slowed response to verbal cues for sequencing. Asked pt to scoot hips towards EOB and pt laying straight back. Required cues to sit back up to perform task. Pt with increased fatigue, therefore, requesting to defer further mobility. Mod A for LE lift assist back to bed.  Transfers                    Ambulation/Gait                Stairs            Wheelchair Mobility    Modified Rankin (Stroke Patients Only)       Balance Overall  balance assessment: Needs assistance Sitting-balance support: No upper extremity supported;Feet supported Sitting balance-Leahy Scale: Fair                                       Pertinent Vitals/Pain Pain Assessment: Faces Faces Pain Scale: Hurts little more Pain Location: LLE Pain Descriptors / Indicators: Grimacing;Guarding Pain Intervention(s): Limited activity within patient's tolerance;Monitored during session;Repositioned    Home Living Family/patient expects to be discharged to:: Private residence Living Arrangements: Spouse/significant other Available Help at Discharge: Family Type of Home: House       Home Layout: One level Home Equipment: Environmental consultant - 2 wheels      Prior Function Level of Independence: Independent with assistive device(s)         Comments: Reporting he was independent, and then reported he sometimes used device. Very inconsistent.      Hand Dominance        Extremity/Trunk Assessment   Upper Extremity Assessment Upper Extremity Assessment: Defer to OT evaluation    Lower Extremity Assessment Lower Extremity Assessment: RLE deficits/detail;LLE deficits/detail RLE Deficits / Details: Noted increased swelling in RLE especially in lower legs and feet which limited ROM.  LLE Deficits / Details: Noted increased swelling in LLE especially in lower legs and feet which  limited ROM.     Cervical / Trunk Assessment Cervical / Trunk Assessment: Normal  Communication   Communication: No difficulties  Cognition Arousal/Alertness: Awake/alert Behavior During Therapy: WFL for tasks assessed/performed Overall Cognitive Status: No family/caregiver present to determine baseline cognitive functioning                                 General Comments: Pt somewhat slow to respond and demonstrated slowed sequencing. When asking pt to scoot hips forward, pt layed straight back and required cues to sit up to perform task.        General Comments General comments (skin integrity, edema, etc.): No family present during session.     Exercises     Assessment/Plan    PT Assessment Patient needs continued PT services  PT Problem List Decreased strength;Decreased balance;Decreased range of motion;Decreased mobility;Decreased activity tolerance;Decreased knowledge of use of DME;Decreased knowledge of precautions       PT Treatment Interventions DME instruction;Gait training;Stair training;Functional mobility training;Therapeutic activities;Therapeutic exercise;Balance training;Patient/family education    PT Goals (Current goals can be found in the Care Plan section)  Acute Rehab PT Goals Patient Stated Goal: none stated  PT Goal Formulation: With patient Time For Goal Achievement: 04/07/18 Potential to Achieve Goals: Fair    Frequency Min 2X/week   Barriers to discharge        Co-evaluation               AM-PAC PT "6 Clicks" Daily Activity  Outcome Measure Difficulty turning over in bed (including adjusting bedclothes, sheets and blankets)?: A Lot Difficulty moving from lying on back to sitting on the side of the bed? : Unable Difficulty sitting down on and standing up from a chair with arms (e.g., wheelchair, bedside commode, etc,.)?: Unable Help needed moving to and from a bed to chair (including a wheelchair)?: A Lot Help needed walking in hospital room?: A Lot Help needed climbing 3-5 steps with a railing? : Total 6 Click Score: 9    End of Session   Activity Tolerance: Patient limited by fatigue Patient left: in bed;with call bell/phone within reach;with bed alarm set Nurse Communication: Mobility status PT Visit Diagnosis: Unsteadiness on feet (R26.81);Muscle weakness (generalized) (M62.81);Difficulty in walking, not elsewhere classified (R26.2)    Time: 6808-8110 PT Time Calculation (min) (ACUTE ONLY): 17 min   Charges:   PT Evaluation $PT Eval Moderate Complexity: Justice, PT, DPT  Acute Rehabilitation Services  Pager: [phone] Office: [phone]   Rudean Hitt 03/24/2018, 3:10 PM

## 2018-03-24 NOTE — Plan of Care (Signed)
  Problem: Education: Goal: Knowledge of General Education information will improve Description Including pain rating scale, medication(s)/side effects and non-pharmacologic comfort measures Outcome: Progressing   Problem: Coping: Goal: Level of anxiety will decrease Outcome: Progressing   Problem: Skin Integrity: Goal: Risk for impaired skin integrity will decrease Outcome: Progressing   Problem: Education: Goal: Ability to demonstrate management of disease process will improve Outcome: Progressing

## 2018-03-24 NOTE — Plan of Care (Signed)
  Problem: Education: Goal: Knowledge of General Education information will improve Description: Including pain rating scale, medication(s)/side effects and non-pharmacologic comfort measures Outcome: Progressing   Problem: Health Behavior/Discharge Planning: Goal: Ability to manage health-related needs will improve Outcome: Progressing   Problem: Clinical Measurements: Goal: Will remain free from infection Outcome: Progressing   Problem: Clinical Measurements: Goal: Respiratory complications will improve Outcome: Progressing   Problem: Clinical Measurements: Goal: Cardiovascular complication will be avoided Outcome: Progressing   

## 2018-03-24 NOTE — Care Management Note (Signed)
Case Management Note  Patient Details  Name: Gregory Hood MRN: [id number] Date of Birth: 1951/01/20  Subjective/Objective:                 CHF   Action/Plan:  Patient from home with wife. He states that he does not want to go to SNF at DC. He states he wants to return home w [place]. He is active w [facility], verified with [facility].  Will need resumption orders.   Expected Discharge Date:                  Expected Discharge Plan:     In-House Referral:     Discharge planning Services  CM Consult  Post Acute Care Choice:    Choice offered to:     DME Arranged:    DME Agency:     HH Arranged:    [place] Agency:  [facility]  Status of Service:  In process, will continue to follow  If discussed at Long Length of Stay Meetings, dates discussed:    Additional Comments:  Carles Collet, RN 03/24/2018, 5:16 PM

## 2018-03-24 NOTE — Plan of Care (Signed)
  Problem: Education: Goal: Knowledge of General Education information will improve Description Including pain rating scale, medication(s)/side effects and non-pharmacologic comfort measures Outcome: Progressing   Problem: Health Behavior/Discharge Planning: Goal: Ability to manage health-related needs will improve Outcome: Progressing   Problem: Clinical Measurements: Goal: Ability to maintain clinical measurements within normal limits will improve Outcome: Progressing Goal: Will remain free from infection Outcome: Progressing Goal: Diagnostic test results will improve Outcome: Progressing Goal: Respiratory complications will improve Outcome: Progressing Goal: Cardiovascular complication will be avoided Outcome: Progressing   Problem: Clinical Measurements: Goal: Will remain free from infection Outcome: Progressing   Problem: Clinical Measurements: Goal: Diagnostic test results will improve Outcome: Progressing   Problem: Clinical Measurements: Goal: Respiratory complications will improve Outcome: Progressing   Problem: Clinical Measurements: Goal: Cardiovascular complication will be avoided Outcome: Progressing   Problem: Nutrition: Goal: Adequate nutrition will be maintained Outcome: Progressing   Problem: Coping: Goal: Level of anxiety will decrease Outcome: Progressing   Problem: Safety: Goal: Ability to remain free from injury will improve Outcome: Progressing   Problem: Skin Integrity: Goal: Risk for impaired skin integrity will decrease Outcome: Progressing   Problem: Education: Goal: Ability to demonstrate management of disease process will improve Outcome: Progressing Goal: Ability to verbalize understanding of medication therapies will improve Outcome: Progressing Goal: Individualized Educational Video(s) Outcome: Progressing   Problem: Activity: Goal: Capacity to carry out activities will improve Outcome: Progressing   Problem:  Education: Goal: Knowledge of disease and its progression will improve Outcome: Progressing Goal: Individualized Educational Video(s) Outcome: Progressing   Problem: Fluid Volume: Goal: Compliance with measures to maintain balanced fluid volume will improve Outcome: Progressing   Problem: Health Behavior/Discharge Planning: Goal: Ability to manage health-related needs will improve Outcome: Progressing   Problem: Health Behavior/Discharge Planning: Goal: Ability to manage health-related needs will improve Outcome: Progressing   Problem: Nutritional: Goal: Ability to make healthy dietary choices will improve Outcome: Progressing   Problem: Nutritional: Goal: Ability to make healthy dietary choices will improve Outcome: Progressing   Problem: Clinical Measurements: Goal: Complications related to the disease process, condition or treatment will be avoided or minimized Outcome: Progressing

## 2018-03-25 DIAGNOSIS — I10 Essential (primary) hypertension: Secondary | ICD-10-CM

## 2018-03-25 LAB — RENAL FUNCTION PANEL
ANION GAP: 8 (ref 5–15)
Albumin: 2.9 g/dL — ABNORMAL LOW (ref 3.5–5.0)
BUN: 51 mg/dL — ABNORMAL HIGH (ref 8–23)
CHLORIDE: 102 mmol/L (ref 98–111)
CO2: 25 mmol/L (ref 22–32)
Calcium: 8.4 mg/dL — ABNORMAL LOW (ref 8.9–10.3)
Creatinine, Ser: 6.14 mg/dL — ABNORMAL HIGH (ref 0.61–1.24)
GFR calc non Af Amer: 9 mL/min — ABNORMAL LOW (ref >60)
GFR, EST AFRICAN AMERICAN: 10 mL/min — AB (ref >60)
Glucose, Bld: 81 mg/dL (ref 70–99)
Phosphorus: 4.1 mg/dL (ref 2.5–4.6)
Potassium: 3.9 mmol/L (ref 3.5–5.1)
Sodium: 135 mmol/L (ref 135–145)

## 2018-03-25 LAB — CBC
HCT: 25.8 % — ABNORMAL LOW (ref 39.0–52.0)
HEMOGLOBIN: 8.2 g/dL — AB (ref 13.0–17.0)
MCH: 30 pg (ref 26.0–34.0)
MCHC: 31.8 g/dL (ref 30.0–36.0)
MCV: 94.5 fL (ref 78.0–100.0)
PLATELETS: 111 10*3/uL — AB (ref 150–400)
RBC: 2.73 MIL/uL — AB (ref 4.22–5.81)
RDW: 14.3 % (ref 11.5–15.5)
WBC: 4.6 10*3/uL (ref 4.0–10.5)

## 2018-03-25 LAB — GLUCOSE, CAPILLARY
GLUCOSE-CAPILLARY: 143 mg/dL — AB (ref 70–99)
GLUCOSE-CAPILLARY: 129 mg/dL — AB (ref 70–99)
Glucose-Capillary: 141 mg/dL — ABNORMAL HIGH (ref 70–99)

## 2018-03-25 LAB — MAGNESIUM: Magnesium: 2 mg/dL (ref 1.7–2.4)

## 2018-03-25 NOTE — Progress Notes (Signed)
Pt declined use of CPAP 

## 2018-03-25 NOTE — Progress Notes (Signed)
PROGRESS NOTE    Gregory Hood  PZW:[id number] DOB: 08-12-1950 DOA: 03/22/2018 PCP: Jilda Panda, MD   Brief Narrative: 67 year old man with hx of COPD, HFpEF, CKD, TIA, CVA, OSA on CPAP and several other medical problems admitted with large volume overload and progression of acute on chronic kidney disease. He c/o chest pressure on presentation, but denied this to me today.  He's had progressive LE edema and SOB.  Nephrology has been consulted and has started HD.   Assessment & Plan:   Principal Problem:   Acute exacerbation of CHF (congestive heart failure) (HCC) Active Problems:   Elevated troponin   End stage renal disease (HCC)   Thrombocytopenia (HCC)   Normocytic anemia   Metabolic acidosis   Uremia   ESRD with volume overload  Metabolic Acidosis:  -Nephrology continues to follow.  Patient is getting daily hemodialysis for 4 days.  Today will be day 4. - Bicarb d/c'd as pt on dialysis now, also d/c patiromer with initiation of dialysis. Waiting to be established with outpatient dialysis.  HFpEF, chronic:  Echo 01/2015 with normal EF, grade 2 diastolic dysfunction - repeat echo -> notable for concentric LVH, moderate pulm hypertension, mild pericardial effusion (see report) - recommending evaluation for cardiac amyloidosis  - troponins flat - volume per renal with dialysis - cont. Coreg 25mg  bid, amlodipine 10 mg qd, hydralazine 25mg  tid - daily weights - strict I/O's - will need outpatient follow up for pulmonary hypertension and evaluation of cardiac amyloidosis   Anemia secondary to chronic kidney disease - B12 (wnl), folate (wnl), iron panel (notable for iron deficiency) - aranesp per renal as well as IV iron  Thrombocytopenia: unclear etiology, no history of thrombocytopenia.  - blood smear ordered -> path review with normocytic anemia, mild thrombocytopenia - Negative hep B, follow HIV negative, hepatitis C negative, B12 wnl, folate wnl - Continue to monitor.   Counts are stable.  Left Inguinal Hernia: He went to [facility] on 9/17 due to his hernia pain, and the hernia was successfully reduced in the ED.  Will need continued outpatient follow up.  TIIDM: BG appropriate, follow - SSI  - lantus 7U qhs  OSA: on home CPAP.  - CPAP ordered.   History of CVA: cont. Asa 81 mg  BPH: cont. Home finasteride   DVT prophylaxis: heparin Code Status: full Family Communication: Discussed with patient. Disposition Plan: PT recommending skilled nursing facility placement.  Waiting for the patient to be established in the outpatient dialysis center.   Consultants:   Nephrology   Procedures:  Transthoracic echocardiogram Study Conclusions  - Left ventricle: The cavity size was normal. There was moderate   concentric hypertrophy. Systolic function was normal. The   estimated ejection fraction was in the range of 55% to 60%. Wall   motion was normal; there were no regional wall motion   abnormalities. Features are consistent with a pseudonormal left   ventricular filling pattern, with concomitant abnormal relaxation   and increased filling pressure (grade 2 diastolic dysfunction).   Doppler parameters are consistent with elevated ventricular   end-diastolic filling pressure. - Mitral valve: Calcified annulus. Moderately thickened leaflets .   There was moderate regurgitation. - Left atrium: The atrium was moderately dilated. - Right atrium: The atrium was severely dilated. - Tricuspid valve: There was moderate regurgitation. - Pulmonary arteries: Systolic pressure was moderately increased.   PA peak pressure: 60 mm Hg (S). - Pericardium, extracardiac: A mild circumferential pericardial   effusion was  identified. There was a large left pleural effusion.  Impressions:  - There is moderate concentric LVH, thickening of the mitral and   aortic valves, bilatrial dilatation, mild peicardial and large   pleural effusion. Moderate mitral and  tricuspid regurgitation,   moderate pulmonary hypertension. Evaluation for cardiac   amyloidosis is recommended.  Antimicrobials:  Anti-infectives (From admission, onward)   None     Subjective: Patient seen while he was at dialysis.  He denies any complaints.  Specifically denies any shortness of breath nausea or vomiting.  Objective: Vitals:   03/25/18 0800 03/25/18 0830 03/25/18 0900 03/25/18 0930  BP: 130/71 (!) 147/81 (!) 145/79 (!) 146/67  Pulse: 70 70 71 70  Resp: 17 18 18 18   Temp:      TempSrc:      SpO2:      Weight:      Height:        Intake/Output Summary (Last 24 hours) at 03/25/2018 1111 Last data filed at 03/25/2018 0053 Gross per 24 hour  Intake 963 ml  Output 301 ml  Net 662 ml   Filed Weights   03/24/18 0700 03/25/18 0441 03/25/18 0715  Weight: 118.1 kg 116.4 kg 116.4 kg    Examination:  General: Awake alert.  In no distress Cardiovascular: S1-S2 is normal regular.  No S3-S4.  No rubs murmurs or bruit Lungs: Clear to auscultation bilaterally.  Normal effort Abdomen: Abdomen soft.  Nontender nondistended. Neurological: Alert and oriented x3.  No obvious focal neurological deficits. Extremities: Does have significant bilateral lower extremity edema.    Data Reviewed: I have personally reviewed following labs and imaging studies  CBC: Recent Labs  Lab 03/22/18 0115 03/22/18 1931 03/23/18 0523 03/24/18 0534 03/25/18 0538  WBC 5.1 5.0 4.8 5.1 4.6  HGB 8.6* 8.7* 8.2* 8.1* 8.2*  HCT 27.1* 27.1* 26.0* 24.8* 25.8*  MCV 94.4 93.8 93.5 94.3 94.5  PLT 101* 109* 103* 131* 149*   Basic Metabolic Panel: Recent Labs  Lab 03/22/18 0115 03/22/18 1216 03/22/18 1931 03/23/18 0523 03/24/18 0534 03/25/18 0538  NA 135 135  --  135 136 135  K 5.0 5.0  --  4.5 5.0 3.9  CL 107 108  --  106 105 102  CO2 16* 17*  --  20* 21* 25  GLUCOSE 139* 185*  --  113* 91 81  BUN 99* 99*  --  84* 72* 51*  CREATININE 9.26* 9.42*  --  8.36* 7.30* 6.14*  CALCIUM  8.5* 8.5*  --  8.5* 8.5* 8.4*  MG  --   --  2.2  --  2.2 2.0  PHOS  --  6.2*  --  5.8* 5.2* 4.1   GFR: Estimated Creatinine Clearance: 14.9 mL/min (A) (by C-G formula based on SCr of 6.14 mg/dL (H)). Liver Function Tests: Recent Labs  Lab 03/22/18 0115 03/22/18 1216 03/23/18 0523 03/24/18 0534 03/25/18 0538  AST 20  --   --   --   --   ALT 19  --   --   --   --   ALKPHOS 54  --   --   --   --   BILITOT 0.9  --   --   --   --   PROT 7.3  --   --   --   --   ALBUMIN 3.7 2.8* 3.0* 2.8* 2.9*   Cardiac Enzymes: Recent Labs  Lab 03/22/18 0655 03/22/18 0947 03/22/18 1931  TROPONINI 0.07* 0.07* 0.07*  CBG: Recent Labs  Lab 03/23/18 1556 03/23/18 2301 03/24/18 1125 03/24/18 1658 03/24/18 2121  GLUCAP 166* 167* 123* 121* 186*   Anemia Panel: Recent Labs    03/23/18 1112  VITAMINB12 468  FOLATE 30.0  TIBC 274  IRON 34*    Radiology Studies: No results found.  Scheduled Meds: . amLODipine  10 mg Oral Daily  . aspirin  81 mg Oral Daily  . calcitRIOL  0.25 mcg Oral Daily  . carvedilol  25 mg Oral BID WC  . Chlorhexidine Gluconate Cloth  6 each Topical Q0600  . Chlorhexidine Gluconate Cloth  6 each Topical Q0600  . Chlorhexidine Gluconate Cloth  6 each Topical Q0600  . Chlorhexidine Gluconate Cloth  6 each Topical Q0600  . cholecalciferol  2,000 Units Oral Daily  . darbepoetin (ARANESP) injection - DIALYSIS  100 mcg Intravenous Q Fri-HD  . ezetimibe  10 mg Oral Daily  . finasteride  5 mg Oral Daily  . folic acid  2 mg Oral Daily  . heparin  5,000 Units Subcutaneous Q8H  . hydrALAZINE  25 mg Oral Q8H  . insulin aspart  0-20 Units Subcutaneous TID WC  . insulin aspart  0-5 Units Subcutaneous QHS  . insulin glargine  7 Units Subcutaneous QHS  . niacin  200 mg Oral QHS  . prednisoLONE acetate  1 drop Left Eye BID  . sodium chloride flush  3 mL Intravenous Q12H   Continuous Infusions: . sodium chloride    . ferric gluconate (FERRLECIT/NULECIT) IV        LOS: 3 days      Bonnielee Haff, MD [facility] Pager: On [email or web address]  If 7PM-7AM, please contact night-coverage [email or web address] Password [email or web address] 03/25/2018, 11:11 AM

## 2018-03-25 NOTE — Progress Notes (Signed)
[facility] ROUNDING NOTE   Subjective:   Patient has been seen on dialysis appears to be doing well this morning  Blood pressure 140/67 pulse of 70 temperature 97.5 O2 sats 100% room air weight 1 1 6  kg body mass index of 38  Labs sodium 135 potassium 3.9 chloride 102 CO2 25 BUN 51 creatinine 6.14 glucose 81 calcium 8.4 phosphorus 4.1 magnesium 2.0 albumin 2.9 hemoglobin 8.2 WBC 4.6 platelets 111    Objective:  Vital signs in last 24 hours:  Temp:  [97.5 F (36.4 C)-98.9 F (37.2 C)] 97.5 F (36.4 C) (09/28 0715) Pulse Rate:  [69-74] 70 (09/28 0930) Resp:  [16-19] 18 (09/28 0930) BP: (124-151)/(65-86) 146/67 (09/28 0930) SpO2:  [98 %-100 %] 99 % (09/28 0715) Weight:  [116.4 kg] 116.4 kg (09/28 0715)  Weight change: -5.662 kg Filed Weights   03/24/18 0700 03/25/18 0441 03/25/18 0715  Weight: 118.1 kg 116.4 kg 116.4 kg    Intake/Output: I/O last 3 completed shifts: In: 1324 [P.O.:1440; I.V.:3] Out: 3626 [Urine:625; Other:3000; Stool:1]   Intake/Output this shift:  No intake/output data recorded.  No rashes Alert and oriented CVS- RRR no murmurs rubs gallops JVP not elevated RS- CTA diminished breath sounds at bases ABD- BS present soft non-distended EXT- no edema Skin no lesions or rashes   Basic Metabolic Panel: Recent Labs  Lab 03/22/18 0115 03/22/18 1216 03/22/18 1931 03/23/18 0523 03/24/18 0534 03/25/18 0538  NA 135 135  --  135 136 135  K 5.0 5.0  --  4.5 5.0 3.9  CL 107 108  --  106 105 102  CO2 16* 17*  --  20* 21* 25  GLUCOSE 139* 185*  --  113* 91 81  BUN 99* 99*  --  84* 72* 51*  CREATININE 9.26* 9.42*  --  8.36* 7.30* 6.14*  CALCIUM 8.5* 8.5*  --  8.5* 8.5* 8.4*  MG  --   --  2.2  --  2.2 2.0  PHOS  --  6.2*  --  5.8* 5.2* 4.1    Liver Function Tests: Recent Labs  Lab 03/22/18 0115 03/22/18 1216 03/23/18 0523 03/24/18 0534 03/25/18 0538  AST 20  --   --   --   --   ALT 19  --   --   --   --   ALKPHOS 54  --   --    --   --   BILITOT 0.9  --   --   --   --   PROT 7.3  --   --   --   --   ALBUMIN 3.7 2.8* 3.0* 2.8* 2.9*   No results for input(s): LIPASE, AMYLASE in the last 168 hours. No results for input(s): AMMONIA in the last 168 hours.  CBC: Recent Labs  Lab 03/22/18 0115 03/22/18 1931 03/23/18 0523 03/24/18 0534 03/25/18 0538  WBC 5.1 5.0 4.8 5.1 4.6  HGB 8.6* 8.7* 8.2* 8.1* 8.2*  HCT 27.1* 27.1* 26.0* 24.8* 25.8*  MCV 94.4 93.8 93.5 94.3 94.5  PLT 101* 109* 103* 131* 111*    Cardiac Enzymes: Recent Labs  Lab 03/22/18 0655 03/22/18 0947 03/22/18 1931  TROPONINI 0.07* 0.07* 0.07*    BNP: Invalid input(s): POCBNP  CBG: Recent Labs  Lab 03/23/18 1556 03/23/18 2301 03/24/18 1125 03/24/18 1658 03/24/18 2121  GLUCAP 166* 167* 123* 121* 186*    Microbiology: No results found for this or any previous visit.  Coagulation Studies: No results for input(s): LABPROT,  INR in the last 72 hours.  Urinalysis: No results for input(s): COLORURINE, LABSPEC, PHURINE, GLUCOSEU, HGBUR, BILIRUBINUR, KETONESUR, PROTEINUR, UROBILINOGEN, NITRITE, LEUKOCYTESUR in the last 72 hours.  Invalid input(s): APPERANCEUR    Imaging: No results found.   Medications:   . sodium chloride    . ferric gluconate (FERRLECIT/NULECIT) IV     . amLODipine  10 mg Oral Daily  . aspirin  81 mg Oral Daily  . calcitRIOL  0.25 mcg Oral Daily  . carvedilol  25 mg Oral BID WC  . Chlorhexidine Gluconate Cloth  6 each Topical Q0600  . Chlorhexidine Gluconate Cloth  6 each Topical Q0600  . Chlorhexidine Gluconate Cloth  6 each Topical Q0600  . Chlorhexidine Gluconate Cloth  6 each Topical Q0600  . cholecalciferol  2,000 Units Oral Daily  . darbepoetin (ARANESP) injection - DIALYSIS  100 mcg Intravenous Q Fri-HD  . ezetimibe  10 mg Oral Daily  . finasteride  5 mg Oral Daily  . folic acid  2 mg Oral Daily  . heparin  5,000 Units Subcutaneous Q8H  . hydrALAZINE  25 mg Oral Q8H  . insulin aspart  0-20  Units Subcutaneous TID WC  . insulin aspart  0-5 Units Subcutaneous QHS  . insulin glargine  7 Units Subcutaneous QHS  . niacin  200 mg Oral QHS  . prednisoLONE acetate  1 drop Left Eye BID  . sodium chloride flush  3 mL Intravenous Q12H   sodium chloride, acetaminophen **OR** acetaminophen, pentafluoroprop-tetrafluoroeth, polyethylene glycol  Assessment/ Plan:   Chronic renal insufficiency stage V AV fistula placed 2016 he continues to do well with dialysis at low needle gauge 17-gauge needles.  AV fistula appears to be doing well.  This is dialysis treatment #4.  Dialysis initiated 03/22/2018.  CLIP process in place  Secondary hyperparathyroidism continue calcitriol also taking oral cholecalciferol 2000 units daily  Anemia start on Aranesp 03/24/2018 dose 100 mcg iron saturations 12% start IV iron continues on oral folic acid 2 mg daily  Hypertension/volume appears to be doing well with ultrafiltration on dialysis.  Amlodipine 10 mg daily, Coreg 25 mg twice daily, Proscar 5 mg daily  Metabolic acidosis we will discontinue sodium bicarbonate now patient is on dialysis  Congestive heart failure with diastolic dysfunction 2D echo seem to indicate possibilities of amyloidosis will need cardiac outpatient follow-up  Diabetes mellitus as per primary service continues on Lantus 7 units at bedtime  Hyperlipidemia niacin 200 mg at bedtime and Zetia 10 mg daily  Mild thrombocytopenia negative hepatitis studies.  Appears to be mild and not acutely dropping  left inguinal hernia outpatient follow-up reducible in the ER  Obstructive sleep apnea noncompliant with CPAP  History of CVA patient using aspirin 81 mg daily   LOS: 3 Sherril Croon @TODAY @10 :26 AM

## 2018-03-26 LAB — GLUCOSE, CAPILLARY
GLUCOSE-CAPILLARY: 160 mg/dL — AB (ref 70–99)
GLUCOSE-CAPILLARY: 151 mg/dL — AB (ref 70–99)
Glucose-Capillary: 92 mg/dL (ref 70–99)
Glucose-Capillary: 110 mg/dL — ABNORMAL HIGH (ref 70–99)
Glucose-Capillary: 133 mg/dL — ABNORMAL HIGH (ref 70–99)

## 2018-03-26 MED ORDER — CHLORHEXIDINE GLUCONATE CLOTH 2 % EX PADS: 6.0000 | MEDICATED_PAD | Freq: Every day | CUTANEOUS | Status: DC

## 2018-03-26 NOTE — Evaluation (Signed)
Occupational Therapy Evaluation Patient Details Name: Gregory Hood MRN: [id number] DOB: [birth year] Today's Date: 03/26/2018    History of Present Illness Pt is a 67 y/o male admitted secondary to worsening LE swelling and SOB. Likely secondary to acute CHF exacerbation. Pt also with progression of CKD and has started HD during stay. PMH includes COPD, CKD, CVA, OSA on CPAP, LUE AV fistula, and HTN.    Clinical Impression   This 67 yo male admitted with above presents to acute OT with decreased balance when up on his feet thus affecting safety and independence with basic ADLs. He is moving better and able to transfer today over PT eval on 03/24/2018. He will benefit from continued OT acutely and at SNF v. Home to increased mobility and safety as well as decreasing burden of care.    Follow Up Recommendations  SNF;Supervision/Assistance - 24 hour;Other (comment)(however if pt was getting alot of A at home pta for ADLs and wife can continue to do this, then [facility])    Equipment Recommendations  None recommended by OT       Precautions / Restrictions Precautions Precautions: Fall Restrictions Weight Bearing Restrictions: No      Mobility Bed Mobility Overal bed mobility: Needs Assistance Bed Mobility: Supine to Sit     Supine to sit: Supervision;HOB elevated        Transfers Overall transfer level: Needs assistance Equipment used: Rolling walker (2 wheeled) Transfers: Sit to/from [facility] Sit to Stand: Min assist;From elevated surface Stand pivot transfers: Min assist            Balance Overall balance assessment: Needs assistance Sitting-balance support: No upper extremity supported;Feet supported Sitting balance-Leahy Scale: Good     Standing balance support: Bilateral upper extremity supported Standing balance-Leahy Scale: Poor                             ADL either performed or assessed with clinical judgement   ADL Overall ADL's  : Needs assistance/impaired Eating/Feeding: Independent;Sitting   Grooming: Set up;Sitting;Supervision/safety   Upper Body Bathing: Set up;Sitting;Supervision/ safety   Lower Body Bathing: Moderate assistance Lower Body Bathing Details (indicate cue type and reason): min A for sit<>stand from raised bed Upper Body Dressing : Set up;Supervision/safety;Sitting   Lower Body Dressing: Maximal assistance Lower Body Dressing Details (indicate cue type and reason): min A for sit<>stand from raised bed Toilet Transfer: Minimal assistance;Stand-pivot;RW Toilet Transfer Details (indicate cue type and reason): raised be to recliner Toileting- Clothing Manipulation and Hygiene: Maximal assistance Toileting - Clothing Manipulation Details (indicate cue type and reason): min A for sit<>stand from raised bed             Vision Baseline Vision/History: (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)              Pertinent Vitals/Pain Pain Assessment: No/denies pain     Hand Dominance Right   Extremity/Trunk Assessment Upper Extremity Assessment Upper Extremity Assessment: Overall WFL for tasks assessed           Communication Communication Communication: No difficulties   Cognition Arousal/Alertness: Awake/alert Behavior During Therapy: WFL for tasks assessed/performed Overall Cognitive Status: No family/caregiver present to determine baseline cognitive functioning                                 General Comments: Pt reports he does remember coming  into hosiptal in not good shape and not understanding what was going on, but he feels better now and is moving better too.              Home Living Family/patient expects to be discharged to:: Private residence Living Arrangements: Spouse/significant other Available Help at Discharge: Family;Available 24 hours/day Type of Home: House Home Access: Ramped entrance     Home Layout: One level     Bathroom Shower/Tub:  Tub/shower unit;Curtain   Biochemist, clinical: Standard     Home Equipment: Environmental consultant - 2 wheels;Walker - 4 wheels;Shower seat;Hospital bed          Prior Functioning/Environment          Comments: Unsure of accuracy of pt report--says his wife dresses him, helps him toilet, and helps him in and out of tub        OT Problem List: Decreased range of motion;Impaired balance (sitting and/or standing);Obesity      OT Treatment/Interventions: Self-care/ADL training;Balance training;DME and/or AE instruction;Patient/family education    OT Goals(Current goals can be found in the care plan section) Acute Rehab OT Goals Patient Stated Goal: to go home OT Goal Formulation: With patient Time For Goal Achievement: 04/09/18 Potential to Achieve Goals: Good  OT Frequency: Min 2X/week              AM-PAC PT "6 Clicks" Daily Activity     Outcome Measure Help from another person eating meals?: None Help from another person taking care of personal grooming?: A Little Help from another person toileting, which includes using toliet, bedpan, or urinal?: A Lot Help from another person bathing (including washing, rinsing, drying)?: A Lot Help from another person to put on and taking off regular upper body clothing?: A Little Help from another person to put on and taking off regular lower body clothing?: A Lot 6 Click Score: 16   End of Session Equipment Utilized During Treatment: Gait belt;Rolling walker  Activity Tolerance: Patient tolerated treatment well Patient left: in chair;with call bell/phone within reach;with chair alarm set  OT Visit Diagnosis: Unsteadiness on feet (R26.81);Muscle weakness (generalized) (M62.81)                Time: 6389-3734 OT Time Calculation (min): 27 min Charges:  OT General Charges $OT Visit: 1 Visit OT Evaluation $OT Eval Moderate Complexity: 1 Mod OT Treatments $Self Care/Home Management : 8-22 mins  [address]en [address], OTR/L Acute [employer] Pager  [phone] Office [phone]

## 2018-03-26 NOTE — Progress Notes (Signed)
Pt and wife prefer [facility] in [place].   Fritz Pickerel, RN

## 2018-03-26 NOTE — Progress Notes (Signed)
[facility] ROUNDING NOTE   Subjective:   Patient has been seen on dialysis appears to be doing well this morning, he has no complaints  Blood pressure 156/80 pulse of 69 O2 sats 100% room air temperature 98.2  Sodium 135 potassium 3.9 chloride 102 CO2 25 BUN 51 creatinine 6.14 calcium 8.4 phosphorus 4.1 magnesium 2.0 albumin 2.9 WBC 4.6 hemoglobin 8.2 platelets 111   labs drawn 03/25/2018    Objective:  Vital signs in last 24 hours:  Temp:  [97.7 F (36.5 C)-98.9 F (37.2 C)] 98.2 F (36.8 C) (09/29 0519) Pulse Rate:  [68-71] 69 (09/29 0852) Resp:  [14-20] 14 (09/29 0519) BP: (126-156)/(64-80) 156/80 (09/29 0852) SpO2:  [95 %-100 %] 100 % (09/29 0519) Weight:  [112.4 kg-114.7 kg] 114.7 kg (09/29 0211)  Weight change: -0.038 kg Filed Weights   03/25/18 0715 03/25/18 1128 03/26/18 0211  Weight: 116.4 kg 112.4 kg 114.7 kg    Intake/Output: I/O last 3 completed shifts: In: 723 [P.O.:720; I.V.:3] Out: 4401 [Urine:400; Other:4000; Stool:1]   Intake/Output this shift:  Total I/O In: 240 [P.O.:240] Out: -   No rashes Alert and oriented CVS- RRR no murmurs rubs gallops JVP not elevated RS- CTA diminished breath sounds at bases ABD- BS present soft non-distended EXT- no edema Skin no lesions or rashes   Basic Metabolic Panel: Recent Labs  Lab 03/22/18 0115 03/22/18 1216 03/22/18 1931 03/23/18 0523 03/24/18 0534 03/25/18 0538  NA 135 135  --  135 136 135  K 5.0 5.0  --  4.5 5.0 3.9  CL 107 108  --  106 105 102  CO2 16* 17*  --  20* 21* 25  GLUCOSE 139* 185*  --  113* 91 81  BUN 99* 99*  --  84* 72* 51*  CREATININE 9.26* 9.42*  --  8.36* 7.30* 6.14*  CALCIUM 8.5* 8.5*  --  8.5* 8.5* 8.4*  MG  --   --  2.2  --  2.2 2.0  PHOS  --  6.2*  --  5.8* 5.2* 4.1    Liver Function Tests: Recent Labs  Lab 03/22/18 0115 03/22/18 1216 03/23/18 0523 03/24/18 0534 03/25/18 0538  AST 20  --   --   --   --   ALT 19  --   --   --   --   ALKPHOS 54  --    --   --   --   BILITOT 0.9  --   --   --   --   PROT 7.3  --   --   --   --   ALBUMIN 3.7 2.8* 3.0* 2.8* 2.9*   No results for input(s): LIPASE, AMYLASE in the last 168 hours. No results for input(s): AMMONIA in the last 168 hours.  CBC: Recent Labs  Lab 03/22/18 0115 03/22/18 1931 03/23/18 0523 03/24/18 0534 03/25/18 0538  WBC 5.1 5.0 4.8 5.1 4.6  HGB 8.6* 8.7* 8.2* 8.1* 8.2*  HCT 27.1* 27.1* 26.0* 24.8* 25.8*  MCV 94.4 93.8 93.5 94.3 94.5  PLT 101* 109* 103* 131* 111*    Cardiac Enzymes: Recent Labs  Lab 03/22/18 0655 03/22/18 0947 03/22/18 1931  TROPONINI 0.07* 0.07* 0.07*    BNP: Invalid input(s): POCBNP  CBG: Recent Labs  Lab 03/25/18 1231 03/25/18 1630 03/25/18 2102 03/26/18 0417 03/26/18 0840  GLUCAP 143* 141* 129* 160* 35    Microbiology: No results found for this or any previous visit.  Coagulation Studies: No results for input(s):  LABPROT, INR in the last 72 hours.  Urinalysis: No results for input(s): COLORURINE, LABSPEC, PHURINE, GLUCOSEU, HGBUR, BILIRUBINUR, KETONESUR, PROTEINUR, UROBILINOGEN, NITRITE, LEUKOCYTESUR in the last 72 hours.  Invalid input(s): APPERANCEUR    Imaging: No results found.   Medications:   . ferric gluconate (FERRLECIT/NULECIT) IV     . amLODipine  10 mg Oral Daily  . aspirin  81 mg Oral Daily  . calcitRIOL  0.25 mcg Oral Daily  . carvedilol  25 mg Oral BID WC  . Chlorhexidine Gluconate Cloth  6 each Topical Q0600  . Chlorhexidine Gluconate Cloth  6 each Topical Q0600  . Chlorhexidine Gluconate Cloth  6 each Topical Q0600  . Chlorhexidine Gluconate Cloth  6 each Topical Q0600  . cholecalciferol  2,000 Units Oral Daily  . darbepoetin (ARANESP) injection - DIALYSIS  100 mcg Intravenous Q Fri-HD  . ezetimibe  10 mg Oral Daily  . finasteride  5 mg Oral Daily  . folic acid  2 mg Oral Daily  . heparin  5,000 Units Subcutaneous Q8H  . hydrALAZINE  25 mg Oral Q8H  . insulin aspart  0-20 Units Subcutaneous  TID WC  . insulin aspart  0-5 Units Subcutaneous QHS  . insulin glargine  7 Units Subcutaneous QHS  . niacin  200 mg Oral QHS  . prednisoLONE acetate  1 drop Left Eye BID  . sodium chloride flush  3 mL Intravenous Q12H   acetaminophen **OR** acetaminophen, polyethylene glycol  Assessment/ Plan:   Chronic renal insufficiency stage V AV fistula placed 2016 he continues to do well with dialysis at low needle gauge 17-gauge needles.  AV fistula appears to be doing well.  Has undergone #4 dialysis treatments dialysis initiated 03/22/2018.  CLIP process in place plan dialysis 03/27/2018  Secondary hyperparathyroidism continue calcitriol also taking oral cholecalciferol 2000 units daily  Anemia start on Aranesp 03/24/2018 dose 100 mcg iron saturations 12% start IV iron continues on oral folic acid 2 mg daily  Hypertension/volume appears to be doing well with ultrafiltration on dialysis.  Amlodipine 10 mg daily, Coreg 25 mg twice daily, Proscar 5 mg daily, hydralazine 25 mg 3 times daily  Metabolic acidosis we will discontinue sodium bicarbonate now patient is on dialysis  Congestive heart failure with diastolic dysfunction 2D echo seem to indicate possibilities of amyloidosis will need cardiac outpatient follow-up  Diabetes mellitus as per primary service continues on Lantus 7 units at bedtime  Hyperlipidemia niacin 200 mg at bedtime and Zetia 10 mg daily  Mild thrombocytopenia negative hepatitis studies.  Appears to be mild and not acutely dropping  left inguinal hernia outpatient follow-up reducible in the ER  Obstructive sleep apnea noncompliant with CPAP  History of CVA patient using aspirin 81 mg daily   LOS: 4 Sherril Croon @TODAY @9 :51 AM

## 2018-03-26 NOTE — Progress Notes (Signed)
Pt refused use of CPAP.

## 2018-03-26 NOTE — Progress Notes (Signed)
PROGRESS NOTE    Gregory Hood  QAS:[id number] DOB: 1951-02-29 DOA: 03/22/2018 PCP: Jilda Panda, MD   Brief Narrative: 67 year old man with hx of COPD, HFpEF, CKD, TIA, CVA, OSA on CPAP and several other medical problems admitted with large volume overload and progression of acute on chronic kidney disease. He c/o chest pressure on presentation, but denied this to me today.  He's had progressive LE edema and SOB.  Nephrology has been consulted and has started HD.   Assessment & Plan:   Principal Problem:   Acute exacerbation of CHF (congestive heart failure) (HCC) Active Problems:   Elevated troponin   End stage renal disease (HCC)   Thrombocytopenia (HCC)   Normocytic anemia   Metabolic acidosis   Uremia   ESRD with volume overload  Metabolic Acidosis:  -Nephrology continues to follow.  Patient received daily hemodialysis for 4 days.  Last treatment was on 9/28.  Next dialysis is planned for 9/30. - Bicarb d/c'd as pt on dialysis now, also d/c patiromer with initiation of dialysis. Waiting to be established with outpatient dialysis.  HFpEF, chronic:  Echo 01/2015 with normal EF, grade 2 diastolic dysfunction.  Patient also with pleural effusion. - repeat echo -> notable for concentric LVH, moderate pulm hypertension, mild pericardial effusion (see report) - recommending evaluation for cardiac amyloidosis  - troponins flat - volume per renal with dialysis - cont. Coreg 25mg  bid, amlodipine 10 mg qd, hydralazine 25mg  tid - daily weights - strict I/O's - will need outpatient follow up for pulmonary hypertension and evaluation of cardiac amyloidosis   Anemia secondary to chronic kidney disease - B12 (wnl), folate (wnl), iron panel (notable for iron deficiency) - aranesp per renal as well as IV iron  Thrombocytopenia: unclear etiology, no history of thrombocytopenia.  - blood smear ordered -> path review with normocytic anemia, mild thrombocytopenia - Negative hep B, HIV  negative, hepatitis C negative, B12 wnl, folate wnl - Continue to monitor.  Counts are stable.  Left Inguinal Hernia:  He went to [facility] on 9/17 due to his hernia pain, and the hernia was successfully reduced in the ED.  Will need continued outpatient follow up.  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  Continue SSI and Lantus.    OSA: on home CPAP.  - CPAP ordered.   History of CVA:  Stable.  Asa 81 mg  BPH:  Stable.  Continue finasteride   DVT prophylaxis: heparin Code Status: full Family Communication: Discussed with patient. Disposition Plan: PT recommending skilled nursing facility placement.  Waiting for the patient to be established in the outpatient dialysis center.   Consultants:   Nephrology   Procedures:  Transthoracic echocardiogram Study Conclusions  - Left ventricle: The cavity size was normal. There was moderate   concentric hypertrophy. Systolic function was normal. The   estimated ejection fraction was in the range of 55% to 60%. Wall   motion was normal; there were no regional wall motion   abnormalities. Features are consistent with a pseudonormal left   ventricular filling pattern, with concomitant abnormal relaxation   and increased filling pressure (grade 2 diastolic dysfunction).   Doppler parameters are consistent with elevated ventricular   end-diastolic filling pressure. - Mitral valve: Calcified annulus. Moderately thickened leaflets .   There was moderate regurgitation. - Left atrium: The atrium was moderately dilated. - Right atrium: The atrium was severely dilated. - Tricuspid valve: There was moderate regurgitation. - Pulmonary arteries: Systolic pressure was moderately increased.   PA  peak pressure: 60 mm Hg (S). - Pericardium, extracardiac: A mild circumferential pericardial   effusion was identified. There was a large left pleural effusion.  Impressions:  - There is moderate concentric LVH, thickening of the mitral and   aortic valves,  bilatrial dilatation, mild peicardial and large   pleural effusion. Moderate mitral and tricuspid regurgitation,   moderate pulmonary hypertension. Evaluation for cardiac   amyloidosis is recommended.  Antimicrobials:  Anti-infectives (From admission, onward)   None     Subjective: Patient feels well.  Denies any complaints this morning.  Denies any shortness of breath or chest pain.    Objective: Vitals:   03/25/18 2153 03/26/18 0211 03/26/18 0519 03/26/18 0852  BP: 131/69  (!) 144/69 (!) 156/80  Pulse: 69  70 69  Resp: 14  14   Temp: 98.9 F (37.2 C)  98.2 F (36.8 C)   TempSrc: Oral  Oral   SpO2: 99%  100%   Weight:  114.7 kg    Height:        Intake/Output Summary (Last 24 hours) at 03/26/2018 1112 Last data filed at 03/26/2018 0852 Gross per 24 hour  Intake 960 ml  Output 4200 ml  Net -3240 ml   Filed Weights   03/25/18 0715 03/25/18 1128 03/26/18 0211  Weight: 116.4 kg 112.4 kg 114.7 kg    Examination:  General: Awake alert.  In no distress Cardiovascular: S1-S2 is normal regular.  No S3-S4.  No rubs murmurs or bruit Lungs: Normal effort.  Clear to auscultation bilaterally Abdomen: Abdomen soft.  Nontender nondistended Neurological: Alert and oriented x3.  No focal neurological deficits.  Seems to be distracted today. Extremities: Continues to have significant bilateral lower extremity edema. Slightly better compared to yesterday.    Data Reviewed: I have personally reviewed following labs and imaging studies  CBC: Recent Labs  Lab 03/22/18 0115 03/22/18 1931 03/23/18 0523 03/24/18 0534 03/25/18 0538  WBC 5.1 5.0 4.8 5.1 4.6  HGB 8.6* 8.7* 8.2* 8.1* 8.2*  HCT 27.1* 27.1* 26.0* 24.8* 25.8*  MCV 94.4 93.8 93.5 94.3 94.5  PLT 101* 109* 103* 131* 034*   Basic Metabolic Panel: Recent Labs  Lab 03/22/18 0115 03/22/18 1216 03/22/18 1931 03/23/18 0523 03/24/18 0534 03/25/18 0538  NA 135 135  --  135 136 135  K 5.0 5.0  --  4.5 5.0 3.9  CL 107  108  --  106 105 102  CO2 16* 17*  --  20* 21* 25  GLUCOSE 139* 185*  --  113* 91 81  BUN 99* 99*  --  84* 72* 51*  CREATININE 9.26* 9.42*  --  8.36* 7.30* 6.14*  CALCIUM 8.5* 8.5*  --  8.5* 8.5* 8.4*  MG  --   --  2.2  --  2.2 2.0  PHOS  --  6.2*  --  5.8* 5.2* 4.1   GFR: Estimated Creatinine Clearance: 14.8 mL/min (A) (by C-G formula based on SCr of 6.14 mg/dL (H)). Liver Function Tests: Recent Labs  Lab 03/22/18 0115 03/22/18 1216 03/23/18 0523 03/24/18 0534 03/25/18 0538  AST 20  --   --   --   --   ALT 19  --   --   --   --   ALKPHOS 54  --   --   --   --   BILITOT 0.9  --   --   --   --   PROT 7.3  --   --   --   --  ALBUMIN 3.7 2.8* 3.0* 2.8* 2.9*   Cardiac Enzymes: Recent Labs  Lab 03/22/18 0655 03/22/18 0947 03/22/18 1931  TROPONINI 0.07* 0.07* 0.07*   CBG: Recent Labs  Lab 03/25/18 1231 03/25/18 1630 03/25/18 2102 03/26/18 0417 03/26/18 0840  GLUCAP 143* 141* 129* 160* 92    Radiology Studies: No results found.  Scheduled Meds: . amLODipine  10 mg Oral Daily  . aspirin  81 mg Oral Daily  . calcitRIOL  0.25 mcg Oral Daily  . carvedilol  25 mg Oral BID WC  . Chlorhexidine Gluconate Cloth  6 each Topical Q0600  . cholecalciferol  2,000 Units Oral Daily  . darbepoetin (ARANESP) injection - DIALYSIS  100 mcg Intravenous Q Fri-HD  . ezetimibe  10 mg Oral Daily  . finasteride  5 mg Oral Daily  . folic acid  2 mg Oral Daily  . heparin  5,000 Units Subcutaneous Q8H  . hydrALAZINE  25 mg Oral Q8H  . insulin aspart  0-20 Units Subcutaneous TID WC  . insulin aspart  0-5 Units Subcutaneous QHS  . insulin glargine  7 Units Subcutaneous QHS  . niacin  200 mg Oral QHS  . prednisoLONE acetate  1 drop Left Eye BID  . sodium chloride flush  3 mL Intravenous Q12H   Continuous Infusions: . ferric gluconate (FERRLECIT/NULECIT) IV       LOS: 4 days      Bonnielee Haff, MD [facility] Pager: [email or web address]  If 7PM-7AM, please contact  night-coverage [email or web address] Password [email or web address] 03/26/2018, 11:12 AM

## 2018-03-26 NOTE — Progress Notes (Signed)
Patient resting comfortably during shift report. Denies complaints.  

## 2018-03-27 LAB — CBC
HEMATOCRIT: 24.7 % — AB (ref 39.0–52.0)
Hemoglobin: 7.6 g/dL — ABNORMAL LOW (ref 13.0–17.0)
MCH: 29.7 pg (ref 26.0–34.0)
MCHC: 30.8 g/dL (ref 30.0–36.0)
MCV: 96.5 fL (ref 78.0–100.0)
Platelets: 126 10*3/uL — ABNORMAL LOW (ref 150–400)
RBC: 2.56 MIL/uL — ABNORMAL LOW (ref 4.22–5.81)
RDW: 14.2 % (ref 11.5–15.5)
WBC: 5 10*3/uL (ref 4.0–10.5)

## 2018-03-27 LAB — GLUCOSE, CAPILLARY
GLUCOSE-CAPILLARY: 53 mg/dL — AB (ref 70–99)
GLUCOSE-CAPILLARY: 85 mg/dL (ref 70–99)
GLUCOSE-CAPILLARY: 169 mg/dL — AB (ref 70–99)
Glucose-Capillary: 50 mg/dL — ABNORMAL LOW (ref 70–99)
Glucose-Capillary: 103 mg/dL — ABNORMAL HIGH (ref 70–99)
Glucose-Capillary: 76 mg/dL (ref 70–99)
Glucose-Capillary: 117 mg/dL — ABNORMAL HIGH (ref 70–99)
Glucose-Capillary: 192 mg/dL — ABNORMAL HIGH (ref 70–99)

## 2018-03-27 LAB — BASIC METABOLIC PANEL
ANION GAP: 9 (ref 5–15)
BUN: 49 mg/dL — AB (ref 8–23)
CALCIUM: 8.4 mg/dL — AB (ref 8.9–10.3)
CO2: 26 mmol/L (ref 22–32)
Chloride: 100 mmol/L (ref 98–111)
Creatinine, Ser: 5.87 mg/dL — ABNORMAL HIGH (ref 0.61–1.24)
GFR calc Af Amer: 10 mL/min — ABNORMAL LOW (ref >60)
GFR calc non Af Amer: 9 mL/min — ABNORMAL LOW (ref >60)
GLUCOSE: 52 mg/dL — AB (ref 70–99)
Potassium: 4.3 mmol/L (ref 3.5–5.1)
Sodium: 135 mmol/L (ref 135–145)

## 2018-03-27 MED ORDER — INSULIN ASPART 100 UNIT/ML ~~LOC~~ SOLN: 0.0000 [IU] | Freq: Every day | SUBCUTANEOUS | Status: DC

## 2018-03-27 MED ORDER — INSULIN ASPART 100 UNIT/ML ~~LOC~~ SOLN
0.0000 [IU] | Freq: Three times a day (TID) | SUBCUTANEOUS | Status: DC
  Administered 2018-03-27: 2 [IU] via SUBCUTANEOUS
  Administered 2018-03-28: 1 [IU] via SUBCUTANEOUS
  Administered 2018-03-28 – 2018-03-30 (×3): 2 [IU] via SUBCUTANEOUS

## 2018-03-27 MED ORDER — CALCITRIOL 0.25 MCG PO CAPS
ORAL_CAPSULE | ORAL | Status: AC
  Filled 2018-03-27: qty 1

## 2018-03-27 NOTE — Progress Notes (Signed)
PT Cancellation Note  Patient Details Name: Gregory Hood MRN: [id number] DOB: April 30, 1951   Cancelled Treatment:    Reason Eval/Treat Not Completed: Patient at procedure or test/unavailable patient in HD and unable to participate in PT. Will follow and attempt to try back if time/schedule allow, otherwise will attempt on next day of service.    Deniece Ree PT, DPT, CBIS  Supplemental Physical Therapist [facility]    Pager [phone] Acute Rehab Office [phone]

## 2018-03-27 NOTE — [place] FL2 (Signed)
[place] MEDICAID FL2 LEVEL OF CARE SCREENING TOOL     IDENTIFICATION  Patient Name: Gregory Hood Birthdate: 1951/02/08 Sex: male Admission Date (Current Location): 03/22/2018  [facility] [place] Number:  Herbalist and Address:  [facility] [place] [address] [place], [place] [address]      Provider Number: [id number]  Attending Physician Name and Address:  Elodia Florence., *  Relative Name and Phone Number:  Arjan Strohm, [phone]    Current Level of Care: Hospital Recommended Level of Care: [place] Prior Approval Number:    Date Approved/Denied:   PASRR Number: [id number]  Discharge Plan: SNF    Current Diagnoses: Patient Active Problem List   Diagnosis Date Noted  . Acute exacerbation of CHF (congestive heart failure) ([place]) 03/22/2018  . CVA (cerebral vascular accident) ([place]) 03/22/2018  . OSA (obstructive sleep apnea) 03/22/2018  . BPH (benign prostatic hyperplasia) 03/22/2018  . Thrombocytopenia ([place]) 03/22/2018  . Normocytic anemia 03/22/2018  . Metabolic acidosis 55/73/2202  . Uremia 03/22/2018  . End stage renal disease ([place]) 03/14/2014  . Orthostatic hypotension 02/22/2014  . Elevated troponin 02/22/2014  . Chronic diastolic heart failure ([place]) 02/22/2014  . Elevated lipids 02/22/2014  . Essential hypertension, benign 02/17/2014  . DM (diabetes mellitus), type 2 with renal complications ([place]) 54/27/0623  . CKD (chronic kidney disease), stage IV ([place]) 02/17/2014  . Abnormal ECG 02/17/2014    Orientation RESPIRATION BLADDER Height & Weight     Self, Time, Situation, Place  Normal, Other (Comment)(wears CPAP at night) Continent Weight: 252 lb 13.9 oz (114.7 kg) Height:  5\' 9"  (175.3 cm)  BEHAVIORAL SYMPTOMS/MOOD NEUROLOGICAL BOWEL NUTRITION STATUS      Continent Diet(see dc summary)  AMBULATORY STATUS COMMUNICATION OF NEEDS Skin   Extensive Assist Verbally Normal                        Personal Care Assistance Level of Assistance  Bathing, Feeding, Dressing Bathing Assistance: Limited assistance Feeding assistance: Independent Dressing Assistance: Limited assistance     Functional Limitations Info  Sight, Hearing, Speech 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 acetaminophen (TYLENOL) tablet 650 mg  650 mg Oral Q6H PRN Welford Roche, MD   650 mg at 03/26/18 2153   Or  . acetaminophen (TYLENOL) suppository 650 mg  650 mg Rectal Q6H PRN Santos-Sanchez, Merlene Morse, MD      . amLODipine (NORVASC) tablet 10 mg  10 mg Oral Daily Santos-Sanchez, Idalys, MD   10 mg at 03/26/18 1048  . aspirin chewable tablet 81 mg  81 mg Oral Daily Santos-Sanchez, Idalys, MD   81 mg at 03/27/18 1409  . calcitRIOL (ROCALTROL) 0.25 MCG capsule           . calcitRIOL (ROCALTROL) capsule 0.25 mcg  0.25 mcg Oral  Daily Welford Roche, MD   0.25 mcg at 03/27/18 1241  . carvedilol (COREG) tablet 25 mg  25 mg Oral BID WC Santos-Sanchez, Idalys, MD   25 mg at 03/26/18 1735  . Chlorhexidine Gluconate Cloth 2 % PADS 6 each  6 each Topical Q0600 Edrick Oh, MD      . cholecalciferol (VITAMIN D) tablet 2,000 Units  2,000 Units Oral Daily Welford Roche, MD   2,000 Units at 03/27/18 1410  . Darbepoetin Alfa (ARANESP) injection 100 mcg  100 mcg Intravenous Q Toma Aran, MD   100 mcg at 03/24/18 0846   . ezetimibe (ZETIA) tablet 10 mg  10 mg Oral Daily Welford Roche, MD   10 mg at 03/27/18 1409  . ferric gluconate (NULECIT) 125 mg in sodium chloride 0.9 % 100 mL IVPB  125 mg Intravenous Q M,W,F-HD Edrick Oh, MD 110 mL/hr at 03/27/18 1417 125 mg at 03/27/18 1417  . finasteride (PROSCAR) tablet 5 mg  5 mg Oral Daily Santos-Sanchez, Idalys, MD   5 mg at 03/27/18 1410  . folic acid (FOLVITE) tablet 2 mg  2 mg Oral Daily Santos-Sanchez, Idalys, MD   2 mg at 03/27/18 1411  . heparin injection 5,000 Units  5,000 Units Subcutaneous Q8H Welford Roche, MD   5,000 Units at 03/27/18 1450  . hydrALAZINE (APRESOLINE) tablet 25 mg  25 mg Oral Q8H Santos-Sanchez, Idalys, MD   25 mg at 03/27/18 1450  . insulin aspart (novoLOG) injection 0-5 Units  0-5 Units Subcutaneous QHS Elodia Florence., MD      . insulin aspart (novoLOG) injection 0-9 Units  0-9 Units Subcutaneous TID WC Elodia Florence., MD      . niacin tablet 200 mg  200 mg Oral QHS Welford Roche, MD   200 mg at 03/26/18 2154  . polyethylene glycol (MIRALAX / GLYCOLAX) packet 17 g  17 g Oral Daily PRN Welford Roche, MD   17 g at 03/26/18 1052  . prednisoLONE acetate (PRED FORTE) 1 % ophthalmic suspension 1 drop  1 drop Left Eye BID Welford Roche, MD   1 drop at 03/26/18 2155  . sodium chloride flush (NS) 0.9 % injection 3 mL  3 mL Intravenous Q12H Welford Roche, MD   3 mL at 03/27/18 1414     Discharge Medications: Please see discharge summary for a list of discharge medications.  Relevant Imaging Results:  Relevant Lab Results:   Additional Information SS# [id number] pt receives dialysis (MWF), has cpap at night  Wende Neighbors, LCSW

## 2018-03-27 NOTE — Progress Notes (Signed)
PT Cancellation Note  Patient Details Name: Gregory Hood MRN: [id number] DOB: June 01, 1951   Cancelled Treatment:    Reason Eval/Treat Not Completed: Medical issues which prohibited therapy per chart review, blood glucose noted to be 52 this morning. Will follow and check back in the afternoon as/if time and schedule allow.    Deniece Ree PT, DPT, CBIS  Supplemental Physical Therapist [facility]    Pager [phone] Acute Rehab Office [phone]

## 2018-03-27 NOTE — Progress Notes (Signed)
Ferrous gluconate was not delivered from pharmacy on time to be given during HD, informed primary nurse Shirlee Limerick, RN and sent medication to floor

## 2018-03-27 NOTE — Progress Notes (Signed)
Inpatient Diabetes Program Recommendations  AACE/ADA: New Consensus Statement on Inpatient Glycemic Control (2015)  Target Ranges:  Prepandial:   less than 140 mg/dL      Peak postprandial:   less than 180 mg/dL (1-2 hours)      Critically ill patients:  140 - 180 mg/dL   Lab Results  Component Value Date   GLUCAP 117 (H) 03/27/2018   HGBA1C 7.4 (H) 02/17/2014    Review of Glycemic ControlResults for NILAY, MANGRUM (MRN [id number]) as of 03/27/2018 13:50  Ref. Range 03/27/2018 07:14 03/27/2018 07:44 03/27/2018 08:27 03/27/2018 09:21 03/27/2018 12:36 03/27/2018 13:42  Glucose-Capillary Latest Ref Range: 70 - 99 mg/dL 50 (L) 53 (L) 85 103 (H) 76 117 (H)    Diabetes history: Type 2 DM-ESRD Outpatient Diabetes medications:  None listed Current orders for Inpatient glycemic control:  Novolog resistant tid with meals and HS Lantus 7 units q HS Inpatient Diabetes Program Recommendations:    Please reduce Novolog correction to sensitive tid with meals.  May also consider d/c of Lantus?  Thanks,  Adah Perl, RN, BC-ADM Inpatient Diabetes Coordinator Pager [phone] (8a-5p)

## 2018-03-27 NOTE — Progress Notes (Signed)
Hbg 7.6 03/27/18 informed Dr Johnney Ou, will continue to observe

## 2018-03-27 NOTE — Progress Notes (Signed)
Patient refuses CPAP since 03/22/18. RT discontinued order.

## 2018-03-27 NOTE — Progress Notes (Signed)
PROGRESS NOTE    Gregory Hood  KWI:[id number] DOB: 09/15/1950 DOA: 03/22/2018 PCP: Jilda Panda, MD   Brief Narrative:  67 year old man with hx of COPD, HFpEF, CKD, TIA, CVA, OSA on CPAP and several other medical problems admitted with large volume overload and progression of acute on chronic kidney disease. He c/o chest pressure on presentation, but denied this to me today.  He's had progressive LE edema and SOB.  Nephrology has been consulted and has started HD.    Assessment & Plan:   Principal Problem:   Acute exacerbation of CHF (congestive heart failure) (HCC) Active Problems:   Elevated troponin   End stage renal disease (HCC)   Thrombocytopenia (HCC)   Normocytic anemia   Metabolic acidosis   Uremia   ESRD with volume overload  Metabolic Acidosis: -Nephrology continues to follow.  Patient received daily hemodialysis for 4 days.  Last treatment was on 9/28.  Next dialysis is planned for today, 9/30.  Next dialysis 10/2. - Bicarb d/c'd as pt on dialysis now, also d/c patiromer with initiation of dialysis. Waiting to be established with outpatient dialysis.  HFpEF, chronic: Echo 01/2015 with normal EF, grade 2 diastolic dysfunction.  Patient also with pleural effusion. - repeat echo -> notable for concentric LVH, moderate pulm hypertension, mild pericardial effusion (see report) - recommending evaluation for cardiac amyloidosis  - troponins flat - volume per renal with dialysis - cont. Coreg 25mg  bid, amlodipine 10 mg qd, hydralazine 25mg  tid - daily weights - strict I/O's - will need outpatient follow up for pulmonary hypertension and evaluation of cardiac amyloidosis  Anemia secondary to chronic kidney disease - B12 (wnl), folate (wnl), iron panel (notable for iron deficiency) - aranesp per renal as well as IV iron - folic acid 2 mg daily - Slightly lower Hb today, follow closely, hemodynamically stable, no si/sx bleeding  Thrombocytopenia:unclear etiology, no  history of thrombocytopenia.  - blood smear ordered -> path review with normocytic anemia, mild thrombocytopenia - Negative hep B, HIV negative, hepatitis C negative, B12 wnl, folate wnl - Continue to monitor.  Counts are stable.  Left Inguinal Hernia: He went to [facility] on 9/17 due to his hernia pain, and the hernia was successfully reduced in the ED.  Will need continued outpatient follow up.  TIIDM: Hypoglycemia this AM, will d/c lantus and start sensitive SSI   OSA:on home CPAP.  - CPAP ordered.   History of CVA: Stable.  Asa 81 mg  BPH: Stable.  Continue finasteride    DVT prophylaxis: heparin Code Status: full  Family Communication: will call to discuss with wife Disposition Plan: pending outaptient dialysis and SNF placement   Consultants:   nephrology  Procedures:  Transthoracic echocardiogram Study Conclusions  - Left ventricle: The cavity size was normal. There was moderate concentric hypertrophy. Systolic function was normal. The estimated ejection fraction was in the range of 55% to 60%. Wall motion was normal; there were no regional wall motion abnormalities. Features are consistent with a pseudonormal left ventricular filling pattern, with concomitant abnormal relaxation and increased filling pressure (grade 2 diastolic dysfunction). Doppler parameters are consistent with elevated ventricular end-diastolic filling pressure. - Mitral valve: Calcified annulus. Moderately thickened leaflets . There was moderate regurgitation. - Left atrium: The atrium was moderately dilated. - Right atrium: The atrium was severely dilated. - Tricuspid valve: There was moderate regurgitation. - Pulmonary arteries: Systolic pressure was moderately increased. PA peak pressure: 60 mm Hg (S). - Pericardium, extracardiac: A mild circumferential  pericardial effusion was identified. There was a large left pleural effusion.  Impressions:  -  There is moderate concentric LVH, thickening of the mitral and aortic valves, bilatrial dilatation, mild peicardial and large pleural effusion. Moderate mitral and tricuspid regurgitation, moderate pulmonary hypertension. Evaluation for cardiac amyloidosis is recommended.  Antimicrobials: Anti-infectives (From admission, onward)   None         Subjective: Feeling ok.    Objective: Vitals:   03/27/18 1200 03/27/18 1230 03/27/18 1253 03/27/18 1347  BP: 138/80 (!) 143/76 138/74 125/67  Pulse: 70 70 70 70  Resp:   13 20  Temp:   98.3 F (36.8 C) 98 F (36.7 C)  TempSrc:   Oral Oral  SpO2:   99% 100%  Weight:   114.7 kg   Height:        Intake/Output Summary (Last 24 hours) at 03/27/2018 1354 Last data filed at 03/27/2018 1253 Gross per 24 hour  Intake 483 ml  Output 3650 ml  Net -3167 ml   Filed Weights   03/27/18 0100 03/27/18 0847 03/27/18 1253  Weight: 116.9 kg 118.4 kg 114.7 kg    Examination:  General exam: Appears calm and comfortable  Respiratory system: Clear to auscultation. Respiratory effort normal. Cardiovascular system: S1 & S2 heard, RRR.  Gastrointestinal system: Abdomen is nondistended, soft and nontender. protuberant Central nervous system: Alert and oriented. No focal neurological deficits. Extremities: bilateral LEE Psychiatry: Judgement and insight appear normal. Mood & affect appropriate.     Data Reviewed: I have personally reviewed following labs and imaging studies  CBC: Recent Labs  Lab 03/22/18 1931 03/23/18 0523 03/24/18 0534 03/25/18 0538 03/27/18 0529  WBC 5.0 4.8 5.1 4.6 5.0  HGB 8.7* 8.2* 8.1* 8.2* 7.6*  HCT 27.1* 26.0* 24.8* 25.8* 24.7*  MCV 93.8 93.5 94.3 94.5 96.5  PLT 109* 103* 131* 111* 478*   Basic Metabolic Panel: Recent Labs  Lab 03/22/18 1216 03/22/18 1931 03/23/18 0523 03/24/18 0534 03/25/18 0538 03/27/18 0529  NA 135  --  135 136 135 135  K 5.0  --  4.5 5.0 3.9 4.3  CL 108  --  106 105 102  100  CO2 17*  --  20* 21* 25 26  GLUCOSE 185*  --  113* 91 81 52*  BUN 99*  --  84* 72* 51* 49*  CREATININE 9.42*  --  8.36* 7.30* 6.14* 5.87*  CALCIUM 8.5*  --  8.5* 8.5* 8.4* 8.4*  MG  --  2.2  --  2.2 2.0  --   PHOS 6.2*  --  5.8* 5.2* 4.1  --    GFR: Estimated Creatinine Clearance: 15.5 mL/min (A) (by C-G formula based on SCr of 5.87 mg/dL (H)). Liver Function Tests: Recent Labs  Lab 03/22/18 0115 03/22/18 1216 03/23/18 0523 03/24/18 0534 03/25/18 0538  AST 20  --   --   --   --   ALT 19  --   --   --   --   ALKPHOS 54  --   --   --   --   BILITOT 0.9  --   --   --   --   PROT 7.3  --   --   --   --   ALBUMIN 3.7 2.8* 3.0* 2.8* 2.9*   No results for input(s): LIPASE, AMYLASE in the last 168 hours. No results for input(s): AMMONIA in the last 168 hours. Coagulation Profile: No results for input(s): INR, PROTIME in the last  168 hours. Cardiac Enzymes: Recent Labs  Lab 03/22/18 0655 03/22/18 0947 03/22/18 1931  TROPONINI 0.07* 0.07* 0.07*   BNP (last 3 results) No results for input(s): PROBNP in the last 8760 hours. HbA1C: No results for input(s): HGBA1C in the last 72 hours. CBG: Recent Labs  Lab 03/27/18 0744 03/27/18 0827 03/27/18 0921 03/27/18 1236 03/27/18 1342  GLUCAP 53* 85 103* 76 117*   Lipid Profile: No results for input(s): CHOL, HDL, LDLCALC, TRIG, CHOLHDL, LDLDIRECT in the last 72 hours. Thyroid Function Tests: No results for input(s): TSH, T4TOTAL, FREET4, T3FREE, THYROIDAB in the last 72 hours. Anemia Panel: No results for input(s): VITAMINB12, FOLATE, FERRITIN, TIBC, IRON, RETICCTPCT in the last 72 hours. Sepsis Labs: No results for input(s): PROCALCITON, LATICACIDVEN in the last 168 hours.  No results found for this or any previous visit (from the past 240 hour(s)).       Radiology Studies: No results found.      Scheduled Meds: . amLODipine  10 mg Oral Daily  . aspirin  81 mg Oral Daily  . calcitRIOL      . calcitRIOL   0.25 mcg Oral Daily  . carvedilol  25 mg Oral BID WC  . Chlorhexidine Gluconate Cloth  6 each Topical Q0600  . cholecalciferol  2,000 Units Oral Daily  . darbepoetin (ARANESP) injection - DIALYSIS  100 mcg Intravenous Q Fri-HD  . ezetimibe  10 mg Oral Daily  . finasteride  5 mg Oral Daily  . folic acid  2 mg Oral Daily  . heparin  5,000 Units Subcutaneous Q8H  . hydrALAZINE  25 mg Oral Q8H  . insulin aspart  0-20 Units Subcutaneous TID WC  . insulin aspart  0-5 Units Subcutaneous QHS  . insulin glargine  7 Units Subcutaneous QHS  . niacin  200 mg Oral QHS  . prednisoLONE acetate  1 drop Left Eye BID  . sodium chloride flush  3 mL Intravenous Q12H   Continuous Infusions: . ferric gluconate (FERRLECIT/NULECIT) IV       LOS: 5 days    Time spent: over 30 min    Fayrene Helper, MD [facility] Pager [phone]  If 7PM-7AM, please contact night-coverage [email or web address] Password [email or web address] 03/27/2018, 1:54 PM

## 2018-03-27 NOTE — Progress Notes (Signed)
[facility] ROUNDING NOTE   Subjective:   Patient has been seen on dialysis.    Objective:  Vital signs in last 24 hours:  Temp:  [98 F (36.7 C)-98.4 F (36.9 C)] 98.4 F (36.9 C) (09/30 0847) Pulse Rate:  [62-78] (P) 68 (09/30 1130) Resp:  [17-20] 17 (09/30 0847) BP: (119-156)/(64-81) (P) 159/75 (09/30 1130) SpO2:  [97 %-100 %] 97 % (09/30 0847) Weight:  [116.9 kg-118.4 kg] 118.4 kg (09/30 0847)  Weight change: 0.537 kg Filed Weights   03/26/18 0211 03/27/18 0100 03/27/18 0847  Weight: 114.7 kg 116.9 kg 118.4 kg    Intake/Output: I/O last 3 completed shifts: In: 1203 [P.O.:1200; I.V.:3] Out: 750 [Urine:750]   Intake/Output this shift:  No intake/output data recorded.  Gen: Alert and oriented CVS- RRR no murmurs rubs gallops JVP not elevated RS- CTA diminished breath sounds at bases ABD- BS present soft non-distended EXT- 3+ LE edema Skin no lesions or rashes   Basic Metabolic Panel: Recent Labs  Lab 03/22/18 1216 03/22/18 1931 03/23/18 0523 03/24/18 0534 03/25/18 0538 03/27/18 0529  NA 135  --  135 136 135 135  K 5.0  --  4.5 5.0 3.9 4.3  CL 108  --  106 105 102 100  CO2 17*  --  20* 21* 25 26  GLUCOSE 185*  --  113* 91 81 52*  BUN 99*  --  84* 72* 51* 49*  CREATININE 9.42*  --  8.36* 7.30* 6.14* 5.87*  CALCIUM 8.5*  --  8.5* 8.5* 8.4* 8.4*  MG  --  2.2  --  2.2 2.0  --   PHOS 6.2*  --  5.8* 5.2* 4.1  --     Liver Function Tests: Recent Labs  Lab 03/22/18 0115 03/22/18 1216 03/23/18 0523 03/24/18 0534 03/25/18 0538  AST 20  --   --   --   --   ALT 19  --   --   --   --   ALKPHOS 54  --   --   --   --   BILITOT 0.9  --   --   --   --   PROT 7.3  --   --   --   --   ALBUMIN 3.7 2.8* 3.0* 2.8* 2.9*   No results for input(s): LIPASE, AMYLASE in the last 168 hours. No results for input(s): AMMONIA in the last 168 hours.  CBC: Recent Labs  Lab 03/22/18 1931 03/23/18 0523 03/24/18 0534 03/25/18 0538 03/27/18 0529  WBC  5.0 4.8 5.1 4.6 5.0  HGB 8.7* 8.2* 8.1* 8.2* 7.6*  HCT 27.1* 26.0* 24.8* 25.8* 24.7*  MCV 93.8 93.5 94.3 94.5 96.5  PLT 109* 103* 131* 111* 126*    Cardiac Enzymes: Recent Labs  Lab 03/22/18 0655 03/22/18 0947 03/22/18 1931  TROPONINI 0.07* 0.07* 0.07*    BNP: Invalid input(s): POCBNP  CBG: Recent Labs  Lab 03/26/18 2120 03/27/18 0714 03/27/18 0744 03/27/18 0827 03/27/18 0921  GLUCAP 151* 50* 53* 85 103*    Microbiology: No results found for this or any previous visit.  Coagulation Studies: No results for input(s): LABPROT, INR in the last 72 hours.  Urinalysis: No results for input(s): COLORURINE, LABSPEC, PHURINE, GLUCOSEU, HGBUR, BILIRUBINUR, KETONESUR, PROTEINUR, UROBILINOGEN, NITRITE, LEUKOCYTESUR in the last 72 hours.  Invalid input(s): APPERANCEUR    Imaging: No results found.   Medications:   . ferric gluconate (FERRLECIT/NULECIT) IV     . amLODipine  10 mg Oral Daily  .  aspirin  81 mg Oral Daily  . calcitRIOL  0.25 mcg Oral Daily  . carvedilol  25 mg Oral BID WC  . Chlorhexidine Gluconate Cloth  6 each Topical Q0600  . cholecalciferol  2,000 Units Oral Daily  . darbepoetin (ARANESP) injection - DIALYSIS  100 mcg Intravenous Q Fri-HD  . ezetimibe  10 mg Oral Daily  . finasteride  5 mg Oral Daily  . folic acid  2 mg Oral Daily  . heparin  5,000 Units Subcutaneous Q8H  . hydrALAZINE  25 mg Oral Q8H  . insulin aspart  0-20 Units Subcutaneous TID WC  . insulin aspart  0-5 Units Subcutaneous QHS  . insulin glargine  7 Units Subcutaneous QHS  . niacin  200 mg Oral QHS  . prednisoLONE acetate  1 drop Left Eye BID  . sodium chloride flush  3 mL Intravenous Q12H   acetaminophen **OR** acetaminophen, polyethylene glycol  Assessment/ Plan:   Chronic renal insufficiency stage V AV fistula placed 2016 he continues to do well with dialysis at low needle gauge 17-gauge needles.  AV fistula appears to be doing well.  Has undergone #4 dialysis  treatments - #5 today with UF 4L, continue to work on resolving edema.  Dialysis initiated 03/22/2018.  CLIP process in place.  Plan next dialysis 10/2.  Secondary hyperparathyroidism continue calcitriol also taking oral cholecalciferol 2000 units daily  Anemia start on Aranesp 03/24/2018 dose 100 mcg iron saturations 12% On IV iron. Continues on oral folic acid 2 mg daily  Hypertension/volume appears to be doing well with ultrafiltration on dialysis.  Amlodipine 10 mg daily, Coreg 25 mg twice daily, Proscar 5 mg daily, hydralazine 25 mg 3 times daily  Metabolic acidosis now managed with HD  Congestive heart failure with diastolic dysfunction 2D echo seem to indicate possibilities of amyloidosis will need cardiac outpatient follow-up. Managing edema with dialysis.   Diabetes mellitus as per primary service  Hyperlipidemia niacin 200 mg at bedtime and Zetia 10 mg daily  Mild thrombocytopenia negative hepatitis studies.  Appears to be mild and not acutely dropping  left inguinal hernia outpatient follow-up reducible in the ER  Obstructive sleep apnea noncompliant with CPAP  History of CVA patient using aspirin 81 mg daily   LOS: 5 Jannifer Hick A @TODAY @11 :57 AM

## 2018-03-27 NOTE — Clinical Social Work Note (Signed)
Clinical Social Work Assessment  Patient Details  Name: Gregory Hood MRN: [id number] Date of Birth: March 19, 1951  Date of referral:  03/27/18               Reason for consult:  Discharge Planning, Facility Placement                Permission sought to share information with:  Family Supports Permission granted to share information::  Yes, Verbal Permission Granted  Name::     Elishua Radford  Agency::  snf  Relationship::  spouse  Contact Information:  [phone]  Housing/Transportation Living arrangements for the past 2 months:  [place] of Information:  Patient Patient Interpreter Needed:  None Criminal Activity/Legal Involvement Pertinent to Current Situation/Hospitalization:  No - Comment as needed Significant Relationships:  Other Family Members, Spouse, Adult Children Lives with:  Spouse Do you feel safe going back to the place where you live?  Yes Need for family participation in patient care:  Yes (Comment)  Care giving concerns:  No family at bedside. Patient stated he lives at home with spouse and she is supportive of his needs. Patient stated he has 2 adult sons that live in the area that are also able to offer help if needed.   Social Worker assessment / plan:  CSW met patient at bedside to discuss discharge plan and offer support. Patient stated he wants to go home to wife and is not interested in going to a rehab facility. Patient gave CSW verbal permission to speak to wife to talk about discharge plan. CSW contacted patients spouse and left a voicemail for spouse to please return phone call. At this time patient is refusing SNF and wanting to discharge home with home health.  Employment status:  Retired Forensic scientist:  [employer] PT Recommendations:  [place] / Referral to community resources:  [place]  Patient/Family's Response to care:  Patient wanting to return home with wife  Patient/Family's  Understanding of and Emotional Response to Diagnosis, Current Treatment, and Prognosis:  Patient wanting to go home with home health  Emotional Assessment Appearance:  Appears stated age Attitude/Demeanor/Rapport:  Engaged Affect (typically observed):  Accepting Orientation:  Oriented to Self, Oriented to Place, Oriented to Situation, Oriented to  Time Alcohol / Substance use:  Not Applicable Psych involvement (Current and /or in the community):  No (Comment)  Discharge Needs  Concerns to be addressed:  Care Coordination Readmission within the last 30 days:  No Current discharge risk:  Dependent with Mobility Barriers to Discharge:  Continued Medical Work up   [employer], LCSW 03/27/2018, 2:18 PM

## 2018-03-28 ENCOUNTER — Inpatient Hospital Stay (HOSPITAL_COMMUNITY): Payer: Medicare Other

## 2018-03-28 DIAGNOSIS — R609 Edema, unspecified: Secondary | ICD-10-CM

## 2018-03-28 LAB — CBC
HCT: 25.8 % — ABNORMAL LOW (ref 39.0–52.0)
Hemoglobin: 8.1 g/dL — ABNORMAL LOW (ref 13.0–17.0)
MCH: 30.3 pg (ref 26.0–34.0)
MCHC: 31.4 g/dL (ref 30.0–36.0)
MCV: 96.6 fL (ref 78.0–100.0)
Platelets: 126 10*3/uL — ABNORMAL LOW (ref 150–400)
RBC: 2.67 MIL/uL — ABNORMAL LOW (ref 4.22–5.81)
RDW: 14.1 % (ref 11.5–15.5)
WBC: 5.4 10*3/uL (ref 4.0–10.5)

## 2018-03-28 LAB — BASIC METABOLIC PANEL
Anion gap: 4 — ABNORMAL LOW (ref 5–15)
BUN: 37 mg/dL — AB (ref 8–23)
CALCIUM: 8.1 mg/dL — AB (ref 8.9–10.3)
CO2: 27 mmol/L (ref 22–32)
CREATININE: 4.94 mg/dL — AB (ref 0.61–1.24)
Chloride: 103 mmol/L (ref 98–111)
GFR calc Af Amer: 13 mL/min — ABNORMAL LOW (ref >60)
GFR, EST NON AFRICAN AMERICAN: 11 mL/min — AB (ref >60)
Glucose, Bld: 110 mg/dL — ABNORMAL HIGH (ref 70–99)
Potassium: 4 mmol/L (ref 3.5–5.1)
SODIUM: 134 mmol/L — AB (ref 135–145)

## 2018-03-28 LAB — GLUCOSE, CAPILLARY
GLUCOSE-CAPILLARY: 178 mg/dL — AB (ref 70–99)
Glucose-Capillary: 108 mg/dL — ABNORMAL HIGH (ref 70–99)
Glucose-Capillary: 165 mg/dL — ABNORMAL HIGH (ref 70–99)
Glucose-Capillary: 129 mg/dL — ABNORMAL HIGH (ref 70–99)

## 2018-03-28 LAB — MAGNESIUM: Magnesium: 2 mg/dL (ref 1.7–2.4)

## 2018-03-28 MED ORDER — ALTEPLASE 2 MG IJ SOLR: 2.0000 mg | Freq: Once | INTRAMUSCULAR | Status: DC | PRN

## 2018-03-28 MED ORDER — POLYETHYLENE GLYCOL 3350 17 G PO PACK
17.0000 g | PACK | Freq: Two times a day (BID) | ORAL | Status: DC
  Administered 2018-03-28 – 2018-03-30 (×3): 17 g via ORAL
  Filled 2018-03-28 (×4): qty 1

## 2018-03-28 MED ORDER — CHLORHEXIDINE GLUCONATE CLOTH 2 % EX PADS: 6.0000 | MEDICATED_PAD | Freq: Every day | CUTANEOUS | Status: DC

## 2018-03-28 MED ORDER — SODIUM CHLORIDE 0.9 % IV SOLN: 100.0000 mL | INTRAVENOUS | Status: DC | PRN

## 2018-03-28 MED ORDER — SENNA 8.6 MG PO TABS
2.0000 | ORAL_TABLET | Freq: Every day | ORAL | Status: DC
  Administered 2018-03-28 – 2018-03-30 (×3): 17.2 mg via ORAL
  Filled 2018-03-28 (×3): qty 2

## 2018-03-28 MED ORDER — LIDOCAINE-PRILOCAINE 2.5-2.5 % EX CREA
1.0000 "application " | TOPICAL_CREAM | CUTANEOUS | Status: DC | PRN
  Filled 2018-03-28: qty 5

## 2018-03-28 MED ORDER — LIDOCAINE HCL (PF) 1 % IJ SOLN: 5.0000 mL | INTRAMUSCULAR | Status: DC | PRN

## 2018-03-28 MED ORDER — PENTAFLUOROPROP-TETRAFLUOROETH EX AERO: 1.0000 "application " | INHALATION_SPRAY | CUTANEOUS | Status: DC | PRN

## 2018-03-28 MED ORDER — HEPARIN SODIUM (PORCINE) 1000 UNIT/ML DIALYSIS
1000.0000 [IU] | INTRAMUSCULAR | Status: DC | PRN
  Filled 2018-03-28: qty 1

## 2018-03-28 MED ORDER — FUROSEMIDE 80 MG PO TABS
80.0000 mg | ORAL_TABLET | Freq: Two times a day (BID) | ORAL | Status: DC
  Administered 2018-03-28 – 2018-03-30 (×5): 80 mg via ORAL
  Filled 2018-03-28 (×5): qty 1

## 2018-03-28 NOTE — Plan of Care (Signed)
  Problem: Education: Goal: Knowledge of General Education information will improve Description Including pain rating scale, medication(s)/side effects and non-pharmacologic comfort measures Outcome: Progressing   Problem: Nutrition: Goal: Adequate nutrition will be maintained Outcome: Progressing   Problem: Coping: Goal: Level of anxiety will decrease Outcome: Progressing   Problem: Nutritional: Goal: Ability to make healthy dietary choices will improve Outcome: Progressing   Problem: Safety: Goal: Ability to remain free from injury will improve Outcome: Completed/Met

## 2018-03-28 NOTE — Progress Notes (Signed)
Physical Therapy Treatment Patient Details Name: Gregory Hood MRN: [id number] DOB: [birth year] Today's Date: 03/28/2018    History of Present Illness Pt is a 67 y/o male admitted secondary to worsening LE swelling and SOB. Likely secondary to acute CHF exacerbation. Pt also with progression of CKD and has started HD during stay. PMH includes COPD, CKD, CVA, OSA on CPAP, LUE AV fistula, and HTN.     PT Comments    Patient received in bed, pleasant and willing to work with PT this afternoon. He requires S-MinA for functional bed mobility, and was able to complete functional transfers with min guard from elevated surface but does require ModA for transfers from standard height/lower surfaces. Able to transfer into chair and back into bed but declines ambulation/gait training this afternoon. He was left in bed with [place] tech present and attending, all other needs met this afternoon.    Follow Up Recommendations  SNF     Equipment Recommendations  None recommended by PT    Recommendations for Other Services       Precautions / Restrictions Precautions Precautions: Fall Restrictions Weight Bearing Restrictions: No    Mobility  Bed Mobility Overal bed mobility: Needs Assistance Bed Mobility: Supine to Sit;Sit to Supine     Supine to sit: Supervision;HOB elevated Sit to supine: Min assist   General bed mobility comments: extended time for all functional bed mobility, increased time for processing and initiation of movement, MinA for LEs with return to bed   Transfers Overall transfer level: Needs assistance Equipment used: Rolling walker (2 wheeled) Transfers: Sit to/from [facility] Sit to Stand: Min guard;From elevated surface Stand pivot transfers: Min guard       General transfer comment: min guard for functional transfers from elevated surfaces today, however does require ModA for functional transfers from standard height and lower surfaces, cues for  sequencing and safety   Ambulation/Gait             General Gait Details: declined gait, but able to take pivotal steps during transfer to and from bed with RW    Stairs             Wheelchair Mobility    Modified Rankin (Stroke Patients Only)       Balance Overall balance assessment: Needs assistance Sitting-balance support: No upper extremity supported;Feet supported Sitting balance-Leahy Scale: Good     Standing balance support: Bilateral upper extremity supported Standing balance-Leahy Scale: Poor                              Cognition Arousal/Alertness: Awake/alert Behavior During Therapy: WFL for tasks assessed/performed Overall Cognitive Status: No family/caregiver present to determine baseline cognitive functioning                                        Exercises      General Comments        Pertinent Vitals/Pain Pain Assessment: No/denies pain Faces Pain Scale: No hurt Pain Intervention(s): Limited activity within patient's tolerance;Monitored during session    Home Living                      Prior Function            PT Goals (current goals can now be found in the care plan section) Acute  Rehab PT Goals Patient Stated Goal: to go home PT Goal Formulation: With patient Time For Goal Achievement: 04/07/18 Potential to Achieve Goals: Fair Progress towards PT goals: Progressing toward goals    Frequency    Min 2X/week      PT Plan Current plan remains appropriate    Co-evaluation              AM-PAC PT "6 Clicks" Daily Activity  Outcome Measure  Difficulty turning over in bed (including adjusting bedclothes, sheets and blankets)?: A Little Difficulty moving from lying on back to sitting on the side of the bed? : A Little Difficulty sitting down on and standing up from a chair with arms (e.g., wheelchair, bedside commode, etc,.)?: A Lot Help needed moving to and from a bed to chair  (including a wheelchair)?: A Lot Help needed walking in hospital room?: A Lot Help needed climbing 3-5 steps with a railing? : Total 6 Click Score: 13    End of Session   Activity Tolerance: Patient tolerated treatment well Patient left: in bed;with call bell/phone within reach;Other (comment)(in care of [place] tech )   PT Visit Diagnosis: Unsteadiness on feet (R26.81);Muscle weakness (generalized) (M62.81);Difficulty in walking, not elsewhere classified (R26.2)     Time: 3125-0871 PT Time Calculation (min) (ACUTE ONLY): 16 min  Charges:  $Therapeutic Activity: 8-22 mins                     Deniece Ree PT, DPT, CBIS  Supplemental Physical Therapist [place]    Pager [phone] Acute Rehab Office [phone]

## 2018-03-28 NOTE — Plan of Care (Signed)
  Problem: Health Behavior/Discharge Planning: Goal: Ability to manage health-related needs will improve Outcome: Progressing   Problem: Clinical Measurements: Goal: Ability to maintain clinical measurements within normal limits will improve Outcome: Progressing Goal: Will remain free from infection Outcome: Progressing Goal: Diagnostic test results will improve Outcome: Progressing Goal: Respiratory complications will improve Outcome: Progressing Goal: Cardiovascular complication will be avoided Outcome: Progressing   Problem: Nutrition: Goal: Adequate nutrition will be maintained Outcome: Progressing   Problem: Coping: Goal: Level of anxiety will decrease Outcome: Progressing   Problem: Safety: Goal: Ability to remain free from injury will improve Outcome: Progressing   Problem: Skin Integrity: Goal: Risk for impaired skin integrity will decrease Outcome: Progressing   Problem: Education: Goal: Ability to demonstrate management of disease process will improve Outcome: Progressing   Problem: Activity: Goal: Capacity to carry out activities will improve Outcome: Progressing   Problem: Cardiac: Goal: Ability to achieve and maintain adequate cardiopulmonary perfusion will improve Outcome: Progressing   Problem: Education: Goal: Knowledge of disease and its progression will improve Outcome: Progressing   Problem: Fluid Volume: Goal: Compliance with measures to maintain balanced fluid volume will improve Outcome: Progressing   Problem: Health Behavior/Discharge Planning: Goal: Ability to manage health-related needs will improve Outcome: Progressing   Problem: Nutritional: Goal: Ability to make healthy dietary choices will improve Outcome: Progressing   Problem: Clinical Measurements: Goal: Complications related to the disease process, condition or treatment will be avoided or minimized Outcome: Progressing

## 2018-03-28 NOTE — Progress Notes (Signed)
PROGRESS NOTE    JODI KAPPES  NFA:[id number] DOB: Jan 08, 1951 DOA: 03/22/2018 PCP: Jilda Panda, MD   Brief Narrative:  67 year old man with hx of COPD, HFpEF, CKD, TIA, CVA, OSA on CPAP and several other medical problems admitted with large volume overload and progression of acute on chronic kidney disease. He c/o chest pressure on presentation, but denied this to me today.  He's had progressive LE edema and SOB.  Nephrology has been consulted and has started HD.  His d/c is currently pending SNF placement and establishing with outpatient dialysis facility.   Assessment & Plan:   Principal Problem:   Acute exacerbation of CHF (congestive heart failure) (HCC) Active Problems:   Elevated troponin   End stage renal disease (HCC)   Thrombocytopenia (HCC)   Normocytic anemia   Metabolic acidosis   Uremia   ESRD with volume overload  Metabolic Acidosis: -Nephrology continues to follow.  Patient received daily hemodialysis for 4 days.  Last treatment was on 9/28.  Next dialysis 10/2. - lasix 80 po BID added by nephrology - Bicarb d/c'd as pt on dialysis now, also d/c patiromer with initiation of dialysis. Waiting to be established with outpatient dialysis.  HFpEF, chronic: Echo 01/2015 with normal EF, grade 2 diastolic dysfunction.  Patient also with pleural effusion. - repeat echo -> notable for concentric LVH, moderate pulm hypertension, mild pericardial effusion (see report) - recommending evaluation for cardiac amyloidosis  - troponins flat - volume per renal with dialysis - cont. Coreg 25mg  bid, amlodipine 10 mg qd, hydralazine 25mg  tid - daily weights - strict I/O's - will need outpatient follow up for pulmonary hypertension and evaluation of cardiac amyloidosis  Right upper extremity edema: follow RUE [place] -> prelim negative  Anemia secondary to chronic kidney disease - B12 (wnl), folate (wnl), iron panel (notable for iron deficiency) - aranesp per renal as well as IV  iron - folic acid 2 mg daily - Stable H/H  Thrombocytopenia:unclear etiology, no history of thrombocytopenia.  - blood smear ordered -> path review with normocytic anemia, mild thrombocytopenia - Negative hep B, HIV negative, hepatitis C negative, B12 wnl, folate wnl - Continue to monitor.  Counts are stable.  Left Inguinal Hernia: He went to [facility] on 9/17 due to his hernia pain, and the hernia was successfully reduced in the ED.  Will need continued outpatient follow up.  TIIDM: He had hypoglycemia earlier in hospitalization.  Lantus dc'd and sensitive SSI started.  OSA:on home CPAP.  - CPAP ordered.   History of CVA: Stable.  Asa 81 mg  BPH: Stable.  Continue finasteride    DVT prophylaxis: heparin Code Status: full  Family Communication: wife over phone Disposition Plan: pending outaptient dialysis and SNF placement   Consultants:   nephrology  Procedures:  Transthoracic echocardiogram Study Conclusions  - Left ventricle: The cavity size was normal. There was moderate concentric hypertrophy. Systolic function was normal. The estimated ejection fraction was in the range of 55% to 60%. Wall motion was normal; there were no regional wall motion abnormalities. Features are consistent with a pseudonormal left ventricular filling pattern, with concomitant abnormal relaxation and increased filling pressure (grade 2 diastolic dysfunction). Doppler parameters are consistent with elevated ventricular end-diastolic filling pressure. - Mitral valve: Calcified annulus. Moderately thickened leaflets . There was moderate regurgitation. - Left atrium: The atrium was moderately dilated. - Right atrium: The atrium was severely dilated. - Tricuspid valve: There was moderate regurgitation. - Pulmonary arteries: Systolic pressure was moderately  increased. PA peak pressure: 60 mm Hg (S). - Pericardium, extracardiac: A mild circumferential  pericardial effusion was identified. There was a large left pleural effusion.  Impressions:  - There is moderate concentric LVH, thickening of the mitral and aortic valves, bilatrial dilatation, mild peicardial and large pleural effusion. Moderate mitral and tricuspid regurgitation, moderate pulmonary hypertension. Evaluation for cardiac amyloidosis is recommended.  Antimicrobials: Anti-infectives (From admission, onward)   None         Subjective: No complaints today.  Objective: Vitals:   03/28/18 0358 03/28/18 0512 03/28/18 0951 03/28/18 1738  BP: (!) 145/83 (!) 149/72 (!) 137/114 (!) 123/52  Pulse: 76  70 70  Resp: 18     Temp: 98.7 F (37.1 C)     TempSrc: Oral     SpO2: 100%  98% 98%  Weight: 114.2 kg     Height:        Intake/Output Summary (Last 24 hours) at 03/28/2018 1809 Last data filed at 03/28/2018 1300 Gross per 24 hour  Intake 1043 ml  Output 396 ml  Net 647 ml   Filed Weights   03/27/18 0847 03/27/18 1253 03/28/18 0358  Weight: 118.4 kg 114.7 kg 114.2 kg    Examination:  General: No acute distress. Cardiovascular: Heart sounds show a regular rate, and rhythm. Lungs: Clear to auscultation bilaterally Abdomen: Soft, nontender, nondistended Neurological: Alert and oriented 3. Moves all extremities 4. Cranial nerves II through XII grossly intact. Skin: Warm and dry. No rashes or lesions. Extremities: No clubbing or cyanosis. Bilateral LEE.  RUE edema. Psychiatric: Mood and affect are normal. Insight and judgment are appropriate.   Data Reviewed: I have personally reviewed following labs and imaging studies  CBC: Recent Labs  Lab 03/23/18 0523 03/24/18 0534 03/25/18 0538 03/27/18 0529 03/28/18 0443  WBC 4.8 5.1 4.6 5.0 5.4  HGB 8.2* 8.1* 8.2* 7.6* 8.1*  HCT 26.0* 24.8* 25.8* 24.7* 25.8*  MCV 93.5 94.3 94.5 96.5 96.6  PLT 103* 131* 111* 126* 657*   Basic Metabolic Panel: Recent Labs  Lab 03/22/18 1216 03/22/18 1931  03/23/18 0523 03/24/18 0534 03/25/18 0538 03/27/18 0529 03/28/18 0443  NA 135  --  135 136 135 135 134*  K 5.0  --  4.5 5.0 3.9 4.3 4.0  CL 108  --  106 105 102 100 103  CO2 17*  --  20* 21* 25 26 27   GLUCOSE 185*  --  113* 91 81 52* 110*  BUN 99*  --  84* 72* 51* 49* 37*  CREATININE 9.42*  --  8.36* 7.30* 6.14* 5.87* 4.94*  CALCIUM 8.5*  --  8.5* 8.5* 8.4* 8.4* 8.1*  MG  --  2.2  --  2.2 2.0  --  2.0  PHOS 6.2*  --  5.8* 5.2* 4.1  --   --    GFR: Estimated Creatinine Clearance: 18.3 mL/min (A) (by C-G formula based on SCr of 4.94 mg/dL (H)). Liver Function Tests: Recent Labs  Lab 03/22/18 0115 03/22/18 1216 03/23/18 0523 03/24/18 0534 03/25/18 0538  AST 20  --   --   --   --   ALT 19  --   --   --   --   ALKPHOS 54  --   --   --   --   BILITOT 0.9  --   --   --   --   PROT 7.3  --   --   --   --   ALBUMIN 3.7 2.8*  3.0* 2.8* 2.9*   No results for input(s): LIPASE, AMYLASE in the last 168 hours. No results for input(s): AMMONIA in the last 168 hours. Coagulation Profile: No results for input(s): INR, PROTIME in the last 168 hours. Cardiac Enzymes: Recent Labs  Lab 03/22/18 0655 03/22/18 0947 03/22/18 1931  TROPONINI 0.07* 0.07* 0.07*   BNP (last 3 results) No results for input(s): PROBNP in the last 8760 hours. HbA1C: No results for input(s): HGBA1C in the last 72 hours. CBG: Recent Labs  Lab 03/27/18 1623 03/27/18 2111 03/28/18 0738 03/28/18 1141 03/28/18 1617  GLUCAP 169* 192* 108* 165* 129*   Lipid Profile: No results for input(s): CHOL, HDL, LDLCALC, TRIG, CHOLHDL, LDLDIRECT in the last 72 hours. Thyroid Function Tests: No results for input(s): TSH, T4TOTAL, FREET4, T3FREE, THYROIDAB in the last 72 hours. Anemia Panel: No results for input(s): VITAMINB12, FOLATE, FERRITIN, TIBC, IRON, RETICCTPCT in the last 72 hours. Sepsis Labs: No results for input(s): PROCALCITON, LATICACIDVEN in the last 168 hours.  No results found for this or any previous  visit (from the past 240 hour(s)).       Radiology Studies: No results found.      Scheduled Meds: . amLODipine  10 mg Oral Daily  . aspirin  81 mg Oral Daily  . calcitRIOL  0.25 mcg Oral Daily  . carvedilol  25 mg Oral BID WC  . Chlorhexidine Gluconate Cloth  6 each Topical Q0600  . cholecalciferol  2,000 Units Oral Daily  . darbepoetin (ARANESP) injection - DIALYSIS  100 mcg Intravenous Q Fri-HD  . ezetimibe  10 mg Oral Daily  . finasteride  5 mg Oral Daily  . folic acid  2 mg Oral Daily  . furosemide  80 mg Oral BID  . heparin  5,000 Units Subcutaneous Q8H  . hydrALAZINE  25 mg Oral Q8H  . insulin aspart  0-5 Units Subcutaneous QHS  . insulin aspart  0-9 Units Subcutaneous TID WC  . niacin  200 mg Oral QHS  . polyethylene glycol  17 g Oral BID  . prednisoLONE acetate  1 drop Left Eye BID  . senna  2 tablet Oral Daily  . sodium chloride flush  3 mL Intravenous Q12H   Continuous Infusions: . sodium chloride    . sodium chloride    . ferric gluconate (FERRLECIT/NULECIT) IV 125 mg (03/27/18 1417)     LOS: 6 days    Time spent: over 30 min    Fayrene Helper, MD [facility] Pager [phone]  If 7PM-7AM, please contact night-coverage [email or web address] Password [email or web address] 03/28/2018, 6:09 PM

## 2018-03-28 NOTE — Clinical Social Work Note (Addendum)
Per RN, patient is not fully oriented and unable to make decision regarding SNF. CSW called his wife and provided bed offers. [facility] does not take dialysis patients. Patient's wife has chosen [facility]. CSW left message for their hospital liaison to notify. Patient's wife stated she has spoken with him about SNF and he is aware he needs to go for short-term placement.  Dayton Scrape, CSW [phone]  3:13 pm Per SNF clinical liaison, patient will have to be clipped in [place] on a MWF schedule for them to transport him.  Dayton Scrape, [place]

## 2018-03-28 NOTE — Progress Notes (Signed)
[facility] ROUNDING NOTE   Subjective:   Feels fine, no issues with HD yesterday.   Appears SNF placement pending - he is unaware of this.     Objective:  Vital signs in last 24 hours:  Temp:  [98 F (36.7 C)-98.9 F (37.2 C)] 98.7 F (37.1 C) (10/01 0358) Pulse Rate:  [62-81] 76 (10/01 0358) Resp:  [13-20] 18 (10/01 0358) BP: (123-159)/(66-83) 149/72 (10/01 0512) SpO2:  [97 %-100 %] 100 % (10/01 0358) Weight:  [114.2 kg-114.7 kg] 114.2 kg (10/01 0358)  Weight change: 1.463 kg Filed Weights   03/27/18 0847 03/27/18 1253 03/28/18 0358  Weight: 118.4 kg 114.7 kg 114.2 kg    Intake/Output: I/O last 3 completed shifts: In: 1206 [P.O.:1200; I.V.:6] Out: 4096 [Urine:595; Other:3500; Stool:1]   Intake/Output this shift:  No intake/output data recorded.  Gen: Alert and oriented CVS- RRR no murmurs rubs gallops JVP not elevated RS- CTA diminished breath sounds at bases ABD- BS present soft non-distended EXT- 3+ LE edema, RUE 2+ pitting, LUE AVF + T/B, no LUE edema Skin no lesions or rashes   Basic Metabolic Panel: Recent Labs  Lab 03/22/18 1216 03/22/18 1931 03/23/18 0523 03/24/18 0534 03/25/18 0538 03/27/18 0529 03/28/18 0443  NA 135  --  135 136 135 135 134*  K 5.0  --  4.5 5.0 3.9 4.3 4.0  CL 108  --  106 105 102 100 103  CO2 17*  --  20* 21* 25 26 27   GLUCOSE 185*  --  113* 91 81 52* 110*  BUN 99*  --  84* 72* 51* 49* 37*  CREATININE 9.42*  --  8.36* 7.30* 6.14* 5.87* 4.94*  CALCIUM 8.5*  --  8.5* 8.5* 8.4* 8.4* 8.1*  MG  --  2.2  --  2.2 2.0  --  2.0  PHOS 6.2*  --  5.8* 5.2* 4.1  --   --     Liver Function Tests: Recent Labs  Lab 03/22/18 0115 03/22/18 1216 03/23/18 0523 03/24/18 0534 03/25/18 0538  AST 20  --   --   --   --   ALT 19  --   --   --   --   ALKPHOS 54  --   --   --   --   BILITOT 0.9  --   --   --   --   PROT 7.3  --   --   --   --   ALBUMIN 3.7 2.8* 3.0* 2.8* 2.9*   No results for input(s): LIPASE, AMYLASE in  the last 168 hours. No results for input(s): AMMONIA in the last 168 hours.  CBC: Recent Labs  Lab 03/23/18 0523 03/24/18 0534 03/25/18 0538 03/27/18 0529 03/28/18 0443  WBC 4.8 5.1 4.6 5.0 5.4  HGB 8.2* 8.1* 8.2* 7.6* 8.1*  HCT 26.0* 24.8* 25.8* 24.7* 25.8*  MCV 93.5 94.3 94.5 96.5 96.6  PLT 103* 131* 111* 126* 126*    Cardiac Enzymes: Recent Labs  Lab 03/22/18 0655 03/22/18 0947 03/22/18 1931  TROPONINI 0.07* 0.07* 0.07*    BNP: Invalid input(s): POCBNP  CBG: Recent Labs  Lab 03/27/18 1236 03/27/18 1342 03/27/18 1623 03/27/18 2111 03/28/18 0738  GLUCAP 76 117* 169* 192* 108*    Microbiology: No results found for this or any previous visit.  Coagulation Studies: No results for input(s): LABPROT, INR in the last 72 hours.  Urinalysis: No results for input(s): COLORURINE, LABSPEC, PHURINE, GLUCOSEU, HGBUR, BILIRUBINUR, KETONESUR, PROTEINUR, UROBILINOGEN, NITRITE,  LEUKOCYTESUR in the last 72 hours.  Invalid input(s): APPERANCEUR    Imaging: No results found.   Medications:   . ferric gluconate (FERRLECIT/NULECIT) IV 125 mg (03/27/18 1417)   . amLODipine  10 mg Oral Daily  . aspirin  81 mg Oral Daily  . calcitRIOL  0.25 mcg Oral Daily  . carvedilol  25 mg Oral BID WC  . Chlorhexidine Gluconate Cloth  6 each Topical Q0600  . cholecalciferol  2,000 Units Oral Daily  . darbepoetin (ARANESP) injection - DIALYSIS  100 mcg Intravenous Q Fri-HD  . ezetimibe  10 mg Oral Daily  . finasteride  5 mg Oral Daily  . folic acid  2 mg Oral Daily  . heparin  5,000 Units Subcutaneous Q8H  . hydrALAZINE  25 mg Oral Q8H  . insulin aspart  0-5 Units Subcutaneous QHS  . insulin aspart  0-9 Units Subcutaneous TID WC  . niacin  200 mg Oral QHS  . prednisoLONE acetate  1 drop Left Eye BID  . sodium chloride flush  3 mL Intravenous Q12H   acetaminophen **OR** acetaminophen, polyethylene glycol  Assessment/ Plan:   ESRD:  S/p HD #5 9/30, tolerating well, AVF  working well.  HD again tomorrow, up to 16g needles.  CLIP process in place.  For his anasarca I have added lasix 80 po BID for now to help with diuresis on non HD days.    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  Check venous PVL but if negative could consider CT venogram.   Secondary hyperparathyroidism continue calcitriol also taking oral cholecalciferol 2000 units daily  Anemia started on Aranesp 03/24/2018 dose 100 mcg to be given qweek; iron saturations 12% On IV iron. Continues on oral folic acid 2 mg daily  Hypertension/volume appears to be doing well with ultrafiltration on dialysis.  Amlodipine 10 mg daily, Coreg 25 mg twice daily, Proscar 5 mg daily, hydralazine 25 mg 3 times daily  Metabolic acidosis now managed with HD  Congestive heart failure with diastolic dysfunction 2D echo seem to indicate possibilities of amyloidosis will need cardiac outpatient follow-up. Managing edema with dialysis and addition of lasix.   Diabetes mellitus as per primary service  Hyperlipidemia niacin 200 mg at bedtime and Zetia 10 mg daily  Mild thrombocytopenia negative eval.  Appears to be mild and not acutely dropping.  Holding heparin with hD.  left inguinal hernia outpatient follow-up reducible in the ER  Obstructive sleep apnea noncompliant with CPAP  History of CVA patient using aspirin 81 mg daily   LOS: 6 Gregory Hood @TODAY @8 :50 AM

## 2018-03-28 NOTE — Progress Notes (Signed)
RUE venous duplex prelim: negative for DVT and SVT. Gregory Hood Eun[place], RDMS, RVT  

## 2018-03-28 NOTE — Care Management Important Message (Signed)
Important Message  Patient Details  Name: Gregory Hood MRN: [id number] Date of Birth: Dec 02, 1950   Medicare Important Message Given:  Yes    Chazz Philson P Patrica Mendell 03/28/2018, 1:25 PM

## 2018-03-29 LAB — CBC
HCT: 26.9 % — ABNORMAL LOW (ref 39.0–52.0)
Hemoglobin: 8.4 g/dL — ABNORMAL LOW (ref 13.0–17.0)
MCH: 30 pg (ref 26.0–34.0)
MCHC: 31.2 g/dL (ref 30.0–36.0)
MCV: 96.1 fL (ref 78.0–100.0)
Platelets: 143 10*3/uL — ABNORMAL LOW (ref 150–400)
RBC: 2.8 MIL/uL — ABNORMAL LOW (ref 4.22–5.81)
RDW: 14.2 % (ref 11.5–15.5)
WBC: 5.5 10*3/uL (ref 4.0–10.5)

## 2018-03-29 LAB — BASIC METABOLIC PANEL
Anion gap: 8 (ref 5–15)
BUN: 45 mg/dL — ABNORMAL HIGH (ref 8–23)
CHLORIDE: 100 mmol/L (ref 98–111)
CO2: 26 mmol/L (ref 22–32)
Calcium: 8.5 mg/dL — ABNORMAL LOW (ref 8.9–10.3)
Creatinine, Ser: 5.91 mg/dL — ABNORMAL HIGH (ref 0.61–1.24)
GFR calc Af Amer: 10 mL/min — ABNORMAL LOW (ref >60)
GFR calc non Af Amer: 9 mL/min — ABNORMAL LOW (ref >60)
GLUCOSE: 117 mg/dL — AB (ref 70–99)
POTASSIUM: 4.5 mmol/L (ref 3.5–5.1)
Sodium: 134 mmol/L — ABNORMAL LOW (ref 135–145)

## 2018-03-29 LAB — GLUCOSE, CAPILLARY
GLUCOSE-CAPILLARY: 115 mg/dL — AB (ref 70–99)
GLUCOSE-CAPILLARY: 167 mg/dL — AB (ref 70–99)
GLUCOSE-CAPILLARY: 160 mg/dL — AB (ref 70–99)
Glucose-Capillary: 121 mg/dL — ABNORMAL HIGH (ref 70–99)

## 2018-03-29 LAB — MAGNESIUM: Magnesium: 2.1 mg/dL (ref 1.7–2.4)

## 2018-03-29 MED ORDER — PENTAFLUOROPROP-TETRAFLUOROETH EX AERO: 1.0000 "application " | INHALATION_SPRAY | CUTANEOUS | Status: DC | PRN

## 2018-03-29 MED ORDER — SODIUM CHLORIDE 0.9 % IV SOLN: 100.0000 mL | INTRAVENOUS | Status: DC | PRN

## 2018-03-29 MED ORDER — ZOLPIDEM TARTRATE 5 MG PO TABS
5.0000 mg | ORAL_TABLET | Freq: Every evening | ORAL | Status: DC | PRN
  Administered 2018-03-29: 5 mg via ORAL
  Filled 2018-03-29: qty 1

## 2018-03-29 NOTE — Progress Notes (Signed)
   03/29/18 1000  OT Visit Information  Last OT Received On 03/29/18  Reason Eval/Treat Not Completed Patient at procedure or test/ unavailable;Other (comment) (HD)  History of Present Illness Pt is a 67 y/o male admitted secondary to worsening LE swelling and SOB. Likely secondary to acute CHF exacerbation. Pt also with progression of CKD and has started HD during stay. PMH includes COPD, CKD, CVA, OSA on CPAP, LUE AV fistula, and HTN.     Tyrone Schimke, OT Acute [facility] Services Pager: [phone] Office: [phone]

## 2018-03-29 NOTE — Clinical Social Work Note (Addendum)
CSW spoke with HD unit Network engineer. She stated patient is already clipped to an HD center in [place]. CSW left voicemail for [facility] to confirm if patient gets HD there and schedule.  Dayton Scrape, CSW [phone]  5:13 pm Patient's HD schedule while at SNF will be MWF at 12:00. [facility] can accept patient as early as tomorrow if stable. Patient's wife has been notified and will bring his cpap machine to the facility when discharged. CSW paged MD to notify as well.  Dayton Scrape, [place]

## 2018-03-29 NOTE — Progress Notes (Addendum)
PROGRESS NOTE    Gregory Hood  FIE:[id number] DOB: [birth year] DOA: 03/22/2018 PCP: Jilda Panda, MD   Brief Narrative:  67 year old man with hx of COPD, HFpEF, CKD, TIA, CVA, OSA on CPAP and several other medical problems admitted with large volume overload and progression of acute on chronic kidney disease. He c/o chest pressure on presentation, but denied this to me today.  He's had progressive LE edema and SOB.  Nephrology has been consulted and has started HD.  Patient has SNF now ready for social worker and as per nephrology, has outpatient HD center set up so could possibly discharge 10/3.  Assessment & Plan:   Principal Problem:   Acute exacerbation of CHF (congestive heart failure) (HCC) Active Problems:   Elevated troponin   End stage renal disease (HCC)   Thrombocytopenia (HCC)   Normocytic anemia   Metabolic acidosis   Uremia   ESRD with volume overload  Metabolic Acidosis: -Nephrology continues to follow.  Patient received daily hemodialysis for 4 days.  Last treatment was on 10/2.   - lasix 80 po BID added by nephrology - Bicarb d/c'd as pt on dialysis now, also d/c patiromer with initiation of dialysis. -As per nephrology and social work follow-up, seems to have an outpatient HD center set up and SNF bed for admission.  Possible discharge to SNF 10/3.  HFpEF, chronic: Echo 01/2015 with normal EF, grade 2 diastolic dysfunction.  Patient also with pleural effusion. - repeat echo -> notable for concentric LVH, moderate pulm hypertension, mild pericardial effusion (see report) - recommending evaluation for cardiac amyloidosis  - troponins flat - volume per renal with dialysis - cont. Coreg 25mg  bid, amlodipine 10 mg qd, hydralazine 25mg  tid - daily weights - strict I/O's - will need outpatient follow up for pulmonary hypertension and evaluation of cardiac amyloidosis -Patient also on furosemide and decision deferred to nephrology as to when to stop this.  Right  upper extremity edema: follow RUE [place] -> prelim negative  Anemia secondary to chronic kidney disease - B12 (wnl), folate (wnl), iron panel (notable for iron deficiency) - aranesp per renal as well as IV iron - folic acid 2 mg daily - Stable H/H  Thrombocytopenia:unclear etiology, no history of thrombocytopenia.  - blood smear ordered -> path review with normocytic anemia, mild thrombocytopenia - Negative hep B, HIV negative, hepatitis C negative, B12 wnl, folate wnl - Continue to monitor.  Counts are stable.  Left Inguinal Hernia: He went to [facility] on 9/17 due to his hernia pain, and the hernia was successfully reduced in the ED.  Will need continued outpatient follow up.  TIIDM: He had hypoglycemia earlier in hospitalization.  Lantus dc'd and sensitive SSI started.  Mildly uncontrolled and fluctuating.  Essential hypertension: Reasonable control on regimen below.  Continue.  OSA:on home CPAP.  - CPAP ordered.   History of CVA: Stable.  Asa 81 mg  BPH: Stable.  Continue finasteride    DVT prophylaxis: heparin Code Status: full  Family Communication: None at bedside Disposition Plan: Likely DC to SNF 10/3   Consultants:   nephrology  Procedures:  Transthoracic echocardiogram Study Conclusions  - Left ventricle: The cavity size was normal. There was moderate concentric hypertrophy. Systolic function was normal. The estimated ejection fraction was in the range of 55% to 60%. Wall motion was normal; there were no regional wall motion abnormalities. Features are consistent with a pseudonormal left ventricular filling pattern, with concomitant abnormal relaxation and increased filling pressure (grade  2 diastolic dysfunction). Doppler parameters are consistent with elevated ventricular end-diastolic filling pressure. - Mitral valve: Calcified annulus. Moderately thickened leaflets . There was moderate regurgitation. - Left atrium: The  atrium was moderately dilated. - Right atrium: The atrium was severely dilated. - Tricuspid valve: There was moderate regurgitation. - Pulmonary arteries: Systolic pressure was moderately increased. PA peak pressure: 60 mm Hg (S). - Pericardium, extracardiac: A mild circumferential pericardial effusion was identified. There was a large left pleural effusion.  Impressions:  - There is moderate concentric LVH, thickening of the mitral and aortic valves, bilatrial dilatation, mild peicardial and large pleural effusion. Moderate mitral and tricuspid regurgitation, moderate pulmonary hypertension. Evaluation for cardiac amyloidosis is recommended.  Antimicrobials: Anti-infectives (From admission, onward)   None         Subjective: Patient seen after HD.  Denies complaints.  No chest pain or dyspnea reported.  Indicates that his leg swelling slowly improving.  Objective: Vitals:   03/29/18 1030 03/29/18 1100 03/29/18 1130 03/29/18 1152  BP: (!) 152/74 136/68 130/69 (!) 156/66  Pulse: 72 71 69 69  Resp:   19 16  Temp:   98.2 F (36.8 C) 98 F (36.7 C)  TempSrc:   Oral Oral  SpO2:    100%  Weight:    109.6 kg  Height:        Intake/Output Summary (Last 24 hours) at 03/29/2018 1820 Last data filed at 03/29/2018 1312 Gross per 24 hour  Intake 243 ml  Output 4280 ml  Net -4037 ml   Filed Weights   03/29/18 0154 03/29/18 0736 03/29/18 1152  Weight: 115.3 kg 113.6 kg 109.6 kg    Examination:  General: Pleasant middle-aged male, moderately built and obese sitting up comfortably at edge of bed. Cardiovascular: S1 and S2 heard, RRR.  No JVD or murmurs.  2+ pitting bilateral leg edema. Lungs: Clear to auscultation.  No increased work of breathing. Abdomen: Nondistended, soft and nontender.  No organomegaly or masses appreciated.  Normal bowel sounds heard. Neurological: Alert and oriented x3.  No focal neurological deficits. Skin: Warm and dry. No rashes or  lesions. Extremities: No clubbing or cyanosis. Bilateral LEE.  RUE edema. Psychiatric: Mood and affect flat. Insight and judgment are appropriate.   Data Reviewed: I have personally reviewed following labs and imaging studies  CBC: Recent Labs  Lab 03/24/18 0534 03/25/18 0538 03/27/18 0529 03/28/18 0443 03/29/18 0444  WBC 5.1 4.6 5.0 5.4 5.5  HGB 8.1* 8.2* 7.6* 8.1* 8.4*  HCT 24.8* 25.8* 24.7* 25.8* 26.9*  MCV 94.3 94.5 96.5 96.6 96.1  PLT 131* 111* 126* 126* 382*   Basic Metabolic Panel: Recent Labs  Lab 03/22/18 1931  03/23/18 0523 03/24/18 0534 03/25/18 0538 03/27/18 0529 03/28/18 0443 03/29/18 0444  NA  --    < > 135 136 135 135 134* 134*  K  --    < > 4.5 5.0 3.9 4.3 4.0 4.5  CL  --    < > 106 105 102 100 103 100  CO2  --    < > 20* 21* 25 26 27 26   GLUCOSE  --    < > 113* 91 81 52* 110* 117*  BUN  --    < > 84* 72* 51* 49* 37* 45*  CREATININE  --    < > 8.36* 7.30* 6.14* 5.87* 4.94* 5.91*  CALCIUM  --    < > 8.5* 8.5* 8.4* 8.4* 8.1* 8.5*  MG 2.2  --   --  2.2 2.0  --  2.0 2.1  PHOS  --   --  5.8* 5.2* 4.1  --   --   --    < > = values in this interval not displayed.   GFR: Estimated Creatinine Clearance: 15 mL/min (A) (by C-G formula based on SCr of 5.91 mg/dL (H)). Liver Function Tests: Recent Labs  Lab 03/23/18 0523 03/24/18 0534 03/25/18 0538  ALBUMIN 3.0* 2.8* 2.9*   Cardiac Enzymes: Recent Labs  Lab 03/22/18 1931  TROPONINI 0.07*   CBG: Recent Labs  Lab 03/28/18 1617 03/28/18 2123 03/29/18 0719 03/29/18 1300 03/29/18 1621  GLUCAP 129* 178* 121* 115* 167*         Radiology Studies: No results found.      Scheduled Meds: . amLODipine  10 mg Oral Daily  . aspirin  81 mg Oral Daily  . calcitRIOL  0.25 mcg Oral Daily  . carvedilol  25 mg Oral BID WC  . Chlorhexidine Gluconate Cloth  6 each Topical Q0600  . cholecalciferol  2,000 Units Oral Daily  . darbepoetin (ARANESP) injection - DIALYSIS  100 mcg Intravenous Q Fri-HD    . ezetimibe  10 mg Oral Daily  . finasteride  5 mg Oral Daily  . folic acid  2 mg Oral Daily  . furosemide  80 mg Oral BID  . heparin  5,000 Units Subcutaneous Q8H  . hydrALAZINE  25 mg Oral Q8H  . insulin aspart  0-5 Units Subcutaneous QHS  . insulin aspart  0-9 Units Subcutaneous TID WC  . niacin  200 mg Oral QHS  . polyethylene glycol  17 g Oral BID  . prednisoLONE acetate  1 drop Left Eye BID  . senna  2 tablet Oral Daily  . sodium chloride flush  3 mL Intravenous Q12H   Continuous Infusions: . sodium chloride    . sodium chloride    . sodium chloride    . sodium chloride    . ferric gluconate (FERRLECIT/NULECIT) IV 125 mg (03/29/18 1115)     LOS: 7 days    Vernell Leep, MD, FACP, [facility]. [facility] Pager [phone]  If 7PM-7AM, please contact night-coverage [email or web address] Password [facility] 03/29/2018, 6:28 PM

## 2018-03-29 NOTE — Progress Notes (Signed)
Gregory Hood ROUNDING NOTE   Subjective:   Feels fine, seen during dialysis today.  RUE PVL pending but per his report tech said it was negative.   He has [place] HD est    Objective:  Vital signs in last 24 hours:  Temp:  [98.2 F (36.8 C)-98.9 F (37.2 C)] 98.2 F (36.8 C) (10/02 0736) Pulse Rate:  [67-71] 69 (10/02 1000) Resp:  [16-20] 16 (10/02 0736) BP: (123-159)/(52-85) 155/77 (10/02 1000) SpO2:  [98 %-100 %] 100 % (10/02 0547) Weight:  [113.6 kg-115.3 kg] 113.6 kg (10/02 0736)  Weight change: -3.05 kg Filed Weights   03/28/18 0358 03/29/18 0154 03/29/18 0736  Weight: 114.2 kg 115.3 kg 113.6 kg    Intake/Output: I/O last 3 completed shifts: In: 1286 [P.O.:1280; I.V.:6] Out: 396 [Urine:395; Stool:1]   Intake/Output this shift:  No intake/output data recorded.  Gen: Alert and oriented CVS- RRR no murmurs rubs gallops JVP not elevated RS- CTA diminished breath sounds at bases ABD- BS present soft non-distended EXT- 3+ LE edema, RUE still 2+ Skin no lesions or rashes   Basic Metabolic Panel: Recent Labs  Lab 03/22/18 1216 03/22/18 1931 03/23/18 0523 03/24/18 0534 03/25/18 0538 03/27/18 0529 03/28/18 0443 03/29/18 0444  NA 135  --  135 136 135 135 134* 134*  K 5.0  --  4.5 5.0 3.9 4.3 4.0 4.5  CL 108  --  106 105 102 100 103 100  CO2 17*  --  20* 21* 25 26 27 26   GLUCOSE 185*  --  113* 91 81 52* 110* 117*  BUN 99*  --  84* 72* 51* 49* 37* 45*  CREATININE 9.42*  --  8.36* 7.30* 6.14* 5.87* 4.94* 5.91*  CALCIUM 8.5*  --  8.5* 8.5* 8.4* 8.4* 8.1* 8.5*  MG  --  2.2  --  2.2 2.0  --  2.0 2.1  PHOS 6.2*  --  5.8* 5.2* 4.1  --   --   --     Liver Function Tests: Recent Labs  Lab 03/22/18 1216 03/23/18 0523 03/24/18 0534 03/25/18 0538  ALBUMIN 2.8* 3.0* 2.8* 2.9*   No results for input(s): LIPASE, AMYLASE in the last 168 hours. No results for input(s): AMMONIA in the last 168 hours.  CBC: Recent Labs  Lab 03/24/18 0534  03/25/18 0538 03/27/18 0529 03/28/18 0443 03/29/18 0444  WBC 5.1 4.6 5.0 5.4 5.5  HGB 8.1* 8.2* 7.6* 8.1* 8.4*  HCT 24.8* 25.8* 24.7* 25.8* 26.9*  MCV 94.3 94.5 96.5 96.6 96.1  PLT 131* 111* 126* 126* 143*    Cardiac Enzymes: Recent Labs  Lab 03/22/18 1931  TROPONINI 0.07*    BNP: Invalid input(s): POCBNP  CBG: Recent Labs  Lab 03/28/18 0738 03/28/18 1141 03/28/18 1617 03/28/18 2123 03/29/18 0719  GLUCAP 108* 165* 129* 178* 121*    Microbiology: No results found for this or any previous visit.  Coagulation Studies: No results for input(s): LABPROT, INR in the last 72 hours.  Urinalysis: No results for input(s): COLORURINE, LABSPEC, PHURINE, GLUCOSEU, HGBUR, BILIRUBINUR, KETONESUR, PROTEINUR, UROBILINOGEN, NITRITE, LEUKOCYTESUR in the last 72 hours.  Invalid input(s): APPERANCEUR    Imaging: No results found.   Medications:   . sodium chloride    . sodium chloride    . sodium chloride    . sodium chloride    . ferric gluconate (FERRLECIT/NULECIT) IV 125 mg (03/27/18 1417)   . amLODipine  10 mg Oral Daily  . aspirin  81 mg Oral Daily  .  calcitRIOL  0.25 mcg Oral Daily  . carvedilol  25 mg Oral BID WC  . Chlorhexidine Gluconate Cloth  6 each Topical Q0600  . cholecalciferol  2,000 Units Oral Daily  . darbepoetin (ARANESP) injection - DIALYSIS  100 mcg Intravenous Q Fri-HD  . ezetimibe  10 mg Oral Daily  . finasteride  5 mg Oral Daily  . folic acid  2 mg Oral Daily  . furosemide  80 mg Oral BID  . heparin  5,000 Units Subcutaneous Q8H  . hydrALAZINE  25 mg Oral Q8H  . insulin aspart  0-5 Units Subcutaneous QHS  . insulin aspart  0-9 Units Subcutaneous TID WC  . niacin  200 mg Oral QHS  . polyethylene glycol  17 g Oral BID  . prednisoLONE acetate  1 drop Left Eye BID  . senna  2 tablet Oral Daily  . sodium chloride flush  3 mL Intravenous Q12H   sodium chloride, sodium chloride, sodium chloride, sodium chloride, acetaminophen **OR**  acetaminophen, alteplase, heparin, lidocaine (PF), lidocaine-prilocaine, pentafluoroprop-tetrafluoroeth, pentafluoroprop-tetrafluoroeth  Assessment/ Plan:   ESRD:  HD today - will find out what his outpt schedule will be so we can adapt to it here.  UFG for today is 4L and he's been tolerating this well. For his anasarca I have added lasix 80 po BID for now to help with diuresis on non HD days.    RUE edema:  Had doppler yesterday - read pending but per his report it did not show any abnormality.  He feels the edema is improving.  CTM  Secondary hyperparathyroidism continue calcitriol also taking oral cholecalciferol 2000 units daily  Anemia started on Aranesp 03/24/2018 dose 100 mcg to be given qweek; iron saturations 12% On IV iron. Continues on oral folic acid 2 mg daily  Hypertension/volume appears to be doing well with ultrafiltration on dialysis.  Amlodipine 10 mg daily, Coreg 25 mg twice daily, Proscar 5 mg daily, hydralazine 25 mg 3 times daily  Metabolic acidosis now managed with HD  Congestive heart failure with diastolic dysfunction 2D echo seem to indicate possibilities of amyloidosis will need cardiac outpatient follow-up. Managing edema with dialysis and addition of lasix.   Diabetes mellitus as per primary service  Hyperlipidemia niacin 200 mg at bedtime and Zetia 10 mg daily  Mild thrombocytopenia negative eval.  Appears to be mild and not acutely dropping.  Holding heparin with HD.  left inguinal hernia outpatient follow-up reducible in the ER  Obstructive sleep apnea noncompliant with CPAP  History of CVA patient using aspirin 81 mg daily   LOS: 7 Gregory Hood A @TODAY @10 :11 AM

## 2018-03-30 DIAGNOSIS — E872 Acidosis: Secondary | ICD-10-CM

## 2018-03-30 DIAGNOSIS — I5043 Acute on chronic combined systolic (congestive) and diastolic (congestive) heart failure: Secondary | ICD-10-CM

## 2018-03-30 LAB — GLUCOSE, CAPILLARY
GLUCOSE-CAPILLARY: 104 mg/dL — AB (ref 70–99)
GLUCOSE-CAPILLARY: 149 mg/dL — AB (ref 70–99)
GLUCOSE-CAPILLARY: 154 mg/dL — AB (ref 70–99)

## 2018-03-30 MED ORDER — ASPIRIN 81 MG PO TABS: 81.0000 mg | ORAL_TABLET | Freq: Every day | ORAL | Status: DC

## 2018-03-30 MED ORDER — CALCITRIOL 0.25 MCG PO CAPS: 0.2500 ug | ORAL_CAPSULE | Freq: Every day | ORAL | Status: DC

## 2018-03-30 MED ORDER — FUROSEMIDE 80 MG PO TABS: 80.0000 mg | ORAL_TABLET | Freq: Two times a day (BID) | ORAL | Status: DC

## 2018-03-30 MED ORDER — POLYETHYLENE GLYCOL 3350 17 G PO PACK: 17.0000 g | PACK | Freq: Two times a day (BID) | ORAL | Status: DC

## 2018-03-30 MED ORDER — CHOLECALCIFEROL 50 MCG (2000 UT) PO TABS: 2000.0000 [IU] | ORAL_TABLET | Freq: Every day | ORAL | Status: DC

## 2018-03-30 MED ORDER — AMLODIPINE BESYLATE 10 MG PO TABS: 10.0000 mg | ORAL_TABLET | Freq: Every day | ORAL | Status: DC

## 2018-03-30 NOTE — Discharge Instructions (Signed)

## 2018-03-30 NOTE — Progress Notes (Signed)
[facility] ROUNDING NOTE   Subjective:   Feels fine, excited for d/c. RUE PVL still  pending but per his report tech said it was negative.   He has [place] HD est - spoke with RN there MWF Pm   Objective:  Vital signs in last 24 hours:  Temp:  [98 F (36.7 C)-99.3 F (37.4 C)] 98.5 F (36.9 C) (10/03 0610) Pulse Rate:  [69-73] 73 (10/03 0610) Resp:  [16-19] 18 (10/03 0610) BP: (130-156)/(66-82) 155/74 (10/03 0610) SpO2:  [95 %-100 %] 95 % (10/03 0610) Weight:  [109.6 kg-110.9 kg] 110.9 kg (10/03 0618)  Weight change: -1.75 kg Filed Weights   03/29/18 0736 03/29/18 1152 03/30/18 0618  Weight: 113.6 kg 109.6 kg 110.9 kg    Intake/Output: I/O last 3 completed shifts: In: 25 [P.O.:240; I.V.:6] Out: 4380 [Urine:380; Other:4000]   Intake/Output this shift:  No intake/output data recorded.  Gen: Alert and oriented CVS- RRR no murmurs rubs gallops JVP not elevated RS- CTA diminished breath sounds at bases ABD- BS present soft non-distended EXT- 3+ LE edema, RUE still 2+ Skin - no lesions or rashes LUE AVF t/b   Basic Metabolic Panel: Recent Labs  Lab 03/24/18 0534 03/25/18 0538 03/27/18 0529 03/28/18 0443 03/29/18 0444  NA 136 135 135 134* 134*  K 5.0 3.9 4.3 4.0 4.5  CL 105 102 100 103 100  CO2 21* 25 26 27 26   GLUCOSE 91 81 52* 110* 117*  BUN 72* 51* 49* 37* 45*  CREATININE 7.30* 6.14* 5.87* 4.94* 5.91*  CALCIUM 8.5* 8.4* 8.4* 8.1* 8.5*  MG 2.2 2.0  --  2.0 2.1  PHOS 5.2* 4.1  --   --   --     Liver Function Tests: Recent Labs  Lab 03/24/18 0534 03/25/18 0538  ALBUMIN 2.8* 2.9*   No results for input(s): LIPASE, AMYLASE in the last 168 hours. No results for input(s): AMMONIA in the last 168 hours.  CBC: Recent Labs  Lab 03/24/18 0534 03/25/18 0538 03/27/18 0529 03/28/18 0443 03/29/18 0444  WBC 5.1 4.6 5.0 5.4 5.5  HGB 8.1* 8.2* 7.6* 8.1* 8.4*  HCT 24.8* 25.8* 24.7* 25.8* 26.9*  MCV 94.3 94.5 96.5 96.6 96.1  PLT 131*  111* 126* 126* 143*    Cardiac Enzymes: No results for input(s): CKTOTAL, CKMB, CKMBINDEX, TROPONINI in the last 168 hours.  BNP: Invalid input(s): POCBNP  CBG: Recent Labs  Lab 03/29/18 0719 03/29/18 1300 03/29/18 1621 03/29/18 2057 03/30/18 0734  GLUCAP 121* 115* 167* 160* 104*    Microbiology: No results found for this or any previous visit.  Coagulation Studies: No results for input(s): LABPROT, INR in the last 72 hours.  Urinalysis: No results for input(s): COLORURINE, LABSPEC, PHURINE, GLUCOSEU, HGBUR, BILIRUBINUR, KETONESUR, PROTEINUR, UROBILINOGEN, NITRITE, LEUKOCYTESUR in the last 72 hours.  Invalid input(s): APPERANCEUR    Imaging: No results found.   Medications:   . sodium chloride    . sodium chloride    . sodium chloride    . sodium chloride    . ferric gluconate (FERRLECIT/NULECIT) IV 125 mg (03/29/18 1115)   . amLODipine  10 mg Oral Daily  . aspirin  81 mg Oral Daily  . calcitRIOL  0.25 mcg Oral Daily  . carvedilol  25 mg Oral BID WC  . Chlorhexidine Gluconate Cloth  6 each Topical Q0600  . cholecalciferol  2,000 Units Oral Daily  . darbepoetin (ARANESP) injection - DIALYSIS  100 mcg Intravenous Q Fri-HD  . ezetimibe  10 mg Oral Daily  . finasteride  5 mg Oral Daily  . folic acid  2 mg Oral Daily  . furosemide  80 mg Oral BID  . heparin  5,000 Units Subcutaneous Q8H  . hydrALAZINE  25 mg Oral Q8H  . insulin aspart  0-5 Units Subcutaneous QHS  . insulin aspart  0-9 Units Subcutaneous TID WC  . niacin  200 mg Oral QHS  . polyethylene glycol  17 g Oral BID  . prednisoLONE acetate  1 drop Left Eye BID  . senna  2 tablet Oral Daily  . sodium chloride flush  3 mL Intravenous Q12H   sodium chloride, sodium chloride, sodium chloride, sodium chloride, acetaminophen **OR** acetaminophen, alteplase, heparin, lidocaine (PF), lidocaine-prilocaine, pentafluoroprop-tetrafluoroeth, pentafluoroprop-tetrafluoroeth, zolpidem  Assessment/ Plan:   ESRD:   HD MWF [place] confirmed. His EDW will need to be titrated down there over time. For his anasarca I have added lasix 80 po BID for now to help with diuresis on non HD days.  OK for discharge today from my persepctive.  RUE edema:  Had doppler yesterday - read pending but per his report it did not show any abnormality.  He feels the edema is improving.  CTM.  If not improving he may need additional imaging (venogram)  Secondary hyperparathyroidism continue calcitriol also taking oral cholecalciferol 2000 units daily  Anemia started on Aranesp 03/24/2018 dose 100 mcg to be given qweek; iron saturations 12% On IV iron. Continues on oral folic acid 2 mg daily  Hypertension/volume appears to be doing well with ultrafiltration on dialysis.  Amlodipine 10 mg daily, Coreg 25 mg twice daily, Proscar 5 mg daily, hydralazine 25 mg 3 times daily, lasix 80 BID. BPs 140-150s and should improve further was he works towards euvolemia  Metabolic acidosis now managed with HD  Congestive heart failure with diastolic dysfunction 2D echo seem to indicate possibilities of amyloidosis will need cardiac outpatient follow-up. Managing edema with dialysis and addition of lasix.   Diabetes mellitus as per primary service  Hyperlipidemia niacin 200 mg at bedtime and Zetia 10 mg daily  Mild thrombocytopenia negative eval.  Appears to be mild and not acutely dropping.  Holding heparin with HD.  left inguinal hernia outpatient follow-up reducible in the ER  Obstructive sleep apnea noncompliant with CPAP  History of CVA patient using aspirin 81 mg daily   LOS: 8 Emin Foree A @TODAY @10 :19 AM

## 2018-03-30 NOTE — Progress Notes (Signed)
Occupational Therapy Treatment Patient Details Name: Gregory Hood MRN: [id number] DOB: 1950-11-10 Today's Date: 03/30/2018    History of present illness Pt is a 67 y/o male admitted secondary to worsening LE swelling and SOB. Likely secondary to acute CHF exacerbation. Pt also with progression of CKD and has started HD during stay. PMH includes COPD, CKD, CVA, OSA on CPAP, LUE AV fistula, and HTN.    OT comments  Pt awakened for OT, but readily will to get OOB. Pt requiring supervision to min guard assist for bed mobility and transfer to chair. Performed seated grooming and set up for breakfast. Pt needing max assist for LB dressing. Continue to recommend ST rehab in SNF.  Follow Up Recommendations  SNF;Supervision/Assistance - 24 hour    Equipment Recommendations  None recommended by OT    Recommendations for Other Services      Precautions / Restrictions Precautions Precautions: Fall Restrictions Weight Bearing Restrictions: No       Mobility Bed Mobility Overal bed mobility: Needs Assistance Bed Mobility: Supine to Sit     Supine to sit: Supervision;HOB elevated     General bed mobility comments: HOB flat, no rail use  Transfers Overall transfer level: Needs assistance Equipment used: Rolling walker (2 wheeled) Transfers: Sit to/from [facility] Sit to Stand: Min guard;From elevated surface Stand pivot transfers: Min guard;From elevated surface            Balance Overall balance assessment: Needs assistance   Sitting balance-Leahy Scale: Good     Standing balance support: Bilateral upper extremity supported Standing balance-Leahy Scale: Poor                             ADL either performed or assessed with clinical judgement   ADL Overall ADL's : Needs assistance/impaired Eating/Feeding: Independent;Sitting   Grooming: Wash/dry hands;Wash/dry face;Sitting;Set up               Lower Body Dressing: Maximal  assistance;Sit to/from stand Lower Body Dressing Details (indicate cue type and reason): socks                     Vision       Perception     Praxis      Cognition Arousal/Alertness: Awake/alert Behavior During Therapy: WFL for tasks assessed/performed Overall Cognitive Status: No family/caregiver present to determine baseline cognitive functioning                                 General Comments: poor memory, defers decisions wife, decreased problem solving        Exercises     Shoulder Instructions       General Comments      Pertinent Vitals/ Pain       Pain Assessment: No/denies pain  Home Living                                          Prior Functioning/Environment              Frequency  Min 2X/week        Progress Toward Goals  OT Goals(current goals can now be found in the care plan section)  Progress towards OT goals: Progressing toward goals  Acute Rehab OT Goals Patient  Stated Goal: to do whatever his wife says OT Goal Formulation: With patient Time For Goal Achievement: 04/09/18 Potential to Achieve Goals: Good  Plan Discharge plan remains appropriate    Co-evaluation                 AM-PAC PT "6 Clicks" Daily Activity     Outcome Measure   Help from another person eating meals?: None Help from another person taking care of personal grooming?: A Little Help from another person toileting, which includes using toliet, bedpan, or urinal?: A Lot Help from another person bathing (including washing, rinsing, drying)?: A Lot Help from another person to put on and taking off regular upper body clothing?: A Little Help from another person to put on and taking off regular lower body clothing?: A Lot 6 Click Score: 16    End of Session    OT Visit Diagnosis: Unsteadiness on feet (R26.81);Muscle weakness (generalized) (M62.81)   Activity Tolerance Patient tolerated treatment well    Patient Left in chair;with call bell/phone within reach;with chair alarm set   Nurse Communication          Time: 2979-8921 OT Time Calculation (min): 16 min  Charges: OT General Charges $OT Visit: 1 Visit OT Treatments $Self Care/Home Management : 8-22 mins   Malka So 03/30/2018, 10:08 AM  Nestor Lewandowsky, OTR/L [place] Pager: [phone] Office: [phone]

## 2018-03-30 NOTE — Progress Notes (Signed)
Expressed patient and family concerns to the MD about on going swelling. Plan to have Nephrologist see pt  For the final say before discharge.

## 2018-03-30 NOTE — Care Management Important Message (Signed)
Important Message  Patient Details  Name: Gregory Hood MRN: [id number] Date of Birth: February 29, 1951   Medicare Important Message Given:  Yes    Erenest Rasher, RN 03/30/2018, 4:04 PM

## 2018-03-30 NOTE — Clinical Social Work Note (Addendum)
CSW facilitated patient discharge including contacting patient family and facility to confirm patient discharge plans. Clinical information faxed to facility and family agreeable with plan. CSW arranged ambulance transport via [facility] to [facility]. There are many people in front of him on [facility] list and said it would be late before they could pick him up. Patient's wife and RN aware. RN to call report prior to discharge [phone] Room [phone]).  CSW will sign off for now as social work intervention is no longer needed. Please consult [place] again if new needs arise.  Gregory Hood, [place]

## 2018-03-30 NOTE — Clinical Social Work Placement (Signed)
   CLINICAL SOCIAL WORK PLACEMENT  NOTE  Date:  03/30/2018  Patient Details  Name: CHRISTY FRIEDE MRN: [id number] Date of Birth: [birth year]  Clinical Social Work is seeking post-discharge placement for this patient at the [place] level of care (*CSW will initial, date and re-position this form in  chart as items are completed):      Patient/family provided with [place] Work Department's list of facilities offering this level of care within the geographic area requested by the patient (or if unable, by the patient's family).      Patient/family informed of their freedom to choose among providers that offer the needed level of care, that participate in Medicare, Medicaid or managed care program needed by the patient, have an available bed and are willing to accept the patient.      Patient/family informed of [place]'s ownership interest in [facility] and [facility], as well as of the fact that they are under no obligation to receive care at these facilities.  PASRR submitted to EDS on 03/27/18     PASRR number received on       Existing PASRR number confirmed on 03/27/18     FL2 transmitted to all facilities in geographic area requested by pt/family on 03/27/18     FL2 transmitted to all facilities within larger geographic area on       Patient informed that his/her managed care company has contracts with or will negotiate with certain facilities, including the following:        Yes   Patient/family informed of bed offers received.  Patient chooses bed at [facility] and [place] recommends and patient chooses bed at      Patient to be transferred to [facility] on 03/30/18.  Patient to be transferred to facility by [facility]     Patient family notified on 03/30/18 of transfer.  Name of family member notified:  [facility]     PHYSICIAN       Additional Comment:     _______________________________________________ Candie Chroman, LCSW 03/30/2018, 4:18 PM

## 2018-03-30 NOTE — Discharge Summary (Signed)
Physician Discharge Summary  Gregory Hood GXQ:[id number] DOB: 1950-08-26  PCP: Jilda Panda, MD  Admit date: 03/22/2018 Discharge date: 03/30/2018  Recommendations for Outpatient Follow-up:  1. MD at SNF in the next 1 to 2 days. 2. Hemodialysis Center: Continue regular hemodialysis on Mondays, Wednesdays and Fridays.  Next appointment on 03/31/2018.  Periodic lab draws (CBC & renal panel) to be done across dialysis. 3. Dr. Jilda Panda, PCP upon discharge from SNF. 4. Monitor CBGs 3 times daily AC and at bedtime and consider SSI if consistently elevated. 5. Recommend outpatient Cardiology consultation to evaluate for possible cardiac amyloidosis.  Home Health: Patient being discharged to SNF. Equipment/Devices: None.  Discharge Condition: Improved and stable CODE STATUS: Full. Diet recommendation: Heart healthy & diabetic diet.  Discharge Diagnoses:  Principal Problem:   Acute exacerbation of CHF (congestive heart failure) (HCC) Active Problems:   Elevated troponin   End stage renal disease (HCC)   Thrombocytopenia (HCC)   Normocytic anemia   Metabolic acidosis   Uremia   Brief Summary: 67 year old man with hx of COPD, HFpEF, CKD stage V with placement of left upper extremity AV fistula (placed in 2016 but HD not started) and followed by Dr. Dunham/Nephrology, TIA, CVA, OSA on CPAP, COPD, blind in left eye, HLD, DM 2 and several other medical problems admitted with large volume overload and progression of acute on chronic kidney disease. He c/o chest pressure on presentation. He's had progressive LE edema and SOB. Nephrology was consulted and has started HD.    Assessment & Plan:   ESRD with volume overload  Metabolic Acidosis: -Nephrology was consulted and initiated dialysis.  He underwent daily dialysis for a few days.  His last dialysis in the hospital was on 03/29/2018.  Nephrology has seen him and cleared him for discharge to SNF and continued outpatient HD.  I discussed  with Nephrologist today who recommends continuing Lasix and this can be discontinued when deemed appropriate by Nephrology during outpatient follow-up and she indicated that his significant volume overload may take anywhere between 2-4 weeks to slowly resolve.  - Bicarb d/c'd as pt on dialysis now, also d/c patiromer with initiation of dialysis.  HFpEF, chronic: Echo 01/2015 with normal EF, grade 2 diastolic dysfunction.Patient also with pleural effusion. - repeat echo ->notable for concentric LVH, moderate pulm hypertension, mild pericardial effusion (see report) - recommending evaluation for cardiac amyloidosis  - troponins flat - volume per renal with dialysis - cont. Coreg 25mg  bid, amlodipine 10 mg qd, hydralazine 25mg  tid - will need outpatient follow upfor pulmonary hypertension and evaluation of cardiac amyloidosis - Patient also on furosemide and decision deferred to nephrology as to when to stop this.  Right upper extremity edema: RUE venous Doppler 10/1 -> prelim negative for DVT and SVT.  Anemia secondary to chronic kidney disease - B12 (wnl), folate (wnl), iron panel (notable for iron deficiency) - aranesp per renal as well as IV iron.  This can be continued at dialysis by nephrology. - folic acid 2 mg daily - Stable H/H  Thrombocytopenia:unclear etiology, no history of thrombocytopenia.  - blood smear ordered -> path review with normocytic anemia, mild thrombocytopenia - Negative hep B, HIV negative, hepatitis C negative, B12 wnl, folate wnl - Continue to monitor. Counts are stable.  Left Inguinal Hernia: He went to [facility] on 9/17 due to his hernia pain, and the hernia was successfully reduced in the ED. Will need continued outpatient follow up.  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    Currently not on insulins and reasonably controlled as noted below.  Continue to monitor CBGs closely at SNF and consider initiation of hypoglycemics if needed.   Last A1c in 2015 was 7.4.  May consider repeating A1c at SNF.  Essential hypertension: Reasonable control on regimen below.  Continue.  OSA:on home CPAP.  - CPAP ordered.   History of CVA: Stable.  Continue prior home aspirin 81 mg daily and Plavix 75 mg daily.  BPH: Stable. Continue finasteride   Right superficial leg wounds x2: Noted on right mid shin area.  Small and clean without evidence of infection.  Wound care and close monitoring at SNF.   Consultants:   nephrology  Procedures:  Transthoracic echocardiogram Study Conclusions  - Left ventricle: The cavity size was normal. There was moderate concentric hypertrophy. Systolic function was normal. The estimated ejection fraction was in the range of 55% to 60%. Wall motion was normal; there were no regional wall motion abnormalities. Features are consistent with a pseudonormal left ventricular filling pattern, with concomitant abnormal relaxation and increased filling pressure (grade 2 diastolic dysfunction). Doppler parameters are consistent with elevated ventricular end-diastolic filling pressure. - Mitral valve: Calcified annulus. Moderately thickened leaflets . There was moderate regurgitation. - Left atrium: The atrium was moderately dilated. - Right atrium: The atrium was severely dilated. - Tricuspid valve: There was moderate regurgitation. - Pulmonary arteries: Systolic pressure was moderately increased. PA peak pressure: 60 mm Hg (S). - Pericardium, extracardiac: A mild circumferential pericardial effusion was identified. There was a large left pleural effusion.  Impressions:  - There is moderate concentric LVH, thickening of the mitral and aortic valves, bilatrial dilatation, mild peicardial and large pleural effusion. Moderate mitral and tricuspid regurgitation, moderate pulmonary hypertension. Evaluation for cardiac amyloidosis is recommended.  Discharge  Instructions  Discharge Instructions    Call MD for:  difficulty breathing, headache or visual disturbances   Complete by:  As directed    Call MD for:  extreme fatigue   Complete by:  As directed    Call MD for:  persistant dizziness or light-headedness   Complete by:  As directed    Call MD for:  persistant nausea and vomiting   Complete by:  As directed    Call MD for:  redness, tenderness, or signs of infection (pain, swelling, redness, odor or green/yellow discharge around incision site)   Complete by:  As directed    Call MD for:  severe uncontrolled pain   Complete by:  As directed    Call MD for:  temperature >100.4   Complete by:  As directed    Diet - low sodium heart healthy   Complete by:  As directed    Diet Carb Modified   Complete by:  As directed    Increase activity slowly   Complete by:  As directed        Medication List    STOP taking these medications   Lidocaine 4 % Ptch   OVER THE COUNTER MEDICATION   sodium bicarbonate 650 MG tablet   TUMS 500 MG chewable tablet Generic drug:  calcium carbonate     TAKE these medications   acetaminophen 500 MG tablet Commonly known as:  TYLENOL Take 500 mg by mouth every 6 (six) hours as needed for mild pain or moderate pain.   amLODipine 10 MG tablet Commonly known as:  NORVASC Take 1 tablet (10 mg total) by mouth daily. Start taking on:  03/31/2018  aspirin 81 MG tablet Take 1 tablet (81 mg total) by mouth daily. What changed:  when to take this   calcitRIOL 0.25 MCG capsule Commonly known as:  ROCALTROL Take 1 capsule (0.25 mcg total) by mouth daily. What changed:  when to take this   carvedilol 25 MG tablet Commonly known as:  COREG Take 25 mg by mouth 2 (two) times daily.   Cholecalciferol 2000 units Tabs Take 1 tablet (2,000 Units total) by mouth daily. Start taking on:  03/31/2018   clopidogrel 75 MG tablet Commonly known as:  PLAVIX Take 75 mg by mouth daily.   ezetimibe 10 MG  tablet Commonly known as:  ZETIA Take 10 mg by mouth every evening.   finasteride 5 MG tablet Commonly known as:  PROSCAR Take 5 mg by mouth daily.   folic acid 1 MG tablet Commonly known as:  FOLVITE Take 2 tablets (2 mg total) by mouth daily.   furosemide 80 MG tablet Commonly known as:  LASIX Take 1 tablet (80 mg total) by mouth 2 (two) times daily. What changed:    medication strength  how much to take  when to take this   hydrALAZINE 25 MG tablet Commonly known as:  APRESOLINE Take 25 mg by mouth every 8 (eight) hours.   niacin 250 MG tablet Take 250 mg by mouth every evening.   ONETOUCH VERIO test strip Generic drug:  glucose blood   polyethylene glycol packet Commonly known as:  MIRALAX / GLYCOLAX Take 17 g by mouth 2 (two) times daily. What changed:  when to take this   senna-docusate 8.6-50 MG tablet Commonly known as:  Senokot-S Take 2 tablets by mouth daily.       Contact information for follow-up providers    Jilda Panda, MD. Schedule an appointment as soon as possible for a visit.   Specialty:  Internal Medicine Why:  Upon discharge from SNF. Contact information: [address] [place] [address] [phone]        Hemodialysis Center Follow up on 03/31/2018.   Why:  Continue regular hemodialysis appointments on Mondays, Wednesdays and Fridays.  Next appointment on 03/31/2018.       MD at SNF. Schedule an appointment as soon as possible for a visit.   Why:  To be seen in the next 1 to 2 days.  Lab work (CBC & renal panel) should be periodically drawn at hemodialysis.           Contact information for after-discharge care    Destination    [facility] .   Service:  Skilled Nursing Contact information: [address] [place] [address] [phone]                 Allergies  Allergen Reactions  . Bee Venom Swelling  . Lexiscan [Regadenoson] Other (See Comments)     Seizure like-activity after lexiscan given.  . Ace Inhibitors Cough      Procedures/Studies: Dg Chest 2 View  Result Date: 03/22/2018 CLINICAL DATA:  68 year old male with shortness of breath. EXAM: CHEST - 2 VIEW COMPARISON:  Chest radiograph dated 01/22/2018 FINDINGS: There is a small left pleural effusion with left lung base atelectasis. Infiltrate is not excluded. The right lung is clear. No pneumothorax. Stable cardiomegaly. Right pectoral pacemaker device. No acute osseous pathology. IMPRESSION: Small left pleural effusion and left lung base atelectasis versus infiltrate. Electronically Signed   By: Anner Crete M.D.   On: 03/22/2018 01:31  Subjective: Patient denies complaints.  No chest pain, dyspnea, dizziness, lightheadedness or pain elsewhere reported.  Indicates that his extremity edema is slowly improving.  Discharge Exam:  Vitals:   03/29/18 2238 03/30/18 0610 03/30/18 0618 03/30/18 1307  BP: (!) 147/82 (!) 155/74  118/64  Pulse:  73  70  Resp:  18  18  Temp:  98.5 F (36.9 C)  98.9 F (37.2 C)  TempSrc:  Oral  Oral  SpO2:  95%  100%  Weight:   110.9 kg   Height:   5\' 9"  (1.753 m)     General: Pleasant middle-aged male, moderately built and obese sitting up comfortably at edge of bed. Cardiovascular: S1 and S2 heard, RRR.  No JVD or murmurs.  2+ pitting bilateral leg edema. Lungs: Clear to auscultation.  No increased work of breathing. Abdomen: Nondistended, soft and nontender.  No organomegaly or masses appreciated.  Normal bowel sounds heard. Neurological: Alert and oriented x3.  No focal neurological deficits.  Blind left eye. Skin: Warm and dry.  Right lower extremity has 2 small superficial wounds in the mid shin area covered with dressing.  Removed dressing and examined, no acute findings or findings suggestive of infection. Extremities: No clubbing or cyanosis. Bilateral LEE.  RUE edema.  Left upper extremity AV fistula thrill  palpable. Psychiatric: Mood and affect flat. Insight and judgment are appropriate.    The results of significant diagnostics from this hospitalization (including imaging, microbiology, ancillary and laboratory) are listed below for reference.     Labs: CBC: Recent Labs  Lab 03/24/18 0534 03/25/18 0538 03/27/18 0529 03/28/18 0443 03/29/18 0444  WBC 5.1 4.6 5.0 5.4 5.5  HGB 8.1* 8.2* 7.6* 8.1* 8.4*  HCT 24.8* 25.8* 24.7* 25.8* 26.9*  MCV 94.3 94.5 96.5 96.6 96.1  PLT 131* 111* 126* 126* 962*   Basic Metabolic Panel: Recent Labs  Lab 03/24/18 0534 03/25/18 0538 03/27/18 0529 03/28/18 0443 03/29/18 0444  NA 136 135 135 134* 134*  K 5.0 3.9 4.3 4.0 4.5  CL 105 102 100 103 100  CO2 21* 25 26 27 26   GLUCOSE 91 81 52* 110* 117*  BUN 72* 51* 49* 37* 45*  CREATININE 7.30* 6.14* 5.87* 4.94* 5.91*  CALCIUM 8.5* 8.4* 8.4* 8.1* 8.5*  MG 2.2 2.0  --  2.0 2.1  PHOS 5.2* 4.1  --   --   --    Liver Function Tests: Recent Labs  Lab 03/24/18 0534 03/25/18 0538  ALBUMIN 2.8* 2.9*   BNP (last 3 results) Recent Labs    03/22/18 0206  BNP 4,007.2*   CBG: Recent Labs  Lab 03/29/18 1300 03/29/18 1621 03/29/18 2057 03/30/18 0734 03/30/18 1127  GLUCAP 115* 167* 160* 104* 149*     I discussed in detail with patient's spouse.  Updated care and answered questions.  Time coordinating discharge: 50 minutes  SIGNED:  Vernell Leep, MD, FACP, [facility]. [facility] Pager [phone]  If 7PM-7AM, please contact night-coverage [email or web address] Password [email or web address] 03/30/2018, 4:08 PM

## 2018-03-30 NOTE — Progress Notes (Signed)
Physical Therapy Treatment Patient Details Name: Gregory Hood MRN: [id number] DOB: [birth year] Today's Date: 03/30/2018    History of Present Illness Pt is a 67 y/o male admitted secondary to worsening LE swelling and SOB. Likely secondary to acute CHF exacerbation. Pt also with progression of CKD and has started HD during stay. PMH includes COPD, CKD, CVA, OSA on CPAP, LUE AV fistula, and HTN.     PT Comments    Patient session limited in part by agitation, with c/o over lunch not arriving and wanting a coffee. Pt more fatigued this visit as well, requiring min A to stand x3 trials with premature sitting back onto chair. ambulated small distance with close chair follow, agree with SNF recs.     Follow Up Recommendations  SNF     Equipment Recommendations  None recommended by PT    Recommendations for Other Services       Precautions / Restrictions Precautions Precautions: Fall Restrictions Weight Bearing Restrictions: No    Mobility  Bed Mobility Overal bed mobility: Needs Assistance Bed Mobility: Supine to Sit     Supine to sit: Supervision;HOB elevated     General bed mobility comments: in chair at entry  Transfers Overall transfer level: Needs assistance Equipment used: Rolling walker (2 wheeled) Transfers: Sit to/from Stand Sit to Stand: Min assist Stand pivot transfers: Min assist       General transfer comment: pt with more fatigue, min A to stand from chair, cues for hand placement on rails of chair. pt with posterior LOB collapse back onto chair x2 trials.   Ambulation/Gait Ambulation/Gait assistance: Min guard Gait Distance (Feet): 6 Feet Assistive device: Rolling walker (2 wheeled) Gait Pattern/deviations: Step-to pattern Gait velocity: decreased   General Gait Details: patient takig several steps in room wth unsteadiness and need for chair follow. unable to progress much further safely without +2 help and patient was demosntrating unsafe behavior  due to agitation, returned to seated position.    Stairs             Wheelchair Mobility    Modified Rankin (Stroke Patients Only)       Balance Overall balance assessment: Needs assistance   Sitting balance-Leahy Scale: Good     Standing balance support: Bilateral upper extremity supported Standing balance-Leahy Scale: Poor                              Cognition Arousal/Alertness: Awake/alert Behavior During Therapy: WFL for tasks assessed/performed Overall Cognitive Status: No family/caregiver present to determine baseline cognitive functioning                                 General Comments: agitated this morning, tired of people coming into his room.       Exercises      General Comments        Pertinent Vitals/Pain Pain Assessment: No/denies pain    Home Living                      Prior Function            PT Goals (current goals can now be found in the care plan section) Acute Rehab PT Goals Patient Stated Goal: get lunch coffee and soda PT Goal Formulation: With patient Time For Goal Achievement: 04/07/18 Potential to Achieve Goals: Fair Progress towards PT goals:  Progressing toward goals    Frequency    Min 2X/week      PT Plan Current plan remains appropriate    Co-evaluation              AM-PAC PT "6 Clicks" Daily Activity  Outcome Measure  Difficulty turning over in bed (including adjusting bedclothes, sheets and blankets)?: A Little Difficulty moving from lying on back to sitting on the side of the bed? : A Little Difficulty sitting down on and standing up from a chair with arms (e.g., wheelchair, bedside commode, etc,.)?: A Lot Help needed moving to and from a bed to chair (including a wheelchair)?: A Lot Help needed walking in hospital room?: A Lot Help needed climbing 3-5 steps with a railing? : Total 6 Click Score: 13    End of Session Equipment Utilized During Treatment:  Gait belt Activity Tolerance: Patient tolerated treatment well Patient left: in chair;with call bell/phone within reach Nurse Communication: Mobility status PT Visit Diagnosis: Unsteadiness on feet (R26.81);Muscle weakness (generalized) (M62.81);Difficulty in walking, not elsewhere classified (R26.2)     Time: 4097-3532 PT Time Calculation (min) (ACUTE ONLY): 15 min  Charges:  $Therapeutic Activity: 8-22 mins                     Reinaldo Berber, PT, DPT Acute Rehabilitation Services Pager: [phone] Office: [phone]     Reinaldo Berber 03/30/2018, 11:42 AM

## 2018-07-11 ENCOUNTER — Inpatient Hospital Stay (HOSPITAL_COMMUNITY)
Admission: AD | Admit: 2018-07-11 | Discharge: 2018-07-22 | DRG: 981 | Disposition: A | Discharge status: Skilled Nursing Facility | Payer: Medicare Other | Source: Other Acute Inpatient Hospital | Attending: Internal Medicine | Admitting: Internal Medicine

## 2018-07-11 ENCOUNTER — Encounter (HOSPITAL_COMMUNITY): Payer: Self-pay | Admitting: Family Medicine

## 2018-07-11 DIAGNOSIS — E669 Obesity, unspecified: Secondary | ICD-10-CM | POA: Diagnosis present

## 2018-07-11 DIAGNOSIS — N2581 Secondary hyperparathyroidism of renal origin: Secondary | ICD-10-CM | POA: Diagnosis present

## 2018-07-11 DIAGNOSIS — F0150 Vascular dementia without behavioral disturbance: Secondary | ICD-10-CM | POA: Diagnosis not present

## 2018-07-11 DIAGNOSIS — G309 Alzheimer's disease, unspecified: Secondary | ICD-10-CM | POA: Diagnosis not present

## 2018-07-11 DIAGNOSIS — G9340 Encephalopathy, unspecified: Secondary | ICD-10-CM | POA: Diagnosis present

## 2018-07-11 DIAGNOSIS — N186 End stage renal disease: Secondary | ICD-10-CM | POA: Diagnosis not present

## 2018-07-11 DIAGNOSIS — Z992 Dependence on renal dialysis: Secondary | ICD-10-CM | POA: Diagnosis not present

## 2018-07-11 DIAGNOSIS — J449 Chronic obstructive pulmonary disease, unspecified: Secondary | ICD-10-CM | POA: Diagnosis not present

## 2018-07-11 DIAGNOSIS — E1151 Type 2 diabetes mellitus with diabetic peripheral angiopathy without gangrene: Secondary | ICD-10-CM | POA: Diagnosis not present

## 2018-07-11 DIAGNOSIS — H548 Legal blindness, as defined in USA: Secondary | ICD-10-CM | POA: Diagnosis not present

## 2018-07-11 DIAGNOSIS — T82590A Other mechanical complication of surgically created arteriovenous fistula, initial encounter: Secondary | ICD-10-CM

## 2018-07-11 DIAGNOSIS — R569 Unspecified convulsions: Secondary | ICD-10-CM | POA: Diagnosis not present

## 2018-07-11 DIAGNOSIS — G92 Toxic encephalopathy: Secondary | ICD-10-CM | POA: Diagnosis present

## 2018-07-11 DIAGNOSIS — E11319 Type 2 diabetes mellitus with unspecified diabetic retinopathy without macular edema: Secondary | ICD-10-CM | POA: Diagnosis present

## 2018-07-11 DIAGNOSIS — E1122 Type 2 diabetes mellitus with diabetic chronic kidney disease: Secondary | ICD-10-CM | POA: Diagnosis not present

## 2018-07-11 DIAGNOSIS — D631 Anemia in chronic kidney disease: Secondary | ICD-10-CM | POA: Diagnosis not present

## 2018-07-11 DIAGNOSIS — I132 Hypertensive heart and chronic kidney disease with heart failure and with stage 5 chronic kidney disease, or end stage renal disease: Secondary | ICD-10-CM | POA: Diagnosis not present

## 2018-07-11 DIAGNOSIS — Z8673 Personal history of transient ischemic attack (TIA), and cerebral infarction without residual deficits: Secondary | ICD-10-CM

## 2018-07-11 DIAGNOSIS — I160 Hypertensive urgency: Secondary | ICD-10-CM | POA: Diagnosis not present

## 2018-07-11 DIAGNOSIS — Z841 Family history of disorders of kidney and ureter: Secondary | ICD-10-CM

## 2018-07-11 DIAGNOSIS — E785 Hyperlipidemia, unspecified: Secondary | ICD-10-CM | POA: Diagnosis not present

## 2018-07-11 DIAGNOSIS — H269 Unspecified cataract: Secondary | ICD-10-CM | POA: Diagnosis present

## 2018-07-11 DIAGNOSIS — I878 Other specified disorders of veins: Secondary | ICD-10-CM | POA: Diagnosis present

## 2018-07-11 DIAGNOSIS — L97819 Non-pressure chronic ulcer of other part of right lower leg with unspecified severity: Secondary | ICD-10-CM | POA: Diagnosis not present

## 2018-07-11 DIAGNOSIS — Z7982 Long term (current) use of aspirin: Secondary | ICD-10-CM

## 2018-07-11 DIAGNOSIS — Z91041 Radiographic dye allergy status: Secondary | ICD-10-CM

## 2018-07-11 DIAGNOSIS — Z87891 Personal history of nicotine dependence: Secondary | ICD-10-CM

## 2018-07-11 DIAGNOSIS — I5032 Chronic diastolic (congestive) heart failure: Secondary | ICD-10-CM | POA: Diagnosis present

## 2018-07-11 DIAGNOSIS — Z8249 Family history of ischemic heart disease and other diseases of the circulatory system: Secondary | ICD-10-CM

## 2018-07-11 DIAGNOSIS — W1830XA Fall on same level, unspecified, initial encounter: Secondary | ICD-10-CM | POA: Diagnosis present

## 2018-07-11 DIAGNOSIS — G4733 Obstructive sleep apnea (adult) (pediatric): Secondary | ICD-10-CM | POA: Diagnosis not present

## 2018-07-11 DIAGNOSIS — L03115 Cellulitis of right lower limb: Secondary | ICD-10-CM | POA: Diagnosis present

## 2018-07-11 DIAGNOSIS — Z888 Allergy status to other drugs, medicaments and biological substances status: Secondary | ICD-10-CM

## 2018-07-11 DIAGNOSIS — Z91030 Bee allergy status: Secondary | ICD-10-CM

## 2018-07-11 DIAGNOSIS — E1129 Type 2 diabetes mellitus with other diabetic kidney complication: Secondary | ICD-10-CM | POA: Diagnosis present

## 2018-07-11 DIAGNOSIS — F0280 Dementia in other diseases classified elsewhere without behavioral disturbance: Secondary | ICD-10-CM | POA: Diagnosis not present

## 2018-07-11 DIAGNOSIS — Z833 Family history of diabetes mellitus: Secondary | ICD-10-CM

## 2018-07-11 DIAGNOSIS — Z9181 History of falling: Secondary | ICD-10-CM

## 2018-07-11 DIAGNOSIS — Z7902 Long term (current) use of antithrombotics/antiplatelets: Secondary | ICD-10-CM

## 2018-07-11 DIAGNOSIS — L03119 Cellulitis of unspecified part of limb: Secondary | ICD-10-CM | POA: Diagnosis present

## 2018-07-11 LAB — GLUCOSE, CAPILLARY: Glucose-Capillary: 151 mg/dL — ABNORMAL HIGH (ref 70–99)

## 2018-07-11 MED ORDER — HYDROCODONE-ACETAMINOPHEN 5-325 MG PO TABS
1.0000 | ORAL_TABLET | ORAL | Status: DC | PRN
  Administered 2018-07-13 – 2018-07-16 (×3): 1 via ORAL
  Filled 2018-07-11 (×3): qty 1

## 2018-07-11 MED ORDER — HYDRALAZINE HCL 20 MG/ML IJ SOLN
10.0000 mg | INTRAMUSCULAR | Status: DC | PRN
  Administered 2018-07-12: 10 mg via INTRAVENOUS
  Filled 2018-07-11: qty 1

## 2018-07-11 MED ORDER — CLOPIDOGREL BISULFATE 75 MG PO TABS
75.0000 mg | ORAL_TABLET | Freq: Every day | ORAL | Status: DC
  Administered 2018-07-12 – 2018-07-22 (×11): 75 mg via ORAL
  Filled 2018-07-11 (×11): qty 1

## 2018-07-11 MED ORDER — FOLIC ACID 1 MG PO TABS
2.0000 mg | ORAL_TABLET | Freq: Every day | ORAL | Status: DC
  Administered 2018-07-12 – 2018-07-22 (×10): 2 mg via ORAL
  Filled 2018-07-11 (×11): qty 2

## 2018-07-11 MED ORDER — VANCOMYCIN HCL 10 G IV SOLR
2000.0000 mg | Freq: Once | INTRAVENOUS | Status: AC
  Administered 2018-07-11: 2000 mg via INTRAVENOUS
  Filled 2018-07-11: qty 2000

## 2018-07-11 MED ORDER — ACETAMINOPHEN 325 MG PO TABS
650.0000 mg | ORAL_TABLET | Freq: Four times a day (QID) | ORAL | Status: DC | PRN
  Administered 2018-07-12 – 2018-07-17 (×4): 650 mg via ORAL
  Filled 2018-07-11 (×2): qty 2

## 2018-07-11 MED ORDER — SODIUM CHLORIDE 0.9% FLUSH: 3.0000 mL | INTRAVENOUS | Status: DC | PRN

## 2018-07-11 MED ORDER — INSULIN ASPART 100 UNIT/ML ~~LOC~~ SOLN
0.0000 [IU] | Freq: Three times a day (TID) | SUBCUTANEOUS | Status: DC
  Administered 2018-07-13: 1 [IU] via SUBCUTANEOUS
  Administered 2018-07-14: 2 [IU] via SUBCUTANEOUS
  Administered 2018-07-14: 1 [IU] via SUBCUTANEOUS
  Administered 2018-07-15: 2 [IU] via SUBCUTANEOUS
  Administered 2018-07-15 (×2): 1 [IU] via SUBCUTANEOUS
  Administered 2018-07-16: 2 [IU] via SUBCUTANEOUS
  Administered 2018-07-16 – 2018-07-17 (×2): 1 [IU] via SUBCUTANEOUS
  Administered 2018-07-18: 2 [IU] via SUBCUTANEOUS
  Administered 2018-07-18: 5 [IU] via SUBCUTANEOUS
  Administered 2018-07-19: 2 [IU] via SUBCUTANEOUS
  Administered 2018-07-20 – 2018-07-21 (×2): 1 [IU] via SUBCUTANEOUS

## 2018-07-11 MED ORDER — CALCITRIOL 0.25 MCG PO CAPS
0.2500 ug | ORAL_CAPSULE | Freq: Every day | ORAL | Status: DC
  Administered 2018-07-12 – 2018-07-22 (×11): 0.25 ug via ORAL
  Filled 2018-07-11 (×10): qty 1

## 2018-07-11 MED ORDER — ASPIRIN EC 81 MG PO TBEC
81.0000 mg | DELAYED_RELEASE_TABLET | Freq: Every day | ORAL | Status: DC
  Administered 2018-07-12 – 2018-07-22 (×10): 81 mg via ORAL
  Filled 2018-07-11 (×11): qty 1

## 2018-07-11 MED ORDER — ACETAMINOPHEN 650 MG RE SUPP: 650.0000 mg | Freq: Four times a day (QID) | RECTAL | Status: DC | PRN

## 2018-07-11 MED ORDER — HALOPERIDOL LACTATE 5 MG/ML IJ SOLN: 1.0000 mg | Freq: Four times a day (QID) | INTRAMUSCULAR | Status: DC | PRN

## 2018-07-11 MED ORDER — SODIUM CHLORIDE 0.9% FLUSH
3.0000 mL | Freq: Two times a day (BID) | INTRAVENOUS | Status: DC
  Administered 2018-07-11 – 2018-07-13 (×3): 3 mL via INTRAVENOUS
  Administered 2018-07-13: 10 mL via INTRAVENOUS
  Administered 2018-07-14 – 2018-07-17 (×5): 3 mL via INTRAVENOUS

## 2018-07-11 MED ORDER — ONDANSETRON HCL 4 MG/2ML IJ SOLN: 4.0000 mg | Freq: Four times a day (QID) | INTRAMUSCULAR | Status: DC | PRN

## 2018-07-11 MED ORDER — POLYETHYLENE GLYCOL 3350 17 G PO PACK
17.0000 g | PACK | Freq: Two times a day (BID) | ORAL | Status: DC
  Administered 2018-07-12 – 2018-07-22 (×17): 17 g via ORAL
  Filled 2018-07-11 (×18): qty 1

## 2018-07-11 MED ORDER — FUROSEMIDE 80 MG PO TABS
80.0000 mg | ORAL_TABLET | Freq: Two times a day (BID) | ORAL | Status: DC
  Administered 2018-07-11: 80 mg via ORAL
  Filled 2018-07-11: qty 1

## 2018-07-11 MED ORDER — SODIUM CHLORIDE 0.9 % IV SOLN: 250.0000 mL | INTRAVENOUS | Status: DC | PRN

## 2018-07-11 MED ORDER — FINASTERIDE 5 MG PO TABS
5.0000 mg | ORAL_TABLET | Freq: Every day | ORAL | Status: DC
  Administered 2018-07-12 – 2018-07-22 (×11): 5 mg via ORAL
  Filled 2018-07-11 (×11): qty 1

## 2018-07-11 MED ORDER — AMLODIPINE BESYLATE 10 MG PO TABS
10.0000 mg | ORAL_TABLET | Freq: Every day | ORAL | Status: DC
  Filled 2018-07-11: qty 1

## 2018-07-11 MED ORDER — HYDRALAZINE HCL 25 MG PO TABS
25.0000 mg | ORAL_TABLET | Freq: Three times a day (TID) | ORAL | Status: DC
  Administered 2018-07-11 – 2018-07-12 (×2): 25 mg via ORAL
  Filled 2018-07-11 (×2): qty 1

## 2018-07-11 MED ORDER — CARVEDILOL 25 MG PO TABS
25.0000 mg | ORAL_TABLET | Freq: Two times a day (BID) | ORAL | Status: DC
  Administered 2018-07-11 – 2018-07-22 (×20): 25 mg via ORAL
  Filled 2018-07-11 (×20): qty 1

## 2018-07-11 MED ORDER — ONDANSETRON HCL 4 MG PO TABS: 4.0000 mg | ORAL_TABLET | Freq: Four times a day (QID) | ORAL | Status: DC | PRN

## 2018-07-11 MED ORDER — INSULIN ASPART 100 UNIT/ML ~~LOC~~ SOLN: 0.0000 [IU] | Freq: Every day | SUBCUTANEOUS | Status: DC

## 2018-07-11 MED ORDER — EZETIMIBE 10 MG PO TABS
10.0000 mg | ORAL_TABLET | Freq: Every evening | ORAL | Status: DC
  Administered 2018-07-12 – 2018-07-21 (×10): 10 mg via ORAL
  Filled 2018-07-11 (×10): qty 1

## 2018-07-11 MED ORDER — SODIUM CHLORIDE 0.9 % IV SOLN
INTRAVENOUS | Status: DC | PRN
  Administered 2018-07-11: 250 mL via INTRAVENOUS

## 2018-07-11 MED ORDER — SENNOSIDES-DOCUSATE SODIUM 8.6-50 MG PO TABS
2.0000 | ORAL_TABLET | Freq: Every day | ORAL | Status: DC
  Administered 2018-07-12 – 2018-07-22 (×10): 2 via ORAL
  Filled 2018-07-11 (×10): qty 2

## 2018-07-11 MED ORDER — VANCOMYCIN VARIABLE DOSE PER UNSTABLE RENAL FUNCTION (PHARMACIST DOSING): Status: DC

## 2018-07-11 NOTE — Progress Notes (Signed)
Pharmacy Antibiotic Note  Gregory Hood is a 68 y.o. male admitted on 07/11/2018 with RLE cellulitis.  Patient is ESRD with HD MWF per records.  Pharmacy has been consulted for Vancomycin dosing.  Plan: Vancomycin 2gm IV x 1 loading dose. Follow up baseline labs, culture data, HD days.    Temp (24hrs), Avg:98.9 F (37.2 C), Min:98.9 F (37.2 C), Max:98.9 F (37.2 C)  No results for input(s): WBC, CREATININE, LATICACIDVEN, VANCOTROUGH, VANCOPEAK, VANCORANDOM, GENTTROUGH, GENTPEAK, GENTRANDOM, TOBRATROUGH, TOBRAPEAK, TOBRARND, AMIKACINPEAK, AMIKACINTROU, AMIKACIN in the last 168 hours.  CrCl cannot be calculated (Patient's most recent lab result is older than the maximum 21 days allowed.).    Allergies  Allergen Reactions  . Bee Venom Swelling  . Lexiscan [Regadenoson] Other (See Comments)    Seizure like-activity after lexiscan given.  . Ace Inhibitors Cough    Antimicrobials this admission: Vanc 1/14 >>  Dose adjustments this admission:   Microbiology results: pending  Thank you for allowing pharmacy to be a part of this patient's care.  [employer], Pharm.D., BCPS Clinical Pharmacist  **Pharmacist phone directory can now be found on [email or web address] (PW [email or web address]).  Listed under [place].  07/11/2018 10:35 PM

## 2018-07-11 NOTE — H&P (Signed)
History and Physical    Gregory Hood:[id number] DOB: May 23, 1951 DOA: 07/11/2018  PCP: Jilda Panda, MD   Patient coming from: Home  Chief Complaint: Confusion, fever, drainage from right leg   HPI: Gregory Hood is a 68 y.o. male with medical history significant for ESRD, chronic diastolic CHF, hypertension, history of CVA, and chronic right lower extremity wounds, now presenting to the emergency department for evaluation of confusion and fevers.  Patient's wife reported some confusion for roughly 2 days, patient went to dialysis on 07/10/2018, reportedly completed the session, but was noted to be febrile to 102 F with some agitation and possible confusion. Reports non-productive cough and mild SOB. Reports pain and drainage from his chronic right shin ulcer. Denies any lasting effects of his CVA and denies any headache or focal numbness or weakness.   [facility] ED Course: Upon arrival to the ED, patient is found to be have a temperature 99.2 F and was hypertensive to 194/97.  EKG features a sinus rhythm with RAD.  Chest x-ray is notable for cardiomegaly with pulmonary vascular congestion, but no edema or consolidation.  Noncontrast head CT is negative for acute intracranial abnormality.  Chemistry panel features a normal potassium, normal bicarbonate, and BUN of 24.  CBC features a slight normocytic anemia.  Influenza PCR is negative.  Troponin was slightly elevated.  Urinalysis not suggestive of infection.  Patient was given a liter of normal saline, Zyvox, and Haldol in the ED.  He remained hypertensive, but otherwise stable, and transferred to [facility] was arranged for further evaluation and management of fever and confusion suspected secondary to infected chronic right leg wound.         Review of Systems:  All other systems reviewed and apart from HPI, are negative.  Past Medical History:  Diagnosis Date  . Chronic kidney disease   . COPD (chronic obstructive pulmonary  disease) ([place])   . Diabetes mellitus without complication ([place])   . Diabetic retinopathy ([place])   . Hyperlipidemia   . Hypertension   . Obesity   . Orthostatic hypotension   . OSA on CPAP   . Retinal detachment   . Seizures ([place])   . Stroke ([place])   . TIA (transient ischemic attack)   . Umbilical hernia     Past Surgical History:  Procedure Laterality Date  . BASCILIC VEIN TRANSPOSITION Left 03/06/2015   Procedure: LET BASCILIC VEIN TRANSPOSITION;  Surgeon: Angelia Mould, MD;  Location: [place];  Service: Vascular;  Laterality: Left;  . CIRCUMCISION    . EYE SURGERY Bilateral    had surgery several different times.  Marland Kitchen HAMMER TOE SURGERY Left 30 years ago   small toe left toe  . HERNIA REPAIR    . SEPTOPLASTY  20 years ago     reports that he quit smoking about 44 years ago. His smoking use included cigarettes. He has never used smokeless tobacco. He reports that he does not drink alcohol or use drugs.  Allergies  Allergen Reactions  . Bee Venom Swelling  . Lexiscan [Regadenoson] Other (See Comments)    Seizure like-activity after lexiscan given.  . Ace Inhibitors Cough    Family History  Problem Relation Age of Onset  . Hypertension Mother   . Diabetes type II Mother   . Transient ischemic attack Mother   . Kidney disease Mother   . Diabetes Mother   . Varicose Veins Mother   . Hypertension Father   .  Diabetes type II Father   . Diabetes Father   . Diabetes type II Sister   . Hypertension Sister   . Diabetes Sister   . Learning disabilities Maternal Uncle      Prior to Admission medications   Medication Sig Start Date End Date Taking? Authorizing Provider  acetaminophen (TYLENOL) 500 MG tablet Take 500 mg by mouth every 6 (six) hours as needed for mild pain or moderate pain.    [provider]  amLODipine (NORVASC) 10 MG tablet Take 1 tablet (10 mg total) by mouth daily. 03/31/18   Hongalgi, Lenis Dickinson, MD  aspirin 81 MG tablet Take 1 tablet (81 mg  total) by mouth daily. 03/30/18   Hongalgi, Lenis Dickinson, MD  calcitRIOL (ROCALTROL) 0.25 MCG capsule Take 1 capsule (0.25 mcg total) by mouth daily. 03/30/18   Hongalgi, Lenis Dickinson, MD  carvedilol (COREG) 25 MG tablet Take 25 mg by mouth 2 (two) times daily. 01/31/18 01/31/19  [provider]  cholecalciferol 2000 units TABS Take 1 tablet (2,000 Units total) by mouth daily. 03/31/18   Hongalgi, Lenis Dickinson, MD  clopidogrel (PLAVIX) 75 MG tablet Take 75 mg by mouth daily. 03/10/18   [provider]  ezetimibe (ZETIA) 10 MG tablet Take 10 mg by mouth every evening. 02/01/18 02/01/19  [provider]  finasteride (PROSCAR) 5 MG tablet Take 5 mg by mouth daily. 08/23/17 08/23/18  [provider]  folic acid (FOLVITE) 1 MG tablet Take 2 tablets (2 mg total) by mouth daily. 04/03/14   Josue Hector, MD  furosemide (LASIX) 80 MG tablet Take 1 tablet (80 mg total) by mouth 2 (two) times daily. 03/30/18   Hongalgi, Lenis Dickinson, MD  hydrALAZINE (APRESOLINE) 25 MG tablet Take 25 mg by mouth every 8 (eight) hours.     [provider]  niacin 250 MG tablet Take 250 mg by mouth every evening. 02/09/18   [provider]  ONETOUCH VERIO test strip  01/08/14   [provider]  polyethylene glycol (MIRALAX / GLYCOLAX) packet Take 17 g by mouth 2 (two) times daily. 03/30/18   Hongalgi, Lenis Dickinson, MD  senna-docusate (SENOKOT-S) 8.6-50 MG tablet Take 2 tablets by mouth daily. 02/09/18   [provider]    Physical Exam: Vitals:   07/11/18 2117  BP: (!) 202/112  Pulse: 80  Resp: 20  Temp: 98.9 F (37.2 C)  TempSrc: Oral  SpO2: 97%    Constitutional: NAD, calm  Eyes: PERTLA, lids and conjunctivae normal ENMT: Mucous membranes are moist. Posterior pharynx clear of any exudate or lesions.   Neck: normal, supple, no masses, no thyromegaly Respiratory: clear to auscultation bilaterally, no wheezing, no crackles. Normal respiratory effort.   Cardiovascular: S1 & S2 heard,  regular rate and rhythm. Pitting edema bilateral LE's. Abdomen: No distension, no tenderness, soft. Bowel sounds active.  Musculoskeletal: no clubbing / cyanosis. No joint deformity upper and lower extremities.   Skin: Ulceration to lower anterior RLE with thick yellow drainage, surrounding erythema, and tenderness. Warm, dry, well-perfused. Neurologic: No facial asymmetry. Sensation intact. Moving all extremities.  Psychiatric: Oriented to person, place, and situation. Calm.     Labs on Admission: I have personally reviewed following labs and imaging studies  CBC: No results for input(s): WBC, NEUTROABS, HGB, HCT, MCV, PLT in the last 168 hours. Basic Metabolic Panel: No results for input(s): NA, K, CL, CO2, GLUCOSE, BUN, CREATININE, CALCIUM, MG, PHOS in the last 168 hours. GFR: CrCl cannot be calculated (  Patient's most recent lab result is older than the maximum 21 days allowed.). Liver Function Tests: No results for input(s): AST, ALT, ALKPHOS, BILITOT, PROT, ALBUMIN in the last 168 hours. No results for input(s): LIPASE, AMYLASE in the last 168 hours. No results for input(s): AMMONIA in the last 168 hours. Coagulation Profile: No results for input(s): INR, PROTIME in the last 168 hours. Cardiac Enzymes: No results for input(s): CKTOTAL, CKMB, CKMBINDEX, TROPONINI in the last 168 hours. BNP (last 3 results) No results for input(s): PROBNP in the last 8760 hours. HbA1C: No results for input(s): HGBA1C in the last 72 hours. CBG: No results for input(s): GLUCAP in the last 168 hours. Lipid Profile: No results for input(s): CHOL, HDL, LDLCALC, TRIG, CHOLHDL, LDLDIRECT in the last 72 hours. Thyroid Function Tests: No results for input(s): TSH, T4TOTAL, FREET4, T3FREE, THYROIDAB in the last 72 hours. Anemia Panel: No results for input(s): VITAMINB12, FOLATE, FERRITIN, TIBC, IRON, RETICCTPCT in the last 72 hours. Urine analysis:    Component Value Date/Time   COLORURINE YELLOW  09/24/2014 0216   APPEARANCEUR CLOUDY (A) 09/24/2014 0216   LABSPEC 1.018 09/24/2014 0216   PHURINE 5.0 09/24/2014 0216   GLUCOSEU NEGATIVE 09/24/2014 0216   HGBUR NEGATIVE 09/24/2014 0216   BILIRUBINUR NEGATIVE 09/24/2014 0216   KETONESUR NEGATIVE 09/24/2014 0216   PROTEINUR >300 (A) 09/24/2014 0216   UROBILINOGEN 0.2 09/24/2014 0216   NITRITE NEGATIVE 09/24/2014 0216   LEUKOCYTESUR NEGATIVE 09/24/2014 0216   Sepsis Labs: @LABRCNTIP (procalcitonin:4,lacticidven:4) )No results found for this or any previous visit (from the past 240 hour(s)).   Radiological Exams on Admission: No results found.  EKG: Independently reviewed. Sinus rhythm, RAD.   Assessment/Plan  1. Right leg cellulitis  - Presents with fevers, confusion, and new drainage from chronic right leg ulcer  - Afebrile at time of admission with no leukocytosis  - Continue empiric antibiotic with vancomycin for purulent cellulitis with confusion and underlying ESRD; continue wound care   2. Acute encephalopathy  - Patient's wife reported ~2 days of confusion  - Head CT with no acute findings; no focal deficits noted on exam  - Likely toxic-metabolic  - Treat infection as above; check TSH, B12, folate, RPR, ammonia; continue supportive care   3. ESRD - Completed HD on 07/10/18  - No hyperkalemia, uremia, or acidosis on admission; BP is up and he appears hypervolemic but asymptomatic, laying flat during exam  - SLIV, consult with nephrology for maintenance HD if remains in hospital    4. Hypertension with hypertensive urgency  - BP 200/100 range on arrival  - Continue Norvasc, Coreg, and hydralazine  - Use hydralazine IVP's as-needed   5. History of CVA  - Continue ASA and Plavix    6. Chronic diastolic CHF  - Follow daily wt, continue Lasix and beta-blocker, SLIV, consult nephro for maintenance HD if remains in hospital     DVT prophylaxis: sq heparin  Code Status: Full  Family Communication: Discussed with  patient  Consults called: None  Admission status: Observation    Vianne Bulls, MD [facility] Pager [phone]  If 7PM-7AM, please contact night-coverage [email or web address] Password [facility]  07/11/2018, 9:56 PM

## 2018-07-11 NOTE — Progress Notes (Signed)
New Admission Note:   Arrival Method: Patient arrived from [facility] via EMS transport Mental Orientation: Alert and oriented to person,disoriented to place,time and situation. Telemetry: N/A Assessment: Completed Skin: See doc flowsheet IV: L EJ with Zyvox infusing Pain: Denies Tubes: N/A Safety Measures: Safety Fall Prevention Plan has been discussed.  Admission: Completed 5MW Orientation: Patient has been orientated to the room, unit and staff.  Family: None at bedside  Notified [employer] of patient's arrival via text page.Will need admission orders. Call light has been placed within reach and bed alarm has been activated.   Kenyonna Micek [employer], RN-BC Phone number: [phone]

## 2018-07-12 ENCOUNTER — Other Ambulatory Visit: Payer: Self-pay

## 2018-07-12 ENCOUNTER — Observation Stay (HOSPITAL_COMMUNITY): Payer: Medicare Other

## 2018-07-12 DIAGNOSIS — R569 Unspecified convulsions: Secondary | ICD-10-CM | POA: Diagnosis present

## 2018-07-12 DIAGNOSIS — Z992 Dependence on renal dialysis: Secondary | ICD-10-CM | POA: Diagnosis not present

## 2018-07-12 DIAGNOSIS — N2581 Secondary hyperparathyroidism of renal origin: Secondary | ICD-10-CM | POA: Diagnosis present

## 2018-07-12 DIAGNOSIS — I878 Other specified disorders of veins: Secondary | ICD-10-CM | POA: Diagnosis present

## 2018-07-12 DIAGNOSIS — I739 Peripheral vascular disease, unspecified: Secondary | ICD-10-CM | POA: Diagnosis not present

## 2018-07-12 DIAGNOSIS — L03115 Cellulitis of right lower limb: Secondary | ICD-10-CM | POA: Diagnosis present

## 2018-07-12 DIAGNOSIS — I132 Hypertensive heart and chronic kidney disease with heart failure and with stage 5 chronic kidney disease, or end stage renal disease: Secondary | ICD-10-CM | POA: Diagnosis present

## 2018-07-12 DIAGNOSIS — H269 Unspecified cataract: Secondary | ICD-10-CM | POA: Diagnosis present

## 2018-07-12 DIAGNOSIS — I160 Hypertensive urgency: Secondary | ICD-10-CM | POA: Diagnosis present

## 2018-07-12 DIAGNOSIS — G4733 Obstructive sleep apnea (adult) (pediatric): Secondary | ICD-10-CM | POA: Diagnosis present

## 2018-07-12 DIAGNOSIS — I5032 Chronic diastolic (congestive) heart failure: Secondary | ICD-10-CM | POA: Diagnosis present

## 2018-07-12 DIAGNOSIS — N186 End stage renal disease: Secondary | ICD-10-CM | POA: Diagnosis present

## 2018-07-12 DIAGNOSIS — E669 Obesity, unspecified: Secondary | ICD-10-CM | POA: Diagnosis present

## 2018-07-12 DIAGNOSIS — E11319 Type 2 diabetes mellitus with unspecified diabetic retinopathy without macular edema: Secondary | ICD-10-CM | POA: Diagnosis present

## 2018-07-12 DIAGNOSIS — J449 Chronic obstructive pulmonary disease, unspecified: Secondary | ICD-10-CM | POA: Diagnosis present

## 2018-07-12 DIAGNOSIS — W1830XA Fall on same level, unspecified, initial encounter: Secondary | ICD-10-CM | POA: Diagnosis present

## 2018-07-12 DIAGNOSIS — D631 Anemia in chronic kidney disease: Secondary | ICD-10-CM | POA: Diagnosis present

## 2018-07-12 DIAGNOSIS — L03119 Cellulitis of unspecified part of limb: Secondary | ICD-10-CM | POA: Diagnosis present

## 2018-07-12 DIAGNOSIS — L0390 Cellulitis, unspecified: Secondary | ICD-10-CM | POA: Diagnosis not present

## 2018-07-12 DIAGNOSIS — E785 Hyperlipidemia, unspecified: Secondary | ICD-10-CM | POA: Diagnosis present

## 2018-07-12 DIAGNOSIS — E1151 Type 2 diabetes mellitus with diabetic peripheral angiopathy without gangrene: Secondary | ICD-10-CM | POA: Diagnosis present

## 2018-07-12 DIAGNOSIS — F0150 Vascular dementia without behavioral disturbance: Secondary | ICD-10-CM | POA: Diagnosis present

## 2018-07-12 DIAGNOSIS — G309 Alzheimer's disease, unspecified: Secondary | ICD-10-CM | POA: Diagnosis present

## 2018-07-12 DIAGNOSIS — L97819 Non-pressure chronic ulcer of other part of right lower leg with unspecified severity: Secondary | ICD-10-CM | POA: Diagnosis present

## 2018-07-12 DIAGNOSIS — H548 Legal blindness, as defined in USA: Secondary | ICD-10-CM | POA: Diagnosis present

## 2018-07-12 DIAGNOSIS — E1122 Type 2 diabetes mellitus with diabetic chronic kidney disease: Secondary | ICD-10-CM | POA: Diagnosis present

## 2018-07-12 DIAGNOSIS — G92 Toxic encephalopathy: Secondary | ICD-10-CM | POA: Diagnosis present

## 2018-07-12 DIAGNOSIS — F0280 Dementia in other diseases classified elsewhere without behavioral disturbance: Secondary | ICD-10-CM | POA: Diagnosis present

## 2018-07-12 LAB — CBC WITH DIFFERENTIAL/PLATELET
ABS IMMATURE GRANULOCYTES: 0.03 10*3/uL (ref 0.00–0.07)
Basophils Absolute: 0 10*3/uL (ref 0.0–0.1)
Basophils Relative: 0 %
Eosinophils Absolute: 0.1 10*3/uL (ref 0.0–0.5)
Eosinophils Relative: 1 %
HEMATOCRIT: 38 % — AB (ref 39.0–52.0)
Hemoglobin: 12 g/dL — ABNORMAL LOW (ref 13.0–17.0)
Immature Granulocytes: 0 %
Lymphocytes Relative: 5 %
Lymphs Abs: 0.4 10*3/uL — ABNORMAL LOW (ref 0.7–4.0)
MCH: 27.1 pg (ref 26.0–34.0)
MCHC: 31.6 g/dL (ref 30.0–36.0)
MCV: 85.8 fL (ref 80.0–100.0)
Monocytes Absolute: 0.8 10*3/uL (ref 0.1–1.0)
Monocytes Relative: 12 %
NEUTROS ABS: 5.7 10*3/uL (ref 1.7–7.7)
Neutrophils Relative %: 82 %
Platelets: 140 10*3/uL — ABNORMAL LOW (ref 150–400)
RBC: 4.43 MIL/uL (ref 4.22–5.81)
RDW: 17.3 % — ABNORMAL HIGH (ref 11.5–15.5)
WBC: 7 10*3/uL (ref 4.0–10.5)
nRBC: 0 % (ref 0.0–0.2)

## 2018-07-12 LAB — BASIC METABOLIC PANEL
Anion gap: 15 (ref 5–15)
BUN: 28 mg/dL — ABNORMAL HIGH (ref 8–23)
CO2: 28 mmol/L (ref 22–32)
Calcium: 8.6 mg/dL — ABNORMAL LOW (ref 8.9–10.3)
Chloride: 93 mmol/L — ABNORMAL LOW (ref 98–111)
Creatinine, Ser: 5.96 mg/dL — ABNORMAL HIGH (ref 0.61–1.24)
GFR calc Af Amer: 10 mL/min — ABNORMAL LOW (ref >60)
GFR calc non Af Amer: 9 mL/min — ABNORMAL LOW (ref >60)
Glucose, Bld: 131 mg/dL — ABNORMAL HIGH (ref 70–99)
Potassium: 4 mmol/L (ref 3.5–5.1)
Sodium: 136 mmol/L (ref 135–145)

## 2018-07-12 LAB — MRSA PCR SCREENING: MRSA by PCR: NEGATIVE

## 2018-07-12 LAB — GLUCOSE, CAPILLARY
Glucose-Capillary: 104 mg/dL — ABNORMAL HIGH (ref 70–99)
Glucose-Capillary: 111 mg/dL — ABNORMAL HIGH (ref 70–99)

## 2018-07-12 LAB — HEPATITIS B SURFACE ANTIGEN: Hepatitis B Surface Ag: NEGATIVE

## 2018-07-12 LAB — PHOSPHORUS: Phosphorus: 5.1 mg/dL — ABNORMAL HIGH (ref 2.5–4.6)

## 2018-07-12 LAB — VITAMIN B12: Vitamin B-12: 505 pg/mL (ref 180–914)

## 2018-07-12 LAB — RPR: RPR Ser Ql: NONREACTIVE

## 2018-07-12 LAB — HIV ANTIBODY (ROUTINE TESTING W REFLEX): HIV Screen 4th Generation wRfx: NONREACTIVE

## 2018-07-12 LAB — TSH: TSH: 2.504 u[IU]/mL (ref 0.350–4.500)

## 2018-07-12 LAB — AMMONIA: Ammonia: 33 umol/L (ref 9–35)

## 2018-07-12 MED ORDER — HYDROXYZINE HCL 25 MG PO TABS
ORAL_TABLET | ORAL | Status: AC
  Filled 2018-07-12: qty 1

## 2018-07-12 MED ORDER — LABETALOL HCL 5 MG/ML IV SOLN: 10.0000 mg | Freq: Four times a day (QID) | INTRAVENOUS | Status: DC | PRN

## 2018-07-12 MED ORDER — VANCOMYCIN HCL IN DEXTROSE 1-5 GM/200ML-% IV SOLN
INTRAVENOUS | Status: AC
  Administered 2018-07-12: 1000 mg via INTRAVENOUS
  Filled 2018-07-12: qty 200

## 2018-07-12 MED ORDER — WHITE PETROLATUM EX OINT
TOPICAL_OINTMENT | CUTANEOUS | Status: AC
  Filled 2018-07-12: qty 28.35

## 2018-07-12 MED ORDER — VANCOMYCIN HCL IN DEXTROSE 1-5 GM/200ML-% IV SOLN
1000.0000 mg | INTRAVENOUS | Status: DC
  Administered 2018-07-12 – 2018-07-14 (×2): 1000 mg via INTRAVENOUS
  Filled 2018-07-12: qty 200

## 2018-07-12 MED ORDER — HYDRALAZINE HCL 25 MG PO TABS: 25.0000 mg | ORAL_TABLET | ORAL | Status: AC

## 2018-07-12 MED ORDER — HYDROXYZINE HCL 25 MG PO TABS
25.0000 mg | ORAL_TABLET | Freq: Once | ORAL | Status: AC
  Administered 2018-07-12: 17:00:00 via ORAL

## 2018-07-12 MED ORDER — AMLODIPINE BESYLATE 10 MG PO TABS
10.0000 mg | ORAL_TABLET | Freq: Every day | ORAL | Status: DC
  Administered 2018-07-12 – 2018-07-21 (×10): 10 mg via ORAL
  Filled 2018-07-12 (×10): qty 1

## 2018-07-12 MED ORDER — HYDRALAZINE HCL 50 MG PO TABS
50.0000 mg | ORAL_TABLET | Freq: Three times a day (TID) | ORAL | Status: DC
  Administered 2018-07-12 – 2018-07-16 (×10): 50 mg via ORAL
  Filled 2018-07-12 (×10): qty 1

## 2018-07-12 MED ORDER — HEPARIN SODIUM (PORCINE) 1000 UNIT/ML IJ SOLN
INTRAMUSCULAR | Status: AC
  Administered 2018-07-12: 5000 [IU]
  Filled 2018-07-12: qty 5

## 2018-07-12 MED ORDER — CALCITRIOL 0.25 MCG PO CAPS
ORAL_CAPSULE | ORAL | Status: AC
  Administered 2018-07-12: 0.25 ug via ORAL
  Filled 2018-07-12: qty 1

## 2018-07-12 MED ORDER — CHLORHEXIDINE GLUCONATE CLOTH 2 % EX PADS: 6.0000 | MEDICATED_PAD | Freq: Every day | CUTANEOUS | Status: DC

## 2018-07-12 MED ORDER — QUETIAPINE FUMARATE 25 MG PO TABS
25.0000 mg | ORAL_TABLET | Freq: Every day | ORAL | Status: DC
  Administered 2018-07-12: 25 mg via ORAL
  Filled 2018-07-12: qty 1

## 2018-07-12 NOTE — Progress Notes (Signed)
PROGRESS NOTE    Gregory Hood  YKD:[id number] DOB: 1950/10/30 DOA: 07/11/2018 PCP: Jilda Panda, MD   Brief Narrative: 68 year old male with history of ESRD on HD MWF, chronic diastolic CHF, hypertension, history of CVA, chronic lower extremity wounds, Alzheimer dementia, legal blindness lives at home with wife presented to [facility] ER for evaluation of confusion and fevers.  As per wife patient has been confused for roughly 2 days, went to dialysis on 1/13, reportedly completed the session but was noted to have fever of 102 with some agitation and possible confusion.  Patient having mild drainage from his wound on the right shin.  At home patient does relate with assistance. As per the granddaughter he has been having memory issues for past 2 to 3 years and for last 1 week has been worsening, making random calls over phone and not being himself.  In the ER, [facility] EKG with sinus rhythm, chest x-ray pulmonary vascular congestion but no edema or consolidation, CT head negative for acute abnormality, routine lab work CBC fairly stable, influenza PCR negative troponin was borderline positive, UA notes history of infection.1 L normal saline, Zyvox and Haldol in the ED.  He was transferred to [facility].  Assessment & Plan:   Cellulitis of right leg with right shin ulcer w some draiange : Continue on empiric vancomycin. Cont local wound dressing.He does have chronic skin changes, difficult to notice any erythema given the skin discoloration.  Had drainage on admission.  WBC stable 7000, afebrile.  Acute encephalopathy, likely toxic metabolic in the setting of dementia. Family notes that he has memory issues and has been acting unusual for last 1 week.Repeating CT head today unremarkable.  Has PM and MRI may not be feasible.  Overall he is nonfocal.  Ammonia, tsh, B12 Normal.  RPR, hiv pending stable.  Administrator, obtain PT OT. Keep on strict fall precaution.  Suspect he has poor baseline mental  status.  Can try low dose seroquel he is trying to move out of bed and intermittently agitated.  Discussed with his wife who states that he has baseline forgetfulness/dementia but has gotten worse in the past week.  Fall,?  Left forehead injury.  No obvious trauma noted.  Obtaining CT head this am. per Family patient has had falls at home.  Grossly nonfocal on exam.  PT OT, keep on strict fall precaution, bedside sitter.  Patient's granddaughter informed that if patient needs help to bathroom,hospital staff needs to be notified and family should not do givne that he is at fall risk, she verbalized and apologized.  Hypertensive urgency : Likely from not taking medication.  Pressure improving [id number] systolic, continue home regimen, Coreg and hydralazine.  Monitor and adjust medication.  Hopefully dialysis today will help.  DM (diabetes mellitus), type 2 with renal complications: sugar stable, cont ssi./accuchekc monitoring.  Chronic diastolic heart failure : Stable he is on Lasix and will be continued.  Monitor weight, intake output.:  End stage renal disease on HD MWF: Notified nephrology for dialysis today.  History of stroke: Overall nonfocal and stable.  He is on aspirin, Plavix and Zetia.  We will change the patient to inpatient status given his overall presentation with acute encephalopathy. Patient will need at least 2 midnight stay. Obtain PT OT eval likely need SNF placement, wife is in agreement.  DVT prophylaxis: SQ HEPARIN  Code Status: FULL Family Communication: granddaughter at bedside. Wife coming in afternoon. Disposition Plan: PT/OT consulted.Granddaughter hoping him to return home with  wife.   Consultants: Nephrology  Procedures:none  Antimicrobials: Vancomycin 07/11/2018 >>  Subjective: Seen this morning.  Patient's granddaughter at the bedside. Per nursing staff patient was taken to the bathroom by the granddaughter without informing the staff, granddaughter  reports patient was unsteady and she lowered his body and patient fell and hitting on his left side of the head.  Patient does not have any bruise or injury externally, denies any pain. Of note patient was on fall precaution, with fall precaution bracelet.  Objective: Vitals:   07/12/18 0049 07/12/18 0432 07/12/18 0755 07/12/18 0836  BP: (!) 146/85 (!) 194/112 (!) 164/92 (!) 171/101  Pulse: 71 72 74 77  Resp:  20  20  Temp:  97.7 F (36.5 C) 98.9 F (37.2 C) 98 F (36.7 C)  TempSrc:  Oral Oral Oral  SpO2:  98% 98% 97%  Weight: 114 kg     Height:        Intake/Output Summary (Last 24 hours) at 07/12/2018 1119 Last data filed at 07/12/2018 0554 Gross per 24 hour  Intake 860.31 ml  Output 0 ml  Net 860.31 ml   Filed Weights   07/12/18 0049  Weight: 114 kg   Weight change:   Body mass index is 37.11 kg/m.  Examination:  General exam: Appears calm and comfortable, obese, NAD,  HEENT:PERRL,Oral mucosa moist, Ear/Nose normal on gross exam Respiratory system: Bilateral equal air entry, normal vesicular breath sounds, no wheezes or crackles  Cardiovascular system: S1 & S2 heard,No JVD, murmurs.No pedal edema. Gastrointestinal system: Abdomen is  Soft, OBESE, non tender, non distended, BS +  Nervous System:Alert and oriented. No focal neurological deficits/moving extremities. Extremities: no clubbing,distal peripheral pulses palpable. lue AVF+ W thrill. Skin: Chronic hyperpigmentation and skin changes bilateral lower extremities, with skin abrasion/wound on the right shin.  Chronic appearing edema present. MSK: Normal muscle bulk,tone ,power  Medications:  Scheduled Meds: . amLODipine  10 mg Oral QPC supper  . aspirin EC  81 mg Oral Daily  . calcitRIOL  0.25 mcg Oral Daily  . carvedilol  25 mg Oral BID  . Chlorhexidine Gluconate Cloth  6 each Topical Q0600  . clopidogrel  75 mg Oral Daily  . ezetimibe  10 mg Oral QPM  . finasteride  5 mg Oral Daily  . folic acid  2 mg  Oral Daily  . hydrALAZINE  25 mg Oral Q8H  . insulin aspart  0-5 Units Subcutaneous QHS  . insulin aspart  0-9 Units Subcutaneous TID WC  . polyethylene glycol  17 g Oral BID  . senna-docusate  2 tablet Oral Daily  . sodium chloride flush  3 mL Intravenous Q12H  . vancomycin variable dose per unstable renal function (pharmacist dosing)   Does not apply See admin instructions  . white petrolatum       Continuous Infusions: . sodium chloride    . sodium chloride Stopped (07/12/18 0318)    Data Reviewed: I have personally reviewed following labs and imaging studies  CBC: Recent Labs  Lab 07/12/18 0504  WBC 7.0  NEUTROABS 5.7  HGB 12.0*  HCT 38.0*  MCV 85.8  PLT 161*   Basic Metabolic Panel: Recent Labs  Lab 07/12/18 0504  NA 136  K 4.0  CL 93*  CO2 28  GLUCOSE 131*  BUN 28*  CREATININE 5.96*  CALCIUM 8.6*  PHOS 5.1*   GFR: Estimated Creatinine Clearance: 15.2 mL/min (A) (by C-G formula based on SCr of 5.96 mg/dL (H)). Liver Function  Tests: No results for input(s): AST, ALT, ALKPHOS, BILITOT, PROT, ALBUMIN in the last 168 hours. No results for input(s): LIPASE, AMYLASE in the last 168 hours. Recent Labs  Lab 07/11/18 2330  AMMONIA 33   Coagulation Profile: No results for input(s): INR, PROTIME in the last 168 hours. Cardiac Enzymes: No results for input(s): CKTOTAL, CKMB, CKMBINDEX, TROPONINI in the last 168 hours. BNP (last 3 results) No results for input(s): PROBNP in the last 8760 hours. HbA1C: No results for input(s): HGBA1C in the last 72 hours. CBG: Recent Labs  Lab 07/11/18 2220  GLUCAP 151*   Lipid Profile: No results for input(s): CHOL, HDL, LDLCALC, TRIG, CHOLHDL, LDLDIRECT in the last 72 hours. Thyroid Function Tests: Recent Labs    07/11/18 2330  TSH 2.504   Anemia Panel: Recent Labs    07/12/18 0504  VITAMINB12 505   Sepsis Labs: No results for input(s): PROCALCITON, LATICACIDVEN in the last 168 hours.  Recent Results (from  the past 240 hour(s))  MRSA PCR Screening     Status: None   Collection Time: 07/11/18  9:33 PM  Result Value Ref Range Status   MRSA by PCR NEGATIVE NEGATIVE Final    Comment:        The GeneXpert MRSA Assay (FDA approved for NASAL specimens only), is one component of a comprehensive MRSA colonization surveillance program. It is not intended to diagnose MRSA infection nor to guide or monitor treatment for MRSA infections. Performed at [facility], [place] [address] [place], [place] [address]       Radiology Studies: No results found.    LOS: 1 day   Time spent: More than 50% of that time was spent in counseling and/or coordination of care.  Antonieta Pert, MD [facility]  07/12/2018, 11:19 AM

## 2018-07-12 NOTE — Evaluation (Signed)
Physical Therapy Evaluation Patient Details Name: Gregory Hood MRN: [id number] DOB: 1951/05/08 Today's Date: 07/12/2018   History of Present Illness  DECKLIN WEDDINGTON is a 68 y.o. male with medical history significant for ESRD, chronic diastolic CHF, hypertension, history of CVA, and chronic right lower extremity wounds, admitted due to confusion and fevers thought due to R LE cellulitis.  Clinical Impression  Patient presents with decreased independence and safety with mobility due to generalized weakness with acute illness and AMS.  Currently needs mod A for mobility/safety and previously was ambulatory with walker on his own.  Should progress with treatment to return home with family support.  Recommend nursing staff to continue to assist with mobility as pt reports fell yesterday with step daughter's assistance.     Follow Up Recommendations Home health PT;Supervision/Assistance - 24 hour    Equipment Recommendations  None recommended by PT    Recommendations for Other Services       Precautions / Restrictions Precautions Precautions: Fall      Mobility  Bed Mobility Overal bed mobility: Needs Assistance Bed Mobility: Supine to Sit;Sit to Supine     Supine to sit: Supervision;HOB elevated Sit to supine: Min guard   General bed mobility comments: increased time to come upright, but no physical assist, assist to guide feet into bed and for positioning in supine  Transfers Overall transfer level: Needs assistance Equipment used: Rolling walker (2 wheeled) Transfers: Sit to/from Stand Sit to Stand: Mod assist         General transfer comment: declined elevation of bed surface to stand, but needed heavy lifting help and increased time  Ambulation/Gait Ambulation/Gait assistance: Min assist;Mod assist Gait Distance (Feet): 80 Feet Assistive device: Rolling walker (2 wheeled) Gait Pattern/deviations: Step-to pattern;Step-through pattern;Decreased stride length;Shuffle      General Gait Details: increased time and cues/assist to guide walker around obstacles, c/o weakness with ambulation in hallway, but not LOB, still provided increased assist for safety  Stairs            Wheelchair Mobility    Modified Rankin (Stroke Patients Only)       Balance Overall balance assessment: Needs assistance   Sitting balance-Leahy Scale: Good Sitting balance - Comments: bent down to adjust socks in sitting EOB   Standing balance support: Bilateral upper extremity supported Standing balance-Leahy Scale: Poor Standing balance comment: UE support needed, initial posterior bias                             Pertinent Vitals/Pain Pain Assessment: No/denies pain    Home Living Family/patient expects to be discharged to:: Private residence Living Arrangements: Spouse/significant other Available Help at Discharge: Family;Available 24 hours/day Type of Home: House Home Access: Ramped entrance     Home Layout: One level Home Equipment: Walker - 2 wheels;Walker - 4 wheels;Shower seat;Hospital bed      Prior Function Level of Independence: Needs assistance   Gait / Transfers Assistance Needed: walks with walker  ADL's / Homemaking Assistance Needed: wife assists with shower per pt        Hand Dominance   Dominant Hand: Right    Extremity/Trunk Assessment        Lower Extremity Assessment Lower Extremity Assessment: RLE deficits/detail;LLE deficits/detail RLE Deficits / Details: R LE with dressing anterior shin, AROM grossly WFL, but limited AROM altogether with ankle; reports intact sensation; strength overall 4/5 LLE Deficits / Details: AROM grossly WFL; reports  intact sensation; strength overall 4/5       Communication   Communication: No difficulties  Cognition Arousal/Alertness: Awake/alert Behavior During Therapy: WFL for tasks assessed/performed Overall Cognitive Status: Impaired/Different from baseline Area of Impairment:  Problem solving;Safety/judgement;Attention                   Current Attention Level: Selective     Safety/Judgement: Decreased awareness of safety   Problem Solving: Slow processing;Requires verbal cues General Comments: initially lethargic requiring frequent assist to maintain arousal and answer questions.  More appropriate once more alert and sitting EOB, but still slower and with some decreased safety awareness      General Comments General comments (skin integrity, edema, etc.): sitter arrived during session and in room upon end of session    Exercises     Assessment/Plan    PT Assessment Patient needs continued PT services  PT Problem List Decreased strength;Decreased mobility;Decreased safety awareness;Decreased balance;Decreased knowledge of use of DME;Decreased activity tolerance       PT Treatment Interventions DME instruction;Balance training;Patient/family education;Gait training;Therapeutic activities;Functional mobility training;Therapeutic exercise    PT Goals (Current goals can be found in the Care Plan section)  Acute Rehab PT Goals Patient Stated Goal: to get some rest and go home PT Goal Formulation: With patient Time For Goal Achievement: 07/19/18 Potential to Achieve Goals: Good    Frequency Min 3X/week   Barriers to discharge        Co-evaluation               AM-PAC PT "6 Clicks" Mobility  Outcome Measure Help needed turning from your back to your side while in a flat bed without using bedrails?: A Little Help needed moving from lying on your back to sitting on the side of a flat bed without using bedrails?: A Little Help needed moving to and from a bed to a chair (including a wheelchair)?: A Little Help needed standing up from a chair using your arms (e.g., wheelchair or bedside chair)?: A Little Help needed to walk in hospital room?: A Little Help needed climbing 3-5 steps with a railing? : A Little 6 Click Score: 18    End of  Session Equipment Utilized During Treatment: Gait belt Activity Tolerance: Patient limited by fatigue Patient left: in bed;with call bell/phone within reach;with bed alarm set;with nursing/sitter in room   PT Visit Diagnosis: Other abnormalities of gait and mobility (R26.89);Muscle weakness (generalized) (M62.81);Other symptoms and signs involving the nervous system (R29.898)    Time: 2035-5974 PT Time Calculation (min) (ACUTE ONLY): 28 min   Charges:   PT Evaluation $PT Eval Moderate Complexity: 1 Mod PT Treatments $Gait Training: 8-22 mins        Magda Kiel, [place] [facility] [phone] 07/12/2018   Reginia Naas 07/12/2018, 11:32 AM

## 2018-07-12 NOTE — Procedures (Signed)
Patient was seen on dialysis and the procedure was supervised.  BFR 400  Via AVF BP is  high.   Patient appears to be tolerating treatment well- challenge   Gregory Hood 07/12/2018

## 2018-07-12 NOTE — Progress Notes (Signed)
Patient's grand daughter reported that patient fell int the bathroom. Per patient's grand daughter she ambulated the patient to bathroom, he felt dizzy and fell inward towards the commode, hit head to the hand bar, she pulled him back ward and put him on the floor.  Patient was alert and said he still needed to pee, with the help of hoyer lift put the pt in the bed.  Took the VS, notified MD and spouse.  Grand daughter was sorry for taking him to the bathroom and was appreciative for the care.  Took all the fall precautions and ordered bedside sitter as patient was impulsive.

## 2018-07-12 NOTE — Consult Note (Addendum)
[facility] Renal Consultation Note    Indication for Consultation:  Management of ESRD/hemodialysis; anemia, hypertension/volume and secondary hyperparathyroidism  ZOX:WRUEAVW, Carloyn Manner, MD  HPI: Gregory Hood is a 68 y.o. male. ESRD 2/2 DM on HD MWF at [facility], first starting in 01/2018.  Past medical history significant for DMT2, HTN, Hx CVA, HLD, OSA, dementia, chronic lower extremity wounds and cataracts.  Last dialysis was on 1/13, he signed of 1 hour early and left at his dry weight.   Patient seen and examined at bedside with no family present.  Patient is confused and unable to provide history.  When asked why he is here states "I slid and fell on the floor."  Per nursing staff he feel this AM when he tried to go to the bathroom. Denies pain, SOB, CP, n/v/d, dizziness, weakness, and abdominal pain.    Review of epic shows his family brought him in due to fever and increased confusion over the last 2 days.  Pertinent findings include hypertension, flu negative, CXR at [place] with cardiomegaly and pulmonary vascular congestion and CT head with no acute findings.  Repeat CT today after fall in room pending.  He has been admitted for further evaluation and management.   Past Medical History:  Diagnosis Date  . Chronic kidney disease   . COPD (chronic obstructive pulmonary disease) ([place])   . Diabetes mellitus without complication ([place])   . Diabetic retinopathy ([place])   . Hyperlipidemia   . Hypertension   . Obesity   . Orthostatic hypotension   . OSA on CPAP   . Retinal detachment   . Seizures ([place])   . Stroke ([place])   . TIA (transient ischemic attack)   . Umbilical hernia    Past Surgical History:  Procedure Laterality Date  . BASCILIC VEIN TRANSPOSITION Left 03/06/2015   Procedure: LET BASCILIC VEIN TRANSPOSITION;  Surgeon: Angelia Mould, MD;  Location: [facility];  Service: Vascular;  Laterality: Left;  . CIRCUMCISION    . EYE SURGERY Bilateral    had  surgery several different times.  Marland Kitchen HAMMER TOE SURGERY Left 30 years ago   small toe left toe  . HERNIA REPAIR    . SEPTOPLASTY  20 years ago   Family History  Problem Relation Age of Onset  . Hypertension Mother   . Diabetes type II Mother   . Transient ischemic attack Mother   . Kidney disease Mother   . Diabetes Mother   . Varicose Veins Mother   . Hypertension Father   . Diabetes type II Father   . Diabetes Father   . Diabetes type II Sister   . Hypertension Sister   . Diabetes Sister   . Learning disabilities Maternal Uncle    Social History:  reports that he quit smoking about 44 years ago. His smoking use included cigarettes. He has never used smokeless tobacco. He reports that he does not drink alcohol or use drugs. Allergies  Allergen Reactions  . Bee Venom Swelling  . Lexiscan [Regadenoson] Other (See Comments)    Seizure like-activity after lexiscan given.  . Ace Inhibitors Cough   Prior to Admission medications   Medication Sig Start Date End Date Taking? Authorizing Provider  acetaminophen (TYLENOL) 500 MG tablet Take 500 mg by mouth every 6 (six) hours as needed for mild pain or moderate pain.   Yes [provider]  amLODipine (NORVASC) 10 MG tablet Take 1 tablet (10 mg total) by mouth daily. 03/31/18  Yes Hongalgi, Lenis Dickinson, MD  atropine 1 % ophthalmic solution Place 1 drop into the left eye daily.  06/30/18 06/30/19 Yes [provider]  carvedilol (COREG) 25 MG tablet Take 25 mg by mouth 2 (two) times daily. 01/31/18 01/31/19 Yes [provider]  cholecalciferol 2000 units TABS Take 1 tablet (2,000 Units total) by mouth daily. 03/31/18  Yes Hongalgi, Lenis Dickinson, MD  clopidogrel (PLAVIX) 75 MG tablet Take 75 mg by mouth daily. 03/10/18  Yes [provider]  finasteride (PROSCAR) 5 MG tablet Take 5 mg by mouth daily. 08/23/17 08/23/18 Yes [provider]  folic acid (FOLVITE) 1 MG tablet Take 2 tablets (2 mg total) by mouth daily.  04/03/14  Yes Josue Hector, MD  hydrALAZINE (APRESOLINE) 25 MG tablet Take 25 mg by mouth every 8 (eight) hours.    Yes [provider]  polyethylene glycol (MIRALAX / GLYCOLAX) packet Take 17 g by mouth 2 (two) times daily. Patient taking differently: Take 17 g by mouth 2 (two) times daily as needed for mild constipation.  03/30/18  Yes Hongalgi, Lenis Dickinson, MD  Red Yeast Rice 600 MG CAPS Take 1 capsule by mouth daily.   Yes [provider]  senna-docusate (SENOKOT-S) 8.6-50 MG tablet Take 2 tablets by mouth daily as needed for mild constipation.  02/09/18  Yes [provider]  aspirin 81 MG tablet Take 1 tablet (81 mg total) by mouth daily. Patient not taking: Reported on 07/12/2018 03/30/18   Modena Jansky, MD  calcitRIOL (ROCALTROL) 0.25 MCG capsule Take 1 capsule (0.25 mcg total) by mouth daily. Patient not taking: Reported on 07/12/2018 03/30/18   Modena Jansky, MD  furosemide (LASIX) 80 MG tablet Take 1 tablet (80 mg total) by mouth 2 (two) times daily. Patient not taking: Reported on 07/12/2018 03/30/18   Modena Jansky, MD  [facility] VERIO test strip  01/08/14   [provider]   Current Facility-Administered Medications  Medication Dose Route Frequency Provider Last Rate Last Dose  . 0.9 %  sodium chloride infusion  250 mL Intravenous PRN Opyd, Ilene Qua, MD      . 0.9 %  sodium chloride infusion   Intravenous PRN Opyd, Ilene Qua, MD   Stopped at 07/12/18 0318  . acetaminophen (TYLENOL) tablet 650 mg  650 mg Oral Q6H PRN Opyd, Ilene Qua, MD       Or  . acetaminophen (TYLENOL) suppository 650 mg  650 mg Rectal Q6H PRN Opyd, Ilene Qua, MD      . amLODipine (NORVASC) tablet 10 mg  10 mg Oral QPC supper Penninger, Ria Comment, [place]      . aspirin EC tablet 81 mg  81 mg Oral Daily Opyd, Ilene Qua, MD      . calcitRIOL (ROCALTROL) capsule 0.25 mcg  0.25 mcg Oral Daily Opyd, Ilene Qua, MD      . carvedilol (COREG) tablet 25 mg  25 mg Oral BID Opyd, Ilene Qua,  MD   25 mg at 07/11/18 2259  . Chlorhexidine Gluconate Cloth 2 % PADS 6 each  6 each Topical Q0600 Penninger, [place], [place]      . clopidogrel (PLAVIX) tablet 75 mg  75 mg Oral Daily Opyd, Ilene Qua, MD      . ezetimibe (ZETIA) tablet 10 mg  10 mg Oral QPM Opyd, Ilene Qua, MD      . finasteride (PROSCAR) tablet 5 mg  5 mg Oral Daily Opyd, Ilene Qua, MD      .  folic acid (FOLVITE) tablet 2 mg  2 mg Oral Daily Opyd, Ilene Qua, MD      . haloperidol lactate (HALDOL) injection 1 mg  1 mg Intravenous Q6H PRN Opyd, Ilene Qua, MD      . hydrALAZINE (APRESOLINE) injection 10 mg  10 mg Intravenous Q4H PRN Opyd, Ilene Qua, MD      . hydrALAZINE (APRESOLINE) tablet 25 mg  25 mg Oral Q8H Opyd, Ilene Qua, MD   25 mg at 07/12/18 6606  . HYDROcodone-acetaminophen (NORCO/VICODIN) 5-325 MG per tablet 1 tablet  1 tablet Oral Q4H PRN Opyd, Ilene Qua, MD      . insulin aspart (novoLOG) injection 0-5 Units  0-5 Units Subcutaneous QHS Opyd, Timothy S, MD      . insulin aspart (novoLOG) injection 0-9 Units  0-9 Units Subcutaneous TID WC Opyd, Ilene Qua, MD      . ondansetron (ZOFRAN) tablet 4 mg  4 mg Oral Q6H PRN Opyd, Ilene Qua, MD       Or  . ondansetron (ZOFRAN) injection 4 mg  4 mg Intravenous Q6H PRN Opyd, Ilene Qua, MD      . polyethylene glycol (MIRALAX / GLYCOLAX) packet 17 g  17 g Oral BID Opyd, Ilene Qua, MD      . senna-docusate (Senokot-S) tablet 2 tablet  2 tablet Oral Daily Opyd, Ilene Qua, MD      . sodium chloride flush (NS) 0.9 % injection 3 mL  3 mL Intravenous Q12H Opyd, Ilene Qua, MD   3 mL at 07/11/18 2345  . sodium chloride flush (NS) 0.9 % injection 3 mL  3 mL Intravenous PRN Opyd, Ilene Qua, MD      . vancomycin variable dose per unstable renal function (pharmacist dosing)   Does not apply See admin instructions Hammons, Theone Murdoch, RPH      . white petrolatum (VASELINE) gel            Labs: Basic Metabolic Panel: Recent Labs  Lab 07/12/18 0504  NA 136  K 4.0  CL 93*  CO2 28  GLUCOSE  131*  BUN 28*  CREATININE 5.96*  CALCIUM 8.6*  PHOS 5.1*   Liver Function Tests: No results for input(s): AST, ALT, ALKPHOS, BILITOT, PROT, ALBUMIN in the last 168 hours. No results for input(s): LIPASE, AMYLASE in the last 168 hours. Recent Labs  Lab 07/11/18 2330  AMMONIA 33   CBC: Recent Labs  Lab 07/12/18 0504  WBC 7.0  NEUTROABS 5.7  HGB 12.0*  HCT 38.0*  MCV 85.8  PLT 140*   CBG: Recent Labs  Lab 07/11/18 2220  GLUCAP 151*   ROS: All others negative except those listed in HPI.  Physical Exam: Vitals:   07/12/18 0049 07/12/18 0432 07/12/18 0755 07/12/18 0836  BP: (!) 146/85 (!) 194/112 (!) 164/92 (!) 171/101  Pulse: 71 72 74 77  Resp:  20  20  Temp:  97.7 F (36.5 C) 98.9 F (37.2 C) 98 F (36.7 C)  TempSrc:  Oral Oral Oral  SpO2:  98% 98% 97%  Weight: 114 kg     Height:         General: NAD, pleasantly confused male, laying in bed.  When I mention confusion he says 'that's not true" Head: NCAT sclera not icteric, cataract L eye, MMM Neck: Supple. No lymphadenopathy Lungs: CTA bilaterally. No wheeze, rales or rhonchi. Breathing is unlabored. Heart: RRR. No murmur, rubs or gallops.  Abdomen: soft, nontender, +BS, no guarding, no  rebound tenderness Lower extremities:1+ edema, chronic skin changes of b/l LE - dressing on R shin Neuro: Alert, oriented to self. Moves all extremities spontaneously. Psych:  Responds to questions appropriately with a normal affect. Dialysis Access:LU AVF +b/t  Dialysis Orders:  MWF - [place]  4.5hrs, BFR 450, DFR 800,  EDW 108kg, 2K/ 2.25Ca  Access: LU AVF  Heparin 5000 unit bolus Mircera 75 mcg q2wks - last 06/07/18 Calcitriol 0.5 mcg PO qHD  Assessment/Plan: 1.  Acute encephalopahty - unsure of baseline mental function, per family worse than usual over the last week.  Repeat CT due to fall this AM. Per primary. Also concern of infection causing confusion, or high BP ??  2. Cellulitis of R leg w/R shin ulcer - On  ABX. Per primary 3.  ESRD -  On HD MWF.  Orders written for HD today per regular schedule.  4.  Hypertension/volume  - BP elevated.  Continue home meds. LE edema on exam, does not appear grossly volume overloaded.  Plan for HD today for net UF goal 3L. Seems like his EDW needs a challenge  5.  Anemia of CKD - Hgb 12.0. No indication for ESA at this time.  6.  Secondary Hyperparathyroidism -  Ca and phos in goal. Continue VDRA. Not on binders 7.  Nutrition - Renal diet w/fluid restrictions. Vitamins.   Jen Mow, PA-C [facility] Pager: [phone] 07/12/2018, 11:33 AM   Patient seen and examined, agree with above note with above modifications. Pt with ESRD- brought in by family due to fever and confusion- thought to be due to right sided shin wound- HCT neg-  We will continue routine HD while here including today.  High BP as well- hypertensive enceph ?? Will likely need lower EDW at d/c  Corliss Parish, MD 07/12/2018

## 2018-07-13 LAB — GLUCOSE, CAPILLARY
Glucose-Capillary: 115 mg/dL — ABNORMAL HIGH (ref 70–99)
Glucose-Capillary: 124 mg/dL — ABNORMAL HIGH (ref 70–99)
Glucose-Capillary: 95 mg/dL (ref 70–99)
Glucose-Capillary: 143 mg/dL — ABNORMAL HIGH (ref 70–99)

## 2018-07-13 LAB — FOLATE RBC
Folate, Hemolysate: >620 ng/mL
Folate, RBC: >1623 ng/mL (ref >498)
HEMATOCRIT: 38.2 % (ref 37.5–51.0)

## 2018-07-13 LAB — HEPATITIS B SURFACE ANTIBODY,QUALITATIVE: Hep B S Ab: NONREACTIVE

## 2018-07-13 MED ORDER — QUETIAPINE FUMARATE 25 MG PO TABS
12.5000 mg | ORAL_TABLET | Freq: Every day | ORAL | Status: DC
  Administered 2018-07-13 – 2018-07-21 (×9): 12.5 mg via ORAL
  Filled 2018-07-13 (×9): qty 1

## 2018-07-13 MED ORDER — CHLORHEXIDINE GLUCONATE CLOTH 2 % EX PADS
6.0000 | MEDICATED_PAD | Freq: Every day | CUTANEOUS | Status: DC
  Administered 2018-07-13 – 2018-07-15 (×3): 6 via TOPICAL

## 2018-07-13 MED ORDER — ATROPINE SULFATE 1 % OP SOLN
1.0000 [drp] | Freq: Every day | OPHTHALMIC | Status: DC
  Administered 2018-07-13 – 2018-07-21 (×9): 1 [drp] via OPHTHALMIC
  Filled 2018-07-13: qty 2

## 2018-07-13 MED ORDER — PREDNISOLONE ACETATE 1 % OP SUSP
1.0000 [drp] | Freq: Every day | OPHTHALMIC | Status: DC
  Administered 2018-07-13 – 2018-07-21 (×9): 1 [drp] via OPHTHALMIC
  Filled 2018-07-13: qty 5

## 2018-07-13 NOTE — Progress Notes (Signed)
Physical Therapy Treatment Patient Details Name: Gregory Hood MRN: [id number] DOB: [birth year] Today's Date: 07/13/2018    History of Present Illness Gregory Hood is a 68 y.o. male with medical history significant for ESRD, chronic diastolic CHF, hypertension, history of CVA, and chronic right lower extremity wounds, admitted due to confusion and fevers thought due to R LE cellulitis.    PT Comments    Pt in the bed on arrival.  Pt got to EOB with light mod assist and then sat EOB with posterior bias and occasional min assist.  Emphasized sit to stand and progressed to gait.    Follow Up Recommendations  Home health PT;Supervision/Assistance - 24 hour     Equipment Recommendations  None recommended by PT    Recommendations for Other Services       Precautions / Restrictions Precautions Precautions: Fall    Mobility  Bed Mobility Overal bed mobility: Needs Assistance Bed Mobility: Supine to Sit     Supine to sit: Mod assist(low HOB)     General bed mobility comments: Pt reaching for support to powerup trunk to full EOB position.   Transfers Overall transfer level: Needs assistance Equipment used: Rolling walker (2 wheeled) Transfers: Sit to/from Stand Sit to Stand: Mod assist;+2 safety/equipment         General transfer comment: cues for direction and safety.  Assist to come forward more than boosting  Ambulation/Gait Ambulation/Gait assistance: Min assist;Mod assist Gait Distance (Feet): 42 Feet(x2) Assistive device: Rolling walker (2 wheeled) Gait Pattern/deviations: Step-to pattern;Step-through pattern   Gait velocity interpretation: <1.31 ft/sec, indicative of household ambulator General Gait Details: mildly unsteady and following behind the RW much too far unless cued to stay closer.  Pt needs cues for direction, stability assist and help maneuvering/controlling the RW   Stairs             Wheelchair Mobility    Modified Rankin (Stroke Patients  Only)       Balance     Sitting balance-Leahy Scale: Fair Sitting balance - Comments: mild posterior bias     Standing balance-Leahy Scale: Poor Standing balance comment: UE support needed, initial posterior bias                            Cognition Arousal/Alertness: Awake/alert Behavior During Therapy: Restless;Flat affect Overall Cognitive Status: Impaired/Different from baseline Area of Impairment: Problem solving;Safety/judgement;Attention;Orientation;Memory;Following commands;Awareness                 Orientation Level: Place;Time;Situation Current Attention Level: Sustained   Following Commands: Follows one step commands with increased time Safety/Judgement: Decreased awareness of safety;Decreased awareness of deficits Awareness: Intellectual Problem Solving: Slow processing;Decreased initiation;Requires verbal cues        Exercises      General Comments        Pertinent Vitals/Pain Pain Assessment: Faces Faces Pain Scale: No hurt    Home Living                      Prior Function            PT Goals (current goals can now be found in the care plan section) Acute Rehab PT Goals Patient Stated Goal: to get some rest and go home PT Goal Formulation: With patient Time For Goal Achievement: 07/19/18 Potential to Achieve Goals: Good Progress towards PT goals: Progressing toward goals    Frequency    Min 3X/week  PT Plan Current plan remains appropriate    Co-evaluation              AM-PAC PT "6 Clicks" Mobility   Outcome Measure  Help needed turning from your back to your side while in a flat bed without using bedrails?: A Little Help needed moving from lying on your back to sitting on the side of a flat bed without using bedrails?: A Lot Help needed moving to and from a bed to a chair (including a wheelchair)?: A Lot Help needed standing up from a chair using your arms (e.g., wheelchair or bedside  chair)?: A Little Help needed to walk in hospital room?: A Little Help needed climbing 3-5 steps with a railing? : A Lot 6 Click Score: 15    End of Session   Activity Tolerance: Patient limited by fatigue Patient left: in chair;with call bell/phone within reach;with chair alarm set;with nursing/sitter in room Nurse Communication: Mobility status PT Visit Diagnosis: Other abnormalities of gait and mobility (R26.89);Muscle weakness (generalized) (M62.81);Other symptoms and signs involving the nervous system (R29.898)     Time: 3790-2409 PT Time Calculation (min) (ACUTE ONLY): 21 min  Charges:  $Gait Training: 8-22 mins $Therapeutic Activity: 53-67 mins                     07/13/2018  Donnella Sham, PT [place] [phone]  (pager) [phone]  (office)   Tessie Fass Leanthony Rhett 07/13/2018, 3:37 PM

## 2018-07-13 NOTE — Evaluation (Addendum)
Occupational Therapy Evaluation Patient Details Name: Gregory Hood MRN: [id number] DOB: Apr 26, 1951 Today's Date: 07/13/2018    History of Present Illness Gregory Hood is a 68 y.o. male with medical history significant for ESRD, chronic diastolic CHF, hypertension, history of CVA, and chronic right lower extremity wounds, admitted due to confusion and fevers thought due to R LE cellulitis.   Clinical Impression   Pt admitted with the above diagnoses and presents with below problem list. Pt will benefit from continued acute OT to address the below listed deficits and maximize independence with basic ADLs prior to d/c to venue below. From chart review, it appears pt was needing some assist with bathing and suspect he was supervision for functional mobility/transfers using a rw. No family present on OT eval and pt unable to provide reliable history. Pt initially restless and disoriented to place upon OT arrival. Behaviors became more appropriate once OOB and walking in the room. Once pt walking into hallway spontaneously stated, "I'm in the hospital." Pt requesting, and provided with, a chair to sit down on due to legs feeling weak while walking just into the hallway a few feet. Sitter present throughout session. D/c plan based on chart review and assumption of 24 hour available assist at home.     Follow Up Recommendations  Home health OT;Supervision/Assistance - 24 hour    Equipment Recommendations  None recommended by OT    Recommendations for Other Services       Precautions / Restrictions Precautions Precautions: Fall Restrictions Weight Bearing Restrictions: No      Mobility Bed Mobility Overal bed mobility: Needs Assistance Bed Mobility: Supine to Sit     Supine to sit: Mod assist;HOB elevated     General bed mobility comments: Pt reaching for support to powerup trunk to full EOB position.   Transfers Overall transfer level: Needs assistance Equipment used: Rolling walker  (2 wheeled) Transfers: Sit to/from Stand Sit to Stand: Mod assist         General transfer comment: pt asking for help to stand (indicative of baseline assist level?)    Balance Overall balance assessment: Needs assistance   Sitting balance-Leahy Scale: Good       Standing balance-Leahy Scale: Poor Standing balance comment: UE support needed, initial posterior bias                           ADL either performed or assessed with clinical judgement   ADL Overall ADL's : Needs assistance/impaired Eating/Feeding: Minimal assistance;Sitting   Grooming: Moderate assistance;Sitting   Upper Body Bathing: Moderate assistance;Sitting   Lower Body Bathing: Maximal assistance   Upper Body Dressing : Moderate assistance;Sitting   Lower Body Dressing: Maximal assistance   Toilet Transfer: Moderate assistance;Ambulation;Comfort height toilet;BSC;Grab bars;RW   Toileting- Clothing Manipulation and Hygiene: Maximal assistance;Sit to/from stand   Tub/ Banker: Walk-in shower;Moderate assistance;Ambulation;3 in 1;Rolling walker   Functional mobility during ADLs: Minimal assistance;Moderate assistance;Rolling walker General ADL Comments: Pt completed bed mobility, sat briefly EOB then completed functional mobility to a few feet outside of room into the hallway. Pt then verbalizing need to sit down and recliner brought to pt. Pt then completed a second sit<>stand from recliner once back in the room.     Vision   Additional Comments: L eye appears clouded over     Perception     Praxis      Pertinent Vitals/Pain Pain Assessment: No/denies pain  Hand Dominance Right   Extremity/Trunk Assessment Upper Extremity Assessment Upper Extremity Assessment: Generalized weakness   Lower Extremity Assessment Lower Extremity Assessment: Defer to PT evaluation       Communication Communication Communication: No difficulties   Cognition Arousal/Alertness:  Awake/alert Behavior During Therapy: Restless;Flat affect Overall Cognitive Status: Impaired/Different from baseline Area of Impairment: Problem solving;Safety/judgement;Attention;Orientation;Memory;Following commands;Awareness                 Orientation Level: Place;Time;Situation Current Attention Level: Sustained   Following Commands: Follows one step commands consistently;Follows one step commands with increased time Safety/Judgement: Decreased awareness of deficits;Decreased awareness of safety Awareness: Intellectual Problem Solving: Slow processing;Decreased initiation;Requires verbal cues General Comments: Upon arrival of OT pt somewhat restless lying in bed, appeared to be trying to take gown off but also stating that he was cold. When given 2 choices, pt incorrectly stating his location as "home." However, once OOB and standing at door threshold with OT pt spontaneously stating, "I'm in the hospital."    General Comments  sitter present throughout session    Exercises     Shoulder [place] expects to be discharged to:: Private residence Living Arrangements: Spouse/significant other Available Help at Discharge: Family;Available 24 hours/day Type of Home: House Home Access: Ramped entrance     Home Layout: One level     Bathroom Shower/Tub: Tub/shower unit;Curtain   Biochemist, clinical: Standard     Home Equipment: Environmental consultant - 2 wheels;Walker - 4 wheels;Shower seat;Hospital bed          Prior Functioning/Environment Level of Independence: Needs assistance  Gait / Transfers Assistance Needed: walks with walker ADL's / Homemaking Assistance Needed: wife assists with shower per pt   Comments: No family present on OT eval. Pt is questionable historian.        OT Problem List: Decreased strength;Decreased activity tolerance;Impaired balance (sitting and/or standing);Decreased cognition;Decreased safety awareness;Decreased  knowledge of use of DME or AE;Decreased knowledge of precautions;Obesity;Pain      OT Treatment/Interventions:      OT Goals(Current goals can be found in the care plan section) Acute Rehab OT Goals Patient Stated Goal: to get some rest and go home OT Goal Formulation: Patient unable to participate in goal setting Time For Goal Achievement: 07/27/18 Potential to Achieve Goals: Good ADL Goals Pt Will Perform Grooming: with min assist;sitting Pt Will Perform Upper Body Bathing: with min assist;sitting Pt Will Perform Lower Body Bathing: with min assist;sit to/from stand Pt Will Perform Upper Body Dressing: with set-up;sitting Pt Will Perform Lower Body Dressing: with min assist;sit to/from stand Pt Will Transfer to Toilet: with min guard assist;ambulating Pt Will Perform Toileting - Clothing Manipulation and hygiene: with min guard assist;sit to/from stand Additional ADL Goal #1: Pt will complete bed mobility at min guard level to prepare for OOB ADLs.  OT Frequency: Min 3X/week   Barriers to D/C:            Co-evaluation              AM-PAC OT "6 Clicks" Daily Activity     Outcome Measure Help from another person eating meals?: A Little Help from another person taking care of personal grooming?: A Lot Help from another person toileting, which includes using toliet, bedpan, or urinal?: A Lot Help from another person bathing (including washing, rinsing, drying)?: A Lot Help from another person to put on and taking off regular upper body clothing?: A Little Help from another person  to put on and taking off regular lower body clothing?: A Lot 6 Click Score: 14   End of Session Equipment Utilized During Treatment: Surveyor, mining Communication: Mobility status  Activity Tolerance: Patient tolerated treatment well;Patient limited by fatigue Patient left: in chair;with call bell/phone within reach;with chair alarm set;with nursing/sitter in room  OT Visit Diagnosis:  Other abnormalities of gait and mobility (R26.89);Unsteadiness on feet (R26.81);Muscle weakness (generalized) (M62.81);History of falling (Z91.81);Pain;Other symptoms and signs involving cognitive function                Time: 2878-6767 OT Time Calculation (min): 28 min Charges:  OT General Charges $OT Visit: 1 Visit OT Evaluation $OT Eval Moderate Complexity: 1 Mod OT Treatments $Self Care/Home Management : 8-22 mins  Tyrone Schimke, OT Acute Rehabilitation Services Pager: [phone] Office: [phone]   Hortencia Pilar 07/13/2018, 11:29 AM

## 2018-07-13 NOTE — Progress Notes (Signed)
PROGRESS NOTE    Gregory Hood  EHM:[id number] DOB: [birth year] DOA: 07/11/2018 PCP: Jilda Panda, MD   Brief Narrative: 68 year old male with history of ESRD on HD MWF, chronic diastolic CHF, hypertension, history of CVA, chronic lower extremity wounds, Alzheimer dementia, legal blindness lives at home with wife presented to [facility] ER for evaluation of confusion and fevers.  As per wife patient has been confused for roughly 2 days, went to dialysis on 1/13, reportedly completed the session but was noted to have fever of 102 with some agitation and possible confusion.  Patient having mild drainage from his wound on the right shin.  At home patient does relate with assistance. As per the granddaughter he has been having memory issues for past 2 to 3 years and for last 1 week has been worsening, making random calls over phone and not being himself.  [facility] with sinus rhythm, chest x-ray pulmonary vascular congestion but no edema or consolidation, CT head negative for acute abnormality, routine lab work CBC fairly stable, influenza PCR negative troponin was borderline positive, UA notes history of infection.1 L normal saline, Zyvox and Haldol in the ED.  He was transferred to [facility].  Assessment & Plan:   Cellulitis of right leg with right shin ulcer w some draiange : Patient appears to be clinically stable.  Improving.  He is afebrile, has no leukocytosis.  Continue on vancomycin renally dosed.  MRSA screen neg. hopefully discharge on p.o. antibiotics  Acute encephalopathy, likely toxic metabolic in the setting of dementia. Family notes that he has memory issues and has been acting unusual for last 1 week.Repeat CT head is unremarkable for acute finding.  Has PM and MRI may not be feasible.  Today he is somewhat sleepy however is much more oriented.  Improving.  Was placed on Seroquel nightly will cut down the dose to half.  W/u with Ammonia, tsh, B12 Normal.  RPR, hiv  Also neg.  Cont precaution, seizure.  Continue to work with PT OT, anticipating skilled nursing facility however if he improves potentially could discharge home with home health services. His wife was hoping for skilled nursing facility.  Fall,?  Left forehead injury.  No obvious injury on the head and CT head on repeat was unremarkable. Family- esp granddaughter had been  Notified  Not ot move patient to bathroom on their won  Hypertensive urgency : Likely from not taking medication.  BP labile, 366Q to 947M systolic this am. Continue home regimen, Coreg and hydralazine.Monitor and adjust medication.  DM (diabetes mellitus), type 2 with renal complications: sugar stable, cont ssi./accucheck monitoring.  Chronic diastolic heart failure : Stable he is on Lasix and will be continued.  Monitor weight, intake output.:  End stage renal disease on HD MWF: Notified nephrology for dialysis today.  History of stroke: Overall nonfocal and stable.  He is on aspirin, Plavix and Zetia.  CT HEAD x2 unremarkable for acute finding.  DVT prophylaxis: SQ HEPARIN  Code Status: FULL Family Communication: Wife was communicated yesterday.   Disposition Plan: Continue to work with PT OT, discharge patient's wife yesterday.  Home versus skilled nursing facility pending clinical improvement in next 24 hours    Consultants: Nephrology  Procedures: Hemodialysis  Antimicrobials: Vancomycin 07/11/2018 >>  Subjective: Seen and examined. Patient is a much more oriented today than yesterday, able to tell me that he is at [facility], could tell current president, his DOB. He is in the bedside chair,  appears sleepy.  SITTER at bedside. Objective: Vitals:   07/12/18 1848 07/12/18 2001 07/13/18 0500 07/13/18 1123  BP: (!) 193/115 (!) 163/84 129/78 (!) 170/86  Pulse: 82 81 67 76  Resp:  18 18 19   Temp:  99.1 F (37.3 C) 98.7 F (37.1 C) 98.1 F (36.7 C)  TempSrc:  Oral Oral Oral  SpO2:  98% 98% 99%  Weight:        Height:        Intake/Output Summary (Last 24 hours) at 07/13/2018 1127 Last data filed at 07/13/2018 0759 Gross per 24 hour  Intake 440 ml  Output 3011 ml  Net -2571 ml   Filed Weights   07/12/18 0049 07/12/18 1200  Weight: 114 kg 109.4 kg   Weight change: -4.6 kg  Body mass index is 35.62 kg/m.  Examination: General exam: On the bedside chair, sleepy, able to tell me current location current president day HEENT:Oral mucosa moist, Ear/Nose WNL grossly, dentition normal. Respiratory system: Bilateral equal air entry, no crackles and wheezing, no use of accessory muscle. Cardiovascular system: regular rate and rhythm, S1 & S2 heard, No JVD/murmurs.No pedal edema. Gastrointestinal system: Abdomen soft, nontender non-distended, BS +. No hepatosplenomegaly palpable. Nervous System:Alert, awake and oriented at baseline.Able to move UE and LE, sensation intact. Extremities: No edema, distal peripheral pulses palpable.  Skin: No rashes,no icterus.  Neck hyperpigmentation and skin change in the bilateral lower extremities with open ulcer on the right anterior shin with yellowish drainage MSK: Normal muscle bulk,tone ,power Medications:  Scheduled Meds: . amLODipine  10 mg Oral QPC supper  . aspirin EC  81 mg Oral Daily  . calcitRIOL  0.25 mcg Oral Daily  . carvedilol  25 mg Oral BID  . Chlorhexidine Gluconate Cloth  6 each Topical Q0600  . Chlorhexidine Gluconate Cloth  6 each Topical Q0600  . clopidogrel  75 mg Oral Daily  . ezetimibe  10 mg Oral QPM  . finasteride  5 mg Oral Daily  . folic acid  2 mg Oral Daily  . hydrALAZINE  25 mg Oral STAT  . hydrALAZINE  50 mg Oral Q8H  . insulin aspart  0-5 Units Subcutaneous QHS  . insulin aspart  0-9 Units Subcutaneous TID WC  . polyethylene glycol  17 g Oral BID  . QUEtiapine  25 mg Oral QHS  . senna-docusate  2 tablet Oral Daily  . sodium chloride flush  3 mL Intravenous Q12H  . vancomycin variable dose per unstable renal function  (pharmacist dosing)   Does not apply See admin instructions   Continuous Infusions: . sodium chloride    . sodium chloride Stopped (07/12/18 0318)  . vancomycin 1,000 mg (07/12/18 1554)    Data Reviewed: I have personally reviewed following labs and imaging studies  CBC: Recent Labs  Lab 07/12/18 0504  WBC 7.0  NEUTROABS 5.7  HGB 12.0*  HCT 38.0*  MCV 85.8  PLT 469*   Basic Metabolic Panel: Recent Labs  Lab 07/12/18 0504  NA 136  K 4.0  CL 93*  CO2 28  GLUCOSE 131*  BUN 28*  CREATININE 5.96*  CALCIUM 8.6*  PHOS 5.1*   GFR: Estimated Creatinine Clearance: 14.9 mL/min (A) (by C-G formula based on SCr of 5.96 mg/dL (H)). Liver Function Tests: No results for input(s): AST, ALT, ALKPHOS, BILITOT, PROT, ALBUMIN in the last 168 hours. No results for input(s): LIPASE, AMYLASE in the last 168 hours. Recent Labs  Lab 07/11/18 2330  AMMONIA 33  Coagulation Profile: No results for input(s): INR, PROTIME in the last 168 hours. Cardiac Enzymes: No results for input(s): CKTOTAL, CKMB, CKMBINDEX, TROPONINI in the last 168 hours. BNP (last 3 results) No results for input(s): PROBNP in the last 8760 hours. HbA1C: No results for input(s): HGBA1C in the last 72 hours. CBG: Recent Labs  Lab 07/11/18 2220 07/12/18 1745 07/12/18 2136 07/13/18 0825  GLUCAP 151* 104* 111* 115*   Lipid Profile: No results for input(s): CHOL, HDL, LDLCALC, TRIG, CHOLHDL, LDLDIRECT in the last 72 hours. Thyroid Function Tests: Recent Labs    07/11/18 2330  TSH 2.504   Anemia Panel: Recent Labs    07/12/18 0504  VITAMINB12 505   Sepsis Labs: No results for input(s): PROCALCITON, LATICACIDVEN in the last 168 hours.  Recent Results (from the past 240 hour(s))  MRSA PCR Screening     Status: None   Collection Time: 07/11/18  9:33 PM  Result Value Ref Range Status   MRSA by PCR NEGATIVE NEGATIVE Final    Comment:        The GeneXpert MRSA Assay (FDA approved for NASAL  specimens only), is one component of a comprehensive MRSA colonization surveillance program. It is not intended to diagnose MRSA infection nor to guide or monitor treatment for MRSA infections. Performed at [facility], [place] [address] [place], [place] [address]       Radiology Studies: Ct Head Wo Contrast  Result Date: 07/12/2018 CLINICAL DATA:  68 year old who presented this admission with fever and confusion. Patient fell yesterday and has ataxia. EXAM: CT HEAD WITHOUT CONTRAST TECHNIQUE: Contiguous axial images were obtained from the base of the skull through the vertex without intravenous contrast. COMPARISON:  07/10/2018, 01/21/2018 and earlier. FINDINGS: Patient motion blurred images of the skull base but the study does appear diagnostic. Brain: Moderate cortical, deep and cerebellar atrophy, unchanged. Severe changes of small vessel disease of the white matter diffusely, unchanged. Old lacunar strokes involving the basal ganglia bilaterally, unchanged. Physiologic calcifications in the basal ganglia, unchanged. No mass lesion. No midline shift. No acute hemorrhage or hematoma. No extra-axial fluid collections. No evidence of acute infarction. No new/acute abnormalities involving the brain since the examination 2 days ago. Vascular: Severe BILATERAL carotid siphon and LEFT vertebral artery atherosclerosis. No hyperdense vessel. Skull: No skull fracture or other focal osseous abnormality involving the skull. Sinuses/Orbits: Visualized paranasal sinuses, BILATERAL mastoid air cells and BILATERAL middle ear cavities well-aerated. LEFT phthisis bulbi. Normal-appearing RIGHT orbit and globe. Other: None. IMPRESSION: 1. No acute intracranial abnormality. 2. Stable moderate generalized atrophy and severe chronic microvascular ischemic changes of the white matter. Electronically Signed   By: Evangeline Dakin M.D.   On: 07/12/2018 11:53      LOS: 2 days   Time spent: More than 50% of that  time was spent in counseling and/or coordination of care.  Antonieta Pert, MD [facility]  07/13/2018, 11:27 AM

## 2018-07-13 NOTE — Progress Notes (Addendum)
[facility] Progress Note   Subjective:   Seen and examined at bedside.  Tired today, wakes to answer questions and goes back to sleep.  Oriented to person and year.  Denies pain in RLE but winces when I touch it.  Denies n/v/d, CP, SOB, and abdominal pain. Had HD yest- removed 3000- BP remained high until this AM  Objective Vitals:   07/12/18 1744 07/12/18 1848 07/12/18 2001 07/13/18 0500  BP: (!) 174/90 (!) 193/115 (!) 163/84 129/78  Pulse: 76 82 81 67  Resp: 18  18 18   Temp: 99.1 F (37.3 C)  99.1 F (37.3 C) 98.7 F (37.1 C)  TempSrc: Oral  Oral Oral  SpO2: 97%  98% 98%  Weight:      Height:       Physical Exam General:NAD, chronically ill appearing male, resting in bed Heart:RRR, no mrg Lungs:CTAB anteriorly  Abdomen:soft, NT, ND Extremities:1+ BLE edema, chronic skin changes to BLE, dressing on R shin Dialysis Access: LU AVF +b/t  Neuro: oriented to self and year, Place: Tia [place], [place] Weights   07/12/18 0049 07/12/18 1200  Weight: 114 kg 109.4 kg    Intake/Output Summary (Last 24 hours) at 07/13/2018 0814 Last data filed at 07/13/2018 0759 Gross per 24 hour  Intake 440 ml  Output 3011 ml  Net -2571 ml    Additional Objective Labs: Basic Metabolic Panel: Recent Labs  Lab 07/12/18 0504  NA 136  K 4.0  CL 93*  CO2 28  GLUCOSE 131*  BUN 28*  CREATININE 5.96*  CALCIUM 8.6*  PHOS 5.1*   CBC: Recent Labs  Lab 07/12/18 0504  WBC 7.0  NEUTROABS 5.7  HGB 12.0*  HCT 38.0*  MCV 85.8  PLT 140*   CBG: Recent Labs  Lab 07/11/18 2220 07/12/18 1745 07/12/18 2136  GLUCAP 151* 104* 111*   Studies/Results: Ct Head Wo Contrast  Result Date: 07/12/2018 CLINICAL DATA:  68 year old who presented this admission with fever and confusion. Patient fell yesterday and has ataxia. EXAM: CT HEAD WITHOUT CONTRAST TECHNIQUE: Contiguous axial images were obtained from the base of the skull through the vertex without intravenous contrast.  COMPARISON:  07/10/2018, 01/21/2018 and earlier. FINDINGS: Patient motion blurred images of the skull base but the study does appear diagnostic. Brain: Moderate cortical, deep and cerebellar atrophy, unchanged. Severe changes of small vessel disease of the white matter diffusely, unchanged. Old lacunar strokes involving the basal ganglia bilaterally, unchanged. Physiologic calcifications in the basal ganglia, unchanged. No mass lesion. No midline shift. No acute hemorrhage or hematoma. No extra-axial fluid collections. No evidence of acute infarction. No new/acute abnormalities involving the brain since the examination 2 days ago. Vascular: Severe BILATERAL carotid siphon and LEFT vertebral artery atherosclerosis. No hyperdense vessel. Skull: No skull fracture or other focal osseous abnormality involving the skull. Sinuses/Orbits: Visualized paranasal sinuses, BILATERAL mastoid air cells and BILATERAL middle ear cavities well-aerated. LEFT phthisis bulbi. Normal-appearing RIGHT orbit and globe. Other: None. IMPRESSION: 1. No acute intracranial abnormality. 2. Stable moderate generalized atrophy and severe chronic microvascular ischemic changes of the white matter. Electronically Signed   By: Evangeline Dakin M.D.   On: 07/12/2018 11:53    Medications: . sodium chloride    . sodium chloride Stopped (07/12/18 0318)  . vancomycin 1,000 mg (07/12/18 1554)   . amLODipine  10 mg Oral QPC supper  . aspirin EC  81 mg Oral Daily  . calcitRIOL  0.25 mcg Oral Daily  . carvedilol  25  mg Oral BID  . Chlorhexidine Gluconate Cloth  6 each Topical Q0600  . clopidogrel  75 mg Oral Daily  . ezetimibe  10 mg Oral QPM  . finasteride  5 mg Oral Daily  . folic acid  2 mg Oral Daily  . hydrALAZINE  25 mg Oral STAT  . hydrALAZINE  50 mg Oral Q8H  . insulin aspart  0-5 Units Subcutaneous QHS  . insulin aspart  0-9 Units Subcutaneous TID WC  . polyethylene glycol  17 g Oral BID  . QUEtiapine  25 mg Oral QHS  .  senna-docusate  2 tablet Oral Daily  . sodium chloride flush  3 mL Intravenous Q12H  . vancomycin variable dose per unstable renal function (pharmacist dosing)   Does not apply See admin instructions    Dialysis Orders: MWF - [place]  4.5hrs, BFR 450, DFR 800,  EDW 108kg, 2K/ 2.25Ca  Access: LU AVF  Heparin 5000 unit bolus Mircera 75 mcg q2wks - last 06/07/18 Calcitriol 0.5 mcg PO qHD  Assessment/Plan: 1. Acute encephalopahty - infection vs. HTN. Labs reassuring. RPR negative. unsure of baseline mental function, per family worse than usual over the last week.  CT head on 1/15 w/no acute findings. Per primary. Possibly improving 2. Cellulitis of R leg w/R shin ulcer - On ABX. Per primary 3.  ESRD -  On HD MWF. Last K 4.0. Orders written for HD tomorrow per regular schedule.    4.  Hypertension/volume  - BP in goal this AM. Continue home meds. LE edema on exam, does not appear grossly volume overloaded.  Left over EDW yesterday.  Plan to challenge w/HD tomorrow.  5.  Anemia of CKD - Hgb 12.0. No indication for ESA at this time.  6.  Secondary Hyperparathyroidism -  Ca and phos in goal. Continue VDRA. Not on binders 7. Nutrition - Renal diet w/fluid restrictions. Vitamins.    Jen Mow, PA-C [facility] Pager: [phone] 07/13/2018,8:14 AM  LOS: 2 days    Patient seen and examined, agree with above note with above modifications. Sleepy but arousable- possibly more alert today- knew [facility]- completed HD yest without incident.  BP normal for first time this AM on 3 BP meds- will cont to try and challenge EDW with HD Corliss Parish, MD 07/13/2018

## 2018-07-14 ENCOUNTER — Inpatient Hospital Stay (HOSPITAL_COMMUNITY): Payer: Medicare Other

## 2018-07-14 DIAGNOSIS — L0390 Cellulitis, unspecified: Secondary | ICD-10-CM

## 2018-07-14 LAB — RENAL FUNCTION PANEL
Albumin: 3.1 g/dL — ABNORMAL LOW (ref 3.5–5.0)
Anion gap: 14 (ref 5–15)
BUN: 29 mg/dL — ABNORMAL HIGH (ref 8–23)
CHLORIDE: 96 mmol/L — AB (ref 98–111)
CO2: 25 mmol/L (ref 22–32)
Calcium: 8.3 mg/dL — ABNORMAL LOW (ref 8.9–10.3)
Creatinine, Ser: 5.7 mg/dL — ABNORMAL HIGH (ref 0.61–1.24)
GFR calc Af Amer: 11 mL/min — ABNORMAL LOW (ref >60)
GFR calc non Af Amer: 10 mL/min — ABNORMAL LOW (ref >60)
Glucose, Bld: 117 mg/dL — ABNORMAL HIGH (ref 70–99)
Phosphorus: 4.8 mg/dL — ABNORMAL HIGH (ref 2.5–4.6)
Potassium: 3.5 mmol/L (ref 3.5–5.1)
Sodium: 135 mmol/L (ref 135–145)

## 2018-07-14 LAB — GLUCOSE, CAPILLARY
Glucose-Capillary: 141 mg/dL — ABNORMAL HIGH (ref 70–99)
Glucose-Capillary: 184 mg/dL — ABNORMAL HIGH (ref 70–99)
Glucose-Capillary: 155 mg/dL — ABNORMAL HIGH (ref 70–99)

## 2018-07-14 LAB — CBC
HCT: 36.7 % — ABNORMAL LOW (ref 39.0–52.0)
HEMOGLOBIN: 11.5 g/dL — AB (ref 13.0–17.0)
MCH: 26.7 pg (ref 26.0–34.0)
MCHC: 31.3 g/dL (ref 30.0–36.0)
MCV: 85.2 fL (ref 80.0–100.0)
Platelets: 168 10*3/uL (ref 150–400)
RBC: 4.31 MIL/uL (ref 4.22–5.81)
RDW: 17.4 % — ABNORMAL HIGH (ref 11.5–15.5)
WBC: 6.3 10*3/uL (ref 4.0–10.5)
nRBC: 0 % (ref 0.0–0.2)

## 2018-07-14 MED ORDER — NEPRO/CARBSTEADY PO LIQD
237.0000 mL | Freq: Two times a day (BID) | ORAL | Status: DC
  Administered 2018-07-18 – 2018-07-21 (×6): 237 mL via ORAL
  Filled 2018-07-14 (×17): qty 237

## 2018-07-14 MED ORDER — ACETAMINOPHEN 325 MG PO TABS
ORAL_TABLET | ORAL | Status: AC
  Filled 2018-07-14: qty 2

## 2018-07-14 MED ORDER — PENTAFLUOROPROP-TETRAFLUOROETH EX AERO: 1.0000 "application " | INHALATION_SPRAY | CUTANEOUS | Status: DC | PRN

## 2018-07-14 MED ORDER — VANCOMYCIN HCL IN DEXTROSE 1-5 GM/200ML-% IV SOLN
INTRAVENOUS | Status: AC
  Administered 2018-07-14: 1000 mg via INTRAVENOUS
  Filled 2018-07-14: qty 200

## 2018-07-14 MED ORDER — HEPARIN SODIUM (PORCINE) 1000 UNIT/ML IJ SOLN
INTRAMUSCULAR | Status: AC
  Administered 2018-07-14: 5000 [IU] via INTRAVENOUS_CENTRAL
  Filled 2018-07-14: qty 5

## 2018-07-14 MED ORDER — HEPARIN SODIUM (PORCINE) 1000 UNIT/ML DIALYSIS
5000.0000 [IU] | INTRAMUSCULAR | Status: DC | PRN
  Administered 2018-07-14: 5000 [IU] via INTRAVENOUS_CENTRAL
  Filled 2018-07-14: qty 5

## 2018-07-14 MED ORDER — LIDOCAINE HCL (PF) 1 % IJ SOLN: 5.0000 mL | INTRAMUSCULAR | Status: DC | PRN

## 2018-07-14 MED ORDER — SODIUM CHLORIDE 0.9 % IV SOLN: 100.0000 mL | INTRAVENOUS | Status: DC | PRN

## 2018-07-14 MED ORDER — HEPARIN SODIUM (PORCINE) 1000 UNIT/ML DIALYSIS: 1000.0000 [IU] | INTRAMUSCULAR | Status: DC | PRN

## 2018-07-14 MED ORDER — LIDOCAINE-PRILOCAINE 2.5-2.5 % EX CREA: 1.0000 "application " | TOPICAL_CREAM | CUTANEOUS | Status: DC | PRN

## 2018-07-14 NOTE — Progress Notes (Signed)
Pt states he is undecided about wearing CPAP tonight. Advised pt to have RN notify for RT if he decides he wants to wear. RT will continue to monitor.

## 2018-07-14 NOTE — Clinical Social Work Note (Signed)
Clinical Social Work Assessment  Patient Details  Name: Gregory Hood MRN: [id number] Date of Birth: [birth year]  Date of referral:  07/14/18               Reason for consult:  Facility Placement, Discharge Planning                Permission sought to share information with:  Family Supports Permission granted to share information::  Yes, Verbal Permission Granted  Name::     [place]::     Relationship::  Son  Sport and exercise psychologist Information:  [phone] (mobile)  Housing/Transportation Living arrangements for the past 2 months:  Single Family Home Source of Information:  Spouse Patient Interpreter Needed:  None Criminal Activity/Legal Involvement Pertinent to Current Situation/Hospitalization:  No - Comment as needed Significant Relationships:  Adult Children, Spouse Lives with:  Spouse Do you feel safe going back to the place where you live?  No Need for family participation in patient care:  Yes (Comment)  Care giving concerns:  Mrs. Aird had originally planned to take her husband home with [facility] services, however her sons told her that she cannot take care of and that SNF is best plan then home with one of them.   Social Worker assessment / plan: CSW and nurse case manager talked with wife in CM/SW office regarding discharge disposition for patient, Mrs. Gist was pleasant and engaged easily with case Freight forwarder and Education officer, museum. Wife shared background that her sons Darnelle Maffucci and Gerad over-ruled her out of concern for her and knowing that she could not care for patient at home, even with [facility] services in place. Son Darnelle Maffucci lives in [place], [place] and wants patient in a facility near him. Mrs. Widmayer indicated that her husband will not be happy about going to a skilled facility for rehab, but she thinks he will work hard so that he can get out of the facility.   Wife disclosed that she has been working with a Chief Executive Officer on asset distribution and she will be completing a Medicaid application soon for her  husband. When asked, wife reported that her husband was last at [facility] in [place] and discharged from there 04/20/18.    Mrs. Bame contacted her son and put him on speaker so that everyone could discuss disposition. Mr. Kincaid had already done some checking and made contact with SNF's in his area and provided CSW with two facilities: [facility] (talk with [place] or [place]), [phone]; [facility] [place] [phone]. Son also reported that there are HD centers in [place] or [address] in [place]. Son also indicated that there are HD centers in [place] and [place].  Employment status:  Other (Comment)(Pastor) Insurance information:  Managed Care, Medicare PT Recommendations:  Home with [place] / Referral to community resources:  Other (Comment Required)(CSW provided with SNF facilities near [place], [place])  Patient/Family's Response to care:  No concerns expressed regarding care during hospitalization.  Patient/Family's Understanding of and Emotional Response to Diagnosis, Current Treatment, and Prognosis:  Family understands needs for rehab and care at home with sons before returning home with wife. Sons have been very proactive in checking into SNF's and dialysis centers.  Emotional Assessment Appearance:  Other (Comment Required(Have not visited with patient. Talked with wife in CM/SW office) Attitude/Demeanor/Rapport:  Other, Unable to Assess(Have not visited with patient. Talked with wife in CM/SW office) Affect (typically observed):  Unable to Assess Orientation:  Oriented to Self,  Oriented to Situation Alcohol / Substance use:  Tobacco Use, Alcohol Use, Illicit Drugs(Patient reported that he quit smoking and does not drink or use illicit drugs) Psych involvement (Current and /or in the community):  No (Comment)  Discharge Needs  Concerns to be addressed:  Discharge Planning Concerns Readmission within the last 30 days:  No Current  discharge risk:  None Barriers to Discharge:  Continued Medical Work up, Requiring sitter/restraints, No SNF bed, Waiting for outpatient dialysis(HD will need to be set-up outside of [facility])   Sharlet Salina, [place], LCSW 07/14/2018, 5:02 PM

## 2018-07-14 NOTE — Accreditation Note (Signed)
[facility] Nurse wound consult note Reason for Consult: Consult requested for right leg.  Pt states it is a chronic wound and he is not aware of what topical treatment was being applied prior to admission. Wound type: Right calf with full thickness stasis ulcer Measurement: 6X4X.1cm Wound bed: red and moist Drainage (amount, consistency, odor) no fluctuance, slight odor, mod amt tan drainage Periwound: Intact skin surrounding Dressing procedure/placement/frequency: Aquacel to absorb drainage and provide antimicrobial benefits. Foam dressing to protect from further injury. No family members present to discuss plan of care. Pt could benefit from home health assistance for dressing changes after discharge, please order if desired.   Please re-consult if further assistance is needed.  Thank-you,  Julien Girt MSN, [place], [place], [place], [place]

## 2018-07-14 NOTE — Progress Notes (Signed)
Placed pt on CPAP with auto settings max 20 and min 5. Added water to chamber and placed on XL mask

## 2018-07-14 NOTE — Progress Notes (Signed)
[facility] Progress Note   Dialysis Orders: MWF -[place] 4.5hrs, [id number], [id number], EDW 108kg,2K/2.25Ca Access:LU AVF Heparin5000 unit bolus Mircera45mcg q2wks - last 06/07/18 Calcitriol0.41mcg PO qHD  Assessment/Plan: 1. Acute encephalopathy - infection vs. HTN. Labs reassuring. RPR negative. unsure of baseline mental function, per family worse than usual over the last week. CT head on 1/15 w/no acute findings. Less alert this am 2. Cellulitis of R leg w/R shin ulcer - On Vanc 3. ESRD - On HD MWF.labs pending starting on 3 K bath 4. Hypertension/volume - BP up some net UF 3 L Wed - goaled for the same today  5. Anemia of CKD - Hgb 12.0 > 11.5 . No indication for ESA at this time 6. Secondary Hyperparathyroidism - Ca and phos in goal. Continue VDRA. Not on binders 7. Nutrition - Renal diet w/fluid restrictions. Vitamins. alb 2.9 - add nepro 8. Disp - pending  Myriam Jacobson, [facility] Beeper [phone] 07/14/2018,8:01 AM  LOS: 3 days   Subjective:   Sleeping.  Rouses poorly. "At rehab"  Objective Vitals:   07/14/18 0655 07/14/18 0704 07/14/18 0710 07/14/18 0730  BP: (!) 163/39 (!) 160/84 (!) 155/87 (!) 163/97  Pulse: 80 69 72 68  Resp: 18  20   Temp: 98.9 F (37.2 C)     TempSrc: Oral     SpO2: 93%     Weight: 109 kg     Height:       Physical Exam goal goal 3.5  General: NAD will appearing male Heart: RRR Lungs: clear anteriorly Abdomen: soft NT + BS Extremities: 1+ R> L pitting  Dialysis Access: left upper AVF Qb 400 dressing right shin, thicken LE skin very woody   Additional Objective Labs: Basic Metabolic Panel: Recent Labs  Lab 07/12/18 0504  NA 136  K 4.0  CL 93*  CO2 28  GLUCOSE 131*  BUN 28*  CREATININE 5.96*  CALCIUM 8.6*  PHOS 5.1*   Liver Function Tests: No results for input(s): AST, ALT, ALKPHOS, BILITOT, PROT, ALBUMIN in the last 168 hours. No results for input(s): LIPASE,  AMYLASE in the last 168 hours. CBC: Recent Labs  Lab 07/11/18 2330 07/12/18 0504 07/14/18 0720  WBC  --  7.0 6.3  NEUTROABS  --  5.7  --   HGB  --  12.0* 11.5*  HCT 38.2 38.0* 36.7*  MCV  --  85.8 85.2  PLT  --  140* 168   Blood Culture No results found for: SDES, SPECREQUEST, CULT, REPTSTATUS  Cardiac Enzymes: No results for input(s): CKTOTAL, CKMB, CKMBINDEX, TROPONINI in the last 168 hours. CBG: Recent Labs  Lab 07/12/18 2136 07/13/18 0825 07/13/18 1130 07/13/18 1624 07/13/18 2116  GLUCAP 111* 115* 124* 95 143*   Iron Studies: No results for input(s): IRON, TIBC, TRANSFERRIN, FERRITIN in the last 72 hours. Lab Results  Component Value Date   INR 0.95 03/08/2010   Studies/Results: Ct Head Wo Contrast  Result Date: 07/12/2018 CLINICAL DATA:  68 year old who presented this admission with fever and confusion. Patient fell yesterday and has ataxia. EXAM: CT HEAD WITHOUT CONTRAST TECHNIQUE: Contiguous axial images were obtained from the base of the skull through the vertex without intravenous contrast. COMPARISON:  07/10/2018, 01/21/2018 and earlier. FINDINGS: Patient motion blurred images of the skull base but the study does appear diagnostic. Brain: Moderate cortical, deep and cerebellar atrophy, unchanged. Severe changes of small vessel disease of the white matter diffusely, unchanged. Old lacunar strokes involving the basal ganglia  bilaterally, unchanged. Physiologic calcifications in the basal ganglia, unchanged. No mass lesion. No midline shift. No acute hemorrhage or hematoma. No extra-axial fluid collections. No evidence of acute infarction. No new/acute abnormalities involving the brain since the examination 2 days ago. Vascular: Severe BILATERAL carotid siphon and LEFT vertebral artery atherosclerosis. No hyperdense vessel. Skull: No skull fracture or other focal osseous abnormality involving the skull. Sinuses/Orbits: Visualized paranasal sinuses, BILATERAL mastoid air  cells and BILATERAL middle ear cavities well-aerated. LEFT phthisis bulbi. Normal-appearing RIGHT orbit and globe. Other: None. IMPRESSION: 1. No acute intracranial abnormality. 2. Stable moderate generalized atrophy and severe chronic microvascular ischemic changes of the white matter. Electronically Signed   By: Evangeline Dakin M.D.   On: 07/12/2018 11:53   Medications: . sodium chloride    . sodium chloride Stopped (07/12/18 0318)  . sodium chloride    . sodium chloride    . vancomycin 1,000 mg (07/12/18 1554)   . amLODipine  10 mg Oral QPC supper  . aspirin EC  81 mg Oral Daily  . atropine  1 drop Left Eye QHS  . calcitRIOL  0.25 mcg Oral Daily  . carvedilol  25 mg Oral BID  . Chlorhexidine Gluconate Cloth  6 each Topical Q0600  . Chlorhexidine Gluconate Cloth  6 each Topical Q0600  . clopidogrel  75 mg Oral Daily  . ezetimibe  10 mg Oral QPM  . finasteride  5 mg Oral Daily  . folic acid  2 mg Oral Daily  . hydrALAZINE  50 mg Oral Q8H  . insulin aspart  0-5 Units Subcutaneous QHS  . insulin aspart  0-9 Units Subcutaneous TID WC  . polyethylene glycol  17 g Oral BID  . prednisoLONE acetate  1 drop Left Eye QHS  . QUEtiapine  12.5 mg Oral QHS  . senna-docusate  2 tablet Oral Daily  . sodium chloride flush  3 mL Intravenous Q12H  . vancomycin variable dose per unstable renal function (pharmacist dosing)   Does not apply See admin instructions

## 2018-07-14 NOTE — Progress Notes (Signed)
ABI/TBI completed. Limited access due to patient condition. More details please see preliminary notes on CV PROC under chart review. Gregory Hood H Erol Flanagin(RDMS RVT) .07/14/18 4:56 PM

## 2018-07-14 NOTE — Progress Notes (Signed)
Pharmacy Antibiotic Note  Gregory Hood is a 68 y.o. male admitted on 07/11/2018 with RLE cellulitis.  Patient is ESRD with HD MWF per records.  Pharmacy has been consulted for Vancomycin dosing.  Plan: Vancomycin 1000mg  IV MWF after HD. Follow up labs, culture data F/u de-escalation to PO (Doxycycline?)  Height: 5\' 9"  (175.3 cm) Weight: 240 lb 4.8 oz (109 kg) IBW/kg (Calculated) : 70.7  Temp (24hrs), Avg:98.5 F (36.9 C), Min:98 F (36.7 C), Max:99 F (37.2 C)  Recent Labs  Lab 07/12/18 0504 07/14/18 0720  WBC 7.0 6.3  CREATININE 5.96*  --     Estimated Creatinine Clearance: 14.8 mL/min (A) (by C-G formula based on SCr of 5.96 mg/dL (H)).    Allergies  Allergen Reactions  . Bee Venom Swelling  . Lexiscan [Regadenoson] Other (See Comments)    Seizure like-activity after lexiscan given.  . Ace Inhibitors Cough    Antimicrobials this admission: Vanc 1/14 >>  Dose adjustments this admission:   Microbiology results: pending  Gregory Hood A. Levada Dy, PharmD, [place] Pager: [phone] Please utilize Amion for appropriate phone number to reach the unit pharmacist ([place])    07/14/2018 8:11 AM

## 2018-07-14 NOTE — Procedures (Signed)
Patient was seen on dialysis and the procedure was supervised.  BFR 400  Via AVF BP is  131/69.   Patient appears to be tolerating treatment well  Gregory Hood 07/14/2018

## 2018-07-14 NOTE — Care Management Note (Addendum)
Case Management Note Manya Silvas, RN MSN CCM Transitions of Care 16[facility] [place] [phone]  Patient Details  Name: Gregory Hood MRN: [id number] Date of Birth: April 08, 1951  Subjective/Objective:           Cellulitis of R leg         Action/Plan: 1/17/20202-1025-PTA home with wife and [facility] services (RN, Aide)-[facility]. Noted recommendations for PT and OT [facility]. Spoke with wife via telephone who stated that she wanted to continue with [name]. Discussed recommendations for OT, PT. [name] stated that they are working to get services that will assist with 24 hour supervision. States that [facility] provides transportation to dialysis. No medication issues at this time. Referral made to [place] with [name] who verified that patient is active for RN, Aide-referral accepted for PT, OT. Patient will need [name] orders for PT, OT, RN, and Aide with [facility] when ready for transition home. Orders and discharge summary to be faxed to [name] when ready to transition home. Will continue to follow for transition of care needs.   Expected Discharge Date:                  Expected Discharge Plan:  [place]  In-House Referral:  Clinical Social Work  Discharge planning Services  CM Consult  Post Acute Care Choice:  Resumption of Svcs/PTA Provider([facility]) Choice offered to:  Spouse  DME Arranged:  N/A DME Agency:  NA  HH Arranged:  RN, OT, PT, Nurse's Aide [place] Agency:  [facility]  Status of Service:  In process, will continue to follow  If discussed at Long Length of Stay Meetings, dates discussed:    Additional Comments:  [name], RN 07/14/2018, 10:19 AM

## 2018-07-14 NOTE — Care Management Note (Signed)
Case Management Note Manya Silvas, RN MSN CCM Transitions of Care [facility] [place] [phone]  Patient Details  Name: DEVONTA BLANFORD MRN: [id number] Date of Birth: 1951/01/29  Subjective/Objective:          R leg celllulitis          Action/Plan: PTA home with [facility] services (RN, Aide)-[facility] [place] with spouse this afternoon who stated that she is unable to provide 24 hour supervision. States that her son, Darnelle Maffucci, in [place], [place] wants him to be placed in a facility near him, which would involve changing his dialysis center. Darnelle Maffucci has provided CSW with a couple facilities to contact. Will continue to follow for transition of care needs.   Expected Discharge Date:                  Expected Discharge Plan:  [place]  In-House Referral:  Clinical Social Work  Discharge planning Services  CM Consult  Post Acute Care Choice:  NA([facility]) Choice offered to:  NA  DME Arranged:  N/A DME Agency:  NA  HH Arranged:    HH Agency:     Status of Service:  In process, will continue to follow  If discussed at Long Length of Stay Meetings, dates discussed:    Additional Comments:  [name], RN 07/14/2018, 4:25 PM

## 2018-07-14 NOTE — Progress Notes (Signed)
PROGRESS NOTE    Gregory Hood  OBS:[id number] DOB: October 16, 1950 DOA: 07/11/2018 PCP: Jilda Panda, MD   Hospital course: 68 year old male with history of ESRD on HD MWF, chronic diastolic CHF, hypertension, history of CVA, chronic lower extremity wounds, Alzheimer dementia, legal blindness lives at home with wife presented to [facility] ER for evaluation of confusion and fevers.  As per wife patient has been confused for roughly 2 days, went to dialysis on 1/13, reportedly completed the session but was noted to have fever of 102 with some agitation and possible confusion.  Patient having mild drainage from his wound on the right shin.  At home patient does relate with assistance. As per the granddaughter he has been having memory issues for past 2 to 3 years and for last 1 week has been worsening, making random calls over phone and not being himself.  In the ER, [facility] EKG with sinus rhythm, chest x-ray pulmonary vascular congestion but no edema or consolidation, CT head negative for acute abnormality, routine lab work CBC fairly stable, influenza PCR negative troponin was borderline positive, UA notes history of infection.1 L normal saline, Zyvox and Haldol in the ED.  He was transferred to [facility]. She was admitted, managed with IV antibiotics for this wound.  It is nicely improving, no more erythema redness present on his right lower leg.  His mental status seems overall stable and improved.Seen by PT OT and has recommended home health and will set up home health PT OT RN and aide on discharge.  Assessment & Plan:   Cellulitis of right leg with right shin ulcer w some draiange : Overall much improved.  Continue local on dressing wound care consulted. Cont on vancomycin for now and can transition to p.o. doxycycline on discharge. We will obtain ABI of his legs.  Acute encephalopathy, likely toxic metabolic in the setting of dementia. Family notes that he has memory issues and has been acting  unusual for last 1 week.Repeat CT head is unremarkable for acute finding.  Has PM and MRI may not be feasible.  His overall mental status seems to have improved and has baseline dementia/memory issues. W/u with Ammonia, tsh, B12 Normal.  RPR, hiv  Also neg. continue supportive care with home meds and family at home.  CT head x2 without any acute finding. Cont lwo dose Seroquel bedtime.  Fall,?  Left forehead injury.  No obvious injury on the head and CT head on repeat was unremarkable.  Educated on fall precaution and safety.  Will be setting up home health care   Hypertensive urgency :  Blood pressure has stabilized.  Continue home medication with coreg and hydralazine.  DM (diabetes mellitus), type 2 with renal complications: sugar stable.  Resume home regimen on discharge  Chronic diastolic heart failure : Stable he is on Lasix and will be continued.  Monitor weight, intake output.  End stage renal disease on HD MWF: s/p HD as per schedule 1/17  History of stroke: Overall nonfocal and stable.  He is on aspirin, Plavix and Zetia.  CT HEAD x2 unremarkable for acute finding  DVT prophylaxis: SQ HEPARIN  Code Status: FULL Family Communication: Wife was communicated Disposition Plan: Continue to work with PT OT. Spoke w Pt's wife she is making arrangement at home to have someone with him 24 hr and does not have arrangement yet. Anticipate disposition next 24 hrs, informed the wife.  Consultants: Nephrology  Procedures: Hemodialysis  Antimicrobials: Vancomycin 07/11/2018 >>  Subjective: Seen  and examined. Had HD today  AA and oriented to self, place, situation. Denies nausea, vomiting, chest pain " I am here to have my leg fixed"  Objective: Vitals:   07/14/18 1100 07/14/18 1130 07/14/18 1135 07/14/18 1300  BP: (!) 147/79 (!) 177/86 136/76 139/86  Pulse: 70 67 70 72  Resp:   18 18  Temp:   98.4 F (36.9 C) 98.2 F (36.8 C)  TempSrc:   Oral Oral  SpO2:   94% 99%  Weight:    105 kg   Height:        Intake/Output Summary (Last 24 hours) at 07/14/2018 1355 Last data filed at 07/14/2018 1135 Gross per 24 hour  Intake 240 ml  Output 3001 ml  Net -2761 ml   Filed Weights   07/12/18 1200 07/14/18 0655 07/14/18 1135  Weight: 109.4 kg 109 kg 105 kg   Weight change: -0.4 kg  Body mass index is 34.18 kg/m.  Examination: General exam: Calm, comfortable, not in acute distress, older for age, obese, HEENT:Oral mucosa moist, Ear/Nose WNL grossly, dentition normal. Respiratory system: Bilateral equal air entry, no crackles and wheezing, no use of accessory muscle, non tender on palpation. Cardiovascular system: regular rate and rhythm, S1 & S2 heard, No JVD/murmurs. Gastrointestinal system: Abdomen soft, non-tender, non-distended, BS +. No hepatosplenomegaly palpable. Nervous System:Alert, awake and oriented to self, place, sob, day. He is aware that he is here due to his leg wound. able to move UE and LE, sensation intact. Extremities: No edema, distal peripheral pulses palpable.  Skin: No rashes,no icterus. hyperpigmentation skin changes and hardened skin b/l LE. MSK: Normal muscle bulk,tone, power Rt atn shin w wound/ulcer- pinkish, less drainage.  Medications:  Scheduled Meds: . amLODipine  10 mg Oral QPC supper  . aspirin EC  81 mg Oral Daily  . atropine  1 drop Left Eye QHS  . calcitRIOL  0.25 mcg Oral Daily  . carvedilol  25 mg Oral BID  . Chlorhexidine Gluconate Cloth  6 each Topical Q0600  . Chlorhexidine Gluconate Cloth  6 each Topical Q0600  . clopidogrel  75 mg Oral Daily  . ezetimibe  10 mg Oral QPM  . feeding supplement (NEPRO CARB STEADY)  237 mL Oral BID BM  . finasteride  5 mg Oral Daily  . folic acid  2 mg Oral Daily  . hydrALAZINE  50 mg Oral Q8H  . insulin aspart  0-5 Units Subcutaneous QHS  . insulin aspart  0-9 Units Subcutaneous TID WC  . polyethylene glycol  17 g Oral BID  . prednisoLONE acetate  1 drop Left Eye QHS  .  QUEtiapine  12.5 mg Oral QHS  . senna-docusate  2 tablet Oral Daily  . sodium chloride flush  3 mL Intravenous Q12H  . vancomycin variable dose per unstable renal function (pharmacist dosing)   Does not apply See admin instructions   Continuous Infusions: . sodium chloride    . sodium chloride Stopped (07/12/18 0318)  . vancomycin Stopped (07/14/18 1124)    Data Reviewed: I have personally reviewed following labs and imaging studies  CBC: Recent Labs  Lab 07/11/18 2330 07/12/18 0504 07/14/18 0720  WBC  --  7.0 6.3  NEUTROABS  --  5.7  --   HGB  --  12.0* 11.5*  HCT 38.2 38.0* 36.7*  MCV  --  85.8 85.2  PLT  --  140* 264   Basic Metabolic Panel: Recent Labs  Lab 07/12/18 0504 07/14/18 0720  NA 136 135  K 4.0 3.5  CL 93* 96*  CO2 28 25  GLUCOSE 131* 117*  BUN 28* 29*  CREATININE 5.96* 5.70*  CALCIUM 8.6* 8.3*  PHOS 5.1* 4.8*   GFR: Estimated Creatinine Clearance: 15.2 mL/min (A) (by C-G formula based on SCr of 5.7 mg/dL (H)). Liver Function Tests: Recent Labs  Lab 07/14/18 0720  ALBUMIN 3.1*   No results for input(s): LIPASE, AMYLASE in the last 168 hours. Recent Labs  Lab 07/11/18 2330  AMMONIA 33   Coagulation Profile: No results for input(s): INR, PROTIME in the last 168 hours. Cardiac Enzymes: No results for input(s): CKTOTAL, CKMB, CKMBINDEX, TROPONINI in the last 168 hours. BNP (last 3 results) No results for input(s): PROBNP in the last 8760 hours. HbA1C: No results for input(s): HGBA1C in the last 72 hours. CBG: Recent Labs  Lab 07/13/18 0825 07/13/18 1130 07/13/18 1624 07/13/18 2116 07/14/18 1250  GLUCAP 115* 124* 95 143* 141*   Lipid Profile: No results for input(s): CHOL, HDL, LDLCALC, TRIG, CHOLHDL, LDLDIRECT in the last 72 hours. Thyroid Function Tests: Recent Labs    07/11/18 2330  TSH 2.504   Anemia Panel: Recent Labs    07/12/18 0504  VITAMINB12 505   Sepsis Labs: No results for input(s): PROCALCITON, LATICACIDVEN  in the last 168 hours.  Recent Results (from the past 240 hour(s))  MRSA PCR Screening     Status: None   Collection Time: 07/11/18  9:33 PM  Result Value Ref Range Status   MRSA by PCR NEGATIVE NEGATIVE Final    Comment:        The GeneXpert MRSA Assay (FDA approved for NASAL specimens only), is one component of a comprehensive MRSA colonization surveillance program. It is not intended to diagnose MRSA infection nor to guide or monitor treatment for MRSA infections. Performed at [facility], [place] [address] [place], [place] [address]       Radiology Studies: No results found.    LOS: 3 days   Time spent: More than 50% of that time was spent in counseling and/or coordination of care.  Antonieta Pert, MD [facility]  07/14/2018, 1:55 PM

## 2018-07-14 NOTE — Progress Notes (Signed)
OT Cancellation Note  Patient Details Name: Gregory Hood MRN: [id number] DOB: 1951/02/05   Cancelled Treatment:    Reason Eval/Treat Not Completed: Patient at procedure or test/ unavailable. Pt at HD, will check back next available day/time  [name] 07/14/2018, 11:05 AM

## 2018-07-15 LAB — GLUCOSE, CAPILLARY
GLUCOSE-CAPILLARY: 123 mg/dL — AB (ref 70–99)
Glucose-Capillary: 146 mg/dL — ABNORMAL HIGH (ref 70–99)
Glucose-Capillary: 151 mg/dL — ABNORMAL HIGH (ref 70–99)
Glucose-Capillary: 114 mg/dL — ABNORMAL HIGH (ref 70–99)

## 2018-07-15 MED ORDER — CHLORHEXIDINE GLUCONATE CLOTH 2 % EX PADS
6.0000 | MEDICATED_PAD | Freq: Every day | CUTANEOUS | Status: DC
  Administered 2018-07-15 – 2018-07-17 (×3): 6 via TOPICAL

## 2018-07-15 MED ORDER — DOXYCYCLINE HYCLATE 100 MG PO TABS
100.0000 mg | ORAL_TABLET | Freq: Two times a day (BID) | ORAL | Status: AC
  Administered 2018-07-15 – 2018-07-21 (×13): 100 mg via ORAL
  Filled 2018-07-15 (×13): qty 1

## 2018-07-15 NOTE — Progress Notes (Signed)
[facility] Progress Note   Dialysis Orders:  MWF -[place] 4.5hrs, [id number], [id number], EDW 108kg,2K/2.25Ca Access:LU AVF Heparin5000 unit bolus Mircera1mcg q2wks - last 06/07/18 Calcitriol0.72mcg PO qHD  Assessment/Plan: 1. Acute encephalopathy -infection vs. HTN. Labs reassuring. RPR negative.unsure of baseline mental function, per family worse than usual over the last week.CT head on 1/15 w/no acute findings. Clear and well oriented this am. 2. Cellulitis of R leg w/R shin ulcer - On Vanc 3. ESRD - On HD MWF.K 3.5 Friday - requiring added K bath - next HD Monday 4. Hypertension/volume - BPup some net UF 3 L Wed and again Friday - CXR neg for volume - post wt 105 kg -lower edw for discharge - BP in the 130s 5. Anemia of CKD - Hgb 12.0 > 11.5 . No indication for ESA at this time 6. Secondary Hyperparathyroidism - Ca and phos in goal. Continue VDRA. Not on binders 7. Nutrition - Renal diet w/fluid restrictions. Vitamins.alb 2.9 - add nepro 8.   Disp - CM/SW working with patient and family - see 1/17 note for details; will need to change dialysis units if transferred out of area.  Myriam Jacobson, PA-C [facility] Beeper [phone] 07/15/2018,7:51 AM  LOS: 4 days   Subjective:   Feels good . Oriented x 3- ate most of breakfast. Still has sitter.  Objective Vitals:   07/14/18 1135 07/14/18 1300 07/14/18 2057 07/15/18 0438  BP: 136/76 139/86 139/73 139/83  Pulse: 70 72 76 79  Resp: 18 18 18 16   Temp: 98.4 F (36.9 C) 98.2 F (36.8 C) 99.1 F (37.3 C)   TempSrc: Oral Oral Oral   SpO2: 94% 99% 99% 94%  Weight: 105 kg     Height:       Physical Exam General: NAD sitting in bed eating breakfast alert Heart: RRR Lungs: no rales Abdomen:soft NT  Extremities: LE edema better - still tr on the left - dressing on right shin- Dialysis Access: left upper AVF Qb + bruit   Additional Objective Labs: Basic Metabolic  Panel: Recent Labs  Lab 07/12/18 0504 07/14/18 0720  NA 136 135  K 4.0 3.5  CL 93* 96*  CO2 28 25  GLUCOSE 131* 117*  BUN 28* 29*  CREATININE 5.96* 5.70*  CALCIUM 8.6* 8.3*  PHOS 5.1* 4.8*   Liver Function Tests: Recent Labs  Lab 07/14/18 0720  ALBUMIN 3.1*   No results for input(s): LIPASE, AMYLASE in the last 168 hours. CBC: Recent Labs  Lab 07/11/18 2330 07/12/18 0504 07/14/18 0720  WBC  --  7.0 6.3  NEUTROABS  --  5.7  --   HGB  --  12.0* 11.5*  HCT 38.2 38.0* 36.7*  MCV  --  85.8 85.2  PLT  --  140* 168   Blood Culture No results found for: SDES, SPECREQUEST, CULT, REPTSTATUS  Cardiac Enzymes: No results for input(s): CKTOTAL, CKMB, CKMBINDEX, TROPONINI in the last 168 hours. CBG: Recent Labs  Lab 07/13/18 2116 07/14/18 1250 07/14/18 1754 07/14/18 2058 07/15/18 0731  GLUCAP 143* 141* 184* 155* 123*   Iron Studies: No results for input(s): IRON, TIBC, TRANSFERRIN, FERRITIN in the last 72 hours. Lab Results  Component Value Date   INR 0.95 03/08/2010   Studies/Results: Vas [place] Burnard Bunting With/wo Tbi  Result Date: 07/14/2018 LOWER EXTREMITY DOPPLER STUDY Indications: Ulceration. High Risk Factors: Diabetes.  Limitations: IV and AVF graft on site; ulcer patches on ankle, patient cannot  tolerate the pressure cuff's compression. Comparison Study: No comparison study available Performing Technologist: Rudell Cobb RDMS RVT  Examination Guidelines: A complete evaluation includes at minimum, Doppler waveform signals and systolic blood pressure reading at the level of bilateral brachial, anterior tibial, and posterior tibial arteries, when vessel segments are accessible. Bilateral testing is considered an integral part of a complete examination. Photoelectric Plethysmograph (PPG) waveforms and toe systolic pressure readings are included as required and additional duplex testing as needed. Limited examinations for reoccurring indications may be performed as  noted.  ABI Findings: +---------+------------------+-----+----------+--------------------------------+ Right    Rt Pressure (mmHg)IndexWaveform  Comment                          +---------+------------------+-----+----------+--------------------------------+ Brachial 141                    triphasic                                  +---------+------------------+-----+----------+--------------------------------+ PTA                             monophasicpressure unable to obtain due to                                           patient has ulcer and patches on                                           ankle                            +---------+------------------+-----+----------+--------------------------------+ DP                              monophasicpressure unable to obtain due to                                           patient has ulcer and patches on                                           ankle                            +---------+------------------+-----+----------+--------------------------------+ Great Toe96                0.68                                            +---------+------------------+-----+----------+--------------------------------+ +---------+------------------+-----+-------------------+-----------------------+ Left     Lt Pressure (mmHg)IndexWaveform           Comment                 +---------+------------------+-----+-------------------+-----------------------+ Brachial  triphasic          AVF graft on site with                                                     bandages                +---------+------------------+-----+-------------------+-----------------------+ PTA      144               1.02 monophasic                                 +---------+------------------+-----+-------------------+-----------------------+ DP       44                0.31 dampened monophasic                         +---------+------------------+-----+-------------------+-----------------------+ Great Toe78                0.55                                            +---------+------------------+-----+-------------------+-----------------------+ +-------+-----------+-----------+------------+------------+ ABI/TBIToday's ABIToday's TBIPrevious ABIPrevious TBI +-------+-----------+-----------+------------+------------+ Right             0.68                                +-------+-----------+-----------+------------+------------+ Left   1.02       0.55                                +-------+-----------+-----------+------------+------------+  Summary: Right: The right toe-brachial index is abnormal. Left: Resting left ankle-brachial index is within normal range. No evidence of significant left lower extremity arterial disease. The left toe-brachial index is abnormal.  *See table(s) above for measurements and observations.     Preliminary    Medications: . sodium chloride    . sodium chloride Stopped (07/12/18 0318)  . vancomycin Stopped (07/14/18 1124)   . amLODipine  10 mg Oral QPC supper  . aspirin EC  81 mg Oral Daily  . atropine  1 drop Left Eye QHS  . calcitRIOL  0.25 mcg Oral Daily  . carvedilol  25 mg Oral BID  . Chlorhexidine Gluconate Cloth  6 each Topical Q0600  . Chlorhexidine Gluconate Cloth  6 each Topical Q0600  . clopidogrel  75 mg Oral Daily  . ezetimibe  10 mg Oral QPM  . feeding supplement (NEPRO CARB STEADY)  237 mL Oral BID BM  . finasteride  5 mg Oral Daily  . folic acid  2 mg Oral Daily  . hydrALAZINE  50 mg Oral Q8H  . insulin aspart  0-5 Units Subcutaneous QHS  . insulin aspart  0-9 Units Subcutaneous TID WC  . polyethylene glycol  17 g Oral BID  . prednisoLONE acetate  1 drop Left Eye QHS  . QUEtiapine  12.5 mg Oral QHS  . senna-docusate  2 tablet Oral Daily  . sodium chloride flush  3 mL Intravenous Q12H  .  vancomycin variable dose per  unstable renal function (pharmacist dosing)   Does not apply See admin instructions

## 2018-07-15 NOTE — Progress Notes (Signed)
PROGRESS NOTE    Gregory Hood  LEX:[id number] DOB: 1951-04-21 DOA: 07/11/2018 PCP: Jilda Panda, MD   Hospital course: 68 year old male with history of ESRD on HD MWF, chronic diastolic CHF, hypertension, history of CVA, chronic lower extremity wounds, Alzheimer dementia, legal blindness lives at home with wife presented to [facility] ER for evaluation of confusion and fevers.  As per wife patient has been confused for roughly 2 days, went to dialysis on 1/13, reportedly completed the session but was noted to have fever of 102 with some agitation and possible confusion.  Patient having mild drainage from his wound on the right shin.  At home patient does relate with assistance. As per the granddaughter he has been having memory issues for past 2 to 3 years and for last 1 week has been worsening, making random calls over phone and not being himself.  In the ER, [facility] EKG with sinus rhythm, chest x-ray pulmonary vascular congestion but no edema or consolidation, CT head negative for acute abnormality, routine lab work CBC fairly stable, influenza PCR negative troponin was borderline positive, UA notes history of infection.1 L normal saline, Zyvox and Haldol in the ED.  He was transferred to [facility]. She was admitted, managed with IV antibiotics for this wound.  It is nicely improving, no more erythema redness present on his right lower leg.  His mental status seems overall stable and improved.Seen by PT OT and has recommended home health and will set up home health PT OT RN and aide on discharge.  Assessment & Plan:   Cellulitis of right leg with right shin ulcer w some draiange : Overall much improved.  Appreciate wound care instruction. Continue local on dressing wound care consulted.  Got Vancomycin after dialysis  1/17. We will transition to p.o. doxycycline.ABI difficult leg ulcer,pending final report.he has good distal pulses on doppler( rn confirmed).  Acute encephalopathy, likely toxic  metabolic in the setting of dementia. Family notes that he has memory issues and has been acting unusual for last 1 week.Repeat CT head is unremarkable for acute finding.  Has PM and MRI may not be feasible.  His overall mental status seems to have improved, he is alert, awake, oriented to place, self, people, communicative.  He does have baseline dementia/memory issues. W/u with Ammonia, tsh, B12 Normal.  RPR, hiv  Also neg. continue supportive care with home meds and family at home.  CT head x2 without any acute finding. Cont low dose Seroquel bedtime.  Fall,?  Left forehead injury.  No obvious injury on the head and CT head on repeat was unremarkable.  Educated on fall precaution and safety.  PT OT appreciated.  Look into skilled nursing facility.  Hypertensive urgency :  Blood pressure has stabilized.  Continue home medication with coreg and hydralazine.  DM (diabetes mellitus), type 2 with renal complications: sugar stable.  Continue current insulin insulin.   Chronic diastolic heart failure : Stable he is on Lasix and will be continued.  Monitor weight, intake output.  End stage renal disease on HD MWF: s/p HD as per schedule 1/17  History of stroke: Overall nonfocal and stable.  He is on aspirin, Plavix and Zetia.  CT HEAD x2 unremarkable for acute finding  DVT prophylaxis: SQ HEPARIN  Code Status: FULL Family Communication: Wife was communicated Disposition Plan: Looking into skilled nursing facility as family unable to provide 24 x7 care at home  Consultants: Nephrology  Procedures: Hemodialysis  Antimicrobials: Vancomycin 07/11/2018 >>  Subjective:  Seen and examined. Sitter at the bedside Appears alert awake oriented and at baseline more or less. No new complaints.  Objective: Vitals:   07/14/18 1135 07/14/18 1300 07/14/18 2057 07/15/18 0438  BP: 136/76 139/86 139/73 139/83  Pulse: 70 72 76 79  Resp: 18 18 18 16   Temp: 98.4 F (36.9 C) 98.2 F (36.8 C) 99.1 F  (37.3 C)   TempSrc: Oral Oral Oral   SpO2: 94% 99% 99% 94%  Weight: 105 kg     Height:        Intake/Output Summary (Last 24 hours) at 07/15/2018 0955 Last data filed at 07/15/2018 0815 Gross per 24 hour  Intake 840 ml  Output 3300 ml  Net -2460 ml   Filed Weights   07/12/18 1200 07/14/18 0655 07/14/18 1135  Weight: 109.4 kg 109 kg 105 kg   Weight change: -4 kg  Body mass index is 34.18 kg/m.  Examination:  General exam: Calm, comfortable, not in acute distress, obese, older for age HEENT:Oral mucosa moist, Ear/Nose WNL grossly, dentition normal. Respiratory system: Bilateral equal air entry, no crackles and wheezing, no use of accessory muscle, non tender on palpation. Cardiovascular system: regular rate and rhythm, S1 & S2 heard, No JVD/murmurs. Gastrointestinal system: Abdomen soft, non-tender, non-distended, BS +. No hepatosplenomegaly palpable. Nervous System:Alert, awake and oriented at baseline. Able to move UE and LE, sensation intact. Extremities: No edema, distal peripheral pulses palpable.  Skin: No rashes,no icterus.  Right anterior shin with ulcer which is healing.  Mildly tender. MSK: Normal muscle bulk,tone, power LUE aVF + w thrill  Medications:  Scheduled Meds: . amLODipine  10 mg Oral QPC supper  . aspirin EC  81 mg Oral Daily  . atropine  1 drop Left Eye QHS  . calcitRIOL  0.25 mcg Oral Daily  . carvedilol  25 mg Oral BID  . Chlorhexidine Gluconate Cloth  6 each Topical Q0600  . Chlorhexidine Gluconate Cloth  6 each Topical Q0600  . clopidogrel  75 mg Oral Daily  . ezetimibe  10 mg Oral QPM  . feeding supplement (NEPRO CARB STEADY)  237 mL Oral BID BM  . finasteride  5 mg Oral Daily  . folic acid  2 mg Oral Daily  . hydrALAZINE  50 mg Oral Q8H  . insulin aspart  0-5 Units Subcutaneous QHS  . insulin aspart  0-9 Units Subcutaneous TID WC  . polyethylene glycol  17 g Oral BID  . prednisoLONE acetate  1 drop Left Eye QHS  . QUEtiapine  12.5 mg  Oral QHS  . senna-docusate  2 tablet Oral Daily  . sodium chloride flush  3 mL Intravenous Q12H  . vancomycin variable dose per unstable renal function (pharmacist dosing)   Does not apply See admin instructions   Continuous Infusions: . sodium chloride    . sodium chloride Stopped (07/12/18 0318)  . vancomycin Stopped (07/14/18 1124)    Data Reviewed: I have personally reviewed following labs and imaging studies  CBC: Recent Labs  Lab 07/11/18 2330 07/12/18 0504 07/14/18 0720  WBC  --  7.0 6.3  NEUTROABS  --  5.7  --   HGB  --  12.0* 11.5*  HCT 38.2 38.0* 36.7*  MCV  --  85.8 85.2  PLT  --  140* 981   Basic Metabolic Panel: Recent Labs  Lab 07/12/18 0504 07/14/18 0720  NA 136 135  K 4.0 3.5  CL 93* 96*  CO2 28 25  GLUCOSE 131* 117*  BUN 28* 29*  CREATININE 5.96* 5.70*  CALCIUM 8.6* 8.3*  PHOS 5.1* 4.8*   GFR: Estimated Creatinine Clearance: 15.2 mL/min (A) (by C-G formula based on SCr of 5.7 mg/dL (H)). Liver Function Tests: Recent Labs  Lab 07/14/18 0720  ALBUMIN 3.1*   No results for input(s): LIPASE, AMYLASE in the last 168 hours. Recent Labs  Lab 07/11/18 2330  AMMONIA 33   Coagulation Profile: No results for input(s): INR, PROTIME in the last 168 hours. Cardiac Enzymes: No results for input(s): CKTOTAL, CKMB, CKMBINDEX, TROPONINI in the last 168 hours. BNP (last 3 results) No results for input(s): PROBNP in the last 8760 hours. HbA1C: No results for input(s): HGBA1C in the last 72 hours. CBG: Recent Labs  Lab 07/13/18 2116 07/14/18 1250 07/14/18 1754 07/14/18 2058 07/15/18 0731  GLUCAP 143* 141* 184* 155* 123*   Lipid Profile: No results for input(s): CHOL, HDL, LDLCALC, TRIG, CHOLHDL, LDLDIRECT in the last 72 hours. Thyroid Function Tests: No results for input(s): TSH, T4TOTAL, FREET4, T3FREE, THYROIDAB in the last 72 hours. Anemia Panel: No results for input(s): VITAMINB12, FOLATE, FERRITIN, TIBC, IRON, RETICCTPCT in the last 72  hours. Sepsis Labs: No results for input(s): PROCALCITON, LATICACIDVEN in the last 168 hours.  Recent Results (from the past 240 hour(s))  MRSA PCR Screening     Status: None   Collection Time: 07/11/18  9:33 PM  Result Value Ref Range Status   MRSA by PCR NEGATIVE NEGATIVE Final    Comment:        The GeneXpert MRSA Assay (FDA approved for NASAL specimens only), is one component of a comprehensive MRSA colonization surveillance program. It is not intended to diagnose MRSA infection nor to guide or monitor treatment for MRSA infections. Performed at [facility], [place] [address], [place],  [address]       Radiology Studies: Vas [place] Abi With/wo Tbi  Result Date: 07/14/2018 LOWER EXTREMITY DOPPLER STUDY Indications: Ulceration. High Risk Factors: Diabetes.  Limitations: IV and AVF graft on site; ulcer patches on ankle, patient cannot              tolerate the pressure cuff's compression. Comparison Study: No comparison study available Performing Technologist: Rudell Cobb RDMS RVT  Examination Guidelines: A complete evaluation includes at minimum, Doppler waveform signals and systolic blood pressure reading at the level of bilateral brachial, anterior tibial, and posterior tibial arteries, when vessel segments are accessible. Bilateral testing is considered an integral part of a complete examination. Photoelectric Plethysmograph (PPG) waveforms and toe systolic pressure readings are included as required and additional duplex testing as needed. Limited examinations for reoccurring indications may be performed as noted.  ABI Findings: +---------+------------------+-----+----------+--------------------------------+ Right    Rt Pressure (mmHg)IndexWaveform  Comment                          +---------+------------------+-----+----------+--------------------------------+ Brachial 141                    triphasic                                   +---------+------------------+-----+----------+--------------------------------+ PTA                             monophasicpressure unable to obtain due to  patient has ulcer and patches on                                           ankle                            +---------+------------------+-----+----------+--------------------------------+ DP                              monophasicpressure unable to obtain due to                                           patient has ulcer and patches on                                           ankle                            +---------+------------------+-----+----------+--------------------------------+ Great Toe96                0.68                                            +---------+------------------+-----+----------+--------------------------------+ +---------+------------------+-----+-------------------+-----------------------+ Left     Lt Pressure (mmHg)IndexWaveform           Comment                 +---------+------------------+-----+-------------------+-----------------------+ Brachial                        triphasic          AVF graft on site with                                                     bandages                +---------+------------------+-----+-------------------+-----------------------+ PTA      144               1.02 monophasic                                 +---------+------------------+-----+-------------------+-----------------------+ DP       44                0.31 dampened monophasic                        +---------+------------------+-----+-------------------+-----------------------+ Great Toe78                0.55                                            +---------+------------------+-----+-------------------+-----------------------+ +-------+-----------+-----------+------------+------------+  ABI/TBIToday's  ABIToday's TBIPrevious ABIPrevious TBI +-------+-----------+-----------+------------+------------+ Right             0.68                                +-------+-----------+-----------+------------+------------+ Left   1.02       0.55                                +-------+-----------+-----------+------------+------------+  Summary: Right: The right toe-brachial index is abnormal. Left: Resting left ankle-brachial index is within normal range. No evidence of significant left lower extremity arterial disease. The left toe-brachial index is abnormal.  *See table(s) above for measurements and observations.     Preliminary       LOS: 4 days   Time spent: More than 50% of that time was spent in counseling and/or coordination of care.  Antonieta Pert, MD [facility]  07/15/2018, 9:55 AM

## 2018-07-16 LAB — GLUCOSE, CAPILLARY
Glucose-Capillary: 100 mg/dL — ABNORMAL HIGH (ref 70–99)
Glucose-Capillary: 147 mg/dL — ABNORMAL HIGH (ref 70–99)
Glucose-Capillary: 176 mg/dL — ABNORMAL HIGH (ref 70–99)
Glucose-Capillary: 143 mg/dL — ABNORMAL HIGH (ref 70–99)

## 2018-07-16 NOTE — Progress Notes (Signed)
[facility] Progress Note   Dialysis Orders:  MWF -Gregory Hood 4.5hrs, [id number], [id number], EDW 108kg,2K/2.25Ca Access:LU AVF Heparin5000 unit bolus Mircera85mcg q2wks - last 06/07/18 Calcitriol0.76mcg PO qHD  Assessment/Plan: 1. Acute encephalopathy -infection vs. HTN. Labs reassuring. RPR negative.unsure of baseline mental function, per family worse than usual over the last week.CT head on 1/15 w/no acute findings. Improved. 2. Cellulitis of R leg w/R shin ulcer - OnVanc - nontender -  3. ESRD - On HD MWF.K 3.5 Friday - requiring added K bath - next HD Monday 4. Hypertension/volume - BPup some net UF 3 L Wed and again Friday - CXR neg for volume - post wt 105 kg -lower edw for discharge - BP in the 130s- plan continue to lower volume - still LE edema 5. Anemia of CKD - Hgb 12.0> 11.5. No indication for ESA at this time 6. Secondary Hyperparathyroidism - Ca and phos in goal. Continue VDRA. Not on binders 7. Nutrition - Renal diet w/fluid restrictions. Vitamins.alb 2.9 - add nepro 8.   Disp - CM/SW working with patient and family - see 1/17 note for details; will need to change dialysis units if transferred out of area - will discuss with SW/CM Monday  Myriam Jacobson, PA-C [facility] Beeper [phone] 07/16/2018,7:58 AM  LOS: 5 days   Subjective:   No c/o eating breakfast. Asking if leg is better. Tells me he is having BMs.  Objective Vitals:   07/15/18 0438 07/15/18 1102 07/15/18 2129 07/16/18 0338  BP: 139/83 137/73 133/69 136/75  Pulse: 79 70 70 71  Resp: 16 16 18 20   Temp:  97.9 F (36.6 C)  98.9 F (37.2 C)  TempSrc:  Oral  Oral  SpO2: 94% 98% 100% 96%  Weight:   105.4 kg   Height:       Physical Exam General: NAD eating breakfast Heart: RRR Lungs: no rales Abdomen: obese, distended nontender Extremities: LLE thickened skin- tr edema, 1-2+ on right dressing mid shin Dialysis Access: left upper AVF +  bruit   Additional Objective Labs: Basic Metabolic Panel: Recent Labs  Lab 07/12/18 0504 07/14/18 0720  NA 136 135  K 4.0 3.5  CL 93* 96*  CO2 28 25  GLUCOSE 131* 117*  BUN 28* 29*  CREATININE 5.96* 5.70*  CALCIUM 8.6* 8.3*  PHOS 5.1* 4.8*   Liver Function Tests: Recent Labs  Lab 07/14/18 0720  ALBUMIN 3.1*   No results for input(s): LIPASE, AMYLASE in the last 168 hours. CBC: Recent Labs  Lab 07/11/18 2330 07/12/18 0504 07/14/18 0720  WBC  --  7.0 6.3  NEUTROABS  --  5.7  --   HGB  --  12.0* 11.5*  HCT 38.2 38.0* 36.7*  MCV  --  85.8 85.2  PLT  --  140* 168   Blood Culture No results found for: SDES, SPECREQUEST, CULT, REPTSTATUS  Cardiac Enzymes: No results for input(s): CKTOTAL, CKMB, CKMBINDEX, TROPONINI in the last 168 hours. CBG: Recent Labs  Lab 07/15/18 0731 07/15/18 1136 07/15/18 1640 07/15/18 2127 07/16/18 0719  GLUCAP 123* 146* 151* 114* 100*   Iron Studies: No results for input(s): IRON, TIBC, TRANSFERRIN, FERRITIN in the last 72 hours. Lab Results  Component Value Date   INR 0.95 03/08/2010   Studies/Results: Vas [place] Burnard Bunting With/wo Tbi  Result Date: 07/14/2018 LOWER EXTREMITY DOPPLER STUDY Indications: Ulceration. High Risk Factors: Diabetes.  Limitations: IV and AVF graft on site; ulcer patches on ankle, patient cannot  tolerate the pressure cuff's compression. Comparison Study: No comparison study available Performing Technologist: Rudell Cobb RDMS RVT  Examination Guidelines: A complete evaluation includes at minimum, Doppler waveform signals and systolic blood pressure reading at the level of bilateral brachial, anterior tibial, and posterior tibial arteries, when vessel segments are accessible. Bilateral testing is considered an integral part of a complete examination. Photoelectric Plethysmograph (PPG) waveforms and toe systolic pressure readings are included as required and additional duplex testing as needed. Limited  examinations for reoccurring indications may be performed as noted.  ABI Findings: +---------+------------------+-----+----------+--------------------------------+ Right    Rt Pressure (mmHg)IndexWaveform  Comment                          +---------+------------------+-----+----------+--------------------------------+ Brachial 141                    triphasic                                  +---------+------------------+-----+----------+--------------------------------+ PTA                             monophasicpressure unable to obtain due to                                           patient has ulcer and patches on                                           ankle                            +---------+------------------+-----+----------+--------------------------------+ DP                              monophasicpressure unable to obtain due to                                           patient has ulcer and patches on                                           ankle                            +---------+------------------+-----+----------+--------------------------------+ Great Toe96                0.68                                            +---------+------------------+-----+----------+--------------------------------+ +---------+------------------+-----+-------------------+-----------------------+ Left     Lt Pressure (mmHg)IndexWaveform           Comment                 +---------+------------------+-----+-------------------+-----------------------+ Brachial  triphasic          AVF graft on site with                                                     bandages                +---------+------------------+-----+-------------------+-----------------------+ PTA      144               1.02 monophasic                                 +---------+------------------+-----+-------------------+-----------------------+ DP        44                0.31 dampened monophasic                        +---------+------------------+-----+-------------------+-----------------------+ Great Toe78                0.55                                            +---------+------------------+-----+-------------------+-----------------------+ +-------+-----------+-----------+------------+------------+ ABI/TBIToday's ABIToday's TBIPrevious ABIPrevious TBI +-------+-----------+-----------+------------+------------+ Right             0.68                                +-------+-----------+-----------+------------+------------+ Left   1.02       0.55                                +-------+-----------+-----------+------------+------------+  Summary: Right: The right toe-brachial index is abnormal. Left: Resting left ankle-brachial index is within normal range. No evidence of significant left lower extremity arterial disease. The left toe-brachial index is abnormal.  *See table(s) above for measurements and observations.     Preliminary    Medications: . sodium chloride    . sodium chloride Stopped (07/12/18 0318)  . vancomycin Stopped (07/14/18 1124)   . amLODipine  10 mg Oral QPC supper  . aspirin EC  81 mg Oral Daily  . atropine  1 drop Left Eye QHS  . calcitRIOL  0.25 mcg Oral Daily  . carvedilol  25 mg Oral BID  . Chlorhexidine Gluconate Cloth  6 each Topical Q0600  . clopidogrel  75 mg Oral Daily  . doxycycline  100 mg Oral Q12H  . ezetimibe  10 mg Oral QPM  . feeding supplement (NEPRO CARB STEADY)  237 mL Oral BID BM  . finasteride  5 mg Oral Daily  . folic acid  2 mg Oral Daily  . hydrALAZINE  50 mg Oral Q8H  . insulin aspart  0-5 Units Subcutaneous QHS  . insulin aspart  0-9 Units Subcutaneous TID WC  . polyethylene glycol  17 g Oral BID  . prednisoLONE acetate  1 drop Left Eye QHS  . QUEtiapine  12.5 mg Oral QHS  . senna-docusate  2 tablet Oral Daily  . sodium chloride flush  3 mL  Intravenous Q12H

## 2018-07-16 NOTE — NC FL2 (Signed)
[place] MEDICAID FL2 LEVEL OF CARE SCREENING TOOL     IDENTIFICATION  Patient Name: Gregory Hood Birthdate: 12-21-1950 Sex: male Admission Date (Current Location): 07/11/2018  [facility] and [place] Number:  Herbalist and Address:  [facility], [place] [address] [place], [place] [address]      Provider Number: [id number]  Attending Physician Name and Address:  Antonieta Pert, MD  Relative Name and Phone Number:       Current Level of Care: Hospital Recommended Level of Care: [place] Prior Approval Number:    Date Approved/Denied:   PASRR Number:   [id number]  Discharge Plan: SNF    Current Diagnoses: Patient Active Problem List   Diagnosis Date Noted  . History of completed stroke 07/11/2018  . Acute encephalopathy 07/11/2018  . Cellulitis of right leg 07/11/2018  . Hypertensive urgency 07/11/2018  . Acute exacerbation of CHF (congestive heart failure) ([place]) 03/22/2018  . CVA (cerebral vascular accident) ([place]) 03/22/2018  . OSA (obstructive sleep apnea) 03/22/2018  . BPH (benign prostatic hyperplasia) 03/22/2018  . Thrombocytopenia ([place]) 03/22/2018  . Normocytic anemia 03/22/2018  . Metabolic acidosis 47/65/4650  . Uremia 03/22/2018  . End stage renal disease ([place]) 03/14/2014  . Orthostatic hypotension 02/22/2014  . Elevated troponin 02/22/2014  . Chronic diastolic heart failure ([place]) 02/22/2014  . Elevated lipids 02/22/2014  . Essential hypertension, benign 02/17/2014  . DM (diabetes mellitus), type 2 with renal complications ([place]) 35/46/5681  . Abnormal ECG 02/17/2014    Orientation RESPIRATION BLADDER Height & Weight     Self  Normal(on room air. ) Incontinent Weight: 232 lb 5.8 oz (105.4 kg) Height:  5\' 9"  (175.3 cm)  BEHAVIORAL SYMPTOMS/MOOD NEUROLOGICAL BOWEL NUTRITION STATUS      Incontinent Diet(please see discharge summary. )  AMBULATORY STATUS COMMUNICATION OF NEEDS Skin   Extensive Assist Verbally  Other (Comment)(right ulcer. )                       Personal Care Assistance Level of Assistance  Bathing, Feeding, Dressing Bathing Assistance: Maximum assistance Feeding assistance: Limited assistance Dressing Assistance: Maximum assistance     Functional Limitations Info  Sight, Hearing, Speech Sight Info: Adequate Hearing Info: Adequate Speech Info: Adequate    SPECIAL CARE FACTORS FREQUENCY  PT (By licensed PT), OT (By licensed OT)     PT Frequency: 5 times a week OT Frequency: 5 times a week             Contractures Contractures Info: Not present    Additional Factors Info  Code Status, Allergies Code Status Info: FULL  Allergies Info: Bee Venom, Lexiscan Regadenoson, Ace Inhibitors           Current Medications (07/16/2018):  This is the current hospital active medication list Current Facility-Administered Medications  Medication Dose Route Frequency Provider Last Rate Last Dose  . 0.9 %  sodium chloride infusion  250 mL Intravenous PRN Opyd, Ilene Qua, MD      . 0.9 %  sodium chloride infusion   Intravenous PRN Opyd, Ilene Qua, MD   Stopped at 07/12/18 0318  . acetaminophen (TYLENOL) tablet 650 mg  650 mg Oral Q6H PRN Opyd, Ilene Qua, MD   650 mg at 07/15/18 0355   Or  . acetaminophen (TYLENOL) suppository 650 mg  650 mg Rectal Q6H PRN Opyd, Ilene Qua, MD      . amLODipine (NORVASC) tablet 10 mg  10 mg  Oral QPC supper Penninger, [place], [place]   10 mg at 07/15/18 1752  . aspirin EC tablet 81 mg  81 mg Oral Daily Opyd, Ilene Qua, MD   81 mg at 07/16/18 0913  . atropine 1 % ophthalmic solution 1 drop  1 drop Left Eye Cloretta Ned, MD   1 drop at 07/15/18 2212  . calcitRIOL (ROCALTROL) capsule 0.25 mcg  0.25 mcg Oral Daily Opyd, Ilene Qua, MD   0.25 mcg at 07/16/18 0913  . carvedilol (COREG) tablet 25 mg  25 mg Oral BID Vianne Bulls, MD   25 mg at 07/16/18 0913  . Chlorhexidine Gluconate Cloth 2 % PADS 6 each  6 each Topical Q0600 Corliss Parish,  MD   6 each at 07/16/18 0556  . clopidogrel (PLAVIX) tablet 75 mg  75 mg Oral Daily Opyd, Ilene Qua, MD   75 mg at 07/16/18 0913  . doxycycline (VIBRA-TABS) tablet 100 mg  100 mg Oral Q12H Kc, Ramesh, MD   100 mg at 07/16/18 0913  . ezetimibe (ZETIA) tablet 10 mg  10 mg Oral QPM Opyd, Ilene Qua, MD   10 mg at 07/15/18 1752  . feeding supplement (NEPRO CARB STEADY) liquid 237 mL  237 mL Oral BID BM Alric Seton, PA-C      . finasteride (PROSCAR) tablet 5 mg  5 mg Oral Daily Opyd, Ilene Qua, MD   5 mg at 07/16/18 0913  . folic acid (FOLVITE) tablet 2 mg  2 mg Oral Daily Opyd, Ilene Qua, MD   2 mg at 07/16/18 0913  . haloperidol lactate (HALDOL) injection 1 mg  1 mg Intravenous Q6H PRN Opyd, Ilene Qua, MD      . hydrALAZINE (APRESOLINE) injection 10 mg  10 mg Intravenous Q4H PRN Opyd, Ilene Qua, MD   10 mg at 07/12/18 1852  . HYDROcodone-acetaminophen (NORCO/VICODIN) 5-325 MG per tablet 1 tablet  1 tablet Oral Q4H PRN Opyd, Ilene Qua, MD   1 tablet at 07/16/18 0040  . insulin aspart (novoLOG) injection 0-5 Units  0-5 Units Subcutaneous QHS Opyd, Timothy S, MD      . insulin aspart (novoLOG) injection 0-9 Units  0-9 Units Subcutaneous TID WC Opyd, Ilene Qua, MD   2 Units at 07/15/18 1642  . labetalol (NORMODYNE,TRANDATE) injection 10 mg  10 mg Intravenous Q6H PRN Kc, Ramesh, MD      . ondansetron (ZOFRAN) tablet 4 mg  4 mg Oral Q6H PRN Opyd, Ilene Qua, MD       Or  . ondansetron (ZOFRAN) injection 4 mg  4 mg Intravenous Q6H PRN Opyd, Ilene Qua, MD      . polyethylene glycol (MIRALAX / GLYCOLAX) packet 17 g  17 g Oral BID Opyd, Ilene Qua, MD   17 g at 07/16/18 0912  . prednisoLONE acetate (PRED FORTE) 1 % ophthalmic suspension 1 drop  1 drop Left Eye Cloretta Ned, MD   1 drop at 07/15/18 2212  . QUEtiapine (SEROQUEL) tablet 12.5 mg  12.5 mg Oral QHS Kc, Ramesh, MD   12.5 mg at 07/15/18 2211  . senna-docusate (Senokot-S) tablet 2 tablet  2 tablet Oral Daily Opyd, Ilene Qua, MD   2 tablet at  07/16/18 0913  . sodium chloride flush (NS) 0.9 % injection 3 mL  3 mL Intravenous Q12H Opyd, Ilene Qua, MD   3 mL at 07/16/18 0911  . sodium chloride flush (NS) 0.9 % injection 3 mL  3 mL Intravenous PRN Opyd, Christia Reading  S, MD      . vancomycin (VANCOCIN) IVPB 1000 mg/200 mL premix  1,000 mg Intravenous Q M,W,F-HD Theotis Burrow, RPH   Stopped at 07/14/18 1124     Discharge Medications: Please see discharge summary for a list of discharge medications.  Relevant Imaging Results:  Relevant Lab Results:   Additional Information [phone].  Wetzel Bjornstad, LCSWA

## 2018-07-16 NOTE — Progress Notes (Signed)
PROGRESS NOTE    Gregory Hood  IWL:[id number] DOB: 03-20-1951 DOA: 07/11/2018 PCP: Jilda Panda, MD   Hospital course: 68 year old male with history of ESRD on HD MWF, chronic diastolic CHF, hypertension, history of CVA, chronic lower extremity wounds, Alzheimer dementia, legal blindness lives at home with wife presented to [facility] ER for evaluation of confusion and fevers.  As per wife patient has been confused for roughly 2 days, went to dialysis on 1/13, reportedly completed the session but was noted to have fever of 102 with some agitation and possible confusion.  Patient having mild drainage from his wound on the right shin.  At home patient does relate with assistance. As per the granddaughter he has been having memory issues for past 2 to 3 years and for last 1 week has been worsening, making random calls over phone and not being himself.  In the ER, [facility] EKG with sinus rhythm, chest x-ray pulmonary vascular congestion but no edema or consolidation, CT head negative for acute abnormality, routine lab work CBC fairly stable, influenza PCR negative troponin was borderline positive, UA notes history of infection.1 L normal saline, Zyvox and Haldol in the ED.  He was transferred to [facility]. She was admitted, managed with IV antibiotics for this wound.  It is nicely improving, no more erythema redness present on his right lower leg.  His mental status seems overall stable and improved.Seen by PT OT and has recommended home health and will set up home health PT OT RN and aide on discharge.  Assessment & Plan:   Cellulitis of right leg with right shin ulcer w some draiange : Wound is improving nicely, continue on current local wound care as per wound care nurse.  Last vanco dose 1/17.  Now on oral doxycycline.  He has very good dopplerable pulses ABI difficult to be obtained due to his leg wound, will need outpatient vascular surgery referral.    Acute encephalopathy, likely toxic  metabolic in the setting of dementia. Family notes that he has memory issues and has been acting unusual for last 1 week.Repeat CT head is unremarkable for acute finding.  Has PM and MRI may not be feasible.  His mental status overall improved and appears to be somewhat closer to baseline-he is alert, awake, oriented to place, self, people, communicative.  He does have baseline dementia/memory issues. W/u with Ammonia, tsh, B12 Normal.  RPR, hiv  Also neg. continue supportive care with home meds and family at home.  CT head x2 without any acute finding. Cont low dose Seroquel bedtime.  Fall,?  Left forehead injury.  No obvious injury on the head and CT head on repeat was unremarkable.  Educated on fall precaution and safety.  PT OT appreciated.  Look into skilled nursing facility.  Hypertensive urgency : BP stable.  Stopped hydralazine by nephro.  Continue on Coreg, amlodipine.    DM (diabetes mellitus), type 2 with renal complications: Controlled, cont current insulin insulin.   Chronic diastolic heart failure : Compensated, continue on Lasix  End stage renal disease on HD MWF: Due for dialysis on Monday.   History of stroke: Overall nonfocal and stable.  He is on aspirin, Plavix and Zetia.  CT HEAD x2 unremarkable for acute finding  DVT prophylaxis: SQ HEPARIN  Code Status: FULL Family Communication: Wife was communicated Disposition Plan: Looking into skilled nursing facility as family unable to provide 24 x7 care at home  Consultants: Nephrology  Procedures: Hemodialysis  Antimicrobials: Vancomycin 07/11/2018 >>07/15/17  Subjective: Resting comfortably.  Having his meal.  Denies any new complaints. Overall he feels improved able to communicate and interact well.  Objective: Vitals:   07/15/18 1102 07/15/18 2129 07/16/18 0338 07/16/18 0910  BP: 137/73 133/69 136/75 118/60  Pulse: 70 70 71 71  Resp: 16 18 20 20   Temp: 97.9 F (36.6 C)  98.9 F (37.2 C) (!) 97.4 F (36.3  C)  TempSrc: Oral  Oral Oral  SpO2: 98% 100% 96% 95%  Weight:  105.4 kg    Height:        Intake/Output Summary (Last 24 hours) at 07/16/2018 0957 Last data filed at 07/16/2018 0911 Gross per 24 hour  Intake 603 ml  Output 200 ml  Net 403 ml   Filed Weights   07/14/18 0655 07/14/18 1135 07/15/18 2129  Weight: 109 kg 105 kg 105.4 kg   Weight change: 0.4 kg  Body mass index is 34.31 kg/m.  Examination: General exam: Calm, comfortable, not in acute distress, older for age, average built.  HEENT:Oral mucosa moist, Ear/Nose WNL grossly, dentition normal. Respiratory system: Bilateral equal air entry, no crackles and wheezing, no use of accessory muscle, non tender on palpation. Cardiovascular system: regular rate and rhythm, S1 & S2 heard, No JVD/murmurs. Gastrointestinal system: Abdomen soft, non-tender, non-distended, BS +. No hepatosplenomegaly palpable. Nervous System:Alert, awake and oriented to self place current president and appears at baseline. Able to move UE and LE, sensation intact. Extremities: No edema, distal peripheral pulses palpable.  Right anterior shin with healing ulcer. Skin: No rashes,no icterus.  Chronic hyperpigmentation skin changes on the legs. MSK: Normal muscle bulk,tone, power LUE w AVF  Medications:  Scheduled Meds: . amLODipine  10 mg Oral QPC supper  . aspirin EC  81 mg Oral Daily  . atropine  1 drop Left Eye QHS  . calcitRIOL  0.25 mcg Oral Daily  . carvedilol  25 mg Oral BID  . Chlorhexidine Gluconate Cloth  6 each Topical Q0600  . clopidogrel  75 mg Oral Daily  . doxycycline  100 mg Oral Q12H  . ezetimibe  10 mg Oral QPM  . feeding supplement (NEPRO CARB STEADY)  237 mL Oral BID BM  . finasteride  5 mg Oral Daily  . folic acid  2 mg Oral Daily  . insulin aspart  0-5 Units Subcutaneous QHS  . insulin aspart  0-9 Units Subcutaneous TID WC  . polyethylene glycol  17 g Oral BID  . prednisoLONE acetate  1 drop Left Eye QHS  . QUEtiapine   12.5 mg Oral QHS  . senna-docusate  2 tablet Oral Daily  . sodium chloride flush  3 mL Intravenous Q12H   Continuous Infusions: . sodium chloride    . sodium chloride Stopped (07/12/18 0318)  . vancomycin Stopped (07/14/18 1124)    Data Reviewed: I have personally reviewed following labs and imaging studies  CBC: Recent Labs  Lab 07/11/18 2330 07/12/18 0504 07/14/18 0720  WBC  --  7.0 6.3  NEUTROABS  --  5.7  --   HGB  --  12.0* 11.5*  HCT 38.2 38.0* 36.7*  MCV  --  85.8 85.2  PLT  --  140* 130   Basic Metabolic Panel: Recent Labs  Lab 07/12/18 0504 07/14/18 0720  NA 136 135  K 4.0 3.5  CL 93* 96*  CO2 28 25  GLUCOSE 131* 117*  BUN 28* 29*  CREATININE 5.96* 5.70*  CALCIUM 8.6* 8.3*  PHOS 5.1* 4.8*  GFR: Estimated Creatinine Clearance: 15.3 mL/min (A) (by C-G formula based on SCr of 5.7 mg/dL (H)). Liver Function Tests: Recent Labs  Lab 07/14/18 0720  ALBUMIN 3.1*   No results for input(s): LIPASE, AMYLASE in the last 168 hours. Recent Labs  Lab 07/11/18 2330  AMMONIA 33   Coagulation Profile: No results for input(s): INR, PROTIME in the last 168 hours. Cardiac Enzymes: No results for input(s): CKTOTAL, CKMB, CKMBINDEX, TROPONINI in the last 168 hours. BNP (last 3 results) No results for input(s): PROBNP in the last 8760 hours. HbA1C: No results for input(s): HGBA1C in the last 72 hours. CBG: Recent Labs  Lab 07/15/18 0731 07/15/18 1136 07/15/18 1640 07/15/18 2127 07/16/18 0719  GLUCAP 123* 146* 151* 114* 100*   Lipid Profile: No results for input(s): CHOL, HDL, LDLCALC, TRIG, CHOLHDL, LDLDIRECT in the last 72 hours. Thyroid Function Tests: No results for input(s): TSH, T4TOTAL, FREET4, T3FREE, THYROIDAB in the last 72 hours. Anemia Panel: No results for input(s): VITAMINB12, FOLATE, FERRITIN, TIBC, IRON, RETICCTPCT in the last 72 hours. Sepsis Labs: No results for input(s): PROCALCITON, LATICACIDVEN in the last 168 hours.  Recent  Results (from the past 240 hour(s))  MRSA PCR Screening     Status: None   Collection Time: 07/11/18  9:33 PM  Result Value Ref Range Status   MRSA by PCR NEGATIVE NEGATIVE Final    Comment:        The GeneXpert MRSA Assay (FDA approved for NASAL specimens only), is one component of a comprehensive MRSA colonization surveillance program. It is not intended to diagnose MRSA infection nor to guide or monitor treatment for MRSA infections. Performed at [facility], [place] [address], [place], [place] [address]       Radiology Studies: Vas [place] Abi With/wo Tbi  Result Date: 07/14/2018 LOWER EXTREMITY DOPPLER STUDY Indications: Ulceration. High Risk Factors: Diabetes.  Limitations: IV and AVF graft on site; ulcer patches on ankle, patient cannot              tolerate the pressure cuff's compression. Comparison Study: No comparison study available Performing Technologist: Rudell Cobb RDMS RVT  Examination Guidelines: A complete evaluation includes at minimum, Doppler waveform signals and systolic blood pressure reading at the level of bilateral brachial, anterior tibial, and posterior tibial arteries, when vessel segments are accessible. Bilateral testing is considered an integral part of a complete examination. Photoelectric Plethysmograph (PPG) waveforms and toe systolic pressure readings are included as required and additional duplex testing as needed. Limited examinations for reoccurring indications may be performed as noted.  ABI Findings: +---------+------------------+-----+----------+--------------------------------+ Right    Rt Pressure (mmHg)IndexWaveform  Comment                          +---------+------------------+-----+----------+--------------------------------+ Brachial 141                    triphasic                                  +---------+------------------+-----+----------+--------------------------------+ PTA                             monophasicpressure  unable to obtain due to  patient has ulcer and patches on                                           ankle                            +---------+------------------+-----+----------+--------------------------------+ DP                              monophasicpressure unable to obtain due to                                           patient has ulcer and patches on                                           ankle                            +---------+------------------+-----+----------+--------------------------------+ Great Toe96                0.68                                            +---------+------------------+-----+----------+--------------------------------+ +---------+------------------+-----+-------------------+-----------------------+ Left     Lt Pressure (mmHg)IndexWaveform           Comment                 +---------+------------------+-----+-------------------+-----------------------+ Brachial                        triphasic          AVF graft on site with                                                     bandages                +---------+------------------+-----+-------------------+-----------------------+ PTA      144               1.02 monophasic                                 +---------+------------------+-----+-------------------+-----------------------+ DP       44                0.31 dampened monophasic                        +---------+------------------+-----+-------------------+-----------------------+ Great Toe78                0.55                                            +---------+------------------+-----+-------------------+-----------------------+ +-------+-----------+-----------+------------+------------+  ABI/TBIToday's ABIToday's TBIPrevious ABIPrevious TBI +-------+-----------+-----------+------------+------------+ Right             0.68                                 +-------+-----------+-----------+------------+------------+ Left   1.02       0.55                                +-------+-----------+-----------+------------+------------+  Summary: Right: The right toe-brachial index is abnormal. Left: Resting left ankle-brachial index is within normal range. No evidence of significant left lower extremity arterial disease. The left toe-brachial index is abnormal.  *See table(s) above for measurements and observations.     Preliminary       LOS: 5 days   Time spent: More than 50% of that time was spent in counseling and/or coordination of care.  Antonieta Pert, MD [facility]  07/16/2018, 9:57 AM

## 2018-07-17 LAB — CBC
HCT: 38.1 % — ABNORMAL LOW (ref 39.0–52.0)
Hemoglobin: 12.2 g/dL — ABNORMAL LOW (ref 13.0–17.0)
MCH: 27.7 pg (ref 26.0–34.0)
MCHC: 32 g/dL (ref 30.0–36.0)
MCV: 86.6 fL (ref 80.0–100.0)
NRBC: 0 % (ref 0.0–0.2)
PLATELETS: 194 10*3/uL (ref 150–400)
RBC: 4.4 MIL/uL (ref 4.22–5.81)
RDW: 17.8 % — ABNORMAL HIGH (ref 11.5–15.5)
WBC: 6.9 10*3/uL (ref 4.0–10.5)

## 2018-07-17 LAB — GLUCOSE, CAPILLARY
Glucose-Capillary: 147 mg/dL — ABNORMAL HIGH (ref 70–99)
Glucose-Capillary: 122 mg/dL — ABNORMAL HIGH (ref 70–99)

## 2018-07-17 LAB — RENAL FUNCTION PANEL
Albumin: 2.9 g/dL — ABNORMAL LOW (ref 3.5–5.0)
Anion gap: 13 (ref 5–15)
BUN: 38 mg/dL — ABNORMAL HIGH (ref 8–23)
CALCIUM: 8.2 mg/dL — AB (ref 8.9–10.3)
CO2: 25 mmol/L (ref 22–32)
Chloride: 97 mmol/L — ABNORMAL LOW (ref 98–111)
Creatinine, Ser: 6.34 mg/dL — ABNORMAL HIGH (ref 0.61–1.24)
GFR calc Af Amer: 10 mL/min — ABNORMAL LOW (ref >60)
GFR calc non Af Amer: 8 mL/min — ABNORMAL LOW (ref >60)
Glucose, Bld: 96 mg/dL (ref 70–99)
Phosphorus: 5.1 mg/dL — ABNORMAL HIGH (ref 2.5–4.6)
Potassium: 3.9 mmol/L (ref 3.5–5.1)
SODIUM: 135 mmol/L (ref 135–145)

## 2018-07-17 MED ORDER — ALTEPLASE 2 MG IJ SOLR: 2.0000 mg | Freq: Once | INTRAMUSCULAR | Status: DC | PRN

## 2018-07-17 MED ORDER — SODIUM CHLORIDE 0.9 % IV SOLN: 100.0000 mL | INTRAVENOUS | Status: DC | PRN

## 2018-07-17 MED ORDER — ACETAMINOPHEN 325 MG PO TABS
ORAL_TABLET | ORAL | Status: AC
  Filled 2018-07-17: qty 2

## 2018-07-17 MED ORDER — PENTAFLUOROPROP-TETRAFLUOROETH EX AERO: 1.0000 "application " | INHALATION_SPRAY | CUTANEOUS | Status: DC | PRN

## 2018-07-17 MED ORDER — HEPARIN SODIUM (PORCINE) 1000 UNIT/ML DIALYSIS
20.0000 [IU]/kg | INTRAMUSCULAR | Status: DC | PRN
  Administered 2018-07-17: 2100 [IU] via INTRAVENOUS_CENTRAL

## 2018-07-17 NOTE — Care Management Important Message (Signed)
Important Message  Patient Details  Name: Gregory Hood MRN: [id number] Date of Birth: Aug 23, 1950   Medicare Important Message Given:  No  Left unsigned with the patient as he wanted to speak with wife about the document  Orbie Pyo 07/17/2018, 4:30 PM

## 2018-07-17 NOTE — Progress Notes (Signed)
PROGRESS NOTE    Gregory Hood  IHK:[id number] DOB: 1950-10-14 DOA: 07/11/2018 PCP: Jilda Panda, MD   Hospital course: 68 year old male with history of ESRD on HD MWF, chronic diastolic CHF, hypertension, history of CVA, chronic lower extremity wounds, Alzheimer dementia, legal blindness lives at home with wife presented to [facility] ER for evaluation of confusion and fevers.  As per wife patient has been confused for roughly 2 days, went to dialysis on 1/13, reportedly completed the session but was noted to have fever of 102 with some agitation and possible confusion.  Patient having mild drainage from his wound on the right shin.  At home patient does relate with assistance. As per the granddaughter he has been having memory issues for past 2 to 3 years and for last 1 week has been worsening, making random calls over phone and not being himself.  In the ER, [facility] EKG with sinus rhythm, chest x-ray pulmonary vascular congestion but no edema or consolidation, CT head negative for acute abnormality, routine lab work CBC fairly stable, influenza PCR negative troponin was borderline positive, UA notes history of infection.1 L normal saline, Zyvox and Haldol in the ED.  He was transferred to [facility]. She was admitted, managed with IV antibiotics for this wound.  It is nicely improving, no more erythema redness present on his right lower leg.  His mental status seems overall stable and improved.Seen by PT OT and has recommended home health and will set up home health PT OT RN and aide on discharge.  Assessment & Plan:   Cellulitis of right leg with right shin ulcer w some draiange : Overall nicely improved right shin wound, continue local wound care, continue oral doxycycline. He has very good dopplerable pulses but ABI difficult to be obtained due to his leg wound, will need outpatient vascular surgery referral.    Acute encephalopathy, likely toxic metabolic in the setting of dementia. Family  notes that he has memory issues and has been acting unusual for last 1 week.Repeat CT head is unremarkable for acute finding.  Has PM and MRI may not be feasible.  His mental status overall improved and appears to be  at baseline-alert, awake, oriented to place, self, people and is communicative.  He does have baseline dementia/memory issues. W/u with Ammonia, tsh, B12 ,RPR, hiv are all normal/negative. Continue supportive care with home meds and family at home.  CT head x2 without any acute finding. Cont low dose Seroquel bedtime.  Fall,?  Left forehead injury.  No obvious injury on the head and CT head on repeat was unremarkable.  Educated on fall precaution and safety.  PT OT appreciated.  Look into skilled nursing facility.  Hypertensive urgency : BP stable.  Stopped hydralazine by nephro.  Continue on Coreg, amlodipine.    DM (diabetes mellitus), type 2 with renal complications: Controlled, cont current insulin insulin.   Chronic diastolic heart failure : Compensated, continue on Lasix  End stage renal disease on HD MWF: Due for dialysis on Monday.   History of stroke: Overall nonfocal and stable.  He is on aspirin, Plavix and Zetia.  CT HEAD x2 unremarkable for acute finding  DVT prophylaxis: SQ HEPARIN  Code Status: FULL Family Communication: Wife was communicated Disposition Plan: Awaiting for skilled nursing facility placement.  as family unable to provide 24 x7 care at home  Consultants: Nephrology  Procedures: Hemodialysis  Antimicrobials: Vancomycin 07/11/2018 >>07/15/17  Subjective: Seen and examined in dialysis unit this morning.   Resting  comfortably, alert awake oriented and communicative at baseline. No acute events overnight. On room air.  Objective: Vitals:   07/17/18 1000 07/17/18 1030 07/17/18 1100 07/17/18 1130  BP: 135/73 (!) 129/109 (!) 120/50 (!) 145/82  Pulse: 62 71 64 70  Resp:      Temp:      TempSrc:      SpO2:      Weight:      Height:          Intake/Output Summary (Last 24 hours) at 07/17/2018 1300 Last data filed at 07/17/2018 0715 Gross per 24 hour  Intake 120 ml  Output 0 ml  Net 120 ml   Filed Weights   07/16/18 2053 07/17/18 0500 07/17/18 0732  Weight: 108.4 kg 108.4 kg 105 kg   Weight change: 3.01 kg  Body mass index is 34.18 kg/m.  Examination:  General exam: Calm, comfortable, not in acute distress, older for age, average built.  HEENT:Oral mucosa moist, Ear/Nose WNL grossly, dentition normal. Respiratory system: Bilateral equal air entry, no crackles and wheezing, no use of accessory muscle, non tender on palpation. Cardiovascular system: regular rate and rhythm, S1 & S2 heard, No JVD/murmurs. Gastrointestinal system: Abdomen soft, non-tender, non-distended, BS +. No hepatosplenomegaly palpable. Nervous System:Alert, awake and oriented to self, place at baseline at baseline. Able to move UE and LE, sensation intact. Extremities: No edema, distal peripheral pulses palpable.  Right anterior shin with healing wound Skin: No rashes,no icterus. MSK: Normal muscle bulk,tone, power  Medications:  Scheduled Meds: . amLODipine  10 mg Oral QPC supper  . aspirin EC  81 mg Oral Daily  . atropine  1 drop Left Eye QHS  . calcitRIOL  0.25 mcg Oral Daily  . carvedilol  25 mg Oral BID  . Chlorhexidine Gluconate Cloth  6 each Topical Q0600  . clopidogrel  75 mg Oral Daily  . doxycycline  100 mg Oral Q12H  . ezetimibe  10 mg Oral QPM  . feeding supplement (NEPRO CARB STEADY)  237 mL Oral BID BM  . finasteride  5 mg Oral Daily  . folic acid  2 mg Oral Daily  . insulin aspart  0-5 Units Subcutaneous QHS  . insulin aspart  0-9 Units Subcutaneous TID WC  . polyethylene glycol  17 g Oral BID  . prednisoLONE acetate  1 drop Left Eye QHS  . QUEtiapine  12.5 mg Oral QHS  . senna-docusate  2 tablet Oral Daily  . sodium chloride flush  3 mL Intravenous Q12H   Continuous Infusions: . sodium chloride    . sodium chloride  Stopped (07/12/18 0318)  . sodium chloride    . sodium chloride      Data Reviewed: I have personally reviewed following labs and imaging studies  CBC: Recent Labs  Lab 07/11/18 2330 07/12/18 0504 07/14/18 0720 07/17/18 0808  WBC  --  7.0 6.3 6.9  NEUTROABS  --  5.7  --   --   HGB  --  12.0* 11.5* 12.2*  HCT 38.2 38.0* 36.7* 38.1*  MCV  --  85.8 85.2 86.6  PLT  --  140* 168 035   Basic Metabolic Panel: Recent Labs  Lab 07/12/18 0504 07/14/18 0720 07/17/18 0807  NA 136 135 135  K 4.0 3.5 3.9  CL 93* 96* 97*  CO2 28 25 25   GLUCOSE 131* 117* 96  BUN 28* 29* 38*  CREATININE 5.96* 5.70* 6.34*  CALCIUM 8.6* 8.3* 8.2*  PHOS 5.1* 4.8* 5.1*  GFR: Estimated Creatinine Clearance: 13.7 mL/min (A) (by C-G formula based on SCr of 6.34 mg/dL (H)). Liver Function Tests: Recent Labs  Lab 07/14/18 0720 07/17/18 0807  ALBUMIN 3.1* 2.9*   No results for input(s): LIPASE, AMYLASE in the last 168 hours. Recent Labs  Lab 07/11/18 2330  AMMONIA 33   Coagulation Profile: No results for input(s): INR, PROTIME in the last 168 hours. Cardiac Enzymes: No results for input(s): CKTOTAL, CKMB, CKMBINDEX, TROPONINI in the last 168 hours. BNP (last 3 results) No results for input(s): PROBNP in the last 8760 hours. HbA1C: No results for input(s): HGBA1C in the last 72 hours. CBG: Recent Labs  Lab 07/15/18 2127 07/16/18 0719 07/16/18 1142 07/16/18 1635 07/16/18 2052  GLUCAP 114* 100* 147* 176* 143*   Lipid Profile: No results for input(s): CHOL, HDL, LDLCALC, TRIG, CHOLHDL, LDLDIRECT in the last 72 hours. Thyroid Function Tests: No results for input(s): TSH, T4TOTAL, FREET4, T3FREE, THYROIDAB in the last 72 hours. Anemia Panel: No results for input(s): VITAMINB12, FOLATE, FERRITIN, TIBC, IRON, RETICCTPCT in the last 72 hours. Sepsis Labs: No results for input(s): PROCALCITON, LATICACIDVEN in the last 168 hours.  Recent Results (from the past 240 hour(s))  MRSA PCR  Screening     Status: None   Collection Time: 07/11/18  9:33 PM  Result Value Ref Range Status   MRSA by PCR NEGATIVE NEGATIVE Final    Comment:        The GeneXpert MRSA Assay (FDA approved for NASAL specimens only), is one component of a comprehensive MRSA colonization surveillance program. It is not intended to diagnose MRSA infection nor to guide or monitor treatment for MRSA infections. Performed at [facility], [place] [address] [place], [place] [address]       Radiology Studies: No results found.    LOS: 6 days   Time spent: More than 50% of that time was spent in counseling and/or coordination of care.  Antonieta Pert, MD [facility]  07/17/2018, 1:00 PM

## 2018-07-17 NOTE — Progress Notes (Addendum)
[facility] Progress Note   Dialysis Orders: MWF -[place] 4.5hrs, [id number], [id number], EDW 108kg,2K/2.25Ca Access:LU AVF Heparin5000 unit bolus Mircera54mcg q2wks - last 06/07/18 Calcitriol0.81mcg PO qHD  Assessment/Plan: 1. Acute encephalopathy -infection vs. HTN. Labs reassuring. RPR negative.unsure of baseline mental function, per family worse than usual over the last week.CT head on 1/15 w/no acute findings. Improved. 2. Cellulitis of R leg w/R shin ulcer - OnVanc improving 3. ESRD - On HD MWF.K 3.5 Friday - requiring added K bath- starting on added K - labs pending- avoid benadryl due to hx encephalopathy - if it worsens - will given 12.5 4. Hypertension/volume - BPup some net UF 3 L Wed and again Friday - CXR neg for volume - post wt 105 kg -lower edw fordischarge- BP in the 130s- plan continue to lower volume - still LE edema - needs volume down starting at 3 L - increase to 3.5 if BP remains stable  5. Anemia of CKD - Hgb 12.0> 11.5. No indication for ESA at this time- labs pending 6. Secondary Hyperparathyroidism - Ca and phos in goal. Continue VDRA. Not on binders 7. Nutrition - Renal diet w/fluid restrictions. Vitamins.alb 2.9 - add nepro 8. Disp - CM/SW working with patient and family - see 1/17 note for details; will need to change dialysis units if transferred out of area - will discuss with SW/CM today  Myriam Jacobson, PA-C [place] [phone] 07/17/2018,8:09 AM  LOS: 6 days   Pt seen, examined and agree w A/P as above.  Kelly Splinter MD [facility] pager [phone]   07/17/2018, 9:51 AM    Subjective:   Wants benadryl on HD c/o itching. Speech mumbled.  Objective Vitals:   07/16/18 2050 07/16/18 2053 07/17/18 0423 07/17/18 0500  BP: 139/79  120/64   Pulse: 70  73   Resp: 20  20   Temp: 98.3 F (36.8 C)  (!) 97.5 F (36.4 C)   TempSrc: Oral  Oral   SpO2: 98%  94%    Weight:  108.4 kg  108.4 kg  Height:       Physical Exam General: NAD some swelling left suborbital area Heart: RRR Lungs: soft coarse BS anteriorly Abdomen: obese, distended ontender Extremities: RLE 1+ edema skin flakey, mild erythema, dressing mid shin, tr LLE edema Dialysis Access:  Left AVF Qb400   Additional Objective Labs: Basic Metabolic Panel: Recent Labs  Lab 07/12/18 0504 07/14/18 0720  NA 136 135  K 4.0 3.5  CL 93* 96*  CO2 28 25  GLUCOSE 131* 117*  BUN 28* 29*  CREATININE 5.96* 5.70*  CALCIUM 8.6* 8.3*  PHOS 5.1* 4.8*   Liver Function Tests: Recent Labs  Lab 07/14/18 0720  ALBUMIN 3.1*   No results for input(s): LIPASE, AMYLASE in the last 168 hours. CBC: Recent Labs  Lab 07/11/18 2330 07/12/18 0504 07/14/18 0720  WBC  --  7.0 6.3  NEUTROABS  --  5.7  --   HGB  --  12.0* 11.5*  HCT 38.2 38.0* 36.7*  MCV  --  85.8 85.2  PLT  --  140* 168   Blood Culture No results found for: SDES, SPECREQUEST, CULT, REPTSTATUS  Cardiac Enzymes: No results for input(s): CKTOTAL, CKMB, CKMBINDEX, TROPONINI in the last 168 hours. CBG: Recent Labs  Lab 07/15/18 2127 07/16/18 0719 07/16/18 1142 07/16/18 1635 07/16/18 2052  GLUCAP 114* 100* 147* 176* 143*   Iron Studies: No results for input(s): IRON, TIBC, TRANSFERRIN, FERRITIN in  the last 72 hours. Lab Results  Component Value Date   INR 0.95 03/08/2010   Studies/Results: No results found. Medications: . sodium chloride    . sodium chloride Stopped (07/12/18 0318)  . sodium chloride    . sodium chloride     . acetaminophen      . amLODipine  10 mg Oral QPC supper  . aspirin EC  81 mg Oral Daily  . atropine  1 drop Left Eye QHS  . calcitRIOL  0.25 mcg Oral Daily  . carvedilol  25 mg Oral BID  . Chlorhexidine Gluconate Cloth  6 each Topical Q0600  . clopidogrel  75 mg Oral Daily  . doxycycline  100 mg Oral Q12H  . ezetimibe  10 mg Oral QPM  . feeding supplement (NEPRO CARB STEADY)  237 mL  Oral BID BM  . finasteride  5 mg Oral Daily  . folic acid  2 mg Oral Daily  . insulin aspart  0-5 Units Subcutaneous QHS  . insulin aspart  0-9 Units Subcutaneous TID WC  . polyethylene glycol  17 g Oral BID  . prednisoLONE acetate  1 drop Left Eye QHS  . QUEtiapine  12.5 mg Oral QHS  . senna-docusate  2 tablet Oral Daily  . sodium chloride flush  3 mL Intravenous Q12H

## 2018-07-17 NOTE — Progress Notes (Signed)
PT Cancellation Note  Patient Details Name: Gregory Hood MRN: [id number] DOB: Nov 04, 1950   Cancelled Treatment:    Reason Eval/Treat Not Completed: Patient at procedure or test/unavailable. Pt currently off unit for HD. Will continue to follow and progress PT POC as able.    Thelma Comp 07/17/2018, 9:38 AM   Rolinda Roan, PT, DPT Acute Rehabilitation Services Pager: [phone] Office: [phone]

## 2018-07-17 NOTE — Progress Notes (Signed)
OT Cancellation Note  Patient Details Name: Gregory Hood MRN: [id number] DOB: 09-04-1950   Cancelled Treatment:    Reason Eval/Treat Not Completed: Patient at procedure or test/ unavailable. Pt  In HD.  Lucille Passy, OTR/L Acute Rehabilitation Services Pager [phone] Office [phone]   Lucille Passy M 07/17/2018, 10:33 AM

## 2018-07-18 DIAGNOSIS — N186 End stage renal disease: Secondary | ICD-10-CM

## 2018-07-18 DIAGNOSIS — Z992 Dependence on renal dialysis: Secondary | ICD-10-CM

## 2018-07-18 DIAGNOSIS — I739 Peripheral vascular disease, unspecified: Secondary | ICD-10-CM

## 2018-07-18 LAB — GLUCOSE, CAPILLARY
Glucose-Capillary: 109 mg/dL — ABNORMAL HIGH (ref 70–99)
Glucose-Capillary: 179 mg/dL — ABNORMAL HIGH (ref 70–99)
Glucose-Capillary: 280 mg/dL — ABNORMAL HIGH (ref 70–99)
Glucose-Capillary: 133 mg/dL — ABNORMAL HIGH (ref 70–99)

## 2018-07-18 MED ORDER — QUETIAPINE FUMARATE 25 MG PO TABS: 12.5000 mg | ORAL_TABLET | Freq: Every day | ORAL | Status: DC

## 2018-07-18 MED ORDER — DOXYCYCLINE HYCLATE 100 MG PO TABS: 100.0000 mg | ORAL_TABLET | Freq: Two times a day (BID) | ORAL | 0 refills | Status: AC

## 2018-07-18 MED ORDER — PREDNISOLONE ACETATE 1 % OP SUSP: 1.0000 [drp] | Freq: Every day | OPHTHALMIC | 0 refills | Status: DC

## 2018-07-18 MED ORDER — CHLORHEXIDINE GLUCONATE CLOTH 2 % EX PADS
6.0000 | MEDICATED_PAD | Freq: Every day | CUTANEOUS | Status: DC
  Administered 2018-07-18: 6 via TOPICAL

## 2018-07-18 MED ORDER — SENNOSIDES-DOCUSATE SODIUM 8.6-50 MG PO TABS: 2.0000 | ORAL_TABLET | Freq: Every day | ORAL | Status: DC

## 2018-07-18 NOTE — Progress Notes (Signed)
Occupational Therapy Treatment Patient Details Name: Gregory Hood MRN: [id number] DOB: Jun 22, 1951 Today's Date: 07/18/2018    History of present illness Gregory Hood is a 68 y.o. male with medical history significant for ESRD, chronic diastolic CHF, hypertension, history of CVA, and chronic right lower extremity wounds, admitted due to confusion and fevers thought due to R LE cellulitis.   OT comments  Pt making progress with functional goals. Pt required min guard A for bed mobility to sit EOB using rails and increased time and multimodal cues for sequencing and direction. Pt transferred to [facility] and recliner with mod - min A. Pt performed UB ADLs min A while seated. Pt confused and with Poor safety awareness. OT will continue to follow acutely  Follow Up Recommendations  SNF;Supervision/Assistance - 24 hour    Equipment Recommendations  None recommended by OT    Recommendations for Other Services      Precautions / Restrictions Precautions Precautions: Fall Restrictions Weight Bearing Restrictions: No       Mobility Bed Mobility Overal bed mobility: Needs Assistance Bed Mobility: Supine to Sit     Supine to sit: Min guard     General bed mobility comments: increased time, used rails, multimodal cues  Transfers Overall transfer level: Needs assistance Equipment used: Rolling walker (2 wheeled) Transfers: Sit to/from Stand Sit to Stand: Mod assist;Min assist         General transfer comment: cues for direction and safety.      Balance Overall balance assessment: Needs assistance   Sitting balance-Leahy Scale: Fair     Standing balance support: Bilateral upper extremity supported;During functional activity Standing balance-Leahy Scale: Poor                             ADL either performed or assessed with clinical judgement   ADL Overall ADL's : Needs assistance/impaired     Grooming: Sitting;Min guard   Upper Body Bathing: Minimal  assistance;Sitting   Lower Body Bathing: Maximal assistance Lower Body Bathing Details (indicate cue type and reason): simulated Upper Body Dressing : Sitting;Minimal assistance       Toilet Transfer: Ambulation;Comfort height toilet;BSC;Grab bars;Minimal assistance;Cueing for safety Toilet Transfer Details (indicate cue type and reason): multimodal cues for sequencing and safety Toileting- Clothing Manipulation and Hygiene: Sit to/from stand;Moderate assistance       Functional mobility during ADLs: Minimal assistance;Moderate assistance;Rolling walker       Vision Patient Visual Report: No change from baseline     Perception     Praxis      Cognition Arousal/Alertness: Awake/alert Behavior During Therapy: WFL for tasks assessed/performed Overall Cognitive Status: Impaired/Different from baseline Area of Impairment: Problem solving;Safety/judgement;Attention;Orientation;Memory;Following commands;Awareness                 Orientation Level: Place;Time;Situation     Following Commands: Follows one step commands with increased time Safety/Judgement: Decreased awareness of safety;Decreased awareness of deficits   Problem Solving: Slow processing;Decreased initiation;Requires verbal cues          Exercises     Shoulder Instructions       General Comments      Pertinent Vitals/ Pain       Pain Assessment: No/denies pain Faces Pain Scale: No hurt  Home Living  Prior Functioning/Environment              Frequency  Min 2X/week        Progress Toward Goals  OT Goals(current goals can now be found in the care plan section)  Progress towards OT goals: Progressing toward goals     Plan Discharge plan remains appropriate    Co-evaluation                 AM-PAC OT "6 Clicks" Daily Activity     Outcome Measure   Help from another person eating meals?: A Little Help from  another person taking care of personal grooming?: A Little Help from another person toileting, which includes using toliet, bedpan, or urinal?: A Lot Help from another person bathing (including washing, rinsing, drying)?: A Lot Help from another person to put on and taking off regular upper body clothing?: A Little Help from another person to put on and taking off regular lower body clothing?: A Lot 6 Click Score: 15    End of Session Equipment Utilized During Treatment: Rolling walker;Gait belt;Other (comment)(BSC)  OT Visit Diagnosis: Other abnormalities of gait and mobility (R26.89);Unsteadiness on feet (R26.81);Muscle weakness (generalized) (M62.81);History of falling (Z91.81);Pain;Other symptoms and signs involving cognitive function   Activity Tolerance Patient tolerated treatment well   Patient Left in chair;with call bell/phone within reach;with chair alarm set   Nurse Communication          Time: [phone] OT Time Calculation (min): 24 min  Charges: OT General Charges $OT Visit: 1 Visit OT Treatments $Self Care/Home Management : 8-22 mins $Therapeutic Activity: 8-22 mins     Gregory Hood 07/18/2018, 1:43 PM

## 2018-07-18 NOTE — Progress Notes (Signed)
Physical Therapy Treatment Patient Details Name: Gregory Hood MRN: [id number] DOB: 12/24/1950 Today's Date: 07/18/2018    History of Present Illness Gregory Hood is a 68 y.o. male with medical history significant for ESRD, chronic diastolic CHF, hypertension, history of CVA, and chronic right lower extremity wounds, admitted due to confusion and fevers thought due to R LE cellulitis.    PT Comments    Pt progressing slowly towards physical therapy goals. Pt confused as times and with overall slow processing and poor safety awareness. Pt requires +2 assist and chair follow to attempt safe ambulation with RW. Up to max assist provided to recover from several LOB during gait training. Agree that SNF is most appropriate at this time and updated acute PT recommendations to reflect this change in POC. Will continue to follow.   Follow Up Recommendations  SNF;Supervision/Assistance - 24 hour     Equipment Recommendations  None recommended by PT    Recommendations for Other Services       Precautions / Restrictions Precautions Precautions: Fall Precaution Comments: Impulsive Restrictions Weight Bearing Restrictions: No    Mobility  Bed Mobility Overal bed mobility: Needs Assistance Bed Mobility: Supine to Sit     Supine to sit: Min guard     General bed mobility comments: Pt received sitting up in recliner chair  Transfers Overall transfer level: Needs assistance Equipment used: Rolling walker (2 wheeled) Transfers: Sit to/from Stand Sit to Stand: Mod assist;+2 safety/equipment         General transfer comment: VC's for hand placement on seated surface for safety.   Ambulation/Gait Ambulation/Gait assistance: Max assist Gait Distance (Feet): 75 Feet Assistive device: Rolling walker (2 wheeled) Gait Pattern/deviations: Step-to pattern;Step-through pattern;Decreased stride length;Trunk flexed Gait velocity: Decreased Gait velocity interpretation: 1.31 - 2.62 ft/sec,  indicative of limited community ambulator General Gait Details: Pt required VC's for closer walker proximity, and assist for walker management, balance support, and safety. +2 for close chair follow. Pt with multiple losses of balance where pt stepped outside of walker and required up to max assist to recover.    Stairs             Wheelchair Mobility    Modified Rankin (Stroke Patients Only)       Balance Overall balance assessment: Needs assistance   Sitting balance-Leahy Scale: Fair Sitting balance - Comments: mild posterior bias   Standing balance support: Bilateral upper extremity supported;During functional activity Standing balance-Leahy Scale: Poor Standing balance comment: UE support needed, initial posterior bias                            Cognition Arousal/Alertness: Awake/alert Behavior During Therapy: Impulsive Overall Cognitive Status: Impaired/Different from baseline Area of Impairment: Problem solving;Safety/judgement;Attention;Orientation;Memory;Following commands;Awareness                 Orientation Level: Place;Time;Situation     Following Commands: Follows one step commands with increased time Safety/Judgement: Decreased awareness of safety;Decreased awareness of deficits Awareness: Intellectual Problem Solving: Slow processing;Decreased initiation;Requires verbal cues        Exercises      General Comments        Pertinent Vitals/Pain Pain Assessment: No/denies pain Faces Pain Scale: No hurt    Home Living Family/patient expects to be discharged to:: Private residence Living Arrangements: Spouse/significant other Available Help at Discharge: Family;Available 24 hours/day Type of Home: House Home Access: Ramped entrance   Home Layout: One level Home  Equipment: Gilford Rile - 2 wheels;Walker - 4 wheels;Shower seat;Hospital bed      Prior Function Level of Independence: Needs assistance  Gait / Transfers Assistance  Needed: walks with walker ADL's / Homemaking Assistance Needed: wife assists with shower per pt Comments: No family present on OT eval. Pt is questionable historian.   PT Goals (current goals can now be found in the care plan section) Acute Rehab PT Goals Patient Stated Goal: to get some rest and go home PT Goal Formulation: With patient Time For Goal Achievement: 07/19/18 Potential to Achieve Goals: Good Progress towards PT goals: Progressing toward goals    Frequency    Min 2X/week      PT Plan Discharge plan needs to be updated;Frequency needs to be updated    Co-evaluation              AM-PAC PT "6 Clicks" Mobility   Outcome Measure  Help needed turning from your back to your side while in a flat bed without using bedrails?: A Little Help needed moving from lying on your back to sitting on the side of a flat bed without using bedrails?: A Lot Help needed moving to and from a bed to a chair (including a wheelchair)?: A Lot Help needed standing up from a chair using your arms (e.g., wheelchair or bedside chair)?: A Little Help needed to walk in hospital room?: A Lot Help needed climbing 3-5 steps with a railing? : Total 6 Click Score: 13    End of Session Equipment Utilized During Treatment: Gait belt Activity Tolerance: Patient limited by fatigue Patient left: in chair;with call bell/phone within reach;with chair alarm set;with nursing/sitter in room Nurse Communication: Mobility status PT Visit Diagnosis: Other abnormalities of gait and mobility (R26.89);Muscle weakness (generalized) (M62.81);Other symptoms and signs involving the nervous system (R29.898)     Time: [id number] PT Time Calculation (min) (ACUTE ONLY): 27 min  Charges:  $Gait Training: 23-37 mins                     Rolinda Roan, PT, DPT Acute Rehabilitation Services Pager: [phone] Office: [phone]    Thelma Comp 07/18/2018, 2:55 PM

## 2018-07-18 NOTE — Progress Notes (Addendum)
[facility] Progress Note   Dialysis Orders: MWF -[place] 4.5hrs, [id number], [id number], EDW 108kg,2K/2.25Ca Access:LU AVF Heparin5000 unit bolus Mircera62mcg q2wks - last 06/07/18 Calcitriol0.95mcg PO qHD  Assessment/Plan: 1. Acute encephalopathy -infection vs. HTN. Labs reassuring. RPR negative.unsure of baseline mental function, per family worse than usual over the last week.CT head on 1/15 w/no acute findings. Improved. 2. Cellulitis of R leg w/R shin ulcer - now on doxy 3. ESRD - On HD MWF.K 3.9 Monday - requiring added K bath-next HD Wed 4. Hypertension/volume -net UF 3 L Monday - averaging that amount most tmt - lowering EDW gently -EDW down to 102  5. Anemia of CKD - Hgb 12.0> 11.5>12.2 No indication for ESA at this time- labs pending 6. Secondary Hyperparathyroidism - Ca and phos in goal. Continue VDRA. Not on binders 7. Nutrition - Renal diet w/fluid restrictions. Vitamins.alb 2.9 - add nepro 8. Disp - CM/SW working with patient and family - see 1/17 note for details -being CLIPPED to a [facility] unit in the location of his new SNF  Myriam Jacobson, PA-C [place] [phone] 07/18/2018,10:25 AM  LOS: 7 days   Pt seen, examined and agree w A/P as above.  Kelly Splinter MD [employer] pager [phone]   07/18/2018, 11:23 AM    Subjective:   Tells me he is going to [facility] after d/c. Thinks he is at [facility]  Objective Vitals:   07/17/18 1645 07/17/18 2045 07/18/18 0421 07/18/18 0845  BP: (!) 149/71 (!) 170/92 (!) 139/101 (!) 149/85  Pulse: 81 75 85 89  Resp: (!) 22 20 18 18   Temp: 98 F (36.7 C) 98.3 F (36.8 C) (!) 97.4 F (36.3 C)   TempSrc: Oral Oral Oral   SpO2: 95% 98% 96% 98%  Weight:   102 kg   Height:       Physical Exam General: NAD sitting in chair, dozing off in conversation Heart: RRR Lungs: grossly clear Abdomen: obese, distended nontender +  BS Extremities: RLE tr+ edema skin flakey, mild erythema, dressing mid shin, tr LLE edema Dialysis Access:  Left AVF + bruit   Additional Objective Labs: Basic Metabolic Panel: Recent Labs  Lab 07/12/18 0504 07/14/18 0720 07/17/18 0807  NA 136 135 135  K 4.0 3.5 3.9  CL 93* 96* 97*  CO2 28 25 25   GLUCOSE 131* 117* 96  BUN 28* 29* 38*  CREATININE 5.96* 5.70* 6.34*  CALCIUM 8.6* 8.3* 8.2*  PHOS 5.1* 4.8* 5.1*   Liver Function Tests: Recent Labs  Lab 07/14/18 0720 07/17/18 0807  ALBUMIN 3.1* 2.9*   No results for input(s): LIPASE, AMYLASE in the last 168 hours. CBC: Recent Labs  Lab 07/12/18 0504 07/14/18 0720 07/17/18 0808  WBC 7.0 6.3 6.9  NEUTROABS 5.7  --   --   HGB 12.0* 11.5* 12.2*  HCT 38.0* 36.7* 38.1*  MCV 85.8 85.2 86.6  PLT 140* 168 194   Blood Culture No results found for: SDES, SPECREQUEST, CULT, REPTSTATUS  Cardiac Enzymes: No results for input(s): CKTOTAL, CKMB, CKMBINDEX, TROPONINI in the last 168 hours. CBG: Recent Labs  Lab 07/16/18 1635 07/16/18 2052 07/17/18 1603 07/17/18 2100 07/18/18 0727  GLUCAP 176* 143* 147* 122* 109*   Iron Studies: No results for input(s): IRON, TIBC, TRANSFERRIN, FERRITIN in the last 72 hours. Lab Results  Component Value Date   INR 0.95 03/08/2010   Studies/Results: No results found. Medications: . sodium chloride    . sodium chloride Stopped (07/12/18  9977)   . amLODipine  10 mg Oral QPC supper  . aspirin EC  81 mg Oral Daily  . atropine  1 drop Left Eye QHS  . calcitRIOL  0.25 mcg Oral Daily  . carvedilol  25 mg Oral BID  . Chlorhexidine Gluconate Cloth  6 each Topical Q0600  . clopidogrel  75 mg Oral Daily  . doxycycline  100 mg Oral Q12H  . ezetimibe  10 mg Oral QPM  . feeding supplement (NEPRO CARB STEADY)  237 mL Oral BID BM  . finasteride  5 mg Oral Daily  . folic acid  2 mg Oral Daily  . insulin aspart  0-5 Units Subcutaneous QHS  . insulin aspart  0-9 Units Subcutaneous TID WC  .  polyethylene glycol  17 g Oral BID  . prednisoLONE acetate  1 drop Left Eye QHS  . QUEtiapine  12.5 mg Oral QHS  . senna-docusate  2 tablet Oral Daily  . sodium chloride flush  3 mL Intravenous Q12H

## 2018-07-18 NOTE — Consult Note (Signed)
REASON FOR CONSULT:    Peripheral vascular disease with wound right leg.  The consult is requested by Dr. Lupita Leash.  HPI:   Gregory Hood is a pleasant 68 y.o. male, who was admitted on 07/11/2018 with confusion, fever, and drainage from the right leg.  At the time of admission, the patient reportedly had cellulitis of the right leg but had no fever or leukocytosis.  Patient was treated with antibiotics.  According to the wife the patient has been confused for 2 days and he was admitted with acute encephalopathy.  Currently there is no family available and I did my best to obtain the history from the patient however he appears to be confused and I do not think I can get any meaningful history from him.  He does not describe any claudication, rest pain, or nonhealing ulcers, however again I think the history is unreliable.  He tells me that he smoked in the past but does not smoke now.  His past medical history is significant for end-stage renal disease and he dialyzes on Monday Wednesdays and Fridays.  He also has a history of hypertension and has had poorly controlled hypertension in the past.  He is had previous CVA.  He also has chronic diastolic congestive heart failure.  Past Medical History:  Diagnosis Date  . Chronic kidney disease   . COPD (chronic obstructive pulmonary disease) ([place])   . Diabetes mellitus without complication ([place])   . Diabetic retinopathy ([place])   . Hyperlipidemia   . Hypertension   . Obesity   . Orthostatic hypotension   . OSA on CPAP   . Retinal detachment   . Seizures ([place])   . Stroke ([place])   . TIA (transient ischemic attack)   . Umbilical hernia     Family History  Problem Relation Age of Onset  . Hypertension Mother   . Diabetes type II Mother   . Transient ischemic attack Mother   . Kidney disease Mother   . Diabetes Mother   . Varicose Veins Mother   . Hypertension Father   . Diabetes type II Father   . Diabetes Father   . Diabetes type II Sister    . Hypertension Sister   . Diabetes Sister   . Learning disabilities Maternal Uncle     SOCIAL HISTORY: Social History   Socioeconomic History  . Marital status: Married    Spouse name: Not on file  . Number of children: Not on file  . Years of education: Not on file  . Highest education level: Not on file  Occupational History  . Not on file  Social Needs  . Financial resource strain: Not on file  . Food insecurity:    Worry: Not on file    Inability: Not on file  . Transportation needs:    Medical: Not on file    Non-medical: Not on file  Tobacco Use  . Smoking status: Former Smoker    Types: Cigarettes    Last attempt to quit: 03/05/1974    Years since quitting: 44.4  . Smokeless tobacco: Never Used  Substance and Sexual Activity  . Alcohol use: No    Alcohol/week: 0.0 standard drinks  . Drug use: No  . Sexual activity: Not Currently  Lifestyle  . Physical activity:    Days per week: Not on file    Minutes per session: Not on file  . Stress: Not on file  Relationships  . Social connections:  Talks on phone: Not on file    Gets together: Not on file    Attends religious service: Not on file    Active member of club or organization: Not on file    Attends meetings of clubs or organizations: Not on file    Relationship status: Not on file  . Intimate partner violence:    Fear of current or ex partner: Not on file    Emotionally abused: Not on file    Physically abused: Not on file    Forced sexual activity: Not on file  Other Topics Concern  . Not on file  Social History Narrative  . Not on file    Allergies  Allergen Reactions  . Bee Venom Swelling  . Lexiscan [Regadenoson] Other (See Comments)    Seizure like-activity after lexiscan given.  . Ace Inhibitors Cough    Current Facility-Administered Medications  Medication Dose Route Frequency Provider Last Rate Last Dose  . 0.9 %  sodium chloride infusion  250 mL Intravenous PRN Opyd, Ilene Qua, MD       . 0.9 %  sodium chloride infusion   Intravenous PRN Opyd, Ilene Qua, MD   Stopped at 07/12/18 0318  . acetaminophen (TYLENOL) tablet 650 mg  650 mg Oral Q6H PRN Opyd, Ilene Qua, MD   650 mg at 07/17/18 0805   Or  . acetaminophen (TYLENOL) suppository 650 mg  650 mg Rectal Q6H PRN Opyd, Ilene Qua, MD      . amLODipine (NORVASC) tablet 10 mg  10 mg Oral QPC supper Penninger, [place], [place]   10 mg at 07/17/18 1803  . aspirin EC tablet 81 mg  81 mg Oral Daily Opyd, Ilene Qua, MD   81 mg at 07/18/18 0916  . atropine 1 % ophthalmic solution 1 drop  1 drop Left Eye Cloretta Ned, MD   1 drop at 07/17/18 2053  . calcitRIOL (ROCALTROL) capsule 0.25 mcg  0.25 mcg Oral Daily Opyd, Ilene Qua, MD   0.25 mcg at 07/18/18 0916  . carvedilol (COREG) tablet 25 mg  25 mg Oral BID Vianne Bulls, MD   25 mg at 07/18/18 0914  . Chlorhexidine Gluconate Cloth 2 % PADS 6 each  6 each Topical Q0600 Alric Seton, PA-C   6 each at 07/18/18 1210  . clopidogrel (PLAVIX) tablet 75 mg  75 mg Oral Daily Opyd, Ilene Qua, MD   75 mg at 07/18/18 0914  . doxycycline (VIBRA-TABS) tablet 100 mg  100 mg Oral Q12H Kc, Ramesh, MD   100 mg at 07/18/18 0917  . ezetimibe (ZETIA) tablet 10 mg  10 mg Oral QPM Opyd, Ilene Qua, MD   10 mg at 07/17/18 1803  . feeding supplement (NEPRO CARB STEADY) liquid 237 mL  237 mL Oral BID BM Alric Seton, PA-C   237 mL at 07/18/18 0952  . finasteride (PROSCAR) tablet 5 mg  5 mg Oral Daily Opyd, Ilene Qua, MD   5 mg at 07/18/18 0913  . folic acid (FOLVITE) tablet 2 mg  2 mg Oral Daily Opyd, Ilene Qua, MD   2 mg at 07/18/18 0916  . haloperidol lactate (HALDOL) injection 1 mg  1 mg Intravenous Q6H PRN Opyd, Ilene Qua, MD      . hydrALAZINE (APRESOLINE) injection 10 mg  10 mg Intravenous Q4H PRN Opyd, Ilene Qua, MD   10 mg at 07/12/18 1852  . HYDROcodone-acetaminophen (NORCO/VICODIN) 5-325 MG per tablet 1 tablet  1 tablet Oral Q4H PRN  Vianne Bulls, MD   1 tablet at 07/16/18 2221  . insulin  aspart (novoLOG) injection 0-5 Units  0-5 Units Subcutaneous QHS Opyd, Timothy S, MD      . insulin aspart (novoLOG) injection 0-9 Units  0-9 Units Subcutaneous TID WC Opyd, Ilene Qua, MD   2 Units at 07/18/18 1145  . labetalol (NORMODYNE,TRANDATE) injection 10 mg  10 mg Intravenous Q6H PRN Kc, Ramesh, MD      . ondansetron (ZOFRAN) tablet 4 mg  4 mg Oral Q6H PRN Opyd, Ilene Qua, MD       Or  . ondansetron (ZOFRAN) injection 4 mg  4 mg Intravenous Q6H PRN Opyd, Ilene Qua, MD      . polyethylene glycol (MIRALAX / GLYCOLAX) packet 17 g  17 g Oral BID Opyd, Ilene Qua, MD   17 g at 07/18/18 0913  . prednisoLONE acetate (PRED FORTE) 1 % ophthalmic suspension 1 drop  1 drop Left Eye Cloretta Ned, MD   1 drop at 07/17/18 2056  . QUEtiapine (SEROQUEL) tablet 12.5 mg  12.5 mg Oral QHS Kc, Ramesh, MD   12.5 mg at 07/17/18 2101  . senna-docusate (Senokot-S) tablet 2 tablet  2 tablet Oral Daily Opyd, Ilene Qua, MD   2 tablet at 07/18/18 0920  . sodium chloride flush (NS) 0.9 % injection 3 mL  3 mL Intravenous Q12H Opyd, Ilene Qua, MD   3 mL at 07/17/18 2057  . sodium chloride flush (NS) 0.9 % injection 3 mL  3 mL Intravenous PRN Opyd, Ilene Qua, MD        REVIEW OF SYSTEMS:  [X]  denotes positive finding, [ ]  denotes negative finding Cardiac  Comments:  Chest pain or chest pressure:    Shortness of breath upon exertion:    Short of breath when lying flat:    Irregular heart rhythm:        Vascular    Pain in calf, thigh, or hip brought on by ambulation:    Pain in feet at night that wakes you up from your sleep:     Blood clot in your veins:    Leg swelling:  x       Pulmonary    Oxygen at home:    Productive cough:     Wheezing:         Neurologic    Sudden weakness in arms or legs:     Sudden numbness in arms or legs:     Sudden onset of difficulty speaking or slurred speech:    Temporary loss of vision in one eye:     Problems with dizziness:         Gastrointestinal    Blood in  stool:     Vomited blood:         Genitourinary    Burning when urinating:     Blood in urine:        Psychiatric    Major depression:         Hematologic    Bleeding problems:    Problems with blood clotting too easily:        Skin    Rashes or ulcers: x Right leg      Constitutional    Fever or chills:     PHYSICAL EXAM:   Vitals:   07/17/18 1645 07/17/18 2045 07/18/18 0421 07/18/18 0845  BP: (!) 149/71 (!) 170/92 (!) 139/101 (!) 149/85  Pulse: 81 75 85 89  Resp: (!)  22 20 18 18   Temp: 98 F (36.7 C) 98.3 F (36.8 C) (!) 97.4 F (36.3 C)   TempSrc: Oral Oral Oral   SpO2: 95% 98% 96% 98%  Weight:   102 kg   Height:       GENERAL: The patient is a well-nourished male, in no acute distress. The vital signs are documented above. HEENT: His left eye is opacified. CARDIAC: There is a regular rate and rhythm.  VASCULAR: I do not detect carotid bruits however it is somewhat difficult to assess because he does not stop breathing when asked. On the right side, which is the side of concern, he has a palpable femoral and popliteal pulse.  He has a monophasic dorsalis pedis signal with a biphasic posterior tibial signal.  The toe pressure on the right is 96 mmHg. On the right side he has a palpable femoral pulse.  I cannot palpate popliteal or pedal pulses.  He has monophasic Doppler signals in the dorsalis pedis and posterior tibial positions. He has bilateral lower extremity swelling and hyperpigmentation bilaterally consistent with chronic venous insufficiency. PULMONARY: There is good air exchange bilaterally without wheezing or rales. ABDOMEN: Soft and non-tender with normal pitched bowel sounds.  MUSCULOSKELETAL: There are no major deformities or cyanosis. NEUROLOGIC: No focal weakness or paresthesias are detected. SKIN:  PSYCHIATRIC: The appears confused.  DATA:    ARTERIAL DOPPLER STUDY: I have reviewed the arterial Doppler study that was done on 07/14/2018.  On the  right side there is a monophasic dorsalis pedis and posterior tibial signal with a toe pressure of 96 mmHg.  An ABI was not obtained.  On the left side there was a monophasic dorsalis pedis and posterior tibial signal.  The ABI was 100% but not reliable as the patient has calcific arteries.  Toe pressure on the left was 78 mmHg.   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  He has a palpable femoral and popliteal pulse.  He has a biphasic posterior tibial signal on the right with the Doppler and a toe pressure of 96 mmHg which would suggest adequate circulation for healing.  This reason I would not recommend any further arterial work-up at this time.  On the left side he has evidence of infrainguinal arterial occlusive disease with monophasic signals in the foot and a toe pressure of 78 however he has no wounds on the left side.  He does have chronic venous insufficiency and the treatment for this typically is elevation and compression but given his mild peripheral vascular disease I would not be overly aggressive with compression therapy.    CHRONIC VENOUS INSUFFICIENCY: This patient has CEAP C-4 venous disease with hyperpigmentation bilaterally.  He has moderate leg swelling bilaterally.  I would recommend elevating the legs as tolerated.  Although he has some peripheral vascular disease it does not appear to be severe and as long as he does not develop rest pain he should be able to elevate his legs.  I would also treat the right leg wound with some mild compression but not too aggressive with compression given his tibial artery occlusive disease.  I can arrange follow-up in the office in 6 to 8 weeks.  However during this admission I do not see any reason to proceed with arteriography or further arterial work-up.  Vascular surgery will be available as needed.   Deitra Mayo [facility] [phone]

## 2018-07-18 NOTE — Discharge Summary (Signed)
Physician Discharge Summary  Gregory Hood NKN:[id number] DOB: 02/05/1951 DOA: 07/11/2018  PCP: Jilda Panda, MD  Admit date: 07/11/2018 Discharge date: 07/18/2018  Admitted From:Home Disposition:SNF  Recommendations for Outpatient Follow-up  1. Follow up with PCP in 1-2 weeks 2. Please obtain BMP/CBC in one week 3. Please follow up on the following pending results  Home Health: No Equipment/Devices: None  Discharge Condition: stable CODE STATUS: Full code Diet recommendation: Renal, low carbohydrate diet  Brief/Interim Summary: 68 year old male with history of ESRD on HD MWF, chronic diastolic CHF, hypertension, history of CVA, chronic lower extremity wounds, Alzheimer dementia, legal blindness lives at home with wife presented to [facility] ER for evaluation of confusion and fevers. As per wife patient has been confused for roughly 2 days, went to dialysis on 1/13, reportedly completed the session but was noted to have fever of 102 with some agitation and possible confusion. Patient was having mild drainage from his wound on the right shin. As per the granddaughter he has been having memory issues for past 2 to 3 years and for last 1 week has been worsening, making random calls over phone and not being himself.  In the ER, [facility] EKG with sinus rhythm, chest x-ray pulmonary vascular congestion but no edema or consolidation, CT head negative for acute abnormality, routine lab work CBC fairly stable, influenza PCR negative troponin was borderline positive, UA notes history of infection.1 L normal saline, Zyvox and Haldol in the ED.He was transferred to [facility]. He was admitted, managed with IV antibiotics for this wound.  It is nicely improved,no more erythema redness present on his right lower leg, wound is healing.His mental status seems overall stable and improved. Seen by PT/OT and has recommended home health with 24-hour supervision.Family is unable to provide 24-hour  supervision.Awaiting SNF,otherwise is medically stable.  Assessment & Plan:  Cellulitis of right leg with right shin ulcer w some draiange : cellulitis is improved, ulcer is healing. Cont local wound care and po doxycycline.     Abnormal vascular study in ABI with peripheral vascular disease:"Right:the right toe-brachial index is abnormal. Left: Resting left ankle-brachial index is within normal range. No evidence of significant left lower extremity arterial disease. The left toe-brachial index is abnormal." I have consulted vascular surgery Dr. Scot Dock who has, and the patient does not recommend any inpatient work-up and will plan to follow-up as outpatient. RN able to obtain good pulses in doppler, he had very thick and hardened skin in LE.   Acute encephalopathy, likely toxic metabolic in the setting of dementia. Family notes that he has memory issues and has been acting unusual for last 1 week.Repeat CT headisunremarkable for acute finding. Has PM and MRI may not be feasible.  His mental status overall improved and appears to be  at baseline-alert, awake, oriented to place, self, people and is communicative.He does have baseline dementia/memory issues.W/u with Ammonia, tsh, B12 ,RPR, hiv are all normal/negative. Continue supportive care with home meds and family at home.  CT head x2 without any acute finding. Cont low dose Seroquel bedtime.  Fall,? Left forehead injury.No obvious injury on the head and CT head on repeat was unremarkable.  Educated on fall precaution and safety to grand-kid and family.  PT OT appreciated.  Looking into skilled nursing facility.  Hypertensive urgency:BP controlled.Stopped hydralazine by nephro.  Continue on Coreg, amlodipine.    DM, type 2 with renal complications: Controlled, cont current insulin insulin.   Chronic diastolic heart failure:Compensated, continue on Lasix  End stage renal disease on HD MWF: s/p HD 1/20  History of stroke: Overall  nonfocal and stable. He is on aspirin, Plavix and Zetia. CT HEADx2 unremarkable for acute finding  DVT prophylaxis:SQ HEPARIN  Code Status:FULL Family Communication:Wife was communicated Disposition Plan:Awaiting for skilled nursing facility placement. Family unable to provide 24 x7 care at home.  Consultants: Nephrology  Procedures:  Hemodialysis 07/17/2018  VS [place] ABI:   Right:the right toe-brachial index is abnormal. Left: Resting left ankle-brachial index is within normal range. No evidence of significant left lower extremity arterial disease. The left toe-brachial index is abnormal.  Antimicrobials: Vancomycin 07/11/2018 >>07/15/17. Doxycycline 1/18 >  Discharge Diagnoses:  Principal Problem:   Cellulitis of right leg Active Problems:   DM (diabetes mellitus), type 2 with renal complications (HCC)   Chronic diastolic heart failure (HCC)   End stage renal disease (HCC)   OSA (obstructive sleep apnea)   History of completed stroke   Acute encephalopathy   Hypertensive urgency  Discharge Instructions Discharge Instructions    Call MD for:  persistant nausea and vomiting   Complete by:  As directed    Call MD for:  severe uncontrolled pain   Complete by:  As directed    Call MD for:  temperature >100.4   Complete by:  As directed    Diet - low sodium heart healthy   Complete by:  As directed    Diabetic and renal diet   Discharge instructions   Complete by:  As directed    Please call call MD or return to ER for similar recurring problem, nausea/vomiting, uncontrolled pain, abdominal pain chest pain, shortness of breath, fever. Please follow-up your doctor as instructed in few days. FOLLOW UP WITH vascular doctor Dr. Scot Dock in 6 to 8 weeks   Increase activity slowly   Complete by:  As directed     : Allergies as of 07/18/2018      Reactions   Bee Venom Swelling   Lexiscan [regadenoson] Other (See Comments)   Seizure like-activity after lexiscan given.    Ace Inhibitors Cough      Medication List    STOP taking these medications   furosemide 80 MG tablet Commonly known as:  LASIX   hydrALAZINE 25 MG tablet Commonly known as:  APRESOLINE     TAKE these medications   acetaminophen 500 MG tablet Commonly known as:  TYLENOL Take 500 mg by mouth every 6 (six) hours as needed for mild pain or moderate pain.   amLODipine 10 MG tablet Commonly known as:  NORVASC Take 1 tablet (10 mg total) by mouth daily.   aspirin 81 MG tablet Take 1 tablet (81 mg total) by mouth daily.   atropine 1 % ophthalmic solution Place 1 drop into the left eye daily.   calcitRIOL 0.25 MCG capsule Commonly known as:  ROCALTROL Take 1 capsule (0.25 mcg total) by mouth daily.   carvedilol 25 MG tablet Commonly known as:  COREG Take 25 mg by mouth 2 (two) times daily.   Cholecalciferol 50 MCG (2000 UT) Tabs Take 1 tablet (2,000 Units total) by mouth daily.   clopidogrel 75 MG tablet Commonly known as:  PLAVIX Take 75 mg by mouth daily.   doxycycline 100 MG tablet Commonly known as:  VIBRA-TABS Take 1 tablet (100 mg total) by mouth every 12 (twelve) hours for 5 days.   finasteride 5 MG tablet Commonly known as:  PROSCAR Take 5 mg by mouth daily.   folic  acid 1 MG tablet Commonly known as:  FOLVITE Take 2 tablets (2 mg total) by mouth daily.   ONETOUCH VERIO test strip Generic drug:  glucose blood   polyethylene glycol packet Commonly known as:  MIRALAX / GLYCOLAX Take 17 g by mouth 2 (two) times daily. What changed:    when to take this  reasons to take this   prednisoLONE acetate 1 % ophthalmic suspension Commonly known as:  PRED FORTE Place 1 drop into the left eye at bedtime.   QUEtiapine 25 MG tablet Commonly known as:  SEROQUEL Take 0.5 tablets (12.5 mg total) by mouth at bedtime.   Red Yeast Rice 600 MG Caps Take 1 capsule by mouth daily.   senna-docusate 8.6-50 MG tablet Commonly known as:  Senokot-S Take 2 tablets by  mouth daily. Start taking on:  July 19, 2018 What changed:    when to take this  reasons to take this      [place] Follow up.   Specialty:  Home Health Services Why:  PT, OT, RN, Aide Contact information: [address] [place] [address] [phone]          Allergies  Allergen Reactions  . Bee Venom Swelling  . Lexiscan [Regadenoson] Other (See Comments)    Seizure like-activity after lexiscan given.  Humberto Leep Inhibitors Cough    Consultations:  Nephrology   Vascular surgery    Procedures/Studies: Ct Head Wo Contrast  Result Date: 07/12/2018 CLINICAL DATA:  68 year old who presented this admission with fever and confusion. Patient fell yesterday and has ataxia. EXAM: CT HEAD WITHOUT CONTRAST TECHNIQUE: Contiguous axial images were obtained from the base of the skull through the vertex without intravenous contrast. COMPARISON:  07/10/2018, 01/21/2018 and earlier. FINDINGS: Patient motion blurred images of the skull base but the study does appear diagnostic. Brain: Moderate cortical, deep and cerebellar atrophy, unchanged. Severe changes of small vessel disease of the white matter diffusely, unchanged. Old lacunar strokes involving the basal ganglia bilaterally, unchanged. Physiologic calcifications in the basal ganglia, unchanged. No mass lesion. No midline shift. No acute hemorrhage or hematoma. No extra-axial fluid collections. No evidence of acute infarction. No new/acute abnormalities involving the brain since the examination 2 days ago. Vascular: Severe BILATERAL carotid siphon and LEFT vertebral artery atherosclerosis. No hyperdense vessel. Skull: No skull fracture or other focal osseous abnormality involving the skull. Sinuses/Orbits: Visualized paranasal sinuses, BILATERAL mastoid air cells and BILATERAL middle ear cavities well-aerated. LEFT phthisis bulbi. Normal-appearing RIGHT orbit and globe. Other: None.  IMPRESSION: 1. No acute intracranial abnormality. 2. Stable moderate generalized atrophy and severe chronic microvascular ischemic changes of the white matter. Electronically Signed   By: Evangeline Dakin M.D.   On: 07/12/2018 11:53   Vas [place] Burnard Bunting With/wo Tbi  Result Date: 07/16/2018 LOWER EXTREMITY DOPPLER STUDY Indications: Ulceration. High Risk Factors: Diabetes.  Limitations: IV and AVF graft on site; ulcer patches on ankle, patient cannot              tolerate the pressure cuff's compression. Comparison Study: No comparison study available Performing Technologist: Rudell Cobb RDMS RVT  Examination Guidelines: A complete evaluation includes at minimum, Doppler waveform signals and systolic blood pressure reading at the level of bilateral brachial, anterior tibial, and posterior tibial arteries, when vessel segments are accessible. Bilateral testing is considered an integral part of a complete examination. Photoelectric Plethysmograph (PPG) waveforms and toe systolic pressure readings are included as required and additional  duplex testing as needed. Limited examinations for reoccurring indications may be performed as noted.  ABI Findings: +---------+------------------+-----+----------+--------------------------------+ Right    Rt Pressure (mmHg)IndexWaveform  Comment                          +---------+------------------+-----+----------+--------------------------------+ Brachial 141                    triphasic                                  +---------+------------------+-----+----------+--------------------------------+ PTA                             monophasicpressure unable to obtain due to                                           patient has ulcer and patches on                                           ankle                            +---------+------------------+-----+----------+--------------------------------+ DP                              monophasicpressure  unable to obtain due to                                           patient has ulcer and patches on                                           ankle                            +---------+------------------+-----+----------+--------------------------------+ Great Toe96                0.68                                            +---------+------------------+-----+----------+--------------------------------+ +---------+------------------+-----+-------------------+-----------------------+ Left     Lt Pressure (mmHg)IndexWaveform           Comment                 +---------+------------------+-----+-------------------+-----------------------+ Brachial                        triphasic          AVF graft on site with  bandages                +---------+------------------+-----+-------------------+-----------------------+ PTA      144               1.02 monophasic                                 +---------+------------------+-----+-------------------+-----------------------+ DP       44                0.31 dampened monophasic                        +---------+------------------+-----+-------------------+-----------------------+ Great Toe78                0.55                                            +---------+------------------+-----+-------------------+-----------------------+ +-------+-----------+-----------+------------+------------+ ABI/TBIToday's ABIToday's TBIPrevious ABIPrevious TBI +-------+-----------+-----------+------------+------------+ Right             0.68                                +-------+-----------+-----------+------------+------------+ Left   1.02       0.55                                +-------+-----------+-----------+------------+------------+  Summary: Right: The right toe-brachial index is abnormal. Left: Resting left ankle-brachial index is within normal range. No  evidence of significant left lower extremity arterial disease. The left toe-brachial index is abnormal.  *See table(s) above for measurements and observations.  Electronically signed by Harold Barban MD on 07/16/2018 at 5:04:14 PM.    Final      Subjective: Still comfortably, blind in the left eye, alert awake communicative knows the place people. Baseline confusion/dementia  Discharge Exam: Vitals:   07/18/18 0845 07/18/18 1030  BP: (!) 149/85 134/72  Pulse: 89 71  Resp: 18 16  Temp:  98.3 F (36.8 C)  SpO2: 98% 98%   Vitals:   07/17/18 2045 07/18/18 0421 07/18/18 0845 07/18/18 1030  BP: (!) 170/92 (!) 139/101 (!) 149/85 134/72  Pulse: 75 85 89 71  Resp: 20 18 18 16   Temp: 98.3 F (36.8 C) (!) 97.4 F (36.3 C)  98.3 F (36.8 C)  TempSrc: Oral Oral  Oral  SpO2: 98% 96% 98% 98%  Weight:  102 kg    Height:        General: Pt is alert, awake, oriented to self, place, legally blind not in acute distress Cardiovascular: RRR, S1/S2 +, no rubs, no gallops Respiratory: CTA bilaterally, no wheezing, no rhonchi Abdominal: Soft, NT, ND, bowel sounds + Extremities: Chronic hyperpigmentation and skin change in his lower extremities with hardened and thickened skin, right anterior shin wound is nicely healing.    The results of significant diagnostics from this hospitalization (including imaging, microbiology, ancillary and laboratory) are listed below for reference.     Microbiology: Recent Results (from the past 240 hour(s))  MRSA PCR Screening     Status: None   Collection Time: 07/11/18  9:33 PM  Result Value Ref Range Status   MRSA by PCR NEGATIVE NEGATIVE Final  Comment:        The GeneXpert MRSA Assay (FDA approved for NASAL specimens only), is one component of a comprehensive MRSA colonization surveillance program. It is not intended to diagnose MRSA infection nor to guide or monitor treatment for MRSA infections. Performed at [facility], [place] [address] [place], [place] [address]      Labs: BNP (last 3 results) Recent Labs    03/22/18 0206  BNP 4,081.4*   Basic Metabolic Panel: Recent Labs  Lab 07/12/18 0504 07/14/18 0720 07/17/18 0807  NA 136 135 135  K 4.0 3.5 3.9  CL 93* 96* 97*  CO2 28 25 25   GLUCOSE 131* 117* 96  BUN 28* 29* 38*  CREATININE 5.96* 5.70* 6.34*  CALCIUM 8.6* 8.3* 8.2*  PHOS 5.1* 4.8* 5.1*   Liver Function Tests: Recent Labs  Lab 07/14/18 0720 07/17/18 0807  ALBUMIN 3.1* 2.9*   No results for input(s): LIPASE, AMYLASE in the last 168 hours. Recent Labs  Lab 07/11/18 2330  AMMONIA 33   CBC: Recent Labs  Lab 07/11/18 2330 07/12/18 0504 07/14/18 0720 07/17/18 0808  WBC  --  7.0 6.3 6.9  NEUTROABS  --  5.7  --   --   HGB  --  12.0* 11.5* 12.2*  HCT 38.2 38.0* 36.7* 38.1*  MCV  --  85.8 85.2 86.6  PLT  --  140* 168 194   Cardiac Enzymes: No results for input(s): CKTOTAL, CKMB, CKMBINDEX, TROPONINI in the last 168 hours. BNP: Invalid input(s): POCBNP CBG: Recent Labs  Lab 07/16/18 2052 07/17/18 1603 07/17/18 2100 07/18/18 0727 07/18/18 1114  GLUCAP 143* 147* 122* 109* 179*   D-Dimer No results for input(s): DDIMER in the last 72 hours. Hgb A1c No results for input(s): HGBA1C in the last 72 hours. Lipid Profile No results for input(s): CHOL, HDL, LDLCALC, TRIG, CHOLHDL, LDLDIRECT in the last 72 hours. Thyroid function studies No results for input(s): TSH, T4TOTAL, T3FREE, THYROIDAB in the last 72 hours.  Invalid input(s): FREET3 Anemia work up No results for input(s): VITAMINB12, FOLATE, FERRITIN, TIBC, IRON, RETICCTPCT in the last 72 hours. Urinalysis    Component Value Date/Time   COLORURINE YELLOW 09/24/2014 0216   APPEARANCEUR CLOUDY (A) 09/24/2014 0216   LABSPEC 1.018 09/24/2014 0216   PHURINE 5.0 09/24/2014 0216   GLUCOSEU NEGATIVE 09/24/2014 0216   HGBUR NEGATIVE 09/24/2014 0216   BILIRUBINUR NEGATIVE 09/24/2014 0216   KETONESUR NEGATIVE 09/24/2014 0216    PROTEINUR >300 (A) 09/24/2014 0216   UROBILINOGEN 0.2 09/24/2014 0216   NITRITE NEGATIVE 09/24/2014 0216   LEUKOCYTESUR NEGATIVE 09/24/2014 0216   Sepsis Labs Invalid input(s): PROCALCITONIN,  WBC,  LACTICIDVEN Microbiology Recent Results (from the past 240 hour(s))  MRSA PCR Screening     Status: None   Collection Time: 07/11/18  9:33 PM  Result Value Ref Range Status   MRSA by PCR NEGATIVE NEGATIVE Final    Comment:        The GeneXpert MRSA Assay (FDA approved for NASAL specimens only), is one component of a comprehensive MRSA colonization surveillance program. It is not intended to diagnose MRSA infection nor to guide or monitor treatment for MRSA infections. Performed at [facility], [place] [address] [place], [place] [address]      Time coordinating discharge: 35 minutes  SIGNED:   Antonieta Pert, MD  [facility] 07/18/2018, 3:45 PM  If 7PM-7AM, please contact night-coverage [email or web address]

## 2018-07-18 NOTE — Clinical Social Work Note (Signed)
CSW checked in with HD staff person Caryl Pina today regarding the status of patient's dialysis being set-up in [place]. Per Caryl Pina, this is still being worked on with the dialysis center and as of this morning, the dialysis MD had not accepted patient. CSW followed up with Caryl Pina later today and she was still waiting to hear from the dialysis contact. Patient's wife and Josh with [facility] SN[facility] updated. CSW will continue to be in contact with dialysis staff, provide SW intervention services as needed and assist with patient's discharge once HD set-up in [place], [place].  Jorden Mahl Givens, MSW, LCSW Licensed Clinical Social Worker [place] [phone]

## 2018-07-19 LAB — RENAL FUNCTION PANEL
Albumin: 3.1 g/dL — ABNORMAL LOW (ref 3.5–5.0)
Anion gap: 13 (ref 5–15)
BUN: 39 mg/dL — ABNORMAL HIGH (ref 8–23)
CO2: 24 mmol/L (ref 22–32)
Calcium: 8.4 mg/dL — ABNORMAL LOW (ref 8.9–10.3)
Chloride: 99 mmol/L (ref 98–111)
Creatinine, Ser: 5.48 mg/dL — ABNORMAL HIGH (ref 0.61–1.24)
GFR calc Af Amer: 12 mL/min — ABNORMAL LOW (ref >60)
GFR calc non Af Amer: 10 mL/min — ABNORMAL LOW (ref >60)
Glucose, Bld: 70 mg/dL (ref 70–99)
Phosphorus: 4.7 mg/dL — ABNORMAL HIGH (ref 2.5–4.6)
Potassium: 4.1 mmol/L (ref 3.5–5.1)
Sodium: 136 mmol/L (ref 135–145)

## 2018-07-19 LAB — CBC
HCT: 37.4 % — ABNORMAL LOW (ref 39.0–52.0)
Hemoglobin: 11.9 g/dL — ABNORMAL LOW (ref 13.0–17.0)
MCH: 27.5 pg (ref 26.0–34.0)
MCHC: 31.8 g/dL (ref 30.0–36.0)
MCV: 86.6 fL (ref 80.0–100.0)
Platelets: 194 10*3/uL (ref 150–400)
RBC: 4.32 MIL/uL (ref 4.22–5.81)
RDW: 18 % — ABNORMAL HIGH (ref 11.5–15.5)
WBC: 6.8 10*3/uL (ref 4.0–10.5)
nRBC: 0 % (ref 0.0–0.2)

## 2018-07-19 LAB — GLUCOSE, CAPILLARY
Glucose-Capillary: 63 mg/dL — ABNORMAL LOW (ref 70–99)
Glucose-Capillary: 102 mg/dL — ABNORMAL HIGH (ref 70–99)
Glucose-Capillary: 159 mg/dL — ABNORMAL HIGH (ref 70–99)
Glucose-Capillary: 142 mg/dL — ABNORMAL HIGH (ref 70–99)

## 2018-07-19 MED ORDER — HEPARIN SODIUM (PORCINE) 1000 UNIT/ML DIALYSIS: 1000.0000 [IU] | INTRAMUSCULAR | Status: DC | PRN

## 2018-07-19 MED ORDER — LIDOCAINE HCL (PF) 1 % IJ SOLN: 5.0000 mL | INTRAMUSCULAR | Status: DC | PRN

## 2018-07-19 MED ORDER — SODIUM CHLORIDE 0.9 % IV SOLN: 100.0000 mL | INTRAVENOUS | Status: DC | PRN

## 2018-07-19 MED ORDER — HEPARIN SODIUM (PORCINE) 1000 UNIT/ML DIALYSIS: 20.0000 [IU]/kg | INTRAMUSCULAR | Status: DC | PRN

## 2018-07-19 MED ORDER — ALTEPLASE 2 MG IJ SOLR: 2.0000 mg | Freq: Once | INTRAMUSCULAR | Status: DC | PRN

## 2018-07-19 MED ORDER — LIDOCAINE-PRILOCAINE 2.5-2.5 % EX CREA: 1.0000 "application " | TOPICAL_CREAM | CUTANEOUS | Status: DC | PRN

## 2018-07-19 MED ORDER — PENTAFLUOROPROP-TETRAFLUOROETH EX AERO: 1.0000 "application " | INHALATION_SPRAY | CUTANEOUS | Status: DC | PRN

## 2018-07-19 NOTE — Progress Notes (Addendum)
Spoke with patient's wife via phone today regarding requested fistulagram with possible intervention due to prolonged bleeding on HD. She reports that she is upset that this was not discussed with her previously. She does NOT consent to fistulagram at this time.   Patient's wife reports she is very concerned that the patient has had a stroke due to trouble findings words, increased confusion and recent fall. She asks that we work him up for a stroke prior to any procedures - explained that we in IR do not follow patient's regularly and haven just been asked to do this procedure so it would not be appropriate on our end to pursue stroke work up. She has requested that the primary service meet with her to discuss this issue and further plans for work up. She is also concerned that he was placed back on ASA and Plavix which she states she requested not be done - she is concerned about "bleeding out from blood clots" due to being on this medication.  During discussion of fistulagram she expresses concerns regarding contrast dye and kidney function, damage to the fistula and possible HD catheter placement if damage were to occur while attempting to intervene so that patient may continue to receive dialysis. She is very concerned regarding the possibility of damage to the fistula resulting in HD catheter placement - she is requesting that an ultrasound of the fistula be done prior to any IR intervention. This has been discussed with nephrology PA who is aware and is ok with [place] being performed prior to fistulagram.   We will defer further workup (including [place] of AVF if they feel this is warranted) to primary team at this time based on patient's wife concerns above. We will continue to follow chart and if decision to move forward with fistulagram is made we will discuss again with wife for consent.  Please call IR with questions or concerns.  Candiss Norse, PA-C Pager# [phone]

## 2018-07-19 NOTE — Progress Notes (Signed)
PROGRESS NOTE    Gregory Hood  YTK:[id number] DOB: 06-Oct-1950 DOA: 07/11/2018 PCP: Jilda Panda, MD   Hospital course: 68 year old male with history of ESRD on HD MWF, chronic diastolic CHF, hypertension, history of CVA, chronic lower extremity wounds, Alzheimer dementia, legal blindness lives at home with wife presented to [facility] ER for evaluation of confusion and fevers.  As per wife patient has been confused for roughly 2 days, went to dialysis on 1/13, reportedly completed the session but was noted to have fever of 102 with some agitation and possible confusion.  Patient having mild drainage from his wound on the right shin.  At home patient does relate with assistance. As per the granddaughter he has been having memory issues for past 2 to 3 years and for last 1 week has been worsening, making random calls over phone and not being himself.  In the ER, [facility] EKG with sinus rhythm, chest x-ray pulmonary vascular congestion but no edema or consolidation, CT head negative for acute abnormality, routine lab work CBC fairly stable, influenza PCR negative troponin was borderline positive, UA notes history of infection.1 L normal saline, Zyvox and Haldol in the ED.  He was transferred to [facility]. She was admitted, managed with IV antibiotics for this wound.  It is nicely improving, no more erythema redness present on his right lower leg.  His mental status seems overall stable and improved.Seen by PT OT and has recommended home health and will set up home health PT OT RN and aide on discharge.  07/19/2018: Patient seen during hemodialysis.  Prior documentations noted.  Patient is awaiting disposition.  Nephrology input is highly appreciated.  Assessment & Plan:   Cellulitis of right leg with right shin ulcer w some draiange : Overall nicely improved right shin wound, continue local wound care, continue oral doxycycline. He has very good dopplerable pulses but ABI difficult to be obtained due  to his leg wound, will need outpatient vascular surgery referral.   07/19/2018: Stable.  Acute encephalopathy, likely toxic metabolic in the setting of dementia. Family notes that he has memory issues and has been acting unusual for last 1 week.Repeat CT head is unremarkable for acute finding.  Has PM and MRI may not be feasible.  His mental status overall improved and appears to be  at baseline-alert, awake, oriented to place, self, people and is communicative.  He does have baseline dementia/memory issues. W/u with Ammonia, tsh, B12 ,RPR, hiv are all normal/negative. Continue supportive care with home meds and family at home.  CT head x2 without any acute finding. Cont low dose Seroquel bedtime. 07/19/2018: Stable as per prior documentations.  Continue to assess.  Fall,?  Left forehead injury.  No obvious injury on the head and CT head on repeat was unremarkable.  Educated on fall precaution and safety.  PT OT appreciated.  Look into skilled nursing facility.  Hypertensive urgency : BP stable.  Stopped hydralazine by nephro.  Continue on Coreg, amlodipine.    DM (diabetes mellitus), type 2 with renal complications: Controlled, cont current insulin insulin.   Chronic diastolic heart failure : Compensated, continue on Lasix  End stage renal disease on HD MWF: Due for dialysis on Monday.   History of stroke: Overall nonfocal and stable.  He is on aspirin, Plavix and Zetia.  CT HEAD x2 unremarkable for acute finding  DVT prophylaxis: SQ HEPARIN  Code Status: FULL Family Communication: Wife was communicated Disposition Plan: Awaiting for skilled nursing facility placement.  as  family unable to provide 24 x7 care at home  Consultants: Nephrology  Procedures: Hemodialysis  Antimicrobials: Vancomycin 07/11/2018 >>07/15/17  Subjective: Patient seen during hemodialysis. No new complaints.  Objective: Vitals:   07/19/18 1100 07/19/18 1125 07/19/18 1228 07/19/18 1326  BP: (!) 121/102  100/69 (!) 147/116 138/80  Pulse: 63 62 69 70  Resp:  18 18   Temp:  97.7 F (36.5 C) 97.7 F (36.5 C)   TempSrc:  Oral Oral   SpO2:  97% 92% 98%  Weight:  99.2 kg    Height:        Intake/Output Summary (Last 24 hours) at 07/19/2018 1446 Last data filed at 07/19/2018 1249 Gross per 24 hour  Intake 780 ml  Output 3018 ml  Net -2238 ml   Filed Weights   07/18/18 2030 07/19/18 0645 07/19/18 1125  Weight: 102.1 kg 102.2 kg 99.2 kg   Weight change: -2.9 kg  Body mass index is 32.3 kg/m.  Examination:  General exam: Calm, comfortable, not in acute distress.  Patient is obese. HEENT: Patient is legally blind. Respiratory system: Decreased air entry globally.   Cardiovascular system: S1-S2 heard.  No leg edema.  Right lower extremity wound is dressed.   Gastrointestinal system: Abdomen is obese, soft and nontender.  Organs are difficult to assess.   Nervous System: Awake and alert, but blind.   Extremities: No leg edema.  Right lower extremity wound is dressed.  Medications:  Scheduled Meds: . amLODipine  10 mg Oral QPC supper  . aspirin EC  81 mg Oral Daily  . atropine  1 drop Left Eye QHS  . calcitRIOL  0.25 mcg Oral Daily  . carvedilol  25 mg Oral BID  . Chlorhexidine Gluconate Cloth  6 each Topical Q0600  . clopidogrel  75 mg Oral Daily  . doxycycline  100 mg Oral Q12H  . ezetimibe  10 mg Oral QPM  . feeding supplement (NEPRO CARB STEADY)  237 mL Oral BID BM  . finasteride  5 mg Oral Daily  . folic acid  2 mg Oral Daily  . insulin aspart  0-5 Units Subcutaneous QHS  . insulin aspart  0-9 Units Subcutaneous TID WC  . polyethylene glycol  17 g Oral BID  . prednisoLONE acetate  1 drop Left Eye QHS  . QUEtiapine  12.5 mg Oral QHS  . senna-docusate  2 tablet Oral Daily  . sodium chloride flush  3 mL Intravenous Q12H   Continuous Infusions: . sodium chloride    . sodium chloride Stopped (07/12/18 0318)    Data Reviewed: I have personally reviewed following labs  and imaging studies  CBC: Recent Labs  Lab 07/14/18 0720 07/17/18 0808 07/19/18 0730  WBC 6.3 6.9 6.8  HGB 11.5* 12.2* 11.9*  HCT 36.7* 38.1* 37.4*  MCV 85.2 86.6 86.6  PLT 168 194 462   Basic Metabolic Panel: Recent Labs  Lab 07/14/18 0720 07/17/18 0807 07/19/18 0730  NA 135 135 136  K 3.5 3.9 4.1  CL 96* 97* 99  CO2 25 25 24   GLUCOSE 117* 96 70  BUN 29* 38* 39*  CREATININE 5.70* 6.34* 5.48*  CALCIUM 8.3* 8.2* 8.4*  PHOS 4.8* 5.1* 4.7*   GFR: Estimated Creatinine Clearance: 15.4 mL/min (A) (by C-G formula based on SCr of 5.48 mg/dL (H)). Liver Function Tests: Recent Labs  Lab 07/14/18 0720 07/17/18 0807 07/19/18 0730  ALBUMIN 3.1* 2.9* 3.1*   No results for input(s): LIPASE, AMYLASE in the last 168 hours.  No results for input(s): AMMONIA in the last 168 hours. Coagulation Profile: No results for input(s): INR, PROTIME in the last 168 hours. Cardiac Enzymes: No results for input(s): CKTOTAL, CKMB, CKMBINDEX, TROPONINI in the last 168 hours. BNP (last 3 results) No results for input(s): PROBNP in the last 8760 hours. HbA1C: No results for input(s): HGBA1C in the last 72 hours. CBG: Recent Labs  Lab 07/18/18 1114 07/18/18 1621 07/18/18 2031 07/19/18 1224 07/19/18 1326  GLUCAP 179* 280* 133* 63* 102*   Lipid Profile: No results for input(s): CHOL, HDL, LDLCALC, TRIG, CHOLHDL, LDLDIRECT in the last 72 hours. Thyroid Function Tests: No results for input(s): TSH, T4TOTAL, FREET4, T3FREE, THYROIDAB in the last 72 hours. Anemia Panel: No results for input(s): VITAMINB12, FOLATE, FERRITIN, TIBC, IRON, RETICCTPCT in the last 72 hours. Sepsis Labs: No results for input(s): PROCALCITON, LATICACIDVEN in the last 168 hours.  Recent Results (from the past 240 hour(s))  MRSA PCR Screening     Status: None   Collection Time: 07/11/18  9:33 PM  Result Value Ref Range Status   MRSA by PCR NEGATIVE NEGATIVE Final    Comment:        The GeneXpert MRSA Assay  (FDA approved for NASAL specimens only), is one component of a comprehensive MRSA colonization surveillance program. It is not intended to diagnose MRSA infection nor to guide or monitor treatment for MRSA infections. Performed at [facility], [place] [address] [place], [place] [address]       Radiology Studies: No results found.    LOS: 7 days   Bonnell Public, MD [facility]  07/19/2018, 2:46 PM

## 2018-07-19 NOTE — Progress Notes (Addendum)
[facility] Progress Note   Dialysis Orders: MWF -[place] 4.5hrs, [id number], [id number], EDW 108kg,2K/2.25Ca Access:LU AVF Heparin5000 unit bolus Mircera8mcg q2wks - last 06/07/18 Calcitriol0.44mcg PO qHD  Assessment/Plan: 1. Acute encephalopathy -infection vs. HTN. Labs reassuring. RPR negative.Unsure of baseline mental function, per family worse than usual over the last week.CT head on 1/15 w/no acute findings. Improved but still mild confusion 2. Cellulitis of R leg w/R shin ulcer - now on doxy 3. ESRD - On HD MWF.K 3.9 Monday - requiring added K bath-labs pending;  4. Hypertension/volume -net UF 3 L Monday - averaging that amount most tmt - lowering EDW gently -EDW down to 102 - continue to titrate down. 5. Anemia of CKD - Hgb 12.0> 11.5>12.2 No indication for ESA at this time- labs pending 6. Secondary Hyperparathyroidism - Ca and phos in goal. Continue VDRA. Not on binders 7. Nutrition - Renal diet w/fluid restrictions. Vitamins.alb 2.9 - add nepro 8. Disp - CM/SW working with patient and family - see 1/17 note for details -being CLIPPED to a [facility] unit in [place], [place] plans still pending 9.  Access issue - nursing tells me he had prolonged bleeding of 20 min last tmt - so today running without heparin today BFR reduced to 390 from 450 and VP high at full BFR.  Will see how things go today.  Myriam Jacobson, PA-C [place] 07/19/2018,8:54 AM  LOS: 7 days   Pt seen, examined and agree w A/P as above.  Kelly Splinter MD [employer] pager [phone]   07/19/2018, 12:08 PM   Addendum: prolonged bleeding again today post dialysi without the use of any heparin - last intervention 10/30 at [facility] - had PTA of 62% basilic vein stenosis at that time.  Also noted to have drop in access flow immediately prior to current admission.  Will try to get fistulagram prior to transfer.  Tharon Aquas, PA-C   12:20 pm   Subjective:   No c/o  Objective Vitals:   07/19/18 0655 07/19/18 0700 07/19/18 0730 07/19/18 0800  BP: 127/63 (!) 152/80 121/86 111/86  Pulse: 62 69 79 (!) 56  Resp: 17 18 17 18   Temp: 98.9 F (37.2 C)     TempSrc: Oral     SpO2:  95%  96%  Weight:      Height:       Physical Exam goal 3 L General: on HD supine Heart: RRR Lungs: grossly clear Abdomen: obese, distended nontender + BS Extremities: RLE tr+ edema skin flakey, mild erythema, dressing mid shin, tr LLE edema Dialysis Access:  Left AVF + bruit Qb 390   Additional Objective Labs: Basic Metabolic Panel: Recent Labs  Lab 07/14/18 0720 07/17/18 0807  NA 135 135  K 3.5 3.9  CL 96* 97*  CO2 25 25  GLUCOSE 117* 96  BUN 29* 38*  CREATININE 5.70* 6.34*  CALCIUM 8.3* 8.2*  PHOS 4.8* 5.1*   Liver Function Tests: Recent Labs  Lab 07/14/18 0720 07/17/18 0807  ALBUMIN 3.1* 2.9*   No results for input(s): LIPASE, AMYLASE in the last 168 hours. CBC: Recent Labs  Lab 07/14/18 0720 07/17/18 0808  WBC 6.3 6.9  HGB 11.5* 12.2*  HCT 36.7* 38.1*  MCV 85.2 86.6  PLT 168 194   Blood Culture No results found for: SDES, SPECREQUEST, CULT, REPTSTATUS  Cardiac Enzymes: No results for input(s): CKTOTAL, CKMB, CKMBINDEX, TROPONINI in the last 168 hours. CBG: Recent Labs  Lab 07/17/18 2100  07/18/18 0727 07/18/18 1114 07/18/18 1621 07/18/18 2031  GLUCAP 122* 109* 179* 280* 133*   Iron Studies: No results for input(s): IRON, TIBC, TRANSFERRIN, FERRITIN in the last 72 hours. Lab Results  Component Value Date   INR 0.95 03/08/2010   Studies/Results: No results found. Medications: . sodium chloride    . sodium chloride Stopped (07/12/18 0318)  . sodium chloride    . sodium chloride     . amLODipine  10 mg Oral QPC supper  . aspirin EC  81 mg Oral Daily  . atropine  1 drop Left Eye QHS  . calcitRIOL  0.25 mcg Oral Daily  . carvedilol  25 mg Oral BID  . Chlorhexidine Gluconate Cloth  6  each Topical Q0600  . clopidogrel  75 mg Oral Daily  . doxycycline  100 mg Oral Q12H  . ezetimibe  10 mg Oral QPM  . feeding supplement (NEPRO CARB STEADY)  237 mL Oral BID BM  . finasteride  5 mg Oral Daily  . folic acid  2 mg Oral Daily  . insulin aspart  0-5 Units Subcutaneous QHS  . insulin aspart  0-9 Units Subcutaneous TID WC  . polyethylene glycol  17 g Oral BID  . prednisoLONE acetate  1 drop Left Eye QHS  . QUEtiapine  12.5 mg Oral QHS  . senna-docusate  2 tablet Oral Daily  . sodium chloride flush  3 mL Intravenous Q12H

## 2018-07-19 NOTE — Clinical Social Work Note (Signed)
CSW made contact with Freda Munro in dialysis regarding patient and being set-up for HD in [place], [place]. She will follow-up and get back with CSW. Visit made to patient's room and updated wife and patient regarding still waiting for patient to be set-up in [place] and wife expressed understanding. CSW will f/u with dialysis staff on Thursday.  Kamarii Buren Givens, MSW, LCSW Licensed Clinical Social Worker [place] [phone]

## 2018-07-20 ENCOUNTER — Encounter (HOSPITAL_COMMUNITY): Payer: Self-pay | Admitting: Physician Assistant

## 2018-07-20 ENCOUNTER — Inpatient Hospital Stay (HOSPITAL_COMMUNITY): Payer: Medicare Other

## 2018-07-20 HISTORY — PX: IR AV DIALY SHUNT INTRO NEEDLE/INTRACATH INITIAL W/PTA/IMG LEFT: IMG6103

## 2018-07-20 LAB — CBC
HCT: 36.6 % — ABNORMAL LOW (ref 39.0–52.0)
Hemoglobin: 11.9 g/dL — ABNORMAL LOW (ref 13.0–17.0)
MCH: 27.9 pg (ref 26.0–34.0)
MCHC: 32.5 g/dL (ref 30.0–36.0)
MCV: 85.9 fL (ref 80.0–100.0)
Platelets: 176 10*3/uL (ref 150–400)
RBC: 4.26 MIL/uL (ref 4.22–5.81)
RDW: 18.1 % — ABNORMAL HIGH (ref 11.5–15.5)
WBC: 6.4 10*3/uL (ref 4.0–10.5)
nRBC: 0 % (ref 0.0–0.2)

## 2018-07-20 LAB — GLUCOSE, CAPILLARY
Glucose-Capillary: 123 mg/dL — ABNORMAL HIGH (ref 70–99)
Glucose-Capillary: 120 mg/dL — ABNORMAL HIGH (ref 70–99)
Glucose-Capillary: 115 mg/dL — ABNORMAL HIGH (ref 70–99)
Glucose-Capillary: 167 mg/dL — ABNORMAL HIGH (ref 70–99)

## 2018-07-20 LAB — PROTIME-INR
INR: 1.36
Prothrombin Time: 16.6 seconds — ABNORMAL HIGH (ref 11.4–15.2)

## 2018-07-20 MED ORDER — CHLORHEXIDINE GLUCONATE CLOTH 2 % EX PADS
6.0000 | MEDICATED_PAD | Freq: Every day | CUTANEOUS | Status: DC
  Administered 2018-07-20: 6 via TOPICAL

## 2018-07-20 MED ORDER — LIDOCAINE HCL 1 % IJ SOLN
INTRAMUSCULAR | Status: AC
  Filled 2018-07-20: qty 20

## 2018-07-20 MED ORDER — LIDOCAINE HCL 1 % IJ SOLN
INTRAMUSCULAR | Status: DC | PRN
  Administered 2018-07-20: 5 mL

## 2018-07-20 MED ORDER — IOPAMIDOL (ISOVUE-300) INJECTION 61%
INTRAVENOUS | Status: AC
  Administered 2018-07-20: 25 mL via INTRAVENOUS
  Filled 2018-07-20: qty 100

## 2018-07-20 NOTE — Progress Notes (Signed)
Placed patient on CPAP for the night via auto-mode.  

## 2018-07-20 NOTE — Progress Notes (Signed)
PROGRESS NOTE    Gregory Hood  CVE:[id number] DOB: 1950-10-02 DOA: 07/11/2018 PCP: Jilda Panda, MD   Hospital course: 68 year old male with history of ESRD on HD MWF, chronic diastolic CHF, hypertension, history of CVA, chronic lower extremity wounds, Alzheimer dementia, legal blindness lives at home with wife presented to [facility] ER for evaluation of confusion and fevers.  As per wife patient has been confused for roughly 2 days, went to dialysis on 1/13, reportedly completed the session but was noted to have fever of 102 with some agitation and possible confusion.  Patient having mild drainage from his wound on the right shin.  At home patient does relate with assistance. As per the granddaughter he has been having memory issues for past 2 to 3 years and for last 1 week has been worsening, making random calls over phone and not being himself.  In the ER, [facility] EKG with sinus rhythm, chest x-ray pulmonary vascular congestion but no edema or consolidation, CT head negative for acute abnormality, routine lab work CBC fairly stable, influenza PCR negative troponin was borderline positive, UA notes history of infection.1 L normal saline, Zyvox and Haldol in the ED.  He was transferred to [facility]. She was admitted, managed with IV antibiotics for this wound.  It is nicely improving, no more erythema redness present on his right lower leg.  His mental status seems overall stable and improved.Seen by PT OT and has recommended home health and will set up home health PT OT RN and aide on discharge.  07/19/2018: Patient seen during hemodialysis.  Prior documentations noted.  Patient is awaiting disposition.  Nephrology input is highly appreciated.  07/20/2018: Patient seen.  Patient continues to improve daily.  Updated patient's wife extensively yesterday.  For fistulogram on Friday, 07/21/2018.  Encephalopathy seems to be improving.  CT head done on presentation did not show any new  findings.  Assessment & Plan:   Cellulitis of right leg with right shin ulcer w some draiange : Overall nicely improved right shin wound, continue local wound care, continue oral doxycycline. He has very good dopplerable pulses but ABI difficult to be obtained due to his leg wound, will need outpatient vascular surgery referral.   07/19/2018: Stable.  Acute encephalopathy, likely toxic metabolic in the setting of dementia. Family notes that he has memory issues and has been acting unusual for last 1 week.Repeat CT head is unremarkable for acute finding.  Has PM and MRI may not be feasible.  His mental status overall improved and appears to be  at baseline-alert, awake, oriented to place, self, people and is communicative.  He does have baseline dementia/memory issues. W/u with Ammonia, tsh, B12 ,RPR, hiv are all normal/negative. Continue supportive care with home meds and family at home.  CT head x2 without any acute finding. Cont low dose Seroquel bedtime. 07/20/2018: Continues to improve.  Negative CT head on presentation.   Fall,?  Left forehead injury.  No obvious injury on the head and CT head on repeat was unremarkable.  Educated on fall precaution and safety.  PT OT appreciated.  Look into skilled nursing facility.  Hypertensive urgency :  BP stable.   Continue on Coreg, amlodipine.    DM (diabetes mellitus), type 2 with renal complications:  Controlled, cont current insulin insulin.   Chronic diastolic heart failure: Compensated, continue on Lasix  End stage renal disease on HD MWF:  Due for dialysis on Monday.   History of stroke:  Overall nonfocal and stable.  He is on aspirin, Plavix and Zetia.  CT HEAD x2 unremarkable for acute finding  DVT prophylaxis: SQ HEPARIN  Code Status: FULL Family Communication: Wife was communicated Disposition Plan: Awaiting for skilled nursing facility placement.  as family unable to provide 24 x7 care at home  Consultants:  Nephrology  Procedures: Hemodialysis  Antimicrobials: Vancomycin 07/11/2018 >>07/15/17  Subjective: Patient continues to improve. No new complaints. For hemodialysis and fistulogram tomorrow.  Objective: Vitals:   07/19/18 2150 07/20/18 0023 07/20/18 0544 07/20/18 0745  BP: 122/75  (!) 145/85 140/81  Pulse: 72 77 75 75  Resp: 18 19 18 18   Temp: 98.1 F (36.7 C)  (!) 97.4 F (36.3 C) 97.7 F (36.5 C)  TempSrc: Oral  Oral Oral  SpO2: 99% 97% 93% 98%  Weight:      Height:        Intake/Output Summary (Last 24 hours) at 07/20/2018 1634 Last data filed at 07/20/2018 0700 Gross per 24 hour  Intake 300 ml  Output 1 ml  Net 299 ml   Filed Weights   07/18/18 2030 07/19/18 0645 07/19/18 1125  Weight: 102.1 kg 102.2 kg 99.2 kg   Weight change: -2.9 kg  Body mass index is 32.3 kg/m.  Examination:  General exam: Calm, comfortable, not in acute distress.  Patient is obese. HEENT: Patient is legally blind. Respiratory system: Decreased air entry globally.   Cardiovascular system: S1-S2 heard.  No leg edema.  Right lower extremity wound is dressed.   Gastrointestinal system: Abdomen is obese, soft and nontender.  Organs are difficult to assess.   Nervous System: Awake and alert, but blind.   Extremities: No leg edema.  Right lower extremity wound is dressed.  Medications:  Scheduled Meds: . iopamidol      . amLODipine  10 mg Oral QPC supper  . aspirin EC  81 mg Oral Daily  . atropine  1 drop Left Eye QHS  . calcitRIOL  0.25 mcg Oral Daily  . carvedilol  25 mg Oral BID  . Chlorhexidine Gluconate Cloth  6 each Topical Q0600  . clopidogrel  75 mg Oral Daily  . doxycycline  100 mg Oral Q12H  . ezetimibe  10 mg Oral QPM  . feeding supplement (NEPRO CARB STEADY)  237 mL Oral BID BM  . finasteride  5 mg Oral Daily  . folic acid  2 mg Oral Daily  . insulin aspart  0-5 Units Subcutaneous QHS  . insulin aspart  0-9 Units Subcutaneous TID WC  . polyethylene glycol  17 g Oral  BID  . prednisoLONE acetate  1 drop Left Eye QHS  . QUEtiapine  12.5 mg Oral QHS  . senna-docusate  2 tablet Oral Daily  . sodium chloride flush  3 mL Intravenous Q12H   Continuous Infusions: . sodium chloride    . sodium chloride Stopped (07/12/18 0318)    Data Reviewed: I have personally reviewed following labs and imaging studies  CBC: Recent Labs  Lab 07/14/18 0720 07/17/18 0808 07/19/18 0730 07/20/18 0422  WBC 6.3 6.9 6.8 6.4  HGB 11.5* 12.2* 11.9* 11.9*  HCT 36.7* 38.1* 37.4* 36.6*  MCV 85.2 86.6 86.6 85.9  PLT 168 194 194 841   Basic Metabolic Panel: Recent Labs  Lab 07/14/18 0720 07/17/18 0807 07/19/18 0730  NA 135 135 136  K 3.5 3.9 4.1  CL 96* 97* 99  CO2 25 25 24   GLUCOSE 117* 96 70  BUN 29* 38* 39*  CREATININE 5.70* 6.34* 5.48*  CALCIUM 8.3* 8.2* 8.4*  PHOS 4.8* 5.1* 4.7*   GFR: Estimated Creatinine Clearance: 15.4 mL/min (A) (by C-G formula based on SCr of 5.48 mg/dL (H)). Liver Function Tests: Recent Labs  Lab 07/14/18 0720 07/17/18 0807 07/19/18 0730  ALBUMIN 3.1* 2.9* 3.1*   No results for input(s): LIPASE, AMYLASE in the last 168 hours. No results for input(s): AMMONIA in the last 168 hours. Coagulation Profile: Recent Labs  Lab 07/20/18 0422  INR 1.36   Cardiac Enzymes: No results for input(s): CKTOTAL, CKMB, CKMBINDEX, TROPONINI in the last 168 hours. BNP (last 3 results) No results for input(s): PROBNP in the last 8760 hours. HbA1C: No results for input(s): HGBA1C in the last 72 hours. CBG: Recent Labs  Lab 07/19/18 1326 07/19/18 1658 07/19/18 2146 07/20/18 0746 07/20/18 1148  GLUCAP 102* 159* 142* 123* 120*   Lipid Profile: No results for input(s): CHOL, HDL, LDLCALC, TRIG, CHOLHDL, LDLDIRECT in the last 72 hours. Thyroid Function Tests: No results for input(s): TSH, T4TOTAL, FREET4, T3FREE, THYROIDAB in the last 72 hours. Anemia Panel: No results for input(s): VITAMINB12, FOLATE, FERRITIN, TIBC, IRON, RETICCTPCT in  the last 72 hours. Sepsis Labs: No results for input(s): PROCALCITON, LATICACIDVEN in the last 168 hours.  Recent Results (from the past 240 hour(s))  MRSA PCR Screening     Status: None   Collection Time: 07/11/18  9:33 PM  Result Value Ref Range Status   MRSA by PCR NEGATIVE NEGATIVE Final    Comment:        The GeneXpert MRSA Assay (FDA approved for NASAL specimens only), is one component of a comprehensive MRSA colonization surveillance program. It is not intended to diagnose MRSA infection nor to guide or monitor treatment for MRSA infections. Performed at [facility], [place] [address], [place], [place] [address]       Radiology Studies: No results found.    LOS: 8 days   Bonnell Public, MD [facility]  07/20/2018, 4:34 PM

## 2018-07-20 NOTE — Progress Notes (Addendum)
[facility] Progress Note   Dialysis Orders: MWF -[place] 4.5hrs, [id number], [id number], EDW 108kg,2K/2.25Ca Access:LU AVF Heparin5000 unit bolus Mircera29mcg q2wks - last 06/07/18 Calcitriol0.89mcg PO qHD  Assessment/Plan: 1. Acute encephalopathy -infection vs. HTN. Labs reassuring. RPR negative.Unsure of baseline mental function, per family worse than usual over the last week.CT head on 1/15 w/no acute findings. Improved. More alert today- speech clear 2. Cellulitis of R leg w/R shin ulcer - now on doxy 3. ESRD - On HD MWF.- requiring added K bathK 4.1 Wed. Plan HD Friday after fistulogram. 4. Hypertension/volume -net UF 3 L Monday and Wed - averaging that amount most tmt - lowering EDW gently -EDW down to 102 Monday and 99.2 Wed- LE edema improved 5. Anemia of CKD - Hgb 12.0> 11.5>12.2 No indication for ESA at this time- labs pending 6. Secondary Hyperparathyroidism - Ca/phos in goal. Continue VDRA. Not on binders 7. Nutrition - Renal diet w/fluid restrictions. Vitamins.alb 3.1 - added nepo/ unintentional weight loss 8. Disp - CM/SW working with patient and family - see 1/17 note for details -being CLIPPED to a [facility] unit in [place], [place] plans still pending 9.   Access issue -prolonged bleeding x several treatments- including yesterday even when heparin held.  Last PTA 85/63 had 14% basilic vein stenosis with AF noted to be decreased when checked just prior to this admission.  Appreciate IR assistance - plan for fistulagram Friday -   Myriam Jacobson, PA-C [facility] Beeper [phone] 07/20/2018,9:18 AM  LOS: 8 days   Pt seen, examined and agree w A/P as above.  Kelly Splinter MD [employer] pager [phone]   07/20/2018, 2:51 PM   Subjective:   No c/o.  Discussed football- prefers the [employer]. Explained plan for AVF and he notes no bleeding problems today.    Objective Vitals:   07/19/18 2150 07/20/18  0023 07/20/18 0544 07/20/18 0745  BP: 122/75  (!) 145/85 140/81  Pulse: 72 77 75 75  Resp: 18 19 18 18   Temp: 98.1 F (36.7 C)  (!) 97.4 F (36.3 C) 97.7 F (36.5 C)  TempSrc: Oral  Oral Oral  SpO2: 99% 97% 93% 98%  Weight:      Height:       Physical Exam goal 3 L General: in supine breathing easily Heart: RRR Lungs: grossly clear Abdomen: obese, distended nontender + BS Extremities: RLE tr edema - dressing mid shin. LLE no significant edema/ skin flakey over shin Dialysis Access:  Left AVF + bruit    Additional Objective Labs: Basic Metabolic Panel: Recent Labs  Lab 07/14/18 0720 07/17/18 0807 07/19/18 0730  NA 135 135 136  K 3.5 3.9 4.1  CL 96* 97* 99  CO2 25 25 24   GLUCOSE 117* 96 70  BUN 29* 38* 39*  CREATININE 5.70* 6.34* 5.48*  CALCIUM 8.3* 8.2* 8.4*  PHOS 4.8* 5.1* 4.7*   Liver Function Tests: Recent Labs  Lab 07/14/18 0720 07/17/18 0807 07/19/18 0730  ALBUMIN 3.1* 2.9* 3.1*   No results for input(s): LIPASE, AMYLASE in the last 168 hours. CBC: Recent Labs  Lab 07/14/18 0720 07/17/18 0808 07/19/18 0730 07/20/18 0422  WBC 6.3 6.9 6.8 6.4  HGB 11.5* 12.2* 11.9* 11.9*  HCT 36.7* 38.1* 37.4* 36.6*  MCV 85.2 86.6 86.6 85.9  PLT 168 194 194 176   Blood Culture No results found for: SDES, SPECREQUEST, CULT, REPTSTATUS  Cardiac Enzymes: No results for input(s): CKTOTAL, CKMB, CKMBINDEX, TROPONINI in the last 168 hours. CBG:  Recent Labs  Lab 07/19/18 1224 07/19/18 1326 07/19/18 1658 07/19/18 2146 07/20/18 0746  GLUCAP 63* 102* 159* 142* 123*   Iron Studies: No results for input(s): IRON, TIBC, TRANSFERRIN, FERRITIN in the last 72 hours. Lab Results  Component Value Date   INR 1.36 07/20/2018   INR 0.95 03/08/2010   Studies/Results: No results found. Medications: . sodium chloride    . sodium chloride Stopped (07/12/18 0318)   . amLODipine  10 mg Oral QPC supper  . aspirin EC  81 mg Oral Daily  . atropine  1 drop Left Eye QHS   . calcitRIOL  0.25 mcg Oral Daily  . carvedilol  25 mg Oral BID  . Chlorhexidine Gluconate Cloth  6 each Topical Q0600  . clopidogrel  75 mg Oral Daily  . doxycycline  100 mg Oral Q12H  . ezetimibe  10 mg Oral QPM  . feeding supplement (NEPRO CARB STEADY)  237 mL Oral BID BM  . finasteride  5 mg Oral Daily  . folic acid  2 mg Oral Daily  . insulin aspart  0-5 Units Subcutaneous QHS  . insulin aspart  0-9 Units Subcutaneous TID WC  . polyethylene glycol  17 g Oral BID  . prednisoLONE acetate  1 drop Left Eye QHS  . QUEtiapine  12.5 mg Oral QHS  . senna-docusate  2 tablet Oral Daily  . sodium chloride flush  3 mL Intravenous Q12H

## 2018-07-20 NOTE — Consult Note (Signed)
Chief Complaint: Patient was seen in consultation today for fistulagram with possible intervention  Referring Physician(s): Alric Seton, PA-C  Supervising Physician: Jacqulynn Cadet  Patient Status: [facility] - In-pt  History of Present Illness: Gregory Hood is a 68 y.o. male with a past medical history significant for OSA, TIA, HLD, HTN, DM, COPD and ESRD on dialysis via left upper extremity fistula MWF. Per chart patient has experienced prolonged bleeding after last two treatments, even after heparin was held on last treatment - request has been made to IR for fistulagram with possible intervention prior to discharge so that patient may have reliable access.  Patient is unable to provide much history, asks for a sandwich several times during exam. He denies any pain at his fistula - he states he wants to go home. Spoke with patient's wife Gregory Hood via phone yesterday and today - initially she was reluctant to have fistulagram performed because of the risk of damage to the fistula resulting in need for HD catheter placement. She also expressed concern that he has had a stroke and felt that this should be worked up prior to any other procedures. Spoke with [name] again today via phone and she is agreeable to proceed with fistulagram as well as possible HD catheter placement if needed.   Past Medical History:  Diagnosis Date  . Chronic kidney disease   . COPD (chronic obstructive pulmonary disease) ([place])   . Diabetes mellitus without complication ([place])   . Diabetic retinopathy ([place])   . Hyperlipidemia   . Hypertension   . Obesity   . Orthostatic hypotension   . OSA on CPAP   . Retinal detachment   . Seizures ([place])   . Stroke ([place])   . TIA (transient ischemic attack)   . Umbilical hernia     Past Surgical History:  Procedure Laterality Date  . BASCILIC VEIN TRANSPOSITION Left 03/06/2015   Procedure: LET BASCILIC VEIN TRANSPOSITION;  Surgeon: Angelia Mould, MD;  Location: [facility];  Service: Vascular;  Laterality: Left;  . CIRCUMCISION    . EYE SURGERY Bilateral    had surgery several different times.  Marland Kitchen HAMMER TOE SURGERY Left 30 years ago   small toe left toe  . HERNIA REPAIR    . SEPTOPLASTY  20 years ago    Allergies: Bee venom; Lexiscan [regadenoson]; and Ace inhibitors  Medications: Prior to Admission medications   Medication Sig Start Date End Date Taking? Authorizing Provider  acetaminophen (TYLENOL) 500 MG tablet Take 500 mg by mouth every 6 (six) hours as needed for mild pain or moderate pain.   Yes [provider]  amLODipine (NORVASC) 10 MG tablet Take 1 tablet (10 mg total) by mouth daily. 03/31/18  Yes Hongalgi, Lenis Dickinson, MD  atropine 1 % ophthalmic solution Place 1 drop into the left eye daily.  06/30/18 06/30/19 Yes [provider]  carvedilol (COREG) 25 MG tablet Take 25 mg by mouth 2 (two) times daily. 01/31/18 01/31/19 Yes [provider]  cholecalciferol 2000 units TABS Take 1 tablet (2,000 Units total) by mouth daily. 03/31/18  Yes Hongalgi, Lenis Dickinson, MD  clopidogrel (PLAVIX) 75 MG tablet Take 75 mg by mouth daily. 03/10/18  Yes [provider]  finasteride (PROSCAR) 5 MG tablet Take 5 mg by mouth daily. 08/23/17 08/23/18 Yes [provider]  folic acid (FOLVITE) 1 MG tablet Take 2 tablets (2 mg total) by mouth daily. 04/03/14  Yes Josue Hector, MD  hydrALAZINE (APRESOLINE)  25 MG tablet Take 25 mg by mouth every 8 (eight) hours.    Yes [provider]  polyethylene glycol (MIRALAX / GLYCOLAX) packet Take 17 g by mouth 2 (two) times daily. Patient taking differently: Take 17 g by mouth 2 (two) times daily as needed for mild constipation.  03/30/18  Yes Hongalgi, Lenis Dickinson, MD  Red Yeast Rice 600 MG CAPS Take 1 capsule by mouth daily.   Yes [provider]  senna-docusate (SENOKOT-S) 8.6-50 MG tablet Take 2 tablets by mouth daily as needed for mild constipation.  02/09/18  Yes [provider]  aspirin 81 MG tablet Take 1 tablet (81 mg total) by mouth daily. Patient not taking: Reported on 07/12/2018 03/30/18   Modena Jansky, MD  calcitRIOL (ROCALTROL) 0.25 MCG capsule Take 1 capsule (0.25 mcg total) by mouth daily. Patient not taking: Reported on 07/12/2018 03/30/18   Modena Jansky, MD  doxycycline (VIBRA-TABS) 100 MG tablet Take 1 tablet (100 mg total) by mouth every 12 (twelve) hours for 5 days. 07/18/18 07/23/18  Antonieta Pert, MD  furosemide (LASIX) 80 MG tablet Take 1 tablet (80 mg total) by mouth 2 (two) times daily. Patient not taking: Reported on 07/12/2018 03/30/18   Modena Jansky, MD  [facility] VERIO test strip  01/08/14   [provider]  prednisoLONE acetate (PRED FORTE) 1 % ophthalmic suspension Place 1 drop into the left eye at bedtime. 07/18/18   Antonieta Pert, MD  QUEtiapine (SEROQUEL) 25 MG tablet Take 0.5 tablets (12.5 mg total) by mouth at bedtime. 07/18/18   Antonieta Pert, MD  senna-docusate (SENOKOT-S) 8.6-50 MG tablet Take 2 tablets by mouth daily. 07/19/18   Antonieta Pert, MD     Family History  Problem Relation Age of Onset  . Hypertension Mother   . Diabetes type II Mother   . Transient ischemic attack Mother   . Kidney disease Mother   . Diabetes Mother   . Varicose Veins Mother   . Hypertension Father   . Diabetes type II Father   . Diabetes Father   . Diabetes type II Sister   . Hypertension Sister   . Diabetes Sister   . Learning disabilities Maternal Uncle     Social History   Socioeconomic History  . Marital status: Married    Spouse name: Not on file  . Number of children: Not on file  . Years of education: Not on file  . Highest education level: Not on file  Occupational History  . Not on file  Social Needs  . Financial resource strain: Not on file  . Food insecurity:    Worry: Not on file    Inability: Not on file  . Transportation needs:    Medical: Not on file    Non-medical: Not on file  Tobacco Use  . Smoking status:  Former Smoker    Types: Cigarettes    Last attempt to quit: 03/05/1974    Years since quitting: 44.4  . Smokeless tobacco: Never Used  Substance and Sexual Activity  . Alcohol use: No    Alcohol/week: 0.0 standard drinks  . Drug use: No  . Sexual activity: Not Currently  Lifestyle  . Physical activity:    Days per week: Not on file    Minutes per session: Not on file  . Stress: Not on file  Relationships  . Social connections:    Talks on phone: Not on file    Gets together: Not on file  Attends religious service: Not on file    Active member of club or organization: Not on file    Attends meetings of clubs or organizations: Not on file    Relationship status: Not on file  Other Topics Concern  . Not on file  Social History Narrative  . Not on file     Review of Systems: A 12 point ROS discussed and pertinent positives are indicated in the HPI above.  All other systems are negative.  Review of Systems  Unable to perform ROS: Dementia    Vital Signs: BP 140/81 (BP Location: Right Arm)   Pulse 75   Temp 97.7 F (36.5 C) (Oral)   Resp 18   Ht 5\' 9"  (1.753 m)   Wt 218 lb 11.1 oz (99.2 kg)   SpO2 98%   BMI 32.30 kg/m   Physical Exam Vitals signs and nursing note reviewed.  Constitutional:      General: He is not in acute distress. HENT:     Head: Normocephalic.  Eyes:     Comments: (+) blind left eye  Cardiovascular:     Rate and Rhythm: Normal rate and regular rhythm.     Comments: LUE AVF (+) thrill, (+) bruit Pulmonary:     Effort: Pulmonary effort is normal.     Breath sounds: Normal breath sounds.  Abdominal:     General: There is no distension.     Palpations: Abdomen is soft.     Tenderness: There is no abdominal tenderness.  Skin:    General: Skin is warm and dry.  Neurological:     Mental Status: He is alert. Mental status is at baseline.      MD Evaluation Airway: WNL Heart: WNL Abdomen: WNL Chest/ Lungs: WNL ASA  Classification:  3 Mallampati/Airway Score: Two   Imaging: Ct Head Wo Contrast  Result Date: 07/12/2018 CLINICAL DATA:  68 year old who presented this admission with fever and confusion. Patient fell yesterday and has ataxia. EXAM: CT HEAD WITHOUT CONTRAST TECHNIQUE: Contiguous axial images were obtained from the base of the skull through the vertex without intravenous contrast. COMPARISON:  07/10/2018, 01/21/2018 and earlier. FINDINGS: Patient motion blurred images of the skull base but the study does appear diagnostic. Brain: Moderate cortical, deep and cerebellar atrophy, unchanged. Severe changes of small vessel disease of the white matter diffusely, unchanged. Old lacunar strokes involving the basal ganglia bilaterally, unchanged. Physiologic calcifications in the basal ganglia, unchanged. No mass lesion. No midline shift. No acute hemorrhage or hematoma. No extra-axial fluid collections. No evidence of acute infarction. No new/acute abnormalities involving the brain since the examination 2 days ago. Vascular: Severe BILATERAL carotid siphon and LEFT vertebral artery atherosclerosis. No hyperdense vessel. Skull: No skull fracture or other focal osseous abnormality involving the skull. Sinuses/Orbits: Visualized paranasal sinuses, BILATERAL mastoid air cells and BILATERAL middle ear cavities well-aerated. LEFT phthisis bulbi. Normal-appearing RIGHT orbit and globe. Other: None. IMPRESSION: 1. No acute intracranial abnormality. 2. Stable moderate generalized atrophy and severe chronic microvascular ischemic changes of the white matter. Electronically Signed   By: Evangeline Dakin M.D.   On: 07/12/2018 11:53   Vas [place] Burnard Bunting With/wo Tbi  Result Date: 07/16/2018 LOWER EXTREMITY DOPPLER STUDY Indications: Ulceration. High Risk Factors: Diabetes.  Limitations: IV and AVF graft on site; ulcer patches on ankle, patient cannot              tolerate the pressure cuff's compression. Comparison Study: No comparison study available  Performing Technologist: Rudell Cobb  RDMS RVT  Examination Guidelines: A complete evaluation includes at minimum, Doppler waveform signals and systolic blood pressure reading at the level of bilateral brachial, anterior tibial, and posterior tibial arteries, when vessel segments are accessible. Bilateral testing is considered an integral part of a complete examination. Photoelectric Plethysmograph (PPG) waveforms and toe systolic pressure readings are included as required and additional duplex testing as needed. Limited examinations for reoccurring indications may be performed as noted.  ABI Findings: +---------+------------------+-----+----------+--------------------------------+ Right    Rt Pressure (mmHg)IndexWaveform  Comment                          +---------+------------------+-----+----------+--------------------------------+ Brachial 141                    triphasic                                  +---------+------------------+-----+----------+--------------------------------+ PTA                             monophasicpressure unable to obtain due to                                           patient has ulcer and patches on                                           ankle                            +---------+------------------+-----+----------+--------------------------------+ DP                              monophasicpressure unable to obtain due to                                           patient has ulcer and patches on                                           ankle                            +---------+------------------+-----+----------+--------------------------------+ Great Toe96                0.68                                            +---------+------------------+-----+----------+--------------------------------+ +---------+------------------+-----+-------------------+-----------------------+ Left     Lt Pressure  (mmHg)IndexWaveform           Comment                 +---------+------------------+-----+-------------------+-----------------------+ Brachial  triphasic          AVF graft on site with                                                     bandages                +---------+------------------+-----+-------------------+-----------------------+ PTA      144               1.02 monophasic                                 +---------+------------------+-----+-------------------+-----------------------+ DP       44                0.31 dampened monophasic                        +---------+------------------+-----+-------------------+-----------------------+ Great Toe78                0.55                                            +---------+------------------+-----+-------------------+-----------------------+ +-------+-----------+-----------+------------+------------+ ABI/TBIToday's ABIToday's TBIPrevious ABIPrevious TBI +-------+-----------+-----------+------------+------------+ Right             0.68                                +-------+-----------+-----------+------------+------------+ Left   1.02       0.55                                +-------+-----------+-----------+------------+------------+  Summary: Right: The right toe-brachial index is abnormal. Left: Resting left ankle-brachial index is within normal range. No evidence of significant left lower extremity arterial disease. The left toe-brachial index is abnormal.  *See table(s) above for measurements and observations.  Electronically signed by Harold Barban MD on 07/16/2018 at 5:04:14 PM.    Final     Labs:  CBC: Recent Labs    07/14/18 0720 07/17/18 0808 07/19/18 0730 07/20/18 0422  WBC 6.3 6.9 6.8 6.4  HGB 11.5* 12.2* 11.9* 11.9*  HCT 36.7* 38.1* 37.4* 36.6*  PLT 168 194 194 176    COAGS: Recent Labs    07/20/18 0422  INR 1.36    BMP: Recent Labs     07/12/18 0504 07/14/18 0720 07/17/18 0807 07/19/18 0730  NA 136 135 135 136  K 4.0 3.5 3.9 4.1  CL 93* 96* 97* 99  CO2 28 25 25 24   GLUCOSE 131* 117* 96 70  BUN 28* 29* 38* 39*  CALCIUM 8.6* 8.3* 8.2* 8.4*  CREATININE 5.96* 5.70* 6.34* 5.48*  GFRNONAA 9* 10* 8* 10*  GFRAA 10* 11* 10* 12*    LIVER FUNCTION TESTS: Recent Labs    03/22/18 0115  03/25/18 0538 07/14/18 0720 07/17/18 0807 07/19/18 0730  BILITOT 0.9  --   --   --   --   --   AST 20  --   --   --   --   --  ALT 19  --   --   --   --   --   ALKPHOS 54  --   --   --   --   --   PROT 7.3  --   --   --   --   --   ALBUMIN 3.7   < > 2.9* 3.1* 2.9* 3.1*   < > = values in this interval not displayed.    TUMOR MARKERS: No results for input(s): AFPTM, CEA, CA199, CHROMGRNA in the last 8760 hours.  Assessment and Plan:  Patient with history of ESRD on dialysis via LUE fistula typically MWF - after previous two treatments patient has had prolonged bleeding, heparin was not used during last treatment and bleeding did not improve. Per nephrology last intervention 10/30 at [facility] - PTA of 51% basilic veins stenosis. Request for fistulagram with possible intervention prior to discharge to ensure reliable dialysis access.  Patient has been NPO since midnight, he takes Plavix 75 mg. Afebrile, WBC 6.4, hgb 11.9, plt 176, INR 1.36.  Risks and benefits discussed with the patient's wife Gregory Hood via phone including, but not limited to bleeding, infection, vascular injury, pulmonary embolism, need for tunneled HD catheter placement or even death.  All of the patient's wife's questions were answered, patient's wife is agreeable to proceed.  Consent signed and in IR control room.  Thank you for this interesting consult.  I greatly enjoyed meeting BLUFORD SEDLER and look forward to participating in their care.  A copy of this report was sent to the requesting provider on this date.  Electronically Signed: Joaquim Nam,  PA-C 07/20/2018, 8:03 AM   I spent a total of 40 Minutes  in face to face in clinical consultation, greater than 50% of which was counseling/coordinating care for fistulagram with possible intervention.

## 2018-07-20 NOTE — Clinical Social Work Note (Addendum)
Dialysis has been set-up at [facility] in [place], [place]. Patient will start at [facility] on 1/28 at 11:15 am and his tentative schedule is Tuesday, Thursday, Saturday at 12:00 noon. Wife provided with HD information and admissions director at [facility] contacted and updated.   Patient can d/c tomorrow after dialysis (1st shift), however patient had a sitter today that was d/c'd at 3 pm and he will need to be sitter free for 24 hours before discharge to [facility]. Josh, admissions director was advised of sitter and agreed that patient must be sitter free for 24 hours. Paged MD to provide update. CSW will continue to follow and facilitate discharge to [facility] facility on Friday, 1/24.  Larraine Argo Givens, MSW, LCSW Licensed Clinical Social Worker [place] [phone]

## 2018-07-20 NOTE — Procedures (Signed)
Interventional Radiology Procedure Note  Procedure: Left upper arm fistulogram and PTA of outflow stenosis to 8 mm.    Complications: None  Estimated Blood Loss: None  Recommendations: - Return to room - Remove pursestring sutures in 48 hrs  Signed,  Criselda Peaches, MD

## 2018-07-21 LAB — RENAL FUNCTION PANEL
Albumin: 3.1 g/dL — ABNORMAL LOW (ref 3.5–5.0)
Anion gap: 11 (ref 5–15)
BUN: 36 mg/dL — ABNORMAL HIGH (ref 8–23)
CO2: 25 mmol/L (ref 22–32)
Calcium: 8.7 mg/dL — ABNORMAL LOW (ref 8.9–10.3)
Chloride: 99 mmol/L (ref 98–111)
Creatinine, Ser: 5.11 mg/dL — ABNORMAL HIGH (ref 0.61–1.24)
GFR calc Af Amer: 13 mL/min — ABNORMAL LOW (ref >60)
GFR calc non Af Amer: 11 mL/min — ABNORMAL LOW (ref >60)
GLUCOSE: 120 mg/dL — AB (ref 70–99)
Phosphorus: 4.5 mg/dL (ref 2.5–4.6)
Potassium: 4.3 mmol/L (ref 3.5–5.1)
Sodium: 135 mmol/L (ref 135–145)

## 2018-07-21 LAB — CBC WITH DIFFERENTIAL/PLATELET
Abs Immature Granulocytes: 0.02 10*3/uL (ref 0.00–0.07)
Basophils Absolute: 0 10*3/uL (ref 0.0–0.1)
Basophils Relative: 0 %
Eosinophils Absolute: 0.1 10*3/uL (ref 0.0–0.5)
Eosinophils Relative: 2 %
HCT: 37.2 % — ABNORMAL LOW (ref 39.0–52.0)
Hemoglobin: 11.5 g/dL — ABNORMAL LOW (ref 13.0–17.0)
Immature Granulocytes: 0 %
Lymphocytes Relative: 8 %
Lymphs Abs: 0.5 10*3/uL — ABNORMAL LOW (ref 0.7–4.0)
MCH: 26.7 pg (ref 26.0–34.0)
MCHC: 30.9 g/dL (ref 30.0–36.0)
MCV: 86.3 fL (ref 80.0–100.0)
Monocytes Absolute: 0.6 10*3/uL (ref 0.1–1.0)
Monocytes Relative: 11 %
Neutro Abs: 4.4 10*3/uL (ref 1.7–7.7)
Neutrophils Relative %: 79 %
Platelets: 163 10*3/uL (ref 150–400)
RBC: 4.31 MIL/uL (ref 4.22–5.81)
RDW: 17.9 % — ABNORMAL HIGH (ref 11.5–15.5)
WBC: 5.7 10*3/uL (ref 4.0–10.5)
nRBC: 0 % (ref 0.0–0.2)

## 2018-07-21 LAB — GLUCOSE, CAPILLARY
Glucose-Capillary: 113 mg/dL — ABNORMAL HIGH (ref 70–99)
Glucose-Capillary: 82 mg/dL (ref 70–99)
Glucose-Capillary: 129 mg/dL — ABNORMAL HIGH (ref 70–99)

## 2018-07-21 MED ORDER — SODIUM CHLORIDE 0.9 % IV SOLN: 100.0000 mL | INTRAVENOUS | Status: DC | PRN

## 2018-07-21 MED ORDER — HEPARIN SODIUM (PORCINE) 1000 UNIT/ML DIALYSIS: 20.0000 [IU]/kg | INTRAMUSCULAR | Status: DC | PRN

## 2018-07-21 MED ORDER — ALTEPLASE 2 MG IJ SOLR: 2.0000 mg | Freq: Once | INTRAMUSCULAR | Status: DC | PRN

## 2018-07-21 MED ORDER — HEPARIN SODIUM (PORCINE) 1000 UNIT/ML DIALYSIS: 1000.0000 [IU] | INTRAMUSCULAR | Status: DC | PRN

## 2018-07-21 MED ORDER — LIDOCAINE-PRILOCAINE 2.5-2.5 % EX CREA: 1.0000 "application " | TOPICAL_CREAM | CUTANEOUS | Status: DC | PRN

## 2018-07-21 MED ORDER — LIDOCAINE HCL (PF) 1 % IJ SOLN: 5.0000 mL | INTRAMUSCULAR | Status: DC | PRN

## 2018-07-21 MED ORDER — PENTAFLUOROPROP-TETRAFLUOROETH EX AERO: 1.0000 "application " | INHALATION_SPRAY | CUTANEOUS | Status: DC | PRN

## 2018-07-21 NOTE — Care Management Note (Signed)
Case Management Note Manya Silvas, RN MSN CCM Transitions of Care [facility] [place] [phone]  Patient Details  Name: Gregory Hood MRN: [id number] Date of Birth: 1950-08-19  Subjective/Objective:          R leg celllulitis          Action/Plan: PTA home with [facility] services (RN, Aide[facility] with spouse this afternoon who stated that she is unable to provide 24 hour supervision. States that her son, Darnelle Maffucci, in [place], [place] wants him to be placed in a facility near him, which would involve changing his dialysis center. Darnelle Maffucci has provided CSW with a couple facilities to contact. Will continue to follow for transition of care needs.   Expected Discharge Date:  07/18/18               Expected Discharge Plan:  Skilled Nursing Facility  In-House Referral:  Clinical Social Work  Discharge planning Services  CM Consult  Post Acute Care Choice:  NA Choice offered to:  NA  DME Arranged:  N/A DME Agency:  NA  HH Arranged:  NA HH Agency:  NA  Status of Service:  Completed, signed off  If discussed at [employer] of Stay Meetings, dates discussed:    Additional Comments: 07/21/2018-Anticipate transition to SNF in [place], [place]. Pt to transfer to [facility] in [place], [place] approved by nephrologist in [place]. Pt is unable to transition from SNF until after 3pm. Advised by CSW that SNF will be unable to get meds for patient take in the evening. CM discussed situation with MD. MD stated that it is ok for patient to get 1800 and 2200 meds prior to transition to SNF. Pt will also need sutures removed from fistula site tomorrow per radiology note to remove after 48 hours. Message sent to MD. No other needs identified.   [name], RN 07/21/2018, 1:04 PM

## 2018-07-21 NOTE — Progress Notes (Addendum)
OT Cancellation Note  Patient Details Name: Gregory Hood MRN: [id number] DOB: 05/31/1951   Cancelled Treatment:    Reason Eval/Treat Not Completed: Patient at procedure or test/ unavailable. Pt at HD and per SW note 07/20/2018, pt to d/c to SNF after HD this afternoon  [name] 07/21/2018, 9:08 AM

## 2018-07-21 NOTE — Progress Notes (Signed)
PROGRESS NOTE    Gregory Hood  IHK:[id number] DOB: 09/08/1950 DOA: 07/11/2018 PCP: Jilda Panda, MD   Hospital course: 68 year old male with history of ESRD on HD MWF, chronic diastolic CHF, hypertension, history of CVA, chronic lower extremity wounds, Alzheimer dementia, legal blindness lives at home with wife presented to [facility] ER for evaluation of confusion and fevers.  As per wife patient has been confused for roughly 2 days, went to dialysis on 1/13, reportedly completed the session but was noted to have fever of 102 with some agitation and possible confusion.  Patient having mild drainage from his wound on the right shin.  At home patient does relate with assistance. As per the granddaughter he has been having memory issues for past 2 to 3 years and for last 1 week has been worsening, making random calls over phone and not being himself.  In the ER, [facility] EKG with sinus rhythm, chest x-ray pulmonary vascular congestion but no edema or consolidation, CT head negative for acute abnormality, routine lab work CBC fairly stable, influenza PCR negative troponin was borderline positive, UA notes history of infection.1 L normal saline, Zyvox and Haldol in the ED.  He was transferred to [facility]. She was admitted, managed with IV antibiotics for this wound.  It is nicely improving, no more erythema redness present on his right lower leg.  His mental status seems overall stable and improved.Seen by PT OT and has recommended home health and will set up home health PT OT RN and aide on discharge.  07/19/2018: Patient seen during hemodialysis.  Prior documentations noted.  Patient is awaiting disposition.  Nephrology input is highly appreciated.  07/20/2018: Patient seen.  Patient continues to improve daily.  Updated patient's wife extensively yesterday.  For fistulogram on Friday, 07/21/2018.  Encephalopathy seems to be improving.  CT head done on presentation did not show any new  findings.  07/21/2018: Patient seen on hemodialysis today.  Patient seemed a bit more confused today.  Will monitor patient overnight at the hospital.  Likely discharge tomorrow if patient remains stable.  Assessment & Plan:   Cellulitis of right leg with right shin ulcer w some draiange: Overall nicely improved right shin wound, continue local wound care, continue oral doxycycline. He has very good dopplerable pulses but ABI difficult to be obtained due to his leg wound, will need outpatient vascular surgery referral.   07/19/2018: Stable.  Acute encephalopathy, likely toxic metabolic in the setting of dementia. Family notes that he has memory issues and has been acting unusual for last 1 week.Repeat CT head is unremarkable for acute finding.  Has PM and MRI may not be feasible.  His mental status overall improved and appears to be  at baseline-alert, awake, oriented to place, self, people and is communicative.  He does have baseline dementia/memory issues. W/u with Ammonia, tsh, B12 ,RPR, hiv are all normal/negative. Continue supportive care with home meds and family at home.  CT head x2 without any acute finding. Cont low dose Seroquel bedtime. 07/20/2018: Continues to improve.  Negative CT head on presentation. 07/21/2018: Patient looks more confused today.  Monitor overnight.  If patient remains stable, will discharge patient tomorrow.  Fall, ?Left forehead injury:  No obvious injury on the head and CT head on repeat was unremarkable.  Educated on fall precaution and safety.  PT OT appreciated.  Look into skilled nursing facility.  Hypertensive urgency:  BP stable.   Continue on Coreg, amlodipine.    DM (diabetes mellitus),  type 2 with renal complications:  Controlled, cont current insulin insulin.   Chronic diastolic heart failure: Compensated, continue on Lasix  End stage renal disease on HD MWF: Due for dialysis on Monday.   History of stroke: Overall nonfocal and stable.  He  is on aspirin, Plavix and Zetia.  CT HEAD x2 unremarkable for acute finding  DVT prophylaxis: SQ HEPARIN  Code Status: FULL Family Communication:   Disposition Plan: Likely discharge to skilled nursing facility tomorrow if patient remains stable.    Consultants: Nephrology.  Interventional radiology.  Procedures: Hemodialysis  Antimicrobials: Vancomycin 07/11/2018 >>07/15/2018 Doxycycline started on 07/15/2018  Subjective: Patient seems a bit more confused today.  Objective: Vitals:   07/21/18 1030 07/21/18 1100 07/21/18 1130 07/21/18 1145  BP: (!) 149/92 (!) 139/56 (!) 154/109 (!) 141/96  Pulse: 80 82 80 67  Resp:    17  Temp:    98.9 F (37.2 C)  TempSrc:    Oral  SpO2:    99%  Weight:    96.5 kg  Height:        Intake/Output Summary (Last 24 hours) at 07/21/2018 1336 Last data filed at 07/21/2018 1145 Gross per 24 hour  Intake 400 ml  Output 3000 ml  Net -2600 ml   Filed Weights   07/20/18 2050 07/21/18 0715 07/21/18 1145  Weight: 99.4 kg 99.5 kg 96.5 kg   Weight change: 0.2 kg  Body mass index is 31.42 kg/m.  Examination:  General exam: Calm, comfortable, not in acute distress.  A bit more confused today. HEENT: Patient is legally blind. Respiratory system: Decreased air entry globally.   Cardiovascular system: S1-S2 heard.  No leg edema.  Right lower extremity wound is dressed.   Gastrointestinal system: Abdomen is obese, soft and nontender.  Organs are difficult to assess.   Nervous System: Awake and alert, but blind.   Extremities: No leg edema.  Right lower extremity wound is dressed.  Medications:  Scheduled Meds: . amLODipine  10 mg Oral QPC supper  . aspirin EC  81 mg Oral Daily  . atropine  1 drop Left Eye QHS  . calcitRIOL  0.25 mcg Oral Daily  . carvedilol  25 mg Oral BID  . Chlorhexidine Gluconate Cloth  6 each Topical Q0600  . clopidogrel  75 mg Oral Daily  . doxycycline  100 mg Oral Q12H  . ezetimibe  10 mg Oral QPM  . feeding  supplement (NEPRO CARB STEADY)  237 mL Oral BID BM  . finasteride  5 mg Oral Daily  . folic acid  2 mg Oral Daily  . insulin aspart  0-5 Units Subcutaneous QHS  . insulin aspart  0-9 Units Subcutaneous TID WC  . polyethylene glycol  17 g Oral BID  . prednisoLONE acetate  1 drop Left Eye QHS  . QUEtiapine  12.5 mg Oral QHS  . senna-docusate  2 tablet Oral Daily  . sodium chloride flush  3 mL Intravenous Q12H   Continuous Infusions: . sodium chloride    . sodium chloride Stopped (07/12/18 0318)    Data Reviewed: I have personally reviewed following labs and imaging studies  CBC: Recent Labs  Lab 07/17/18 0808 07/19/18 0730 07/20/18 0422 07/21/18 0741  WBC 6.9 6.8 6.4 5.7  NEUTROABS  --   --   --  4.4  HGB 12.2* 11.9* 11.9* 11.5*  HCT 38.1* 37.4* 36.6* 37.2*  MCV 86.6 86.6 85.9 86.3  PLT 194 194 176 563   Basic Metabolic Panel:  Recent Labs  Lab 07/17/18 0807 07/19/18 0730 07/21/18 0751  NA 135 136 135  K 3.9 4.1 4.3  CL 97* 99 99  CO2 25 24 25   GLUCOSE 96 70 120*  BUN 38* 39* 36*  CREATININE 6.34* 5.48* 5.11*  CALCIUM 8.2* 8.4* 8.7*  PHOS 5.1* 4.7* 4.5   GFR: Estimated Creatinine Clearance: 16.3 mL/min (A) (by C-G formula based on SCr of 5.11 mg/dL (H)). Liver Function Tests: Recent Labs  Lab 07/17/18 0807 07/19/18 0730 07/21/18 0751  ALBUMIN 2.9* 3.1* 3.1*   No results for input(s): LIPASE, AMYLASE in the last 168 hours. No results for input(s): AMMONIA in the last 168 hours. Coagulation Profile: Recent Labs  Lab 07/20/18 0422  INR 1.36   Cardiac Enzymes: No results for input(s): CKTOTAL, CKMB, CKMBINDEX, TROPONINI in the last 168 hours. BNP (last 3 results) No results for input(s): PROBNP in the last 8760 hours. HbA1C: No results for input(s): HGBA1C in the last 72 hours. CBG: Recent Labs  Lab 07/20/18 1148 07/20/18 1739 07/20/18 2054 07/21/18 0648 07/21/18 1305  GLUCAP 120* 115* 167* 113* 82   Lipid Profile: No results for input(s):  CHOL, HDL, LDLCALC, TRIG, CHOLHDL, LDLDIRECT in the last 72 hours. Thyroid Function Tests: No results for input(s): TSH, T4TOTAL, FREET4, T3FREE, THYROIDAB in the last 72 hours. Anemia Panel: No results for input(s): VITAMINB12, FOLATE, FERRITIN, TIBC, IRON, RETICCTPCT in the last 72 hours. Sepsis Labs: No results for input(s): PROCALCITON, LATICACIDVEN in the last 168 hours.  Recent Results (from the past 240 hour(s))  MRSA PCR Screening     Status: None   Collection Time: 07/11/18  9:33 PM  Result Value Ref Range Status   MRSA by PCR NEGATIVE NEGATIVE Final    Comment:        The GeneXpert MRSA Assay (FDA approved for NASAL specimens only), is one component of a comprehensive MRSA colonization surveillance program. It is not intended to diagnose MRSA infection nor to guide or monitor treatment for MRSA infections. Performed at [facility], [place] [address] [place], [place] [address]       Radiology Studies: No results found.    LOS: 9 days   Bonnell Public, MD [facility]  07/21/2018, 1:36 PM

## 2018-07-21 NOTE — Progress Notes (Addendum)
Subjective:  On hd using AVF , pleasantly confused ="at [facility] center, Wednesday." Recognizing me from [facility] .  Objective Vital signs in last 24 hours: Vitals:   07/21/18 0720 07/21/18 0730 07/21/18 0800 07/21/18 0830  BP: (!) 165/85 (!) 154/92 (!) 127/51 (!) 159/117  Pulse: 71 61 73 77  Resp: 18 17 18    Temp:      TempSrc:      SpO2:   98%   Weight:      Height:       Weight change: 0.2 kg  Physical Exam: General: in HD NAD ,Alert and occasionally yelling out  Heart: RRR, no M, R, G Lungs: Anteriorly grossly clear Abdomen: obese,minimal  distended /nontender + BS Extremities: RLE  1+pedal edema - dressing mid shin dry /clean  LLE no significant edema/ skin flakey over shin Dialysis Access:  Left AVF patent on HD   OP Dialysis Orders: MWF -[place] 4.5h   450/800   108kg  2/2.25 bath LUA AVF  Hep 5000    Mircera64mcg q2wks - last 06/07/18 Calcitriol0.67mcg PO qHD   Problem/Plan: 1. Acute encephalopathy -infection vs. HTN. Labs reassuring. RPR negative. CT head on 1/15 w/no acute findings. Improved. Pleasantly confused  and alert today- speech clear 2. Cellulitis of R leg w/R shin ulcer - now on doxy 3. ESRD - On HD MWF.K 4.3/ had Yesterday= IR= Left upper arm fistulogram and PTA of outflow stenosis to 8 mm/ 4. Hypertension/volume -net UF 3 L Monday and Wed - averaging that amount most tmt - lowering EDW gently -EDW down to 102 Monday and 99.2 Wed- LE edema improved  5. Access issue -prolonged bleeding x several treatments- Yesterday IR= Left upper arm fistulogram and PTA of outflow stenosis to 8 mm/  Appreciate IR assistance  6. Anemia of CKD - Hgb 11.5  No indication for ESA  7. Secondary Hyperparathyroidism - Ca/phos in goal. Continue VDRA. Not on binders 8. Nutrition - Renal diet w/fluid restrictions. Vitamins.alb 3.1 - added nepo/ unintentional weight loss 8. Disp - CM/SW working with patient and family - see 1/23  note for details -CLIPPED =  set-up at [facility] in [place], [place]. Patient will start at [facility] on 1/28 at 11:15 am and his tentative schedule is Tuesday, Thursday, Saturday at 12:00 noon.":Gregory Hood, admissions director was advised of sitter and agreed that patient must be sitter free for 24 hours."  Ernest Haber, PA-C [place] [phone] 07/21/2018,9:32 AM  LOS: 9 days   Pt seen, examined and agree w A/P as above.  Kelly Splinter MD [facility] pager [phone]   07/21/2018, 11:18 AM    Labs: Basic Metabolic Panel: Recent Labs  Lab 07/17/18 0807 07/19/18 0730 07/21/18 0751  NA 135 136 135  K 3.9 4.1 4.3  CL 97* 99 99  CO2 25 24 25   GLUCOSE 96 70 120*  BUN 38* 39* 36*  CREATININE 6.34* 5.48* 5.11*  CALCIUM 8.2* 8.4* 8.7*  PHOS 5.1* 4.7* 4.5   Liver Function Tests: Recent Labs  Lab 07/17/18 0807 07/19/18 0730 07/21/18 0751  ALBUMIN 2.9* 3.1* 3.1*   No results for input(s): LIPASE, AMYLASE in the last 168 hours. No results for input(s): AMMONIA in the last 168 hours. CBC: Recent Labs  Lab 07/17/18 0808 07/19/18 0730 07/20/18 0422 07/21/18 0741  WBC 6.9 6.8 6.4 5.7  NEUTROABS  --   --   --  4.4  HGB 12.2* 11.9* 11.9* 11.5*  HCT 38.1*  37.4* 36.6* 37.2*  MCV 86.6 86.6 85.9 86.3  PLT 194 194 176 163   Cardiac Enzymes: No results for input(s): CKTOTAL, CKMB, CKMBINDEX, TROPONINI in the last 168 hours. CBG: Recent Labs  Lab 07/20/18 0746 07/20/18 1148 07/20/18 1739 07/20/18 2054 07/21/18 0648  GLUCAP 123* 120* 115* 167* 113*    Studies/Results: No results found. Medications: . sodium chloride    . sodium chloride Stopped (07/12/18 0318)  . sodium chloride    . sodium chloride     . amLODipine  10 mg Oral QPC supper  . aspirin EC  81 mg Oral Daily  . atropine  1 drop Left Eye QHS  . calcitRIOL  0.25 mcg Oral Daily  . carvedilol  25 mg Oral BID  . Chlorhexidine Gluconate Cloth  6 each Topical Q0600  . clopidogrel  75 mg Oral  Daily  . doxycycline  100 mg Oral Q12H  . ezetimibe  10 mg Oral QPM  . feeding supplement (NEPRO CARB STEADY)  237 mL Oral BID BM  . finasteride  5 mg Oral Daily  . folic acid  2 mg Oral Daily  . insulin aspart  0-5 Units Subcutaneous QHS  . insulin aspart  0-9 Units Subcutaneous TID WC  . polyethylene glycol  17 g Oral BID  . prednisoLONE acetate  1 drop Left Eye QHS  . QUEtiapine  12.5 mg Oral QHS  . senna-docusate  2 tablet Oral Daily  . sodium chloride flush  3 mL Intravenous Q12H

## 2018-07-21 NOTE — Clinical Social Work Note (Addendum)
CSW had several conversations with Gregory Hood, admissions Mudlogger at [facility] and with wife regarding Gregory Hood discharge. Patient will discharge to Gregory Hood, transported by his wife. Gregory Hood was informed of Saturday discharge and will pick-up patient at 2 pm in order to have him at facility before 5 pm, so that he can get his medications.Handoff provided to weekend CSW.  Facility: [facility]              [place] [address] [place], [place] Phone Number - [phone] Fax Number - [phone] *Fax discharge summary to facility on Saturday *Also send discharge summary with patient in packet  Gregory Hood, MSW, [place] Licensed Clinical Social Worker [place] [phone]

## 2018-07-21 NOTE — Progress Notes (Signed)
Pt refused CPAP at this time and stated he would call when he's ready. RT to cont and monitor.

## 2018-07-21 NOTE — Care Management Note (Signed)
Case Management Note Manya Silvas, RN MSN CCM Transitions of Care 36M [place] [phone]  Patient Details  Name: Gregory Hood MRN: [id number] Date of Birth: [birth year]  Subjective/Objective:          R leg celllulitis          Action/Plan: PTA home with [facility] services (RN, Aide[facility] with spouse this afternoon who stated that she is unable to provide 24 hour supervision. States that her son, Gregory Hood, in [place], [place] wants him to be placed in a facility near him, which would involve changing his dialysis center. Gregory Hood has provided CSW with a couple facilities to contact. Will continue to follow for transition of care needs.   Expected Discharge Date:  07/18/18               Expected Discharge Plan:  Skilled Nursing Facility  In-House Referral:  Clinical Social Work  Discharge planning Services  CM Consult  Post Acute Care Choice:  NA Choice offered to:  NA  DME Arranged:  N/A DME Agency:  NA  HH Arranged:  NA HH Agency:  NA  Status of Service:  Completed, signed off  If discussed at [employer] of Stay Meetings, dates discussed:    Additional Comments: 07/21/2018-Anticipate transition to SNF in the morning 07/22/2018 per MD notification. CSW made aware and contacted SNF and pt wife. Bedside RN made aware.   07/21/2018-Anticipate transition to SNF in [place], [place]. Pt to transfer to [facility] in [place], [place] approved by nephrologist in [place]. Pt is unable to transition from SNF until after 3pm. Advised by CSW that SNF will be unable to get meds for patient take in the evening. CM discussed situation with MD. MD stated that it is ok for patient to get 1800 and 2200 meds prior to transition to SNF. Pt will also need sutures removed from fistula site tomorrow per radiology note to remove after 48 hours. Message sent to MD. No other needs identified.   [name], RN 07/21/2018, 1:23 PM

## 2018-07-22 ENCOUNTER — Inpatient Hospital Stay (HOSPITAL_COMMUNITY): Payer: Medicare Other

## 2018-07-22 LAB — GLUCOSE, CAPILLARY
Glucose-Capillary: 106 mg/dL — ABNORMAL HIGH (ref 70–99)
Glucose-Capillary: 157 mg/dL — ABNORMAL HIGH (ref 70–99)

## 2018-07-22 NOTE — Progress Notes (Signed)
Called several times for report for the number provided,was able to connect and talked to receptionist(Joanna) which she transferred me several times but nobody is picking up on the extension number she transferred to.Di Kindle suggested to give report to weekend supervisor,she transferred several times to her,might be office phone? ,but it just keep on ringing.I explained to Di Kindle that I am on the hospital and I gave my direct number ,if any of their nurses wants to have a report.No call from them as of this writing.

## 2018-07-22 NOTE — Progress Notes (Signed)
Patient will DC to: [facility] date: 07/22/2018 Family notified: Yes Transport by: Family   Per MD patient ready for DC to . RN, patient, patient's family, and facility notified of DC. Discharge Summary and FL2 sent to facility. RN to call report prior to discharge [phone]). DC packet on chart. Ambulance transport requested for patient.   CSW will sign off for now as social work intervention is no longer needed. Please consult [place] again if new needs arise.  Nefertari Rebman, LCSW-A [place]/Clinical Social Work Department Cell: [phone]

## 2018-07-22 NOTE — Progress Notes (Addendum)
Subjective:  No cos this am, more alert And appropriate , soft spoken ,noted for dc today  To [place], [place]  Objective Vital signs in last 24 hours: Vitals:   07/21/18 1145 07/21/18 1635 07/21/18 2040 07/22/18 0916  BP: (!) 141/96 (!) 149/85 136/76 (!) 146/78  Pulse: 67 73 76 91  Resp: 17 18 20 18   Temp: 98.9 F (37.2 C) 98.5 F (36.9 C) 97.9 F (36.6 C) (!) 97.5 F (36.4 C)  TempSrc: Oral Oral Oral Oral  SpO2: 99% 98% 95% 99%  Weight: 96.5 kg     Height:       Weight change: 0.1 kg  Physical Exam: General:Alert OX3 this am , calm, NAD Heart: RRR, no M, R, G Lungs: CTA bilat Abdomen: + BS,obese,minimal  distended /nontender  Extremities: RLE  trace pedal edema - dressing mid shin dry /clean LLE no significant edema/ skin flakey over shin Dialysis Access: Left AVF pos bruit   OP Dialysis Orders: MWF -[place] 4.5h   450/800   108kg  2/2.25 bath LUA AVF  Hep 5000    Mircera57mcg q2wks - last 06/07/18 Calcitriol0.20mcg PO qHD   Problem/Plan: 1. Acute encephalopathy -infection vs. HTN. Labs reassuring. RPR negative. CT head on 1/15 w/no acute findings. Improved. calm and  alert this am  speech clear 2. Cellulitis of R leg w/R shin ulcer - now on doxy 3. ESRD - On HD MWF on schedule= IR( 07/20/18)= Left upper arm fistulogram and PTA of outflow stenosis to 8 mm/ 4. Hypertension/volume -net UF 3 L Monday and Wedand yest  With bp stable and  post wt 96.5 - LE edema improved 5. Access issue -prolonged bleeding x several treatments- (07/20/18)  IR= Left upper arm fistulogram and PTA of outflow stenosis to 8 mm/ no further reported bleeding from avf, Appreciate IR assistance  6. Anemia of CKD - Hgb 11.5  No indication for ESA  7. Secondary Hyperparathyroidism - Ca/phos in goal. Continue VDRA. Not on binders 8. Nutrition - Renal diet w/fluid restrictions. Vitamins.alb 3.1- addednepo/ unintentional weight loss 8. Disp - CM/SW working with patient and family -  see 1/23  note for details -CLIPPED = set-up at [facility] in [place], [place]. Patient will start at [facility] on 1/28 at 11:15 am and his tentative schedule is Tuesday, Thursday, Saturday at 12:00 noon.":Josh, admissions director was advised of sitter and agreed that patient must be sitter free for 24 hours."  Ernest Haber, PA-C [place] [phone] 07/22/2018,11:06 AM  LOS: 10 days   Pt seen, examined and agree w A/P as above.  Kelly Splinter MD [employer] pager [phone]   07/22/2018, 1:26 PM    Labs: Basic Metabolic Panel: Recent Labs  Lab 07/17/18 0807 07/19/18 0730 07/21/18 0751  NA 135 136 135  K 3.9 4.1 4.3  CL 97* 99 99  CO2 25 24 25   GLUCOSE 96 70 120*  BUN 38* 39* 36*  CREATININE 6.34* 5.48* 5.11*  CALCIUM 8.2* 8.4* 8.7*  PHOS 5.1* 4.7* 4.5   Liver Function Tests: Recent Labs  Lab 07/17/18 0807 07/19/18 0730 07/21/18 0751  ALBUMIN 2.9* 3.1* 3.1*   No results for input(s): LIPASE, AMYLASE in the last 168 hours. No results for input(s): AMMONIA in the last 168 hours. CBC: Recent Labs  Lab 07/17/18 0808 07/19/18 0730 07/20/18 0422 07/21/18 0741  WBC 6.9 6.8 6.4 5.7  NEUTROABS  --   --   --  4.4  HGB  12.2* 11.9* 11.9* 11.5*  HCT 38.1* 37.4* 36.6* 37.2*  MCV 86.6 86.6 85.9 86.3  PLT 194 194 176 163   Cardiac Enzymes: No results for input(s): CKTOTAL, CKMB, CKMBINDEX, TROPONINI in the last 168 hours. CBG: Recent Labs  Lab 07/20/18 2054 07/21/18 0648 07/21/18 1305 07/21/18 1630 07/22/18 0728  GLUCAP 167* 113* 82 129* 106*    Studies/Results: Ct Head Wo Contrast  Result Date: 07/22/2018 CLINICAL DATA:  Stroke follow-up EXAM: CT HEAD WITHOUT CONTRAST TECHNIQUE: Contiguous axial images were obtained from the base of the skull through the vertex without intravenous contrast. COMPARISON:  07/12/2018 FINDINGS: Brain: Multiple remote small infarcts in the bilateral cerebellum. There is confluent  low-density in the deep cerebral white matter and pons. Remote lacunar infarct in the bilateral thalamus. Small to moderate remote parasagittal left occipital cortex infarct. Atrophy with central and perisylvian predilection. There is disproportionate sulcal crowding at the vertex and some up lifting of the corpus callosum, degree of communicating hydrocephalus not excluded. No evidence of acute infarct, acute hemorrhage, obstructive hydrocephalus, or mass. Vascular: Atherosclerotic calcification.  No hyperdense vessel. 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 IMPRESSION: 1. No acute finding. 2. Advanced chronic ischemic injury. 3. Ventriculomegaly from brain atrophy. Nonspecific finding sometimes seen with communicating hydrocephalus. Electronically Signed   By: Monte Fantasia M.D.   On: 07/22/2018 10:21   Medications: . sodium chloride    . sodium chloride Stopped (07/12/18 0318)   . amLODipine  10 mg Oral QPC supper  . aspirin EC  81 mg Oral Daily  . atropine  1 drop Left Eye QHS  . calcitRIOL  0.25 mcg Oral Daily  . carvedilol  25 mg Oral BID  . Chlorhexidine Gluconate Cloth  6 each Topical Q0600  . clopidogrel  75 mg Oral Daily  . ezetimibe  10 mg Oral QPM  . feeding supplement (NEPRO CARB STEADY)  237 mL Oral BID BM  . finasteride  5 mg Oral Daily  . folic acid  2 mg Oral Daily  . insulin aspart  0-5 Units Subcutaneous QHS  . insulin aspart  0-9 Units Subcutaneous TID WC  . polyethylene glycol  17 g Oral BID  . prednisoLONE acetate  1 drop Left Eye QHS  . QUEtiapine  12.5 mg Oral QHS  . senna-docusate  2 tablet Oral Daily  . sodium chloride flush  3 mL Intravenous Q12H

## 2018-07-22 NOTE — Discharge Summary (Signed)
Physician Discharge Summary  Patient ID: Gregory Hood MRN: [id number] DOB/AGE: 1951/05/13 68 y.o.  Admit date: 07/11/2018 Discharge date: 07/22/2018  Admission Diagnoses:  Discharge Diagnoses:  Principal Problem:   Cellulitis of right leg Active Problems:   DM (diabetes mellitus), type 2 with renal complications (HCC)   Chronic diastolic heart failure (HCC)   End stage renal disease (HCC)   OSA (obstructive sleep apnea)   History of completed stroke   Acute on chronic encephalopathy   Possible chronic baseline encephalopathy.   Hypertensive urgency   Left upper extremity fistulogram and angioplasty for outflow stenosis.   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  Patient lives at home with wife.  Patient presented to [facility] ER for evaluation of confusion and fevers. As per wife, patient was confused for roughly 2 days, went to dialysis on 1/13/07/05/2018, reportedly completed the session but was noted to have fever of 102 degrees Fahrenheit, with some agitation and possible confusion. Patient was noted to have mild drainage from his wound on the right shin.  As per the granddaughter, he has been having memory issues for past 2 to 3 years and for last 1 week has been worsening, making random calls over phone and not being himself.  On presentation to [facility] ER, EKG revealed sinus rhythm, chest x-ray revealed pulmonary vascular congestion but no edema or consolidation, CT head was negative for acute abnormality, routine lab work revealed fairly  stable CBC, influenza PCR was negative, troponin was borderline positive.  Zyvox and Haldol were given at [facility] ED, and patient was transferred to [facility] for further assessment and management.  Patient was admitted and managed with IV antibiotics for the infected right lower extremity wound.  With antibiotics, the wound improved significantly.  Vascular surgery team was consulted to assess for possible peripheral artery disease of the lower extremity, and likelihood of wound healing.  Work-up done by the vascular surgery team revealed likely right tibial artery disease, but it was determined that wound healing close still take place.  Infrainguinal peripheral arterial disease was noted in the left lower extremity, but patient does not have any wound on his left lower extremity.  Patient will need to follow-up with the vascular surgery team in 6 to 8 weeks for further assessment and management.  Nephrology team was consulted to assist with hemodialysis management during the hospital stay.  During hemodialysis, increased bleeding was noted from the AV fistula and patient underwent fistulogram and angioplasty.  Repeat CT scan of the head, stroke protocol done prior to discharge did not reveal any acute findings.  Patient continued to have intermittent episodes of worsening confusion during the hospital stay, which could be typical for vascular dementia.  Patient will need to follow-up with a primary care provider and neurology team to assess dementing process further.  Patient will be discharged to skilled nursing facility.  Patient is stable for discharge.    Cellulitis of right leg with right shin ulcer w some draiange:  Overall improved.   Complete course of oral doxycycline.   Allow with the vascular surgery team on discharge.  Acute encephalopathy, likely combined toxic and metabolic in the setting of likely chronic dementing process: - Family notes that he has memory issues and has been  acting unusual for last 1 week. - Repeat CT headisunremarkable for acute finding (patient has pacemaker, therefore, MRI was not pursued) -Patient continued to have intermittent episodes of worsening confusion during the hospital stay. - Ammonia, TSH, B12 ,RPR, HIV were all normal/negative.  -Continue low-dose Seroquel at nighttime.   -Follow with PCP and neurology team on discharge for further assessment.    Hypertensive urgency:  BP is currently stable.   Continue to optimize.  Diabetes mellitus type 2, with renal complications:  Controlled. Continue to optimize.   Chronic diastolic heart failure: Compensated.  End stage renal disease on HD MWF: Continue hemodialysis.  Left upper extremity graft stenosis: Status post fistulogram and angioplasty. Continue to monitor.  History of stroke: -CT head was negative for acute process.   -Continue aspirin, Plavix and Zetia.   Consults: nephrology, vascular surgery and Interventional radiology  Significant Diagnostic Studies:  CT head Fistulogram Ammonia, B12, TSH, HIV.   Discharge Exam: Blood pressure (!) 146/78, pulse 91, temperature (!) 97.5 F (36.4 C), temperature source Oral, resp. rate 18, height 5\' 9"  (1.753 m), weight 96.5 kg, SpO2 99 %.   Disposition: Discharge disposition: 03-Skilled Nursing Facility   Discharge Instructions    Call MD for:  persistant nausea and vomiting   Complete by:  As directed    Call MD for:  severe uncontrolled pain   Complete by:  As directed    Call MD for:  temperature >100.4   Complete by:  As directed    Diet - low sodium heart healthy   Complete by:  As directed    Diabetic and renal diet   Diet - low sodium heart healthy   Complete by:  As directed    Discharge instructions   Complete by:  As directed    Please call call MD or return to ER for similar recurring problem, nausea/vomiting, uncontrolled pain, abdominal pain chest pain, shortness of breath, fever. Please  follow-up your doctor as instructed in few days. FOLLOW UP WITH vascular doctor Dr. Scot Dock in 6 to 8 weeks   Increase activity slowly   Complete by:  As directed    Increase activity slowly   Complete by:  As directed      Allergies as of 07/22/2018      Reactions   Bee Venom Swelling   Lexiscan [regadenoson] Other (See Comments)   Seizure like-activity after lexiscan given.   Ace Inhibitors Cough      Medication List    STOP taking these medications   furosemide 80 MG tablet Commonly known as:  LASIX   hydrALAZINE 25 MG tablet Commonly known as:  APRESOLINE     TAKE these medications   acetaminophen 500 MG tablet Commonly known as:  TYLENOL Take 500 mg by mouth every 6 (six) hours as needed for mild pain or moderate pain.   amLODipine 10 MG tablet Commonly known as:  NORVASC Take 1 tablet (10 mg total) by mouth daily.   aspirin 81 MG tablet Take 1 tablet (81 mg total) by mouth daily.   atropine 1 % ophthalmic solution Place 1 drop into the left eye daily.   calcitRIOL 0.25 MCG capsule Commonly known as:  ROCALTROL Take 1 capsule (0.25 mcg total) by mouth daily.   carvedilol 25 MG tablet Commonly known as:  COREG Take 25 mg  by mouth 2 (two) times daily.   Cholecalciferol 50 MCG (2000 UT) Tabs Take 1 tablet (2,000 Units total) by mouth daily.   clopidogrel 75 MG tablet Commonly known as:  PLAVIX Take 75 mg by mouth daily.   doxycycline 100 MG tablet Commonly known as:  VIBRA-TABS Take 1 tablet (100 mg total) by mouth every 12 (twelve) hours for 5 days.   finasteride 5 MG tablet Commonly known as:  PROSCAR Take 5 mg by mouth daily.   folic acid 1 MG tablet Commonly known as:  FOLVITE Take 2 tablets (2 mg total) by mouth daily.   ONETOUCH VERIO test strip Generic drug:  glucose blood   polyethylene glycol packet Commonly known as:  MIRALAX / GLYCOLAX Take 17 g by mouth 2 (two) times daily. What changed:    when to take this  reasons to take  this   prednisoLONE acetate 1 % ophthalmic suspension Commonly known as:  PRED FORTE Place 1 drop into the left eye at bedtime.   QUEtiapine 25 MG tablet Commonly known as:  SEROQUEL Take 0.5 tablets (12.5 mg total) by mouth at bedtime.   Red Yeast Rice 600 MG Caps Take 1 capsule by mouth daily.   senna-docusate 8.6-50 MG tablet Commonly known as:  Senokot-S Take 2 tablets by mouth daily. What changed:    when to take this  reasons to take this      [place] Follow up.   Specialty:  [place] Why:  PT, OT, RN, Aide Contact information: [address] [place] [address] [phone]          Time spent: 32 minutes.  SignedBonnell Public 07/22/2018, 11:11 AM

## 2018-07-24 LAB — GLUCOSE, CAPILLARY: Glucose-Capillary: 186 mg/dL — ABNORMAL HIGH (ref 70–99)

## 2018-07-31 ENCOUNTER — Encounter (HOSPITAL_COMMUNITY): Payer: Self-pay | Admitting: Interventional Radiology

## 2019-01-19 ENCOUNTER — Encounter (HOSPITAL_COMMUNITY): Payer: Self-pay | Admitting: Emergency Medicine

## 2019-01-19 ENCOUNTER — Emergency Department (HOSPITAL_COMMUNITY)
Admission: EM | Admit: 2019-01-19 | Discharge: 2019-01-20 | Disposition: A | Discharge status: Home / Self Care | Payer: Medicare Other | Attending: Emergency Medicine | Admitting: Emergency Medicine

## 2019-01-19 ENCOUNTER — Other Ambulatory Visit: Payer: Self-pay

## 2019-01-19 DIAGNOSIS — N189 Chronic kidney disease, unspecified: Secondary | ICD-10-CM | POA: Insufficient documentation

## 2019-01-19 DIAGNOSIS — Y69 Unspecified misadventure during surgical and medical care: Secondary | ICD-10-CM | POA: Insufficient documentation

## 2019-01-19 DIAGNOSIS — E119 Type 2 diabetes mellitus without complications: Secondary | ICD-10-CM | POA: Insufficient documentation

## 2019-01-19 DIAGNOSIS — T82838A Hemorrhage of vascular prosthetic devices, implants and grafts, initial encounter: Secondary | ICD-10-CM | POA: Diagnosis not present

## 2019-01-19 DIAGNOSIS — Z87891 Personal history of nicotine dependence: Secondary | ICD-10-CM | POA: Diagnosis not present

## 2019-01-19 DIAGNOSIS — Z7901 Long term (current) use of anticoagulants: Secondary | ICD-10-CM | POA: Insufficient documentation

## 2019-01-19 DIAGNOSIS — Z7982 Long term (current) use of aspirin: Secondary | ICD-10-CM | POA: Insufficient documentation

## 2019-01-19 DIAGNOSIS — Z8673 Personal history of transient ischemic attack (TIA), and cerebral infarction without residual deficits: Secondary | ICD-10-CM | POA: Diagnosis not present

## 2019-01-19 DIAGNOSIS — J449 Chronic obstructive pulmonary disease, unspecified: Secondary | ICD-10-CM | POA: Diagnosis not present

## 2019-01-19 DIAGNOSIS — Z79899 Other long term (current) drug therapy: Secondary | ICD-10-CM | POA: Insufficient documentation

## 2019-01-19 DIAGNOSIS — I129 Hypertensive chronic kidney disease with stage 1 through stage 4 chronic kidney disease, or unspecified chronic kidney disease: Secondary | ICD-10-CM | POA: Diagnosis not present

## 2019-01-19 DIAGNOSIS — Z452 Encounter for adjustment and management of vascular access device: Secondary | ICD-10-CM | POA: Diagnosis present

## 2019-01-19 LAB — COMPREHENSIVE METABOLIC PANEL
ALT: 20 U/L (ref 0–44)
AST: 28 U/L (ref 15–41)
Albumin: 3.8 g/dL (ref 3.5–5.0)
Alkaline Phosphatase: 110 U/L (ref 38–126)
Anion gap: 11 (ref 5–15)
BUN: 12 mg/dL (ref 8–23)
CO2: 31 mmol/L (ref 22–32)
Calcium: 9.1 mg/dL (ref 8.9–10.3)
Chloride: 94 mmol/L — ABNORMAL LOW (ref 98–111)
Creatinine, Ser: 3.64 mg/dL — ABNORMAL HIGH (ref 0.61–1.24)
GFR calc Af Amer: 19 mL/min — ABNORMAL LOW (ref >60)
GFR calc non Af Amer: 16 mL/min — ABNORMAL LOW (ref >60)
Glucose, Bld: 175 mg/dL — ABNORMAL HIGH (ref 70–99)
Potassium: 3.4 mmol/L — ABNORMAL LOW (ref 3.5–5.1)
Sodium: 136 mmol/L (ref 135–145)
Total Bilirubin: 1.1 mg/dL (ref 0.3–1.2)
Total Protein: 7.5 g/dL (ref 6.5–8.1)

## 2019-01-19 LAB — CBC WITH DIFFERENTIAL/PLATELET
Abs Immature Granulocytes: 0.01 10*3/uL (ref 0.00–0.07)
Basophils Absolute: 0 10*3/uL (ref 0.0–0.1)
Basophils Relative: 1 %
Eosinophils Absolute: 0.3 10*3/uL (ref 0.0–0.5)
Eosinophils Relative: 6 %
HCT: 36 % — ABNORMAL LOW (ref 39.0–52.0)
Hemoglobin: 11.6 g/dL — ABNORMAL LOW (ref 13.0–17.0)
Immature Granulocytes: 0 %
Lymphocytes Relative: 14 %
Lymphs Abs: 0.7 10*3/uL (ref 0.7–4.0)
MCH: 29.7 pg (ref 26.0–34.0)
MCHC: 32.2 g/dL (ref 30.0–36.0)
MCV: 92.3 fL (ref 80.0–100.0)
Monocytes Absolute: 0.6 10*3/uL (ref 0.1–1.0)
Monocytes Relative: 12 %
Neutro Abs: 3.5 10*3/uL (ref 1.7–7.7)
Neutrophils Relative %: 67 %
Platelets: 153 10*3/uL (ref 150–400)
RBC: 3.9 MIL/uL — ABNORMAL LOW (ref 4.22–5.81)
RDW: 13.8 % (ref 11.5–15.5)
WBC: 5.2 10*3/uL (ref 4.0–10.5)
nRBC: 0 % (ref 0.0–0.2)

## 2019-01-19 NOTE — ED Triage Notes (Signed)
Patient from [facility] in [place].  Patient returned from dialysis and bled thru two dressing changes.  Patient continuing to bleed at this time.  Bleeding is controlled.

## 2019-01-20 ENCOUNTER — Other Ambulatory Visit: Payer: Self-pay

## 2019-01-20 NOTE — ED Notes (Signed)
ED Provider at bedside. 

## 2019-01-20 NOTE — Discharge Instructions (Signed)
Keep your arm wrapped into the go to the dialysis center.  Please return for worsening bleeding.

## 2019-01-20 NOTE — ED Provider Notes (Signed)
[facility] EMERGENCY DEPARTMENT Provider Note   CSN: [id number] Arrival date & time: 01/19/19  2052    History   Chief Complaint Chief Complaint  Patient presents with  . Vascular Access Problem    HPI Gregory Hood is a 68 y.o. male.     68 yo M with a chief complaint of bleeding from his left AV fistula.  Patient got dialysis is normal today and then he noticed that there is blood coming from the site when he got home.  He went to the nursing section of his nursing home and they wrapped his wound.  Upon arrival here there is no continued bleeding.  He denies lightheaded dizziness feeling that he may pass out.  The history is provided by the patient.  Illness Severity:  Mild Onset quality:  Sudden Duration:  6 hours Timing:  Constant Progression:  Unchanged Chronicity:  New Associated symptoms: no abdominal pain, no chest pain, no congestion, no diarrhea, no fever, no headaches, no myalgias, no rash, no shortness of breath and no vomiting     Past Medical History:  Diagnosis Date  . Chronic kidney disease   . COPD (chronic obstructive pulmonary disease) ([place])   . Diabetes mellitus without complication ([place])   . Diabetic retinopathy ([place])   . Hyperlipidemia   . Hypertension   . Obesity   . Orthostatic hypotension   . OSA on CPAP   . Retinal detachment   . Seizures ([place])   . Stroke ([place])   . TIA (transient ischemic attack)   . Umbilical hernia     Patient Active Problem List   Diagnosis Date Noted  . History of completed stroke 07/11/2018  . Acute encephalopathy 07/11/2018  . Cellulitis of right leg 07/11/2018  . Hypertensive urgency 07/11/2018  . Acute exacerbation of CHF (congestive heart failure) ([place]) 03/22/2018  . CVA (cerebral vascular accident) ([place]) 03/22/2018  . OSA (obstructive sleep apnea) 03/22/2018  . BPH (benign prostatic hyperplasia) 03/22/2018  . Thrombocytopenia ([place]) 03/22/2018  . Normocytic anemia 03/22/2018  .  Metabolic acidosis 80/99/8338  . Uremia 03/22/2018  . End stage renal disease ([place]) 03/14/2014  . Orthostatic hypotension 02/22/2014  . Elevated troponin 02/22/2014  . Chronic diastolic heart failure ([place]) 02/22/2014  . Elevated lipids 02/22/2014  . Essential hypertension, benign 02/17/2014  . DM (diabetes mellitus), type 2 with renal complications ([place]) 25/10/3974  . Abnormal ECG 02/17/2014    Past Surgical History:  Procedure Laterality Date  . BASCILIC VEIN TRANSPOSITION Left 03/06/2015   Procedure: LET BASCILIC VEIN TRANSPOSITION;  Surgeon: Angelia Mould, MD;  Location: [facility];  Service: Vascular;  Laterality: Left;  . CIRCUMCISION    . EYE SURGERY Bilateral    had surgery several different times.  Marland Kitchen HAMMER TOE SURGERY Left 30 years ago   small toe left toe  . HERNIA REPAIR    . IR AV DIALY SHUNT INTRO NEEDLE/INTRACATH INITIAL W/PTA/IMG LEFT  07/20/2018  . SEPTOPLASTY  20 years ago        Home Medications    Prior to Admission medications   Medication Sig Start Date End Date Taking? Authorizing Provider  acetaminophen (TYLENOL) 500 MG tablet Take 500 mg by mouth every 6 (six) hours as needed for mild pain or moderate pain.    [provider]  amLODipine (NORVASC) 10 MG tablet Take 1 tablet (10 mg total) by mouth daily. 03/31/18   Hongalgi, Lenis Dickinson, MD  aspirin 81 MG tablet Take 1  tablet (81 mg total) by mouth daily. Patient not taking: Reported on 07/12/2018 03/30/18   Modena Jansky, MD  atropine 1 % ophthalmic solution Place 1 drop into the left eye daily.  06/30/18 06/30/19  [provider]  calcitRIOL (ROCALTROL) 0.25 MCG capsule Take 1 capsule (0.25 mcg total) by mouth daily. Patient not taking: Reported on 07/12/2018 03/30/18   Modena Jansky, MD  carvedilol (COREG) 25 MG tablet Take 25 mg by mouth 2 (two) times daily. 01/31/18 01/31/19  [provider]  cholecalciferol 2000 units TABS Take 1 tablet (2,000 Units total) by mouth daily. 03/31/18    Hongalgi, Lenis Dickinson, MD  clopidogrel (PLAVIX) 75 MG tablet Take 75 mg by mouth daily. 03/10/18   [provider]  folic acid (FOLVITE) 1 MG tablet Take 2 tablets (2 mg total) by mouth daily. 04/03/14   Josue Hector, MD  ONETOUCH VERIO test strip  01/08/14   [provider]  polyethylene glycol (MIRALAX / GLYCOLAX) packet Take 17 g by mouth 2 (two) times daily. Patient taking differently: Take 17 g by mouth 2 (two) times daily as needed for mild constipation.  03/30/18   Hongalgi, Lenis Dickinson, MD  prednisoLONE acetate (PRED FORTE) 1 % ophthalmic suspension Place 1 drop into the left eye at bedtime. 07/18/18   Antonieta Pert, MD  QUEtiapine (SEROQUEL) 25 MG tablet Take 0.5 tablets (12.5 mg total) by mouth at bedtime. 07/18/18   Antonieta Pert, MD  Red Yeast Rice 600 MG CAPS Take 1 capsule by mouth daily.    [provider]  senna-docusate (SENOKOT-S) 8.6-50 MG tablet Take 2 tablets by mouth daily. 07/19/18   Antonieta Pert, MD    Family History Family History  Problem Relation Age of Onset  . Hypertension Mother   . Diabetes type II Mother   . Transient ischemic attack Mother   . Kidney disease Mother   . Diabetes Mother   . Varicose Veins Mother   . Hypertension Father   . Diabetes type II Father   . Diabetes Father   . Diabetes type II Sister   . Hypertension Sister   . Diabetes Sister   . Learning disabilities Maternal Uncle     Social History Social History   Tobacco Use  . Smoking status: Former Smoker    Types: Cigarettes    Quit date: 03/05/1974    Years since quitting: 44.9  . Smokeless tobacco: Never Used  Substance Use Topics  . Alcohol use: No    Alcohol/week: 0.0 standard drinks  . Drug use: No     Allergies   Bee venom, Lexiscan [regadenoson], and Ace inhibitors   Review of Systems Review of Systems  Constitutional: Negative for chills and fever.  HENT: Negative for congestion and facial swelling.   Eyes: Negative for discharge and visual  disturbance.  Respiratory: Negative for shortness of breath.   Cardiovascular: Negative for chest pain and palpitations.  Gastrointestinal: Negative for abdominal pain, diarrhea and vomiting.  Musculoskeletal: Negative for arthralgias and myalgias.  Skin: Negative for color change and rash.  Neurological: Negative for tremors, syncope and headaches.  Psychiatric/Behavioral: Negative for confusion and dysphoric mood.     Physical Exam Updated Vital Signs BP (!) 172/92   Pulse 81   Temp 98.2 F (36.8 C) (Oral)   Resp 17   Ht 5\' 8"  (1.727 m)   Wt 90.7 kg   SpO2 100%   BMI 30.41 kg/m   Physical Exam Vitals signs and  nursing note reviewed.  Constitutional:      Appearance: He is well-developed.  HENT:     Head: Normocephalic and atraumatic.  Eyes:     Pupils: Pupils are equal, round, and reactive to light.  Neck:     Musculoskeletal: Normal range of motion and neck supple.     Vascular: No JVD.  Cardiovascular:     Rate and Rhythm: Normal rate and regular rhythm.     Heart sounds: No murmur. No friction rub. No gallop.   Pulmonary:     Effort: No respiratory distress.     Breath sounds: No wheezing.  Abdominal:     General: There is no distension.     Tenderness: There is no guarding or rebound.  Musculoskeletal: Normal range of motion.     Comments: Palpable thrill to the left AV fistula.  There is very trace oozing at the puncture wound at the inferior portion of the fistula site.  No other bleeding is noted.  Skin:    Coloration: Skin is not pale.     Findings: No rash.  Neurological:     Mental Status: He is alert and oriented to person, place, and time.  Psychiatric:        Behavior: Behavior normal.      ED Treatments / Results  Labs (all labs ordered are listed, but only abnormal results are displayed) Labs Reviewed  CBC WITH DIFFERENTIAL/PLATELET - Abnormal; Notable for the following components:      Result Value   RBC 3.90 (*)    Hemoglobin 11.6 (*)     HCT 36.0 (*)    All other components within normal limits  COMPREHENSIVE METABOLIC PANEL - Abnormal; Notable for the following components:   Potassium 3.4 (*)    Chloride 94 (*)    Glucose, Bld 175 (*)    Creatinine, Ser 3.64 (*)    GFR calc non Af Amer 16 (*)    GFR calc Af Amer 19 (*)    All other components within normal limits    EKG None  Radiology No results found.  Procedures Procedures (including critical care time)  Medications Ordered in ED Medications - No data to display   Initial Impression / Assessment and Plan / ED Course  I have reviewed the triage vital signs and the nursing notes.  Pertinent labs & imaging results that were available during my care of the patient were reviewed by me and considered in my medical decision making (see chart for details).        68 yo M with a chief complaints of bleeding from his AV fistula.  Patient had a pressure dressing placed at his nursing facility.  This seems to have stopped the bleeding.  He has very minimal if any oozing currently.  We will rewrap his wound.  Have him follow-up in the dialysis center.  5:25 AM:  I have discussed the diagnosis/risks/treatment options with the patient and believe the pt to be eligible for discharge home to follow-up with PCP, dialysis. We also discussed returning to the ED immediately if new or worsening sx occur. We discussed the sx which are most concerning (e.g., sudden worsening bleeding, feeling lightheaded, dizzy) that necessitate immediate return. Medications administered to the patient during their visit and any new prescriptions provided to the patient are listed below.  Medications given during this visit Medications - No data to display   The patient appears reasonably screen and/or stabilized for discharge and I doubt any  other medical condition or other [facility] requiring further screening, evaluation, or treatment in the ED at this time prior to discharge.    Final  Clinical Impressions(s) / ED Diagnoses   Final diagnoses:  Bleeding from dialysis shunt, initial encounter [facility])    ED Discharge Orders    None       Deno Etienne, DO 01/20/19 [id number]

## 2019-01-20 NOTE — ED Notes (Signed)
No bleeding present at this time

## 2019-01-20 NOTE — ED Notes (Signed)
Discharge instructions and follow up care discussed with pt. Pt verbalized understanding. Pt to go home via [facility]

## 2019-01-20 NOTE — ED Notes (Signed)
Secretary to call [facility] for pt. Transport

## 2019-03-23 ENCOUNTER — Encounter (HOSPITAL_COMMUNITY): Payer: Self-pay | Admitting: Pharmacy Technician

## 2019-03-23 ENCOUNTER — Other Ambulatory Visit: Payer: Self-pay

## 2019-03-23 ENCOUNTER — Emergency Department (HOSPITAL_COMMUNITY)
Admission: EM | Admit: 2019-03-23 | Discharge: 2019-03-24 | Disposition: A | Discharge status: Home / Self Care | Payer: Medicare Other | Attending: Emergency Medicine | Admitting: Emergency Medicine

## 2019-03-23 DIAGNOSIS — J449 Chronic obstructive pulmonary disease, unspecified: Secondary | ICD-10-CM | POA: Insufficient documentation

## 2019-03-23 DIAGNOSIS — T829XXA Unspecified complication of cardiac and vascular prosthetic device, implant and graft, initial encounter: Secondary | ICD-10-CM | POA: Diagnosis not present

## 2019-03-23 DIAGNOSIS — Z87891 Personal history of nicotine dependence: Secondary | ICD-10-CM | POA: Insufficient documentation

## 2019-03-23 DIAGNOSIS — T82838A Hemorrhage of vascular prosthetic devices, implants and grafts, initial encounter: Secondary | ICD-10-CM

## 2019-03-23 DIAGNOSIS — N186 End stage renal disease: Secondary | ICD-10-CM

## 2019-03-23 DIAGNOSIS — I120 Hypertensive chronic kidney disease with stage 5 chronic kidney disease or end stage renal disease: Secondary | ICD-10-CM | POA: Insufficient documentation

## 2019-03-23 DIAGNOSIS — Y712 Prosthetic and other implants, materials and accessory cardiovascular devices associated with adverse incidents: Secondary | ICD-10-CM | POA: Insufficient documentation

## 2019-03-23 DIAGNOSIS — E1122 Type 2 diabetes mellitus with diabetic chronic kidney disease: Secondary | ICD-10-CM | POA: Insufficient documentation

## 2019-03-23 DIAGNOSIS — Z992 Dependence on renal dialysis: Secondary | ICD-10-CM | POA: Insufficient documentation

## 2019-03-23 DIAGNOSIS — R58 Hemorrhage, not elsewhere classified: Secondary | ICD-10-CM | POA: Diagnosis not present

## 2019-03-23 NOTE — ED Provider Notes (Signed)
[facility] Emergency Department Provider Note MRN:  [id number]  Arrival date & time: 03/23/19     Chief Complaint   Vascular Access Problem   History of Present Illness   Gregory Hood is a 68 y.o. year-old male with a history of diabetes, COPD, ESRD presenting to the ED with chief complaint of vascular access problem.  Patient received full dialysis session today, went home without issue.  After returning home, he noted some bleeding from his AV fistula.  Was wrapped by EMS.  Denies any other complaints, no pain, no fever.  Review of Systems  A complete 10 system review of systems was obtained and all systems are negative except as noted in the HPI and PMH.   Patient's Health History    Past Medical History:  Diagnosis Date  . Chronic kidney disease   . COPD (chronic obstructive pulmonary disease) ([place])   . Diabetes mellitus without complication ([place])   . Diabetic retinopathy ([place])   . Hyperlipidemia   . Hypertension   . Obesity   . Orthostatic hypotension   . OSA on CPAP   . Retinal detachment   . Seizures ([place])   . Stroke ([place])   . TIA (transient ischemic attack)   . Umbilical hernia     Past Surgical History:  Procedure Laterality Date  . BASCILIC VEIN TRANSPOSITION Left 03/06/2015   Procedure: LET BASCILIC VEIN TRANSPOSITION;  Surgeon: Angelia Mould, MD;  Location: [place];  Service: Vascular;  Laterality: Left;  . CIRCUMCISION    . EYE SURGERY Bilateral    had surgery several different times.  Marland Kitchen HAMMER TOE SURGERY Left 30 years ago   small toe left toe  . HERNIA REPAIR    . IR AV DIALY SHUNT INTRO NEEDLE/INTRACATH INITIAL W/PTA/IMG LEFT  07/20/2018  . SEPTOPLASTY  20 years ago    Family History  Problem Relation Age of Onset  . Hypertension Mother   . Diabetes type II Mother   . Transient ischemic attack Mother   . Kidney disease Mother   . Diabetes Mother   . Varicose Veins Mother   . Hypertension Father   . Diabetes type  II Father   . Diabetes Father   . Diabetes type II Sister   . Hypertension Sister   . Diabetes Sister   . Learning disabilities Maternal Uncle     Social History   Socioeconomic History  . Marital status: Married    Spouse name: Not on file  . Number of children: Not on file  . Years of education: Not on file  . Highest education level: Not on file  Occupational History  . Not on file  Social Needs  . Financial resource strain: Not on file  . Food insecurity    Worry: Not on file    Inability: Not on file  . Transportation needs    Medical: Not on file    Non-medical: Not on file  Tobacco Use  . Smoking status: Former Smoker    Types: Cigarettes    Quit date: 03/05/1974    Years since quitting: 45.0  . Smokeless tobacco: Never Used  Substance and Sexual Activity  . Alcohol use: No    Alcohol/week: 0.0 standard drinks  . Drug use: No  . Sexual activity: Not Currently  Lifestyle  . Physical activity    Days per week: Not on file    Minutes per session: Not on file  . Stress: Not on file  Relationships  . Social Herbalist on phone: Not on file    Gets together: Not on file    Attends religious service: Not on file    Active member of club or organization: Not on file    Attends meetings of clubs or organizations: Not on file    Relationship status: Not on file  . Intimate partner violence    Fear of current or ex partner: Not on file    Emotionally abused: Not on file    Physically abused: Not on file    Forced sexual activity: Not on file  Other Topics Concern  . Not on file  Social History Narrative  . Not on file     Physical Exam  Vital Signs and Nursing Notes reviewed Vitals:   03/23/19 1813  BP: (!) 149/69  Pulse: 73  Resp: 13  Temp: 98 F (36.7 C)  SpO2: 100%    CONSTITUTIONAL: Well-appearing, NAD NEURO:  Alert and oriented x 3, no focal deficits EYES:  eyes equal and reactive ENT/NECK:  no LAD, no JVD CARDIO: Regular rate,  well-perfused, normal S1 and S2 PULM:  CTAB no wheezing or rhonchi GI/GU:  normal bowel sounds, non-distended, non-tender MSK/SPINE:  No gross deformities, no edema SKIN:  no rash, atraumatic; small pulsatile bleeding from left arm AV fistula PSYCH:  Appropriate speech and behavior  Diagnostic and Interventional Summary    Labs Reviewed - No data to display  No orders to display    Medications - No data to display   Procedures Critical Care  ED Course and Medical Decision Making  I have reviewed the triage vital signs and the nursing notes.  Pertinent labs & imaging results that were available during my care of the patient were reviewed by me and considered in my medical decision making (see below for details).  Small pulsatile AV fistula bleed, was pressure dressed by EMS with dressing in place for 30 minutes prior to my personal takedown of the dressing and reevaluation.  Still bleeding.  New pressure dressing applied.  Will consult vascular surgery for recommendations.  Dr. Donnetta Hutching of vascular surgery was kind enough to come see the patient in person and to repair the bleeding site with a single suture.  He is now hemostatic and appropriate for discharge.  Barth Kirks. [place] Small, [place] [email or web address]  Final Clinical Impressions(s) / ED Diagnoses     ICD-10-CM   1. ESRD on dialysis ([place])  N18.6    Z99.2   2. Complication of AV dialysis fistula, initial encounter  T82.9XXA   3. Bleeding  R58     ED Discharge Orders    None      Discharge Instructions Discussed with and Provided to Patient:   Discharge Instructions     You were evaluated in the Emergency Department and after careful evaluation, we did not find any emergent condition requiring admission or further testing in the hospital.  Your fistula was evaluated and repaired by the vascular surgeons.  Please follow-up with your regular doctors and continue your  normal dialysis schedule.  Please return to the Emergency Department if you experience any worsening of your condition.  We encourage you to follow up with a primary care provider.  Thank you for allowing [place] to be a part of your care.       Maudie Flakes, MD 03/23/19 2106

## 2019-03-23 NOTE — Discharge Instructions (Addendum)
You were evaluated in the Emergency Department and after careful evaluation, we did not find any emergent condition requiring admission or further testing in the hospital.  Your fistula was evaluated and repaired by the vascular surgeons.  Please follow-up with your regular doctors and continue your normal dialysis schedule.  Please return to the Emergency Department if you experience any worsening of your condition.  We encourage you to follow up with a primary care provider.  Thank you for allowing [place] to be a part of your care.

## 2019-03-23 NOTE — ED Triage Notes (Signed)
Pt arrives via [place] ems with bleeding from his dialysis site. Pt reports continued bleeding since 1700 today. Blood noted to bandage. Pt in NAD.  140/84 HR 70 98% RA

## 2019-03-23 NOTE — Progress Notes (Signed)
Patient ID: LAZERRICK SPARTA, male   DOB: 26-Sep-1950, 68 y.o.   MRN: [id number]                                     Patient name: Gregory Hood MRN: [id number] DOB: 07/27/1950 Sex: male  REASON FOR VISIT: Patient presents to the emergency room with persistent bleeding from his left upper arm AV fistula  HPI: Gregory Hood is a 68 y.o. male seen in the emergency room for persistent bleeding in his left upper arm AV fistula.  He has a basilic vein fistula was initially placed by Dr. Scot Dock in 2016.  He reports that he has been on hemodialysis for approximately 1 year.  He has had no interventions since initiating dialysis.  He did have one prior episode of bleeding treated in the emergency department he had successful hemodialysis today.  After he got home he began bleeding and was brought to the emergency room by EMS.  He actually is living in a skilled nursing facility.  His wife is here and is giving some of his history as well.    No current facility-administered medications for this encounter.    Current Outpatient Medications  Medication Sig Dispense Refill  . acetaminophen (TYLENOL) 500 MG tablet Take 500 mg by mouth every 6 (six) hours as needed for mild pain or moderate pain.    Marland Kitchen amLODipine (NORVASC) 10 MG tablet Take 1 tablet (10 mg total) by mouth daily.    Marland Kitchen aspirin 81 MG tablet Take 1 tablet (81 mg total) by mouth daily. (Patient not taking: Reported on 07/12/2018)    . atropine 1 % ophthalmic solution Place 1 drop into the left eye daily.     . calcitRIOL (ROCALTROL) 0.25 MCG capsule Take 1 capsule (0.25 mcg total) by mouth daily. (Patient not taking: Reported on 07/12/2018)    . cholecalciferol 2000 units TABS Take 1 tablet (2,000 Units total) by mouth daily.    . clopidogrel (PLAVIX) 75 MG tablet Take 75 mg by mouth daily.  0  . folic acid (FOLVITE) 1 MG tablet Take 2 tablets (2 mg total) by mouth daily. 180 tablet 3  . ONETOUCH VERIO test strip     . polyethylene glycol (MIRALAX /  GLYCOLAX) packet Take 17 g by mouth 2 (two) times daily. (Patient taking differently: Take 17 g by mouth 2 (two) times daily as needed for mild constipation. )    . prednisoLONE acetate (PRED FORTE) 1 % ophthalmic suspension Place 1 drop into the left eye at bedtime. 5 mL 0  . QUEtiapine (SEROQUEL) 25 MG tablet Take 0.5 tablets (12.5 mg total) by mouth at bedtime.    . Red Yeast Rice 600 MG CAPS Take 1 capsule by mouth daily.    Marland Kitchen senna-docusate (SENOKOT-S) 8.6-50 MG tablet Take 2 tablets by mouth daily.       PHYSICAL EXAM: Vitals:   03/23/19 1813  BP: (!) 149/69  Pulse: 73  Resp: 13  Temp: 98 F (36.7 C)  TempSrc: Oral  SpO2: 100%    GENERAL: The patient is a well-nourished male, in no acute distress. The vital signs are documented above. He has a persistent bleeding with ABD and Covan wrap on his left upper arm.  This was removed and he had a single puncture site that had persistent arterial bleeding from this.  The skin itself around the bleeding area  and the rest of his fistula was intact.  MEDICAL ISSUES: Bleeding following hemodialysis access today.  The patient is not on any anticoagulation.  He denies taking Plavix which has been recorded in the past.  I suspect that he probably is taking Plavix but does not know it.  The arm was prepped and draped in usual sterile fashion with Betadine.  I placed a 3-0 nylon pursestring suture around the bleeding site and tied it secure enough to stop the bleeding but not to the necrosis the skin.  This completely stop the bleeding.  He continued to have excellent thrill in his fistula.  A 4 x 4 and Coban was replaced and the patient was comfortable.  Discussed this with Dr.Bero will arrange discharge to home.  We will see him in the office in 1 week for suture removal.  He has hemodialysis on Monday Wednesday and Friday therefore we will see him in the office on a Tuesday or Thursday   Rosetta Posner, MD FACS [facility] Tel [phone] Pager [phone]

## 2019-03-24 NOTE — ED Notes (Signed)
RN contacted [facility] for ETA, Per [facility] no order was placed. Order is now in for ASAP

## 2019-04-03 ENCOUNTER — Encounter: Payer: Self-pay | Admitting: Physician Assistant

## 2019-04-03 ENCOUNTER — Other Ambulatory Visit: Payer: Self-pay

## 2019-04-03 ENCOUNTER — Ambulatory Visit (INDEPENDENT_AMBULATORY_CARE_PROVIDER_SITE_OTHER): Payer: Medicare Other | Admitting: Physician Assistant

## 2019-04-03 VITALS — BP 129/69 | HR 74 | Temp 97.8°F | Resp 20 | Ht 68.0 in | Wt 200.0 lb

## 2019-04-03 DIAGNOSIS — N186 End stage renal disease: Secondary | ICD-10-CM

## 2019-04-03 NOTE — Progress Notes (Signed)
Established Dialysis Access   History of Present Illness   Gregory Hood is a 68 y.o. (1950-10-01) male who presents for re-evaluation of left arm basilic vein fistula.  This was initially treated by Dr. Scot Dock in 2016.  Last week he presented to the emergency department with prolonged bleeding from needle hole site following hemodialysis.  Dr. Donnetta Hutching placed 1 pursestring suture to achieve hemostasis.  Based on chart review patient has a fistulogram performed either by interventional radiology or [facility] every several months for ballooning of the long segment approaching the axilla.  Operative reports showed that there is not any cephalic arch or central stenosis that would tend to cause prolonged needle hole bleeding.  Patient states he has not had any prolonged bleeding from needle hole since he was treated in the emergency department.  He is dialyzing at the [facility] on a Monday Wednesday Friday without complication.  He is a nursing facility resident.   Current Outpatient Medications  Medication Sig Dispense Refill  . acetaminophen (TYLENOL) 500 MG tablet Take 500 mg by mouth every 6 (six) hours as needed for mild pain or moderate pain.    Marland Kitchen amLODipine (NORVASC) 10 MG tablet Take 1 tablet (10 mg total) by mouth daily.    Marland Kitchen aspirin 81 MG tablet Take 1 tablet (81 mg total) by mouth daily.    Marland Kitchen atropine 1 % ophthalmic solution Place 1 drop into the left eye daily.     . calcitRIOL (ROCALTROL) 0.25 MCG capsule Take 1 capsule (0.25 mcg total) by mouth daily.    . cholecalciferol 2000 units TABS Take 1 tablet (2,000 Units total) by mouth daily.    . clopidogrel (PLAVIX) 75 MG tablet Take 75 mg by mouth daily.  0  . folic acid (FOLVITE) 1 MG tablet Take 2 tablets (2 mg total) by mouth daily. 180 tablet 3  . ONETOUCH VERIO test strip     . polyethylene glycol (MIRALAX / GLYCOLAX) packet Take 17 g by mouth 2 (two) times daily. (Patient taking differently: Take 17 g by mouth 2  (two) times daily as needed for mild constipation. )    . prednisoLONE acetate (PRED FORTE) 1 % ophthalmic suspension Place 1 drop into the left eye at bedtime. 5 mL 0  . QUEtiapine (SEROQUEL) 25 MG tablet Take 0.5 tablets (12.5 mg total) by mouth at bedtime.    . Red Yeast Rice 600 MG CAPS Take 1 capsule by mouth daily.    Marland Kitchen senna-docusate (SENOKOT-S) 8.6-50 MG tablet Take 2 tablets by mouth daily.     No current facility-administered medications for this visit.     On ROS today: 10 system ROS is negative unless otherwise noted in HPI   Physical Examination   Vitals:   04/03/19 1445  BP: 129/69  Pulse: 74  Resp: 20  Temp: 97.8 F (36.6 C)  TempSrc: Oral  SpO2: 99%  Weight: 200 lb (90.7 kg)  Height: 5\' 8"  (1.727 m)   Body mass index is 30.41 kg/m.  General Alert, O x 3, WD, NAD  Pulmonary Sym exp, good B air movt,   Cardiac RRR, Nl S1, S2,   Vascular Vessel Right Left  Radial Palpable Palpable  Brachial Palpable Palpable  Ulnar Not palpable Not palpable    Musculo- skeletal M/S 5/5 throughout  , palpable thrill L arm fistula.  Overlying skin intact without ulceration    Neurologic A&O; CN grossly intact      Medical  Decision Making   Gregory Hood is a 68 y.o. male who presents with ESRD requiring hemodialysis.    Patent fistula with palpable thrill throughout upper arm  Suture removed in office today, no further bleeding noted  If prolonged needle hole bleeding recurs, patient will require another fistulogram to evaluate for outflow stenosis  He may follow up on an as needed basis  Dagoberto Ligas PA-C [facility] [place] [place] Office: [phone]  Clinic MD: Dr. Donnetta Hutching

## 2019-04-23 ENCOUNTER — Encounter (HOSPITAL_COMMUNITY): Payer: Self-pay | Admitting: Emergency Medicine

## 2019-04-23 ENCOUNTER — Emergency Department (HOSPITAL_COMMUNITY)
Admission: EM | Admit: 2019-04-23 | Discharge: 2019-04-23 | Disposition: A | Discharge status: Home / Self Care | Payer: Medicare Other | Attending: Emergency Medicine | Admitting: Emergency Medicine

## 2019-04-23 DIAGNOSIS — Z79899 Other long term (current) drug therapy: Secondary | ICD-10-CM | POA: Insufficient documentation

## 2019-04-23 DIAGNOSIS — Y69 Unspecified misadventure during surgical and medical care: Secondary | ICD-10-CM | POA: Insufficient documentation

## 2019-04-23 DIAGNOSIS — N186 End stage renal disease: Secondary | ICD-10-CM | POA: Insufficient documentation

## 2019-04-23 DIAGNOSIS — T82590A Other mechanical complication of surgically created arteriovenous fistula, initial encounter: Secondary | ICD-10-CM | POA: Diagnosis present

## 2019-04-23 DIAGNOSIS — I120 Hypertensive chronic kidney disease with stage 5 chronic kidney disease or end stage renal disease: Secondary | ICD-10-CM | POA: Diagnosis not present

## 2019-04-23 DIAGNOSIS — Z87891 Personal history of nicotine dependence: Secondary | ICD-10-CM | POA: Diagnosis not present

## 2019-04-23 DIAGNOSIS — J449 Chronic obstructive pulmonary disease, unspecified: Secondary | ICD-10-CM | POA: Insufficient documentation

## 2019-04-23 DIAGNOSIS — Z8673 Personal history of transient ischemic attack (TIA), and cerebral infarction without residual deficits: Secondary | ICD-10-CM | POA: Diagnosis not present

## 2019-04-23 DIAGNOSIS — E1122 Type 2 diabetes mellitus with diabetic chronic kidney disease: Secondary | ICD-10-CM | POA: Insufficient documentation

## 2019-04-23 DIAGNOSIS — Z7982 Long term (current) use of aspirin: Secondary | ICD-10-CM | POA: Diagnosis not present

## 2019-04-23 DIAGNOSIS — Z992 Dependence on renal dialysis: Secondary | ICD-10-CM | POA: Diagnosis not present

## 2019-04-23 DIAGNOSIS — T829XXA Unspecified complication of cardiac and vascular prosthetic device, implant and graft, initial encounter: Secondary | ICD-10-CM

## 2019-04-23 LAB — CBG MONITORING, ED
Glucose-Capillary: 86 mg/dL (ref 70–99)
Glucose-Capillary: 105 mg/dL — ABNORMAL HIGH (ref 70–99)
Glucose-Capillary: 112 mg/dL — ABNORMAL HIGH (ref 70–99)

## 2019-04-23 NOTE — ED Provider Notes (Signed)
[place] [facility] EMERGENCY DEPARTMENT Provider Note   CSN: [id number] Arrival date & time: 04/23/19  1749     History   Chief Complaint Chief Complaint  Patient presents with  . Vascular Access Problem    HPI Gregory Hood is a 68 y.o. male.     The history is provided by the patient.  Patient presents today after dialysis (MWF) when he was noted to bleeding from his left AV fistula. He states that EMS applied direct pressure and the bleeding stopped now, but the dialysis center was concerned since it originally wouldn't stop. He has a history of needing a stitch placed by vascular surgery for fistula problems. He states he currently has no pain, that he's hungry, and that he would like to go. He had no aggravating factors regarding the bleeding, no medications given, and relief with direct pressure by EMS.  Past Medical History:  Diagnosis Date  . Chronic kidney disease   . COPD (chronic obstructive pulmonary disease) ([place])   . Diabetes mellitus without complication ([place])   . Diabetic retinopathy ([place])   . Hyperlipidemia   . Hypertension   . Obesity   . Orthostatic hypotension   . OSA on CPAP   . Retinal detachment   . Seizures ([place])   . Stroke ([place])   . TIA (transient ischemic attack)   . Umbilical hernia     Patient Active Problem List   Diagnosis Date Noted  . History of completed stroke 07/11/2018  . Acute encephalopathy 07/11/2018  . Cellulitis of right leg 07/11/2018  . Hypertensive urgency 07/11/2018  . Acute exacerbation of CHF (congestive heart failure) ([place]) 03/22/2018  . CVA (cerebral vascular accident) ([place]) 03/22/2018  . OSA (obstructive sleep apnea) 03/22/2018  . BPH (benign prostatic hyperplasia) 03/22/2018  . Thrombocytopenia ([place]) 03/22/2018  . Normocytic anemia 03/22/2018  . Metabolic acidosis [id number]  . Uremia 03/22/2018  . End stage renal disease ([place]) 03/14/2014  . Orthostatic hypotension 02/22/2014  . Elevated troponin  02/22/2014  . Chronic diastolic heart failure ([place]) 02/22/2014  . Elevated lipids 02/22/2014  . Essential hypertension, benign 02/17/2014  . DM (diabetes mellitus), type 2 with renal complications ([place]) [id number]  . Abnormal ECG 02/17/2014    Past Surgical History:  Procedure Laterality Date  . BASCILIC VEIN TRANSPOSITION Left 03/06/2015   Procedure: LET BASCILIC VEIN TRANSPOSITION;  Surgeon: Angelia Mould, MD;  Location: [place];  Service: Vascular;  Laterality: Left;  . CIRCUMCISION    . EYE SURGERY Bilateral    had surgery several different times.  Marland Kitchen HAMMER TOE SURGERY Left 30 years ago   small toe left toe  . HERNIA REPAIR    . IR AV DIALY SHUNT INTRO NEEDLE/INTRACATH INITIAL W/PTA/IMG LEFT  07/20/2018  . SEPTOPLASTY  20 years ago        Home Medications    Prior to Admission medications   Medication Sig Start Date End Date Taking? Authorizing Provider  acetaminophen (TYLENOL) 500 MG tablet Take 500 mg by mouth every 6 (six) hours as needed for mild pain or moderate pain.    [provider]  amLODipine (NORVASC) 10 MG tablet Take 1 tablet (10 mg total) by mouth daily. 03/31/18   Hongalgi, Lenis Dickinson, MD  aspirin 81 MG tablet Take 1 tablet (81 mg total) by mouth daily. 03/30/18   Hongalgi, Lenis Dickinson, MD  atropine 1 % ophthalmic solution Place 1 drop into the left eye daily.  06/30/18 06/30/19  [provider]  calcitRIOL (ROCALTROL) 0.25 MCG capsule Take 1 capsule (0.25 mcg total) by mouth daily. 03/30/18   Hongalgi, Lenis Dickinson, MD  cholecalciferol 2000 units TABS Take 1 tablet (2,000 Units total) by mouth daily. 03/31/18   Hongalgi, Lenis Dickinson, MD  clopidogrel (PLAVIX) 75 MG tablet Take 75 mg by mouth daily. 03/10/18   [provider]  folic acid (FOLVITE) 1 MG tablet Take 2 tablets (2 mg total) by mouth daily. 04/03/14   Josue Hector, MD  ONETOUCH VERIO test strip  01/08/14   [provider]  polyethylene glycol (MIRALAX / GLYCOLAX) packet Take 17 g by  mouth 2 (two) times daily. Patient taking differently: Take 17 g by mouth 2 (two) times daily as needed for mild constipation.  03/30/18   Hongalgi, Lenis Dickinson, MD  prednisoLONE acetate (PRED FORTE) 1 % ophthalmic suspension Place 1 drop into the left eye at bedtime. 07/18/18   Antonieta Pert, MD  QUEtiapine (SEROQUEL) 25 MG tablet Take 0.5 tablets (12.5 mg total) by mouth at bedtime. 07/18/18   Antonieta Pert, MD  Red Yeast Rice 600 MG CAPS Take 1 capsule by mouth daily.    [provider]  senna-docusate (SENOKOT-S) 8.6-50 MG tablet Take 2 tablets by mouth daily. 07/19/18   Antonieta Pert, MD    Family History Family History  Problem Relation Age of Onset  . Hypertension Mother   . Diabetes type II Mother   . Transient ischemic attack Mother   . Kidney disease Mother   . Diabetes Mother   . Varicose Veins Mother   . Hypertension Father   . Diabetes type II Father   . Diabetes Father   . Diabetes type II Sister   . Hypertension Sister   . Diabetes Sister   . Learning disabilities Maternal Uncle     Social History Social History   Tobacco Use  . Smoking status: Former Smoker    Types: Cigarettes    Quit date: 03/05/1974    Years since quitting: 45.1  . Smokeless tobacco: Never Used  Substance Use Topics  . Alcohol use: No    Alcohol/week: 0.0 standard drinks  . Drug use: No     Allergies   Bee venom, Lexiscan [regadenoson], and Ace inhibitors   Review of Systems Review of Systems  Constitutional: Negative for fever.  Eyes:       Baseline  Respiratory: Negative for cough and shortness of breath.   Cardiovascular: Negative for chest pain and palpitations.  Gastrointestinal: Negative for abdominal pain, nausea and vomiting.  Skin: Positive for wound. Negative for rash.  Neurological: Negative for syncope and weakness.  All other systems reviewed and are negative.    Physical Exam Updated Vital Signs BP (!) 173/97   Pulse 80   Temp 98.1 F (36.7 C) (Oral)   Resp (!)  22   Ht 5\' 8"  (1.727 m)   Wt 90.7 kg   SpO2 100%   BMI 30.41 kg/m   Physical Exam Vitals signs and nursing note reviewed.  Constitutional:      Appearance: He is well-developed. He is obese.  HENT:     Head: Normocephalic and atraumatic.  Eyes:     Conjunctiva/sclera: Conjunctivae normal.     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  Cardiovascular:     Rate and Rhythm: Normal rate and regular rhythm.     Pulses: Normal pulses.     Heart sounds: No  murmur.     Comments: 2+ radial pulses bilaterally Pulmonary:     Effort: Pulmonary effort is normal. No respiratory distress.     Breath sounds: Normal breath sounds.  Abdominal:     General: Bowel sounds are normal.     Palpations: Abdomen is soft.  Skin:    General: Skin is warm and dry.     Comments: Palpable thrill over left AV fistula. No current bleeding, hemostatic, no wounds or ulcerations present.  Neurological:     Mental Status: He is alert.      ED Treatments / Results  Labs (all labs ordered are listed, but only abnormal results are displayed) Labs Reviewed  CBG MONITORING, ED - Abnormal; Notable for the following components:      Result Value   Glucose-Capillary 105 (*)    All other components within normal limits  CBG MONITORING, ED    EKG None  Radiology No results found.  Procedures Procedures (including critical care time)  Medications Ordered in ED Medications - No data to display   Initial Impression / Assessment and Plan / ED Course  I have reviewed the triage vital signs and the nursing notes.  Pertinent labs & imaging results that were available during my care of the patient were reviewed by me and considered in my medical decision making (see chart for details).        Gregory Hood is a 68 y.o. male presents today after dialysis (MWF) when he was noted to bleeding from his left AV fistula.  Patient was hemostatic at the time of my  evaluation. He tolerated po intake and is at his baseline. After observation, bleeding did not restart. He is not on a blood thinner. Stable for discharge at this time. He was instructed to follow up with his PCP within a week as needed. All questions answered. Care of patient was discussed with the supervising attending.  Final Clinical Impressions(s) / ED Diagnoses   Final diagnoses:  ESRD (end stage renal disease) ([place])  Complication of AV dialysis fistula, initial encounter    ED Discharge Orders    None       Julianne Rice, MD 04/23/19 1911    Lajean Saver, MD 04/23/19 2251

## 2019-04-23 NOTE — ED Triage Notes (Signed)
BIB EMS from dialysis center, reports continued bleeding from fisutal. Currently no active bleeding. VSS.

## 2019-04-23 NOTE — Discharge Instructions (Addendum)
Please make sure to continue your dialysis appointments. Please follow up with your PCP within a week to make sure you continue to remain symptom free.

## 2019-04-23 NOTE — ED Notes (Signed)
[facility] her to pick pt up.

## 2019-04-23 NOTE — ED Notes (Signed)
[facility] called  

## 2020-09-27 IMAGING — CT CT HEAD W/O CM
3 series · 14 of 47 positions shown, 16 images · non-contrast
Comparison: 07/12/2018

CLINICAL DATA: Stroke follow-up

EXAM:
CT HEAD WITHOUT CONTRAST
TECHNIQUE: Contiguous axial images were obtained from the base of the skull
through the vertex without intravenous contrast.

[Series 4: head 5.0 h30s · axial · 0.41mm/px · z∈[-120,+10]mm · 8 of 32 slices shown, 10 images]
[im 3/32  brain]
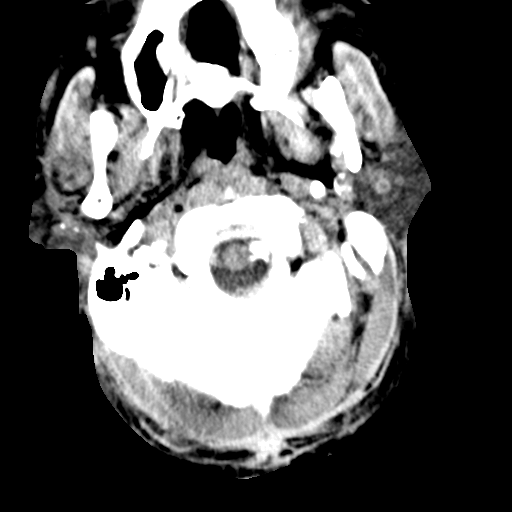
[im 3/32  bone]
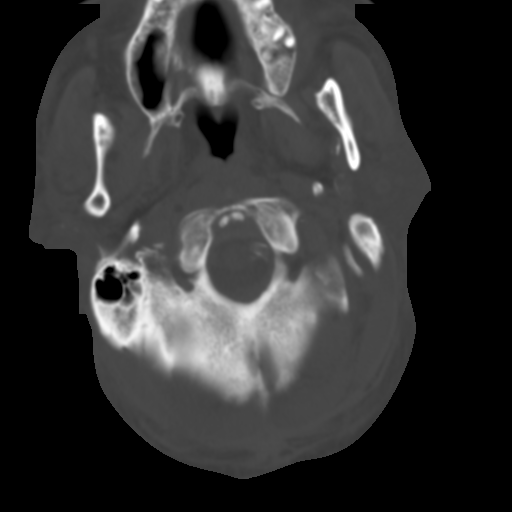
[im 7/32  brain]
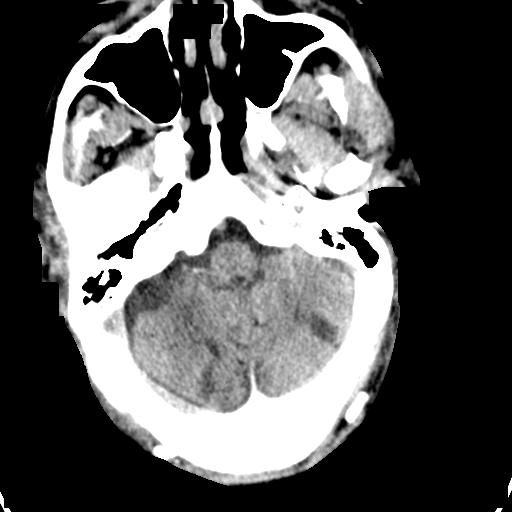
[im 10/32  brain]
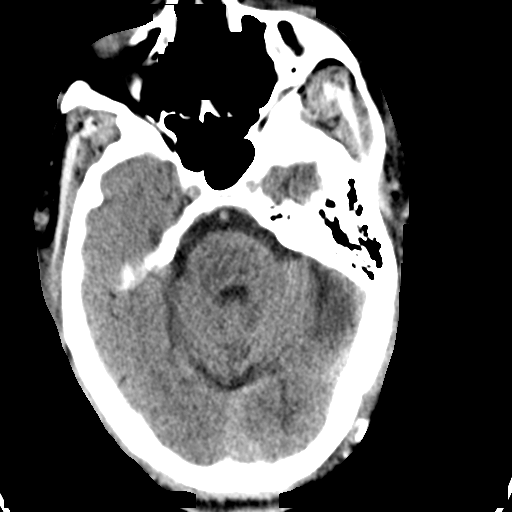
[im 14/32  brain]
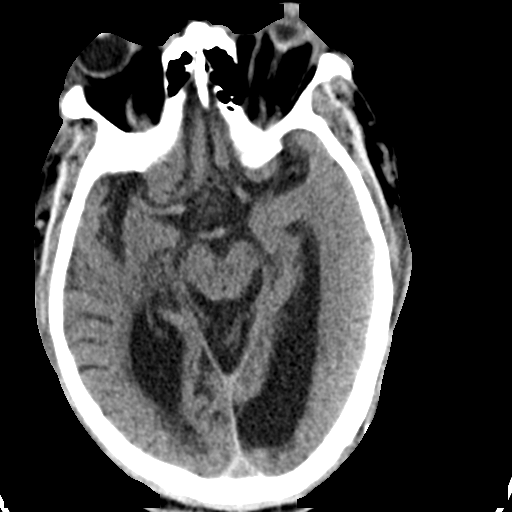
[im 18/32  brain]
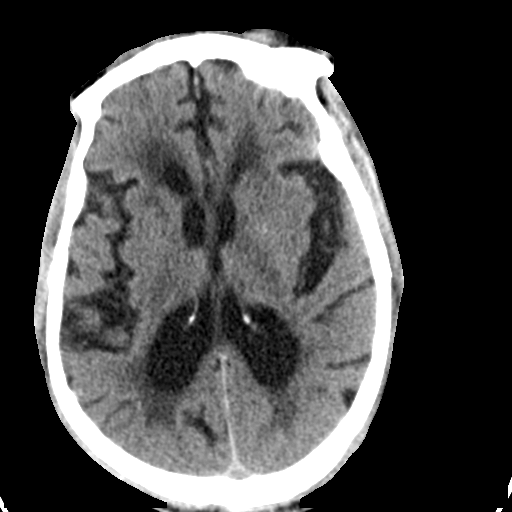
[im 18/32  bone]
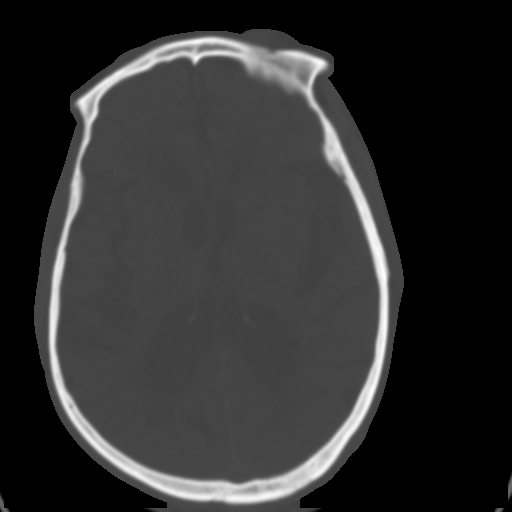
[im 22/32  brain]
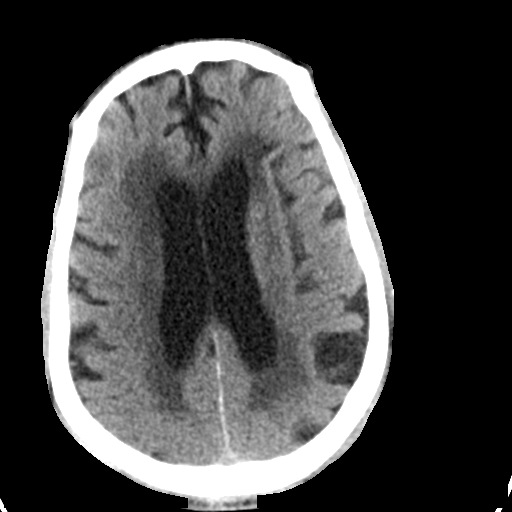
[im 25/32  brain]
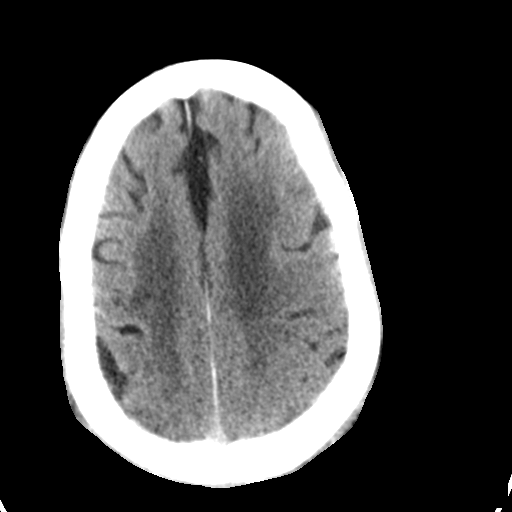
[im 29/32  brain]
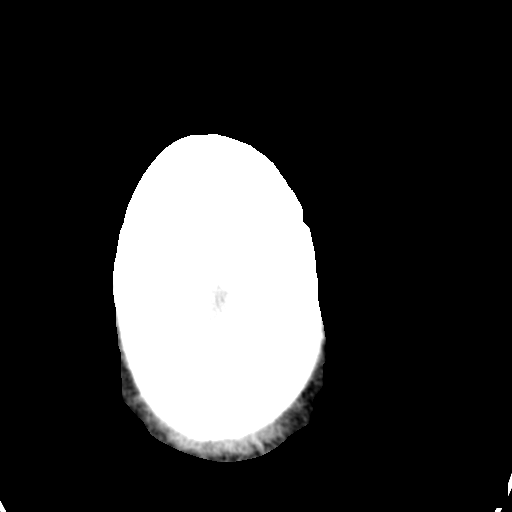

[Series 5: head 3.0 mpr cor · coronal · 0.31mm/px · 3 of 72 slices shown]
[im 24/72  brain]
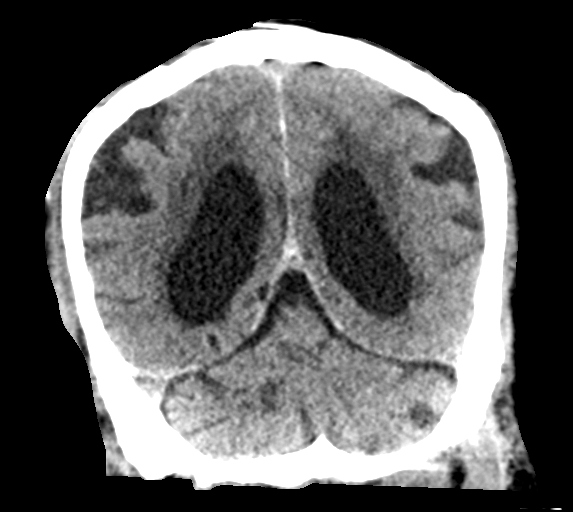
[im 32/72  brain]
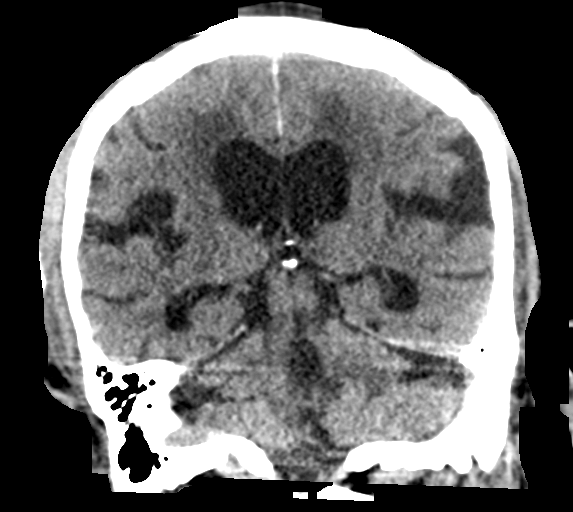
[im 40/72  brain]
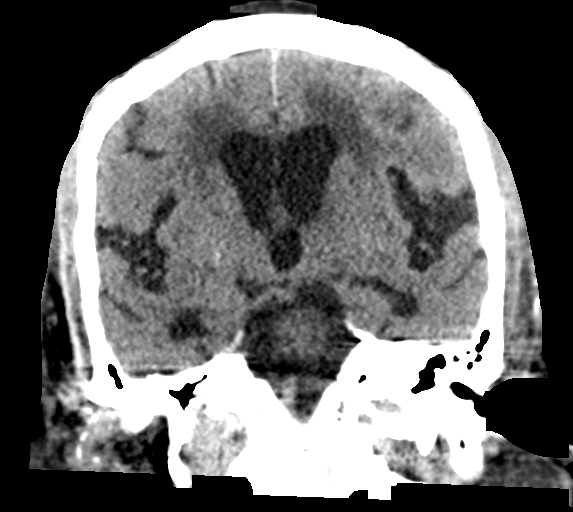

[Series 6: head 3.0 mpr sag · sagittal · 0.31mm/px · 3 of 67 slices shown]
[im 23/67  brain]
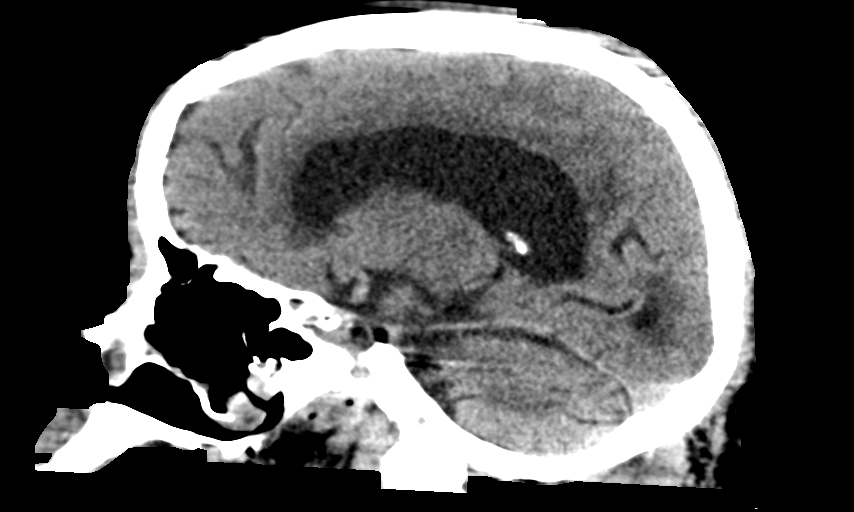
[im 34/67  brain]
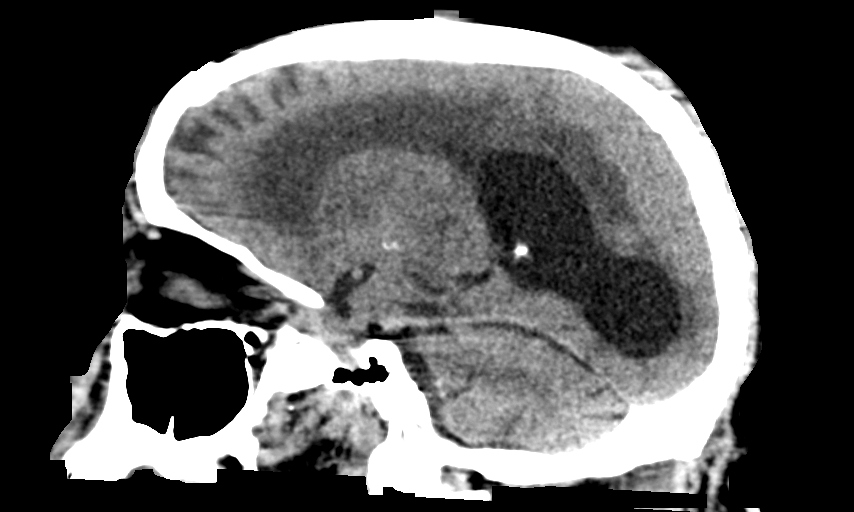
[im 45/67  brain]
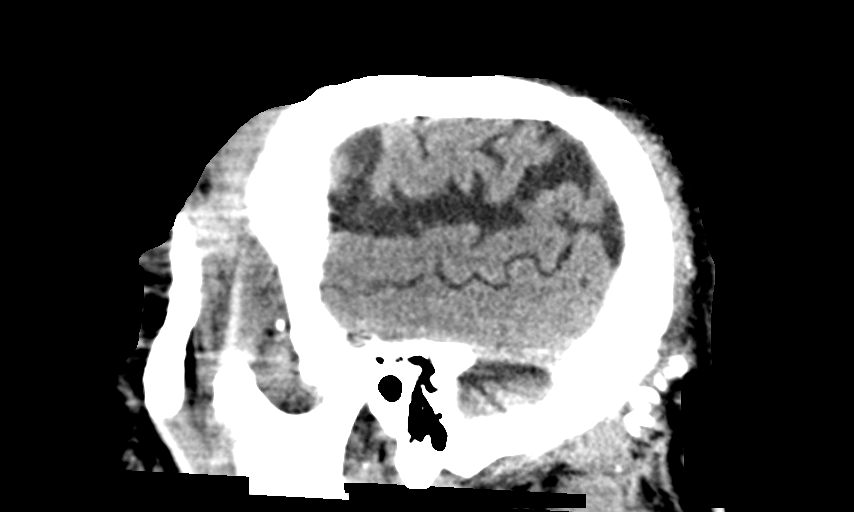

[14 of 47 positions shown; findings below may reference images not displayed]

FINDINGS: Brain: Multiple remote small infarcts in the bilateral cerebellum.
There is confluent low-density in the deep cerebral white matter and
pons. Remote lacunar infarct in the bilateral thalamus. Small to
moderate remote parasagittal left occipital cortex infarct. Atrophy
with central and perisylvian predilection. There is disproportionate
sulcal crowding at the vertex and some up lifting of the corpus
callosum, degree of communicating hydrocephalus not excluded. No
evidence of acute infarct, acute hemorrhage, obstructive
hydrocephalus, or mass.

Vascular: Atherosclerotic calcification.  No hyperdense vessel.

Skull: No acute finding

Sinuses/Orbits: Phthisis bulbi on the left.
IMPRESSION: 1. No acute finding.
2. Advanced chronic ischemic injury.
3. Ventriculomegaly from brain atrophy. Nonspecific finding
sometimes seen with communicating hydrocephalus.

## 2020-10-08 ENCOUNTER — Emergency Department (HOSPITAL_COMMUNITY): Payer: Medicare Other

## 2020-10-08 ENCOUNTER — Inpatient Hospital Stay (HOSPITAL_COMMUNITY)
Admission: EM | Admit: 2020-10-08 | Discharge: 2020-10-11 | DRG: 312 | Disposition: A | Discharge status: Skilled Nursing Facility | Payer: Medicare Other | Source: Skilled Nursing Facility | Attending: Internal Medicine | Admitting: Internal Medicine

## 2020-10-08 ENCOUNTER — Other Ambulatory Visit: Payer: Self-pay

## 2020-10-08 ENCOUNTER — Encounter (HOSPITAL_COMMUNITY): Payer: Self-pay

## 2020-10-08 DIAGNOSIS — I132 Hypertensive heart and chronic kidney disease with heart failure and with stage 5 chronic kidney disease, or end stage renal disease: Secondary | ICD-10-CM | POA: Diagnosis present

## 2020-10-08 DIAGNOSIS — I10 Essential (primary) hypertension: Secondary | ICD-10-CM | POA: Diagnosis present

## 2020-10-08 DIAGNOSIS — Z91048 Other nonmedicinal substance allergy status: Secondary | ICD-10-CM

## 2020-10-08 DIAGNOSIS — Z8249 Family history of ischemic heart disease and other diseases of the circulatory system: Secondary | ICD-10-CM

## 2020-10-08 DIAGNOSIS — F418 Other specified anxiety disorders: Secondary | ICD-10-CM | POA: Diagnosis present

## 2020-10-08 DIAGNOSIS — Z7982 Long term (current) use of aspirin: Secondary | ICD-10-CM

## 2020-10-08 DIAGNOSIS — D649 Anemia, unspecified: Secondary | ICD-10-CM | POA: Diagnosis present

## 2020-10-08 DIAGNOSIS — I878 Other specified disorders of veins: Secondary | ICD-10-CM | POA: Diagnosis present

## 2020-10-08 DIAGNOSIS — I5032 Chronic diastolic (congestive) heart failure: Secondary | ICD-10-CM | POA: Diagnosis present

## 2020-10-08 DIAGNOSIS — I272 Pulmonary hypertension, unspecified: Secondary | ICD-10-CM | POA: Diagnosis present

## 2020-10-08 DIAGNOSIS — I953 Hypotension of hemodialysis: Secondary | ICD-10-CM | POA: Diagnosis not present

## 2020-10-08 DIAGNOSIS — G4733 Obstructive sleep apnea (adult) (pediatric): Secondary | ICD-10-CM | POA: Diagnosis present

## 2020-10-08 DIAGNOSIS — Z794 Long term (current) use of insulin: Secondary | ICD-10-CM

## 2020-10-08 DIAGNOSIS — N2581 Secondary hyperparathyroidism of renal origin: Secondary | ICD-10-CM | POA: Diagnosis present

## 2020-10-08 DIAGNOSIS — H548 Legal blindness, as defined in USA: Secondary | ICD-10-CM | POA: Diagnosis present

## 2020-10-08 DIAGNOSIS — N186 End stage renal disease: Secondary | ICD-10-CM | POA: Diagnosis present

## 2020-10-08 DIAGNOSIS — E669 Obesity, unspecified: Secondary | ICD-10-CM | POA: Diagnosis present

## 2020-10-08 DIAGNOSIS — E1129 Type 2 diabetes mellitus with other diabetic kidney complication: Secondary | ICD-10-CM | POA: Diagnosis present

## 2020-10-08 DIAGNOSIS — I951 Orthostatic hypotension: Secondary | ICD-10-CM | POA: Diagnosis present

## 2020-10-08 DIAGNOSIS — Z950 Presence of cardiac pacemaker: Secondary | ICD-10-CM

## 2020-10-08 DIAGNOSIS — R000 Tachycardia, unspecified: Secondary | ICD-10-CM

## 2020-10-08 DIAGNOSIS — Z20822 Contact with and (suspected) exposure to covid-19: Secondary | ICD-10-CM | POA: Diagnosis present

## 2020-10-08 DIAGNOSIS — Z841 Family history of disorders of kidney and ureter: Secondary | ICD-10-CM

## 2020-10-08 DIAGNOSIS — E1122 Type 2 diabetes mellitus with diabetic chronic kidney disease: Secondary | ICD-10-CM | POA: Diagnosis present

## 2020-10-08 DIAGNOSIS — E8889 Other specified metabolic disorders: Secondary | ICD-10-CM | POA: Diagnosis present

## 2020-10-08 DIAGNOSIS — Z91030 Bee allergy status: Secondary | ICD-10-CM

## 2020-10-08 DIAGNOSIS — R55 Syncope and collapse: Secondary | ICD-10-CM | POA: Diagnosis not present

## 2020-10-08 DIAGNOSIS — E11319 Type 2 diabetes mellitus with unspecified diabetic retinopathy without macular edema: Secondary | ICD-10-CM | POA: Diagnosis present

## 2020-10-08 DIAGNOSIS — F0280 Dementia in other diseases classified elsewhere without behavioral disturbance: Secondary | ICD-10-CM | POA: Diagnosis present

## 2020-10-08 DIAGNOSIS — Z8673 Personal history of transient ischemic attack (TIA), and cerebral infarction without residual deficits: Secondary | ICD-10-CM

## 2020-10-08 DIAGNOSIS — Z992 Dependence on renal dialysis: Secondary | ICD-10-CM

## 2020-10-08 DIAGNOSIS — I712 Thoracic aortic aneurysm, without rupture: Secondary | ICD-10-CM | POA: Diagnosis present

## 2020-10-08 DIAGNOSIS — Z6827 Body mass index (BMI) 27.0-27.9, adult: Secondary | ICD-10-CM

## 2020-10-08 DIAGNOSIS — Z833 Family history of diabetes mellitus: Secondary | ICD-10-CM

## 2020-10-08 DIAGNOSIS — Z79899 Other long term (current) drug therapy: Secondary | ICD-10-CM

## 2020-10-08 DIAGNOSIS — I472 Ventricular tachycardia: Secondary | ICD-10-CM

## 2020-10-08 DIAGNOSIS — G309 Alzheimer's disease, unspecified: Secondary | ICD-10-CM | POA: Diagnosis present

## 2020-10-08 DIAGNOSIS — Z888 Allergy status to other drugs, medicaments and biological substances status: Secondary | ICD-10-CM

## 2020-10-08 DIAGNOSIS — J449 Chronic obstructive pulmonary disease, unspecified: Secondary | ICD-10-CM | POA: Diagnosis present

## 2020-10-08 DIAGNOSIS — E785 Hyperlipidemia, unspecified: Secondary | ICD-10-CM | POA: Diagnosis present

## 2020-10-08 DIAGNOSIS — I495 Sick sinus syndrome: Secondary | ICD-10-CM | POA: Diagnosis present

## 2020-10-08 DIAGNOSIS — Z87891 Personal history of nicotine dependence: Secondary | ICD-10-CM

## 2020-10-08 LAB — CBC WITH DIFFERENTIAL/PLATELET
Abs Immature Granulocytes: 0.01 10*3/uL (ref 0.00–0.07)
Basophils Absolute: 0 10*3/uL (ref 0.0–0.1)
Basophils Relative: 0 %
Eosinophils Absolute: 0.1 10*3/uL (ref 0.0–0.5)
Eosinophils Relative: 3 %
HCT: 32.8 % — ABNORMAL LOW (ref 39.0–52.0)
Hemoglobin: 10.5 g/dL — ABNORMAL LOW (ref 13.0–17.0)
Immature Granulocytes: 0 %
Lymphocytes Relative: 14 %
Lymphs Abs: 0.6 10*3/uL — ABNORMAL LOW (ref 0.7–4.0)
MCH: 30.3 pg (ref 26.0–34.0)
MCHC: 32 g/dL (ref 30.0–36.0)
MCV: 94.5 fL (ref 80.0–100.0)
Monocytes Absolute: 0.6 10*3/uL (ref 0.1–1.0)
Monocytes Relative: 13 %
Neutro Abs: 3.3 10*3/uL (ref 1.7–7.7)
Neutrophils Relative %: 70 %
Platelets: 173 10*3/uL (ref 150–400)
RBC: 3.47 MIL/uL — ABNORMAL LOW (ref 4.22–5.81)
RDW: 12.8 % (ref 11.5–15.5)
WBC: 4.7 10*3/uL (ref 4.0–10.5)
nRBC: 0 % (ref 0.0–0.2)

## 2020-10-08 LAB — COMPREHENSIVE METABOLIC PANEL
ALT: 24 U/L (ref 0–44)
AST: 31 U/L (ref 15–41)
Albumin: 3.6 g/dL (ref 3.5–5.0)
Alkaline Phosphatase: 82 U/L (ref 38–126)
Anion gap: 7 (ref 5–15)
BUN: 17 mg/dL (ref 8–23)
CO2: 36 mmol/L — ABNORMAL HIGH (ref 22–32)
Calcium: 9.1 mg/dL (ref 8.9–10.3)
Chloride: 93 mmol/L — ABNORMAL LOW (ref 98–111)
Creatinine, Ser: 4.96 mg/dL — ABNORMAL HIGH (ref 0.61–1.24)
GFR, Estimated: 12 mL/min — ABNORMAL LOW (ref >60)
Glucose, Bld: 241 mg/dL — ABNORMAL HIGH (ref 70–99)
Potassium: 3.5 mmol/L (ref 3.5–5.1)
Sodium: 136 mmol/L (ref 135–145)
Total Bilirubin: 0.7 mg/dL (ref 0.3–1.2)
Total Protein: 7.3 g/dL (ref 6.5–8.1)

## 2020-10-08 LAB — TROPONIN I (HIGH SENSITIVITY): Troponin I (High Sensitivity): 63 ng/L — ABNORMAL HIGH (ref <18)

## 2020-10-08 LAB — MAGNESIUM: Magnesium: 2.3 mg/dL (ref 1.7–2.4)

## 2020-10-08 LAB — LIPASE, BLOOD: Lipase: 101 U/L — ABNORMAL HIGH (ref 11–51)

## 2020-10-08 MED ORDER — ONDANSETRON HCL 4 MG/2ML IJ SOLN: 4.0000 mg | Freq: Four times a day (QID) | INTRAMUSCULAR | Status: DC | PRN

## 2020-10-08 MED ORDER — MEMANTINE HCL 10 MG PO TABS
10.0000 mg | ORAL_TABLET | Freq: Two times a day (BID) | ORAL | Status: DC
  Administered 2020-10-09 – 2020-10-11 (×6): 10 mg via ORAL
  Filled 2020-10-08 (×7): qty 1

## 2020-10-08 MED ORDER — CYANOCOBALAMIN 1000 MCG/ML IJ SOLN: 1000.0000 ug | INTRAMUSCULAR | Status: DC

## 2020-10-08 MED ORDER — POTASSIUM CHLORIDE CRYS ER 20 MEQ PO TBCR
40.0000 meq | EXTENDED_RELEASE_TABLET | Freq: Once | ORAL | Status: AC
  Administered 2020-10-08: 40 meq via ORAL
  Filled 2020-10-08: qty 2

## 2020-10-08 MED ORDER — VITAMIN D 25 MCG (1000 UNIT) PO TABS
2000.0000 [IU] | ORAL_TABLET | Freq: Every day | ORAL | Status: DC
  Administered 2020-10-09 – 2020-10-11 (×3): 2000 [IU] via ORAL
  Filled 2020-10-08 (×3): qty 2

## 2020-10-08 MED ORDER — SODIUM CHLORIDE 0.9% FLUSH
3.0000 mL | Freq: Two times a day (BID) | INTRAVENOUS | Status: DC
  Administered 2020-10-09 – 2020-10-10 (×4): 3 mL via INTRAVENOUS

## 2020-10-08 MED ORDER — ACETAMINOPHEN 325 MG PO TABS: 650.0000 mg | ORAL_TABLET | Freq: Four times a day (QID) | ORAL | Status: DC | PRN

## 2020-10-08 MED ORDER — ONDANSETRON HCL 4 MG PO TABS
4.0000 mg | ORAL_TABLET | Freq: Four times a day (QID) | ORAL | Status: DC | PRN
Start: 2020-10-08 — End: 2020-10-11

## 2020-10-08 MED ORDER — INSULIN ASPART 100 UNIT/ML ~~LOC~~ SOLN
0.0000 [IU] | Freq: Three times a day (TID) | SUBCUTANEOUS | Status: DC
  Administered 2020-10-09: 1 [IU] via SUBCUTANEOUS
  Administered 2020-10-09 – 2020-10-11 (×2): 2 [IU] via SUBCUTANEOUS
  Administered 2020-10-11: 1 [IU] via SUBCUTANEOUS

## 2020-10-08 MED ORDER — RENA-VITE PO TABS
1.0000 | ORAL_TABLET | Freq: Every day | ORAL | Status: DC
  Administered 2020-10-09 – 2020-10-10 (×2): 1 via ORAL
  Filled 2020-10-08 (×2): qty 1

## 2020-10-08 MED ORDER — NIACIN 100 MG PO TABS
200.0000 mg | ORAL_TABLET | Freq: Every evening | ORAL | Status: DC
  Administered 2020-10-09 – 2020-10-10 (×3): 200 mg via ORAL
  Filled 2020-10-08 (×4): qty 2

## 2020-10-08 MED ORDER — FOLIC ACID 1 MG PO TABS
1.0000 mg | ORAL_TABLET | Freq: Every day | ORAL | Status: DC
  Administered 2020-10-09 – 2020-10-11 (×3): 1 mg via ORAL
  Filled 2020-10-08 (×3): qty 1

## 2020-10-08 MED ORDER — EZETIMIBE 10 MG PO TABS
10.0000 mg | ORAL_TABLET | Freq: Every evening | ORAL | Status: DC
  Administered 2020-10-09 – 2020-10-10 (×3): 10 mg via ORAL
  Filled 2020-10-08 (×3): qty 1

## 2020-10-08 MED ORDER — HEPARIN SODIUM (PORCINE) 5000 UNIT/ML IJ SOLN
5000.0000 [IU] | Freq: Three times a day (TID) | INTRAMUSCULAR | Status: DC
  Administered 2020-10-09 – 2020-10-11 (×7): 5000 [IU] via SUBCUTANEOUS
  Filled 2020-10-08 (×7): qty 1

## 2020-10-08 MED ORDER — SENNOSIDES-DOCUSATE SODIUM 8.6-50 MG PO TABS
1.0000 | ORAL_TABLET | Freq: Two times a day (BID) | ORAL | Status: DC
  Administered 2020-10-09 – 2020-10-11 (×6): 1 via ORAL
  Filled 2020-10-08 (×6): qty 1

## 2020-10-08 MED ORDER — POLYETHYLENE GLYCOL 3350 17 G PO PACK: 17.0000 g | PACK | Freq: Every day | ORAL | Status: DC | PRN

## 2020-10-08 MED ORDER — ACETAMINOPHEN 500 MG PO TABS
500.0000 mg | ORAL_TABLET | Freq: Every day | ORAL | Status: DC
  Administered 2020-10-09 – 2020-10-11 (×3): 500 mg via ORAL
  Filled 2020-10-08 (×3): qty 1

## 2020-10-08 MED ORDER — FINASTERIDE 5 MG PO TABS
5.0000 mg | ORAL_TABLET | Freq: Every day | ORAL | Status: DC
  Administered 2020-10-09 – 2020-10-11 (×3): 5 mg via ORAL
  Filled 2020-10-08 (×4): qty 1

## 2020-10-08 MED ORDER — PREDNISOLONE ACETATE 1 % OP SUSP
1.0000 [drp] | Freq: Two times a day (BID) | OPHTHALMIC | Status: DC
  Administered 2020-10-09 – 2020-10-11 (×6): 1 [drp] via OPHTHALMIC
  Filled 2020-10-08 (×2): qty 5

## 2020-10-08 MED ORDER — ACETAMINOPHEN 650 MG RE SUPP: 650.0000 mg | Freq: Four times a day (QID) | RECTAL | Status: DC | PRN

## 2020-10-08 MED ORDER — CALCIUM ACETATE (PHOS BINDER) 667 MG PO CAPS
667.0000 mg | ORAL_CAPSULE | Freq: Three times a day (TID) | ORAL | Status: DC
  Administered 2020-10-09 – 2020-10-11 (×7): 667 mg via ORAL
  Filled 2020-10-08 (×10): qty 1

## 2020-10-08 MED ORDER — RED YEAST RICE 600 MG PO CAPS: 1200.0000 mg | ORAL_CAPSULE | Freq: Every day | ORAL | Status: DC

## 2020-10-08 MED ORDER — CLOPIDOGREL BISULFATE 75 MG PO TABS
75.0000 mg | ORAL_TABLET | Freq: Every day | ORAL | Status: DC
  Administered 2020-10-09 – 2020-10-11 (×3): 75 mg via ORAL
  Filled 2020-10-08 (×3): qty 1

## 2020-10-08 MED ORDER — GLUCOSE 40 % PO GEL: 1.0000 | ORAL | Status: DC | PRN

## 2020-10-08 MED ORDER — ASPIRIN 81 MG PO CHEW
81.0000 mg | CHEWABLE_TABLET | Freq: Every day | ORAL | Status: DC
  Administered 2020-10-09 – 2020-10-10 (×3): 81 mg via ORAL
  Filled 2020-10-08 (×3): qty 1

## 2020-10-08 MED ORDER — BISACODYL 10 MG RE SUPP: 10.0000 mg | RECTAL | Status: DC | PRN

## 2020-10-08 NOTE — ED Notes (Signed)
Hospitalitis at bedside at this time

## 2020-10-08 NOTE — ED Notes (Signed)
Patient transported to CT 

## 2020-10-08 NOTE — ED Notes (Signed)
Called lab to add on labs

## 2020-10-08 NOTE — ED Provider Notes (Signed)
[facility] EMERGENCY DEPARTMENT Provider Note   CSN: [id number] Arrival date & time: 10/08/20  1658     History Chief Complaint  Patient presents with  . Loss of Consciousness  . Emesis    Gregory Hood is a 70 y.o. male.  HPI Patient is 70 year old male presented today after syncopal episode.  [facility] at SNF on the ground.  He had received dialysis this morning and had been home for approximately 1 hour when he was found on the ground.  He was on if I have a heart rate of 30 per nursing staff at [facility].  By the time EMS arrived patient was with heart rate of approximately 70s/80s During transport patient was found to have abnormal rhythm strip with wide-complex tachycardia questionable whether he has been paced during this episode.  Pacemaker was interrogated by bedside RN and found to have no episodes/abnormalities here. He does seem to have had episodes of lightheadedness and hypotension after dialysis.  He had dialysis this morning 1 hour before arrival.  Patient is followed by [facility] cardiology.  He had his [employer]onic biventricular pacemaker evaluated earlier this week.  Patient denies any pain or symptoms presently apart from mild headache.  No other associate symptoms.     Past Medical History:  Diagnosis Date  . Chronic kidney disease   . COPD (chronic obstructive pulmonary disease) ([place])   . Diabetes mellitus without complication ([place])   . Diabetic retinopathy ([place])   . Hyperlipidemia   . Hypertension   . Obesity   . Orthostatic hypotension   . OSA on CPAP   . Retinal detachment   . Seizures ([place])   . Stroke ([place])   . TIA (transient ischemic attack)   . Umbilical hernia     Patient Active Problem List   Diagnosis Date Noted  . Wide-complex tachycardia ([place]) 10/08/2020  . Syncope 10/08/2020  . History of completed stroke 07/11/2018  . Acute encephalopathy 07/11/2018  . Cellulitis of right leg 07/11/2018  . Hypertensive urgency 07/11/2018   . Acute exacerbation of CHF (congestive heart failure) ([place]) 03/22/2018  . CVA (cerebral vascular accident) ([place]) 03/22/2018  . OSA (obstructive sleep apnea) 03/22/2018  . BPH (benign prostatic hyperplasia) 03/22/2018  . Thrombocytopenia ([place]) 03/22/2018  . Normocytic anemia 03/22/2018  . Metabolic acidosis [id number]  . Uremia 03/22/2018  . End stage renal disease ([place]) 03/14/2014  . Orthostatic hypotension 02/22/2014  . Elevated troponin 02/22/2014  . Chronic diastolic heart failure ([place]) 02/22/2014  . Elevated lipids 02/22/2014  . Essential hypertension, benign 02/17/2014  . DM (diabetes mellitus), type 2 with renal complications ([place]) [id number]  . Abnormal ECG 02/17/2014    Past Surgical History:  Procedure Laterality Date  . BASCILIC VEIN TRANSPOSITION Left 03/06/2015   Procedure: LET BASCILIC VEIN TRANSPOSITION;  Surgeon: Angelia Mould, MD;  Location: [facility];  Service: Vascular;  Laterality: Left;  . CIRCUMCISION    . EYE SURGERY Bilateral    had surgery several different times.  Marland Kitchen HAMMER TOE SURGERY Left 30 years ago   small toe left toe  . HERNIA REPAIR    . IR AV DIALY SHUNT INTRO NEEDLE/INTRACATH INITIAL W/PTA/IMG LEFT  07/20/2018  . SEPTOPLASTY  20 years ago       Family History  Problem Relation Age of Onset  . Hypertension Mother   . Diabetes type II Mother   . Transient ischemic attack Mother   . Kidney disease Mother   . Diabetes Mother   .  Varicose Veins Mother   . Hypertension Father   . Diabetes type II Father   . Diabetes Father   . Diabetes type II Sister   . Hypertension Sister   . Diabetes Sister   . Learning disabilities Maternal Uncle     Social History   Tobacco Use  . Smoking status: Former Smoker    Types: Cigarettes    Quit date: 03/05/1974    Years since quitting: 46.6  . Smokeless tobacco: Never Used  Vaping Use  . Vaping Use: Never used  Substance Use Topics  . Alcohol use: No    Alcohol/week: 0.0 standard drinks  .  Drug use: No    Home Medications Prior to Admission medications   Medication Sig Start Date End Date Taking? Authorizing Provider  acetaminophen (TYLENOL) 500 MG tablet Take 500 mg by mouth daily.   Yes [provider]  aspirin 81 MG tablet Take 1 tablet (81 mg total) by mouth daily. Patient taking differently: Take 81 mg by mouth at bedtime. 03/30/18  Yes Hongalgi, Lenis Dickinson, MD  bisacodyl (DULCOLAX) 10 MG suppository Place 10 mg rectally as needed for moderate constipation.   Yes [provider]  calcium acetate (PHOSLO) 667 MG capsule Take 667 mg by mouth 3 (three) times daily. 08/29/20  Yes [provider]  carvedilol (COREG) 3.125 MG tablet Take 6.25 mg by mouth See admin instructions. 2 tabs in the morning on Tuesday,thursday,saturday and Sunday. Take 2 tabs every evening. 08/29/20  Yes [provider]  carvedilol (COREG) 6.25 MG tablet Take 6.25 mg by mouth See admin instructions. Take 1 tab hs on Tuesday,Thursday and saturday   Yes [provider]  cholecalciferol 2000 units TABS Take 1 tablet (2,000 Units total) by mouth daily. 03/31/18  Yes Hongalgi, Lenis Dickinson, MD  clopidogrel (PLAVIX) 75 MG tablet Take 75 mg by mouth daily. 03/10/18  Yes [provider]  cyanocobalamin (,VITAMIN B-12,) 1000 MCG/ML injection Inject 1,000 mcg into the muscle every 30 (thirty) days. On the 26th   Yes [provider]  Dextrose, Diabetic Use, (GLUCOSE) 77.4 % GEL Take 1 Dose by mouth as needed (blood glucose less than 70).   Yes [provider]  EPINEPHrine (EPIPEN 2-PAK IJ) Inject 0.3 mg into the muscle as needed (anaphyylaxis).   Yes [provider]  ezetimibe (ZETIA) 10 MG tablet Take 10 mg by mouth every evening. 04/22/19  Yes [provider]  finasteride (PROSCAR) 5 MG tablet Take 5 mg by mouth daily. 03/26/19  Yes [provider]  folic acid (FOLVITE) 1 MG tablet Take 2 tablets (2 mg total) by mouth daily. Patient  taking differently: Take 1 mg by mouth daily. 04/03/14  Yes Josue Hector, MD  Glucagon, rDNA, (GLUCAGON EMERGENCY IJ) Inject 1 mg into the muscle as needed (blood sugar less than 70).   Yes [provider]  hydrALAZINE (APRESOLINE) 25 MG tablet Take 25 mg by mouth See admin instructions. Monday,Wednesday and Friday at bedtime. 04/07/19  Yes [provider]  insulin lispro (HUMALOG) 100 UNIT/ML injection Inject 2-10 Units into the skin See admin instructions. Bid per sliding scale Less than 150= 0 units, 151-200= 2 units, 201-250=4 units, 251-300= 6 units, 301-350= 8 units, 351-400= 10 units, if less than 70 or over 401 call md 03/10/20  Yes [provider]  memantine (NAMENDA) 10 MG tablet Take 10 mg by mouth 2 (two) times daily.   Yes [provider]  multivitamin (RENA-VIT) TABS tablet  Take 1 tablet by mouth daily.   Yes [provider]  niacin 100 MG tablet Take 200 mg by mouth every evening.   Yes [provider]  ondansetron (ZOFRAN-ODT) 4 MG disintegrating tablet Take 4 mg by mouth every 4 (four) hours as needed for nausea or vomiting.   Yes [provider]  [facility] VERIO test strip 1 each by Other route daily. 01/08/14  Yes [provider]  OVER THE COUNTER MEDICATION Take 1 Dose by mouth every evening. Yogurt for weight loss   Yes [provider]  OVER THE COUNTER MEDICATION Take 1 Dose by mouth See admin instructions. Nutritional treat with lunch Tuesday,Thursday, Saturday and sunday   Yes [provider]  polyethylene glycol (MIRALAX / GLYCOLAX) packet Take 17 g by mouth 2 (two) times daily. Patient taking differently: Take 17 g by mouth daily as needed for mild constipation. 03/30/18  Yes Hongalgi, Lenis Dickinson, MD  prednisoLONE acetate (PRED FORTE) 1 % ophthalmic suspension Place 1 drop into the left eye at bedtime. Patient taking differently: Place 1 drop into the left eye in the morning and at bedtime.  07/18/18  Yes Kc, Maren [place], MD  Red Yeast Rice 600 MG CAPS Take 1,200 mg by mouth at bedtime.   Yes [provider]  senna-docusate (SENOKOT-S) 8.6-50 MG tablet Take 2 tablets by mouth daily. Patient taking differently: Take 1 tablet by mouth 2 (two) times daily. 07/19/18  Yes Antonieta Pert, MD  Sodium Phosphates (FLEET ENEMA RE) Place 1 Dose rectally as needed (constipation).   Yes [provider]    Allergies    Bee venom, Lexiscan [regadenoson], Statins, and Ace inhibitors  Review of Systems   Review of Systems  Constitutional: Negative for chills and fever.  HENT: Negative for congestion.   Eyes: Negative for pain.  Respiratory: Negative for cough and shortness of breath.   Cardiovascular: Negative for chest pain and leg swelling.  Gastrointestinal: Positive for nausea and vomiting. Negative for abdominal pain.  Genitourinary: Negative for dysuria.  Musculoskeletal: Negative for myalgias.  Skin: Negative for rash.  Neurological: Positive for syncope and headaches. Negative for dizziness.    Physical Exam Updated Vital Signs BP (!) 92/46   Pulse 76   Temp 98.5 F (36.9 C) (Oral)   Resp 10   Ht '5\' 8"'$  (1.727 m)   Wt 89.8 kg   SpO2 100%   BMI 30.11 kg/m   Physical Exam Vitals and nursing note reviewed.  Constitutional:      General: He is not in acute distress.    Comments: Vomitus on clothing Reticent 70 year old male in no acute distress  HENT:     Head: Normocephalic and atraumatic.     Nose: Nose normal.  Eyes:     General: No scleral icterus. Cardiovascular:     Rate and Rhythm: Normal rate and regular rhythm.     Pulses: Normal pulses.     Heart sounds: Normal heart sounds.  Pulmonary:     Effort: Pulmonary effort is normal. No respiratory distress.     Breath sounds: No wheezing.  Abdominal:     Palpations: Abdomen is soft.     Tenderness: There is no abdominal tenderness. There is no guarding or rebound.  Musculoskeletal:     Cervical  back: Normal range of motion.     Right lower leg: No edema.     Left lower leg: No edema.  Skin:    General: Skin is warm and  dry.     Capillary Refill: Capillary refill takes less than 2 seconds.  Neurological:     Mental Status: He is alert. Mental status is at baseline.     Comments: Alert and oriented to self, place, time and event.   Speech is fluent, clear without dysarthria or dysphasia.   Strength 5/5 in upper/lower extremities  Sensation intact in upper/lower extremities   CN I not tested  CN II grossly intact visual fields bilaterally. Did not visualize posterior eye.   CN III, IV, VI PERRLA and EOMs intact bilaterally  CN V Intact sensation to sharp and light touch to the face  CN VII facial movements symmetric  CN VIII not tested  CN IX, X no uvula deviation, symmetric rise of soft palate  CN XI 5/5 SCM and trapezius strength bilaterally  CN XII Midline tongue protrusion, symmetric L/R movements   Psychiatric:        Mood and Affect: Mood normal.        Behavior: Behavior normal.     ED Results / Procedures / Treatments   Labs (all labs ordered are listed, but only abnormal results are displayed) Labs Reviewed  CBC WITH DIFFERENTIAL/PLATELET - Abnormal; Notable for the following components:      Result Value   RBC 3.47 (*)    Hemoglobin 10.5 (*)    HCT 32.8 (*)    Lymphs Abs 0.6 (*)    All other components within normal limits  COMPREHENSIVE METABOLIC PANEL - Abnormal; Notable for the following components:   Chloride 93 (*)    CO2 36 (*)    Glucose, Bld 241 (*)    Creatinine, Ser 4.96 (*)    GFR, Estimated 12 (*)    All other components within normal limits  LIPASE, BLOOD - Abnormal; Notable for the following components:   Lipase 101 (*)    All other components within normal limits  TROPONIN I (HIGH SENSITIVITY) - Abnormal; Notable for the following components:   Troponin I (High Sensitivity) 63 (*)    All other components within normal limits  SARS  CORONAVIRUS 2 (TAT 6-24 HRS)  MAGNESIUM  HIV ANTIBODY (ROUTINE TESTING W REFLEX)  BASIC METABOLIC PANEL  HEMOGLOBIN A1C  TROPONIN I (HIGH SENSITIVITY)    EKG None  Radiology DG Chest 2 View  Result Date: 10/08/2020 CLINICAL DATA:  Syncope. EXAM: CHEST - 2 VIEW COMPARISON:  March 08, 2020. FINDINGS: The heart size and mediastinal contours are within normal limits. Both lungs are clear. No pneumothorax or pleural effusion is noted. Right-sided pacemaker is unchanged in position. The visualized skeletal structures are unremarkable. IMPRESSION: No active cardiopulmonary disease. Electronically Signed   By: Marijo Conception M.D.   On: 10/08/2020 18:32   CT Head Wo Contrast  Result Date: 10/08/2020 CLINICAL DATA:  Syncope. EXAM: CT HEAD WITHOUT CONTRAST CT CERVICAL SPINE WITHOUT CONTRAST TECHNIQUE: Multidetector CT imaging of the head and cervical spine was performed following the standard protocol without intravenous contrast. Multiplanar CT image reconstructions of the cervical spine were also generated. COMPARISON:  July 22, 2018. FINDINGS: CT HEAD FINDINGS Brain: Mild diffuse cortical atrophy is noted. Mild chronic ischemic white matter disease is noted. No mass effect or midline shift is noted. Ventricular size is within normal limits. There is no evidence of mass lesion, hemorrhage or acute infarction. Vascular: No hyperdense vessel or unexpected calcification. Skull: Normal. Negative for fracture or focal lesion. Sinuses/Orbits: No acute finding. Other: None. CT CERVICAL SPINE FINDINGS Alignment: Normal.  Skull base and vertebrae: No acute fracture. No primary bone lesion or focal pathologic process. Soft tissues and spinal canal: No prevertebral fluid or swelling. No visible canal hematoma. Disc levels: Severe degenerative disc disease is noted at C6-7 and C7-T1. Upper chest: Negative. Other: None. IMPRESSION: No acute intracranial abnormality seen. Severe multilevel degenerative disc  disease is noted in the cervical spine. No acute abnormality is noted. Electronically Signed   By: Marijo Conception M.D.   On: 10/08/2020 18:09   CT Cervical Spine Wo Contrast  Result Date: 10/08/2020 CLINICAL DATA:  Syncope. EXAM: CT HEAD WITHOUT CONTRAST CT CERVICAL SPINE WITHOUT CONTRAST TECHNIQUE: Multidetector CT imaging of the head and cervical spine was performed following the standard protocol without intravenous contrast. Multiplanar CT image reconstructions of the cervical spine were also generated. COMPARISON:  July 22, 2018. FINDINGS: CT HEAD FINDINGS Brain: Mild diffuse cortical atrophy is noted. Mild chronic ischemic white matter disease is noted. No mass effect or midline shift is noted. Ventricular size is within normal limits. There is no evidence of mass lesion, hemorrhage or acute infarction. Vascular: No hyperdense vessel or unexpected calcification. Skull: Normal. Negative for fracture or focal lesion. Sinuses/Orbits: No acute finding. Other: None. CT CERVICAL SPINE FINDINGS Alignment: Normal. Skull base and vertebrae: No acute fracture. No primary bone lesion or focal pathologic process. Soft tissues and spinal canal: No prevertebral fluid or swelling. No visible canal hematoma. Disc levels: Severe degenerative disc disease is noted at C6-7 and C7-T1. Upper chest: Negative. Other: None. IMPRESSION: No acute intracranial abnormality seen. Severe multilevel degenerative disc disease is noted in the cervical spine. No acute abnormality is noted. Electronically Signed   By: Marijo Conception M.D.   On: 10/08/2020 18:09    Procedures Procedures   Medications Ordered in ED Medications  sodium chloride flush (NS) 0.9 % injection 3 mL (has no administration in time range)  heparin injection 5,000 Units (has no administration in time range)  acetaminophen (TYLENOL) tablet 650 mg (has no administration in time range)    Or  acetaminophen (TYLENOL) suppository 650 mg (has no administration  in time range)  ondansetron (ZOFRAN) tablet 4 mg (has no administration in time range)    Or  ondansetron (ZOFRAN) injection 4 mg (has no administration in time range)  aspirin chewable tablet 81 mg (has no administration in time range)  senna-docusate (Senokot-S) tablet 1 tablet (has no administration in time range)  prednisoLONE acetate (PRED FORTE) 1 % ophthalmic suspension 1 drop (has no administration in time range)  polyethylene glycol (MIRALAX / GLYCOLAX) packet 17 g (has no administration in time range)  niacin tablet 200 mg (has no administration in time range)  memantine (NAMENDA) tablet 10 mg (has no administration in time range)  multivitamin (RENA-VIT) tablet 1 tablet (has no administration in time range)  clopidogrel (PLAVIX) tablet 75 mg (has no administration in time range)  cyanocobalamin ((VITAMIN B-12)) injection 1,000 mcg (has no administration in time range)  dextrose (GLUTOSE) 40 % oral gel 37.5 g (has no administration in time range)  ezetimibe (ZETIA) tablet 10 mg (has no administration in time range)  finasteride (PROSCAR) tablet 5 mg (has no administration in time range)  folic acid (FOLVITE) tablet 1 mg (has no administration in time range)  bisacodyl (DULCOLAX) suppository 10 mg (has no administration in time range)  calcium acetate (PHOSLO) capsule 667 mg (has no administration in time range)  acetaminophen (TYLENOL) tablet 500 mg (has no administration in time range)  cholecalciferol (VITAMIN D3) tablet 2,000 Units (has no administration in time range)  insulin aspart (novoLOG) injection 0-9 Units (has no administration in time range)  potassium chloride SA (KLOR-CON) CR tablet 40 mEq (40 mEq Oral Given 10/08/20 2315)    ED Course  I have reviewed the triage vital signs and the nursing notes.  Pertinent labs & imaging results that were available during my care of the patient were reviewed by me and considered in my medical decision making (see chart for  details).  Patient is 70 year old male presented today after syncopal episode. Abnormal rhythm strip with wide-complex tachycardia questionable whether he has been paced during this episode.  Pacemaker was interrogated by bedside RN and found to have no episodes/abnormalities here. He does seem to have had episodes of lightheadedness and hypotension after dialysis.  He had dialysis this morning 1 hour before arrival.  Per EMS nursing of staff states that heart rate was 30 however EMS found her to be 70s/80s. Was brought urgently to the emergency room.   Clinical Course as of 10/09/20 0020  Wed Oct 08, 2020  1825 CT head / Cspine without acute abnormality.  Some degenerative changes noted. [WF]  2024 Chest x-ray unremarkable [WF]  2024 EKG without any evidence of ischemia.  No ST-T wave abnormalities.  No arrhythmia.  Cardiac monitor did not reveal any abnormalities. [WF]  2024 CMP notable for hyperglycemia.  No other significant abnormalities.  Creatinine approximately at baseline.  CBC for leukocytosis.  Consistent anemia.  Mild change from 1 year ago no previous records to compare to. [WF]    Clinical Course User Index [WF] Tedd Sias, [place]   Suspicion for arrhythmia given the abnormal rhythm strip versus hypotension and syncope and collapse from too much fluid removal from dialysis. Overall reassuring work-up.  Will admit to medicine with cardiology consulting.  MDM Rules/Calculators/A&P                          I discussed this case with my attending physician who cosigned this note including patient's presenting symptoms, physical exam, and planned diagnostics and interventions. Attending physician stated agreement with plan or made changes to plan which were implemented.   Attending physician assessed patient at bedside.   Patient agreeable to plan.  All questions answered best my ability.   Final Clinical Impression(s) / ED Diagnoses Final diagnoses:  Syncope, unspecified  syncope type    Rx / DC Orders ED Discharge Orders    None       Tedd Sias, [place] 10/09/20 [place], DO 10/09/20 1505

## 2020-10-08 NOTE — ED Notes (Signed)
Placed on bedside commode at this time. Sheets changed and bed made. Pt informed that he would need a PIV due to him being admitted. Pt verbalized understanding. Blue skid socks placed on pt at this time. Wife remains at bedside

## 2020-10-08 NOTE — ED Triage Notes (Signed)
[facility] EMS brought pt from [facility]. Pt went to HD today and had low bp. Pt returned to facility and had a syncopal episode, with one episode of vomiting. EMS reported hr in 30's bp 67 pal. Pt refused IV access. Pt pacer fired for 15 seconds and intermittently during transport. V/S 100/60 hr 80's cbg 157

## 2020-10-08 NOTE — ED Provider Notes (Incomplete)
[facility] EMERGENCY DEPARTMENT Provider Note   CSN: [id number] Arrival date & time: 10/08/20  1658     History Chief Complaint  Patient presents with  . Loss of Consciousness  . Emesis    Gregory Hood is a 70 y.o. male.  HPI        Past Medical History:  Diagnosis Date  . Chronic kidney disease   . COPD (chronic obstructive pulmonary disease) ([place])   . Diabetes mellitus without complication ([place])   . Diabetic retinopathy ([place])   . Hyperlipidemia   . Hypertension   . Obesity   . Orthostatic hypotension   . OSA on CPAP   . Retinal detachment   . Seizures ([place])   . Stroke ([place])   . TIA (transient ischemic attack)   . Umbilical hernia     Patient Active Problem List   Diagnosis Date Noted  . History of completed stroke 07/11/2018  . Acute encephalopathy 07/11/2018  . Cellulitis of right leg 07/11/2018  . Hypertensive urgency 07/11/2018  . Acute exacerbation of CHF (congestive heart failure) ([place]) 03/22/2018  . CVA (cerebral vascular accident) ([place]) 03/22/2018  . OSA (obstructive sleep apnea) 03/22/2018  . BPH (benign prostatic hyperplasia) 03/22/2018  . Thrombocytopenia ([place]) 03/22/2018  . Normocytic anemia 03/22/2018  . Metabolic acidosis [id number]  . Uremia 03/22/2018  . End stage renal disease ([place]) 03/14/2014  . Orthostatic hypotension 02/22/2014  . Elevated troponin 02/22/2014  . Chronic diastolic heart failure ([place]) 02/22/2014  . Elevated lipids 02/22/2014  . Essential hypertension, benign 02/17/2014  . DM (diabetes mellitus), type 2 with renal complications ([place]) [id number]  . Abnormal ECG 02/17/2014    Past Surgical History:  Procedure Laterality Date  . BASCILIC VEIN TRANSPOSITION Left 03/06/2015   Procedure: LET BASCILIC VEIN TRANSPOSITION;  Surgeon: Angelia Mould, MD;  Location: [facility];  Service: Vascular;  Laterality: Left;  . CIRCUMCISION    . EYE SURGERY Bilateral    had surgery several different times.  Marland Kitchen  HAMMER TOE SURGERY Left 30 years ago   small toe left toe  . HERNIA REPAIR    . IR AV DIALY SHUNT INTRO NEEDLE/INTRACATH INITIAL W/PTA/IMG LEFT  07/20/2018  . SEPTOPLASTY  20 years ago       Family History  Problem Relation Age of Onset  . Hypertension Mother   . Diabetes type II Mother   . Transient ischemic attack Mother   . Kidney disease Mother   . Diabetes Mother   . Varicose Veins Mother   . Hypertension Father   . Diabetes type II Father   . Diabetes Father   . Diabetes type II Sister   . Hypertension Sister   . Diabetes Sister   . Learning disabilities Maternal Uncle     Social History   Tobacco Use  . Smoking status: Former Smoker    Types: Cigarettes    Quit date: 03/05/1974    Years since quitting: 46.6  . Smokeless tobacco: Never Used  Vaping Use  . Vaping Use: Never used  Substance Use Topics  . Alcohol use: No    Alcohol/week: 0.0 standard drinks  . Drug use: No    Home Medications Prior to Admission medications   Medication Sig Start Date End Date Taking? Authorizing Provider  acetaminophen (TYLENOL) 500 MG tablet Take 500 mg by mouth daily.     [provider]  amLODipine (NORVASC) 10 MG tablet Take 1 tablet (10 mg total) by mouth daily. Patient  taking differently: Take 10 mg by mouth at bedtime.  03/31/18   Hongalgi, Lenis Dickinson, MD  aspirin 81 MG tablet Take 1 tablet (81 mg total) by mouth daily. Patient taking differently: Take 81 mg by mouth at bedtime.  03/30/18   Hongalgi, Lenis Dickinson, MD  carvedilol (COREG) 25 MG tablet Take 25 mg by mouth 2 (two) times daily. 01/31/18   [provider]  cholecalciferol 2000 units TABS Take 1 tablet (2,000 Units total) by mouth daily. 03/31/18   Hongalgi, Lenis Dickinson, MD  citalopram (CELEXA) 20 MG tablet Take 20 mg by mouth at bedtime.  03/29/19   [provider]  cloNIDine (CATAPRES) 0.3 MG tablet Take 0.3 mg by mouth at bedtime. 04/22/19   [provider]  clopidogrel (PLAVIX) 75 MG tablet  Take 75 mg by mouth daily. 03/10/18   [provider]  cyanocobalamin (,VITAMIN B-12,) 1000 MCG/ML injection Inject 1,000 mcg into the muscle every 30 (thirty) days.    [provider]  ezetimibe (ZETIA) 10 MG tablet Take 10 mg by mouth daily. 04/22/19   [provider]  finasteride (PROSCAR) 5 MG tablet Take 5 mg by mouth daily. 03/26/19   [provider]  folic acid (FOLVITE) 1 MG tablet Take 2 tablets (2 mg total) by mouth daily. Patient not taking: Reported on 04/23/2019 04/03/14   Josue Hector, MD  hydrALAZINE (APRESOLINE) 25 MG tablet Take 25 mg by mouth 3 (three) times daily. 04/07/19   [provider]  Roma Schanz test strip 1 each by Other route daily.  01/08/14   [provider]  polyethylene glycol (MIRALAX / GLYCOLAX) packet Take 17 g by mouth 2 (two) times daily. Patient not taking: Reported on 04/23/2019 03/30/18   Modena Jansky, MD  prednisoLONE acetate (PRED FORTE) 1 % ophthalmic suspension Place 1 drop into the left eye at bedtime. Patient not taking: Reported on 04/23/2019 07/18/18   Antonieta Pert, MD  QUEtiapine (SEROQUEL) 25 MG tablet Take 0.5 tablets (12.5 mg total) by mouth at bedtime. Patient not taking: Reported on 04/23/2019 07/18/18   Antonieta Pert, MD  Red Yeast Rice 600 MG CAPS Take 1 capsule by mouth daily.    [provider]  senna-docusate (SENOKOT-S) 8.6-50 MG tablet Take 2 tablets by mouth daily. Patient not taking: Reported on 04/23/2019 07/19/18   Antonieta Pert, MD    Allergies    Bee venom, Lexiscan [regadenoson], and Ace inhibitors  Review of Systems   Review of Systems  Physical Exam Updated Vital Signs There were no vitals taken for this visit.  Physical Exam  ED Results / Procedures / Treatments   Labs (all labs ordered are listed, but only abnormal results are displayed) Labs Reviewed - No data to display  EKG None  Radiology No results found.  Procedures Procedures {Remember to  document critical care time when appropriate:1}  Medications Ordered in ED Medications - No data to display  ED Course  I have reviewed the triage vital signs and the nursing notes.  Pertinent labs & imaging results that were available during my care of the patient were reviewed by me and considered in my medical decision making (see chart for details).  Clinical Course as of 10/08/20 2025  Wed Oct 08, 2020  1825 CT head / Cspine without acute abnormality.  Some degenerative changes noted. [WF]  2024 Chest x-ray unremarkable [WF]  2024 EKG without any evidence of ischemia.  No ST-T wave abnormalities.  No arrhythmia.  Cardiac monitor did not reveal any abnormalities. [WF]  2024 CMP notable for hyperglycemia.  No other significant abnormalities.  Creatinine approximately at baseline.  CBC for leukocytosis.  Consistent anemia.  Mild change from 1 year ago no previous records to compare to. [WF]    Clinical Course User Index [WF] Tedd Sias, [place]   Suspicion for arrhythmia given the abnormal rhythm strip versus hypotension and syncope and collapse from too much fluid removal from dialysis.  MDM Rules/Calculators/A&P                          *** Final Clinical Impression(s) / ED Diagnoses Final diagnoses:  None    Rx / DC Orders ED Discharge Orders    None

## 2020-10-08 NOTE — H&P (Signed)
History and Physical    Gregory Hood [id number] DOB: [birth year] DOA: 10/08/2020  PCP: Jilda Panda, MD  Patient coming from: LTC  I have personally briefly reviewed patient's old medical records in [facility]  Chief Complaint: Syncope  HPI: Gregory Hood is a 70 y.o. male with medical history significant of ESRD on dialysis (last done earlier today), DM2, HTN, prior stroke, alzheimer's.  Pt has h/o SSS s/p bivent PPM.  EF on echo in 2019 = 50.  EF according [facility] = 60.  PPM interrogation 3/24 = 1 run of 5 beats of NSVT.  Pt has been having reduced BPs recently so BP meds have been reduced.  Today after dialysis pt noted to be hypotensive and had syncope episode at his facility.  1 episode of vomiting.  EMS called, pt brought to ED.  Symptoms resolved at this time.  Pt denies CP, SOB.   ED Course: during EMS transport following rhythm strip noted:         77 beats of the first rhythm followed by spontaneous conversion to second rhythm.  First rhythm was at rate of 99 BPM.  Labs in ED: K 3.5.    Review of Systems: As per HPI, otherwise all review of systems negative.  Past Medical History:  Diagnosis Date  . Chronic kidney disease   . COPD (chronic obstructive pulmonary disease) ([place])   . Diabetes mellitus without complication ([place])   . Diabetic retinopathy ([place])   . Hyperlipidemia   . Hypertension   . Obesity   . Orthostatic hypotension   . OSA on CPAP   . Retinal detachment   . Seizures ([place])   . Stroke ([place])   . TIA (transient ischemic attack)   . Umbilical hernia     Past Surgical History:  Procedure Laterality Date  . BASCILIC VEIN TRANSPOSITION Left 03/06/2015   Procedure: LET BASCILIC VEIN TRANSPOSITION;  Surgeon: Angelia Mould, MD;  Location: [facility];  Service: Vascular;  Laterality: Left;  . CIRCUMCISION    . EYE SURGERY Bilateral    had surgery several different times.  Marland Kitchen HAMMER TOE SURGERY  Left 30 years ago   small toe left toe  . HERNIA REPAIR    . IR AV DIALY SHUNT INTRO NEEDLE/INTRACATH INITIAL W/PTA/IMG LEFT  07/20/2018  . SEPTOPLASTY  20 years ago     reports that he quit smoking about 46 years ago. His smoking use included cigarettes. He has never used smokeless tobacco. He reports that he does not drink alcohol and does not use drugs.  Allergies  Allergen Reactions  . Bee Venom Swelling  . Lexiscan [Regadenoson] Other (See Comments)    Seizure like-activity after lexiscan given.  . Statins     Other reaction(s): Other (See Comments) Myalgias and CK increase.  Tried both atorvastatin and rosuvastatin   . Ace Inhibitors Cough    Family History  Problem Relation Age of Onset  . Hypertension Mother   . Diabetes type II Mother   . Transient ischemic attack Mother   . Kidney disease Mother   . Diabetes Mother   . Varicose Veins Mother   . Hypertension Father   . Diabetes type II Father   . Diabetes Father   . Diabetes type II Sister   . Hypertension Sister   . Diabetes Sister   . Learning disabilities Maternal Uncle      Prior to Admission medications   Medication Sig Start  Date End Date Taking? Authorizing Provider  acetaminophen (TYLENOL) 500 MG tablet Take 500 mg by mouth daily.   Yes [provider]  aspirin 81 MG tablet Take 1 tablet (81 mg total) by mouth daily. Patient taking differently: Take 81 mg by mouth at bedtime. 03/30/18  Yes Hongalgi, Lenis Dickinson, MD  bisacodyl (DULCOLAX) 10 MG suppository Place 10 mg rectally as needed for moderate constipation.   Yes [provider]  calcium acetate (PHOSLO) 667 MG capsule Take 667 mg by mouth 3 (three) times daily. 08/29/20  Yes [provider]  carvedilol (COREG) 3.125 MG tablet Take 6.25 mg by mouth See admin instructions. 2 tabs in the morning on Tuesday,thursday,saturday and Sunday. Take 2 tabs every evening. 08/29/20  Yes [provider]  carvedilol (COREG) 6.25 MG tablet  Take 6.25 mg by mouth See admin instructions. Take 1 tab hs on Tuesday,Thursday and saturday   Yes [provider]  cholecalciferol 2000 units TABS Take 1 tablet (2,000 Units total) by mouth daily. 03/31/18  Yes Hongalgi, Lenis Dickinson, MD  clopidogrel (PLAVIX) 75 MG tablet Take 75 mg by mouth daily. 03/10/18  Yes [provider]  cyanocobalamin (,VITAMIN B-12,) 1000 MCG/ML injection Inject 1,000 mcg into the muscle every 30 (thirty) days. On the 26th   Yes [provider]  Dextrose, Diabetic Use, (GLUCOSE) 77.4 % GEL Take 1 Dose by mouth as needed (blood glucose less than 70).   Yes [provider]  EPINEPHrine (EPIPEN 2-PAK IJ) Inject 0.3 mg into the muscle as needed (anaphyylaxis).   Yes [provider]  ezetimibe (ZETIA) 10 MG tablet Take 10 mg by mouth every evening. 04/22/19  Yes [provider]  finasteride (PROSCAR) 5 MG tablet Take 5 mg by mouth daily. 03/26/19  Yes [provider]  folic acid (FOLVITE) 1 MG tablet Take 2 tablets (2 mg total) by mouth daily. Patient taking differently: Take 1 mg by mouth daily. 04/03/14  Yes Josue Hector, MD  Glucagon, rDNA, (GLUCAGON EMERGENCY IJ) Inject 1 mg into the muscle as needed (blood sugar less than 70).   Yes [provider]  hydrALAZINE (APRESOLINE) 25 MG tablet Take 25 mg by mouth See admin instructions. Monday,Wednesday and Friday at bedtime. 04/07/19  Yes [provider]  insulin lispro (HUMALOG) 100 UNIT/ML injection Inject 2-10 Units into the skin See admin instructions. Bid per sliding scale Less than 150= 0 units, 151-200= 2 units, 201-250=4 units, 251-300= 6 units, 301-350= 8 units, 351-400= 10 units, if less than 70 or over 401 call md 03/10/20  Yes [provider]  memantine (NAMENDA) 10 MG tablet Take 10 mg by mouth 2 (two) times daily.   Yes [provider]  multivitamin (RENA-VIT) TABS tablet Take 1 tablet by mouth daily.   Yes [provider]  niacin 100 MG tablet Take 200 mg by mouth every evening.   Yes [provider]  ondansetron (ZOFRAN-ODT) 4 MG disintegrating tablet Take 4 mg by mouth every 4 (four) hours as needed for nausea or vomiting.   Yes [provider]  [facility] VERIO test strip 1 each by Other route daily. 01/08/14  Yes [provider]  OVER THE COUNTER MEDICATION Take 1 Dose by mouth every evening. Yogurt for weight loss   Yes [provider]  OVER THE COUNTER MEDICATION Take 1 Dose by mouth See admin instructions. Nutritional treat with lunch Tuesday,Thursday, Saturday and sunday   Yes [provider]  polyethylene glycol (MIRALAX /  GLYCOLAX) packet Take 17 g by mouth 2 (two) times daily. Patient taking differently: Take 17 g by mouth daily as needed for mild constipation. 03/30/18  Yes Hongalgi, Lenis Dickinson, MD  prednisoLONE acetate (PRED FORTE) 1 % ophthalmic suspension Place 1 drop into the left eye at bedtime. Patient taking differently: Place 1 drop into the left eye in the morning and at bedtime. 07/18/18  Yes Kc, Maren Beach, MD  Red Yeast Rice 600 MG CAPS Take 1,200 mg by mouth at bedtime.   Yes [provider]  senna-docusate (SENOKOT-S) 8.6-50 MG tablet Take 2 tablets by mouth daily. Patient taking differently: Take 1 tablet by mouth 2 (two) times daily. 07/19/18  Yes Antonieta Pert, MD  Sodium Phosphates (FLEET ENEMA RE) Place 1 Dose rectally as needed (constipation).   Yes [provider]    Physical Exam: Vitals:   10/08/20 2130 10/08/20 2245 10/08/20 2300 10/08/20 2305  BP: (!) 122/57 115/61 (!) 92/46   Pulse: 74 77 76   Resp: '10 14 10   '$ Temp:    98.5 F (36.9 C)  TempSrc:    Oral  SpO2: 100% 100% 100%   Weight:      Height:        Constitutional: NAD, calm, comfortable Eyes: PERRL, lids and conjunctivae normal ENMT: Mucous membranes are moist. Posterior pharynx clear of any exudate or lesions.Normal dentition.  Neck: normal,  supple, no masses, no thyromegaly Respiratory: clear to auscultation bilaterally, no wheezing, no crackles. Normal respiratory effort. No accessory muscle use.  Cardiovascular: Regular rate and rhythm, no murmurs / rubs / gallops. No extremity edema. 2+ pedal pulses. No carotid bruits.  Abdomen: no tenderness, no masses palpated. No hepatosplenomegaly. Bowel sounds positive.  Musculoskeletal: no clubbing / cyanosis. No joint deformity upper and lower extremities. Good ROM, no contractures. Normal muscle tone.  Skin: no rashes, lesions, ulcers. No induration Neurologic: CN 2-12 grossly intact. Sensation intact, DTR normal. Strength 5/5 in all 4.  Psychiatric: Normal judgment and insight. Alert and oriented x 3. Normal mood.    Labs on Admission: I have personally reviewed following labs and imaging studies  CBC: Recent Labs  Lab 10/08/20 1723  WBC 4.7  NEUTROABS 3.3  HGB 10.5*  HCT 32.8*  MCV 94.5  PLT [id number]   Basic Metabolic Panel: Recent Labs  Lab 10/08/20 1723 10/08/20 1914  NA 136  --   K 3.5  --   CL 93*  --   CO2 36*  --   GLUCOSE 241*  --   BUN 17  --   CREATININE 4.96*  --   CALCIUM 9.1  --   MG  --  2.3   GFR: Estimated Creatinine Clearance: 15.5 mL/min (A) (by C-G formula based on SCr of 4.96 mg/dL (H)). Liver Function Tests: Recent Labs  Lab 10/08/20 1723  AST 31  ALT 24  ALKPHOS 82  BILITOT 0.7  PROT 7.3  ALBUMIN 3.6   Recent Labs  Lab 10/08/20 1723  LIPASE 101*   No results for input(s): AMMONIA in the last 168 hours. Coagulation Profile: No results for input(s): INR, PROTIME in the last 168 hours. Cardiac Enzymes: No results for input(s): CKTOTAL, CKMB, CKMBINDEX, TROPONINI in the last 168 hours. BNP (last 3 results) No results for input(s): PROBNP in the last 8760 hours. HbA1C: No results for input(s): HGBA1C in the last 72 hours. CBG: No results for input(s): GLUCAP in the last 168 hours. Lipid Profile: No results for input(s): CHOL,  HDL,  LDLCALC, TRIG, CHOLHDL, LDLDIRECT in the last 72 hours. Thyroid Function Tests: No results for input(s): TSH, T4TOTAL, FREET4, T3FREE, THYROIDAB in the last 72 hours. Anemia Panel: No results for input(s): VITAMINB12, FOLATE, FERRITIN, TIBC, IRON, RETICCTPCT in the last 72 hours. Urine analysis:    Component Value Date/Time   COLORURINE YELLOW 09/24/2014 0216   APPEARANCEUR CLOUDY (A) 09/24/2014 0216   LABSPEC 1.018 09/24/2014 0216   PHURINE 5.0 09/24/2014 0216   GLUCOSEU NEGATIVE 09/24/2014 0216   HGBUR NEGATIVE 09/24/2014 0216   BILIRUBINUR NEGATIVE 09/24/2014 0216   KETONESUR NEGATIVE 09/24/2014 0216   PROTEINUR >300 (A) 09/24/2014 0216   UROBILINOGEN 0.2 09/24/2014 0216   NITRITE NEGATIVE 09/24/2014 0216   LEUKOCYTESUR NEGATIVE 09/24/2014 0216    Radiological Exams on Admission: DG Chest 2 View  Result Date: 10/08/2020 CLINICAL DATA:  Syncope. EXAM: CHEST - 2 VIEW COMPARISON:  March 08, 2020. FINDINGS: The heart size and mediastinal contours are within normal limits. Both lungs are clear. No pneumothorax or pleural effusion is noted. Right-sided pacemaker is unchanged in position. The visualized skeletal structures are unremarkable. IMPRESSION: No active cardiopulmonary disease. Electronically Signed   By: Marijo Conception M.D.   On: 10/08/2020 18:32   CT Head Wo Contrast  Result Date: 10/08/2020 CLINICAL DATA:  Syncope. EXAM: CT HEAD WITHOUT CONTRAST CT CERVICAL SPINE WITHOUT CONTRAST TECHNIQUE: Multidetector CT imaging of the head and cervical spine was performed following the standard protocol without intravenous contrast. Multiplanar CT image reconstructions of the cervical spine were also generated. COMPARISON:  July 22, 2018. FINDINGS: CT HEAD FINDINGS Brain: Mild diffuse cortical atrophy is noted. Mild chronic ischemic white matter disease is noted. No mass effect or midline shift is noted. Ventricular size is within normal limits. There is no evidence of mass  lesion, hemorrhage or acute infarction. Vascular: No hyperdense vessel or unexpected calcification. Skull: Normal. Negative for fracture or focal lesion. Sinuses/Orbits: No acute finding. Other: None. CT CERVICAL SPINE FINDINGS Alignment: Normal. Skull base and vertebrae: No acute fracture. No primary bone lesion or focal pathologic process. Soft tissues and spinal canal: No prevertebral fluid or swelling. No visible canal hematoma. Disc levels: Severe degenerative disc disease is noted at C6-7 and C7-T1. Upper chest: Negative. Other: None. IMPRESSION: No acute intracranial abnormality seen. Severe multilevel degenerative disc disease is noted in the cervical spine. No acute abnormality is noted. Electronically Signed   By: Marijo Conception M.D.   On: 10/08/2020 18:09   CT Cervical Spine Wo Contrast  Result Date: 10/08/2020 CLINICAL DATA:  Syncope. EXAM: CT HEAD WITHOUT CONTRAST CT CERVICAL SPINE WITHOUT CONTRAST TECHNIQUE: Multidetector CT imaging of the head and cervical spine was performed following the standard protocol without intravenous contrast. Multiplanar CT image reconstructions of the cervical spine were also generated. COMPARISON:  July 22, 2018. FINDINGS: CT HEAD FINDINGS Brain: Mild diffuse cortical atrophy is noted. Mild chronic ischemic white matter disease is noted. No mass effect or midline shift is noted. Ventricular size is within normal limits. There is no evidence of mass lesion, hemorrhage or acute infarction. Vascular: No hyperdense vessel or unexpected calcification. Skull: Normal. Negative for fracture or focal lesion. Sinuses/Orbits: No acute finding. Other: None. CT CERVICAL SPINE FINDINGS Alignment: Normal. Skull base and vertebrae: No acute fracture. No primary bone lesion or focal pathologic process. Soft tissues and spinal canal: No prevertebral fluid or swelling. No visible canal hematoma. Disc levels: Severe degenerative disc disease is noted at C6-7 and C7-T1. Upper chest:  Negative. Other: None.  IMPRESSION: No acute intracranial abnormality seen. Severe multilevel degenerative disc disease is noted in the cervical spine. No acute abnormality is noted. Electronically Signed   By: Marijo Conception M.D.   On: 10/08/2020 18:09    EKG: Independently reviewed.  Assessment/Plan Principal Problem:   Syncope Active Problems:   Essential hypertension, benign   DM (diabetes mellitus), type 2 with renal complications (HCC)   Orthostatic hypotension   Chronic diastolic heart failure (HCC)   End stage renal disease (HCC)   Wide-complex tachycardia ([place])    1. Syncope - 1. Syncope pathway 2. Tele monitor 3. 2d echo 4. Hold all anti-hypertensive, as it seems he has been getting post dialysis hypotension more and more recently. 5. Dont know that he needs midodrine yet though. 6. See discussion below regarding EKG findings. 2. Wide-complex tachycardia on EKG rhythm strip with EMS - 1. VT vs SVT with aberrancy vs ventricular pacing (though rate of 99 seems kinda fast for a PPM to be pacing at, its supposed to pace at 70 from what this interrogation says). 2. Spoke with Dr. Paticia Stack: 1. Replace K to 4.0 2. Keep on tele monitor 3. Cards will consult 4. No antiarrythmic drugs at this time 3. Interrogation performed as well 3. ESRD - 1. Call nephro if pt not discharged tomorrow 2. Repeat BMP in AM 4. DM2 - 1. Sensitive SSI AC  DVT prophylaxis: Heparin [place] Code Status: Full Family Communication: Wife at bedside Disposition Plan: LTC after syncope work up Consults called: Dr. Paticia Stack with cardiology Admission status: Place in [place] [place] Hospitalists  How to contact the [facility] Attending or Consulting provider [place] or covering provider during after hours [place], for this patient?  1. Check the care team in [facility] and look for a) attending/consulting [facility] provider listed and b) the [facility] team listed 2. Log into [email or web address]  Amion Physician  Scheduling and messaging for groups and whole hospitals  On call and physician scheduling software for group practices, residents, hospitalists and other medical providers for call, clinic, rotation and shift schedules. On[employer] is a hospital-wide system for scheduling doctors and paging doctors on call. EasyPlot is for scientific plotting and data analysis.  [email or web address]  and use [facility]'s universal password to access. If you do not have the password, please contact the hospital operator.  3. Locate the [facility] provider you are looking for under [facility] and page to a number that you can be directly reached. 4. If you still have difficulty reaching the provider, please page the [facility] (Director on Call) for the Hospitalists listed on amion for assistance.  10/08/2020, 11:37 PM

## 2020-10-08 NOTE — ED Notes (Signed)
Received verbal report from [place]

## 2020-10-08 NOTE — Consult Note (Signed)
[facility] Consult Note   Primary Physician: Jilda Panda, MD Primary Cardiologist:  None listed  Reason for Consultation: Syncope  HPI:    Gregory Hood is a 70 yo male with a PMHx of ESRD on HD MWF, SSS s/p PPM (dual chamber [employer] device), chronic diastolic CHF, HTN, DM, prior CVA, chronic lower extremity venous stasis, episodes of cellulitis/sores on the legs, Alzheimer dementia and legal blindness, who is admitted to the hospital after a syncopal event. Patient brought in by EMS from long-term care facility. Patient had HD today (with low BP recorded). Then had a syncopal episode and also vomited. EMS reported HR in the 30s and brief run of wide complex rhythm.  In the ED, vitals were: BP 108/59 mmHg, HR 76 bpm, O2 sat 100% and RR 13.  CT head showed mild diffuse cortical atrophy. Mild chronic ischemic white matter disease noted. No mass effect or midline shift noted. Ventricular size within normal limits. No evidence of mass lesion, hemorrhage or acute infarction.  Labs were: Na 136, K 3.5, Mg 2.3, Gluc 241, BUN 17, Cr 4.96, hs-Trop 63, Hgb 10.5, Hct 32.8 and Plt 173.  EMS ECG (10/08/2020) showed sinus rhythm, rate 79 bpm, slight ST depressions in inferolateral leads.    Previous testing: Echocardiogram 03/23/2018 - Left ventricle: The cavity size was normal. There was moderate  concentric hypertrophy. Systolic function was normal. The  estimated ejection fraction was in the range of 55% to 60%. Wall  motion was normal; there were no regional wall motion  abnormalities. Features are consistent with a pseudonormal left  ventricular filling pattern, with concomitant abnormal relaxation  and increased filling pressure (grade 2 diastolic dysfunction).  Doppler parameters are consistent with elevated ventricular  end-diastolic filling pressure.  - Mitral valve: Calcified annulus. Moderately thickened leaflets .  There was moderate regurgitation.  - Left  atrium: The atrium was moderately dilated.  - Right atrium: The atrium was severely dilated.  - Tricuspid valve: There was moderate regurgitation.  - Pulmonary arteries: Systolic pressure was moderately increased.  PA peak pressure: 60 mm Hg (S).  - Pericardium, extracardiac: A mild circumferential pericardial  effusion was identified. There was a large left pleural effusion.     Home Medications Prior to Admission medications   Medication Sig Start Date End Date Taking? Authorizing Provider  acetaminophen (TYLENOL) 500 MG tablet Take 500 mg by mouth daily.   Yes [provider]  aspirin 81 MG tablet Take 1 tablet (81 mg total) by mouth daily. Patient taking differently: Take 81 mg by mouth at bedtime. 03/30/18  Yes Hongalgi, Lenis Dickinson, MD  bisacodyl (DULCOLAX) 10 MG suppository Place 10 mg rectally as needed for moderate constipation.   Yes [provider]  calcium acetate (PHOSLO) 667 MG capsule Take 667 mg by mouth 3 (three) times daily. 08/29/20  Yes [provider]  carvedilol (COREG) 3.125 MG tablet Take 6.25 mg by mouth See admin instructions. 2 tabs in the morning on Tuesday,thursday,saturday and Sunday. Take 2 tabs every evening. 08/29/20  Yes [provider]  carvedilol (COREG) 6.25 MG tablet Take 6.25 mg by mouth See admin instructions. Take 1 tab hs on Tuesday,Thursday and saturday   Yes [provider]  cholecalciferol 2000 units TABS Take 1 tablet (2,000 Units total) by mouth daily. 03/31/18  Yes Hongalgi, Lenis Dickinson, MD  clopidogrel (PLAVIX) 75 MG tablet Take 75 mg by mouth daily. 03/10/18  Yes [provider]  cyanocobalamin (,VITAMIN B-12,) 1000  MCG/ML injection Inject 1,000 mcg into the muscle every 30 (thirty) days. On the 26th   Yes [provider]  Dextrose, Diabetic Use, (GLUCOSE) 77.4 % GEL Take 1 Dose by mouth as needed (blood glucose less than 70).   Yes [provider]  EPINEPHrine (EPIPEN 2-PAK IJ)  Inject 0.3 mg into the muscle as needed (anaphyylaxis).   Yes [provider]  ezetimibe (ZETIA) 10 MG tablet Take 10 mg by mouth every evening. 04/22/19  Yes [provider]  finasteride (PROSCAR) 5 MG tablet Take 5 mg by mouth daily. 03/26/19  Yes [provider]  folic acid (FOLVITE) 1 MG tablet Take 2 tablets (2 mg total) by mouth daily. Patient taking differently: Take 1 mg by mouth daily. 04/03/14  Yes Josue Hector, MD  Glucagon, rDNA, (GLUCAGON EMERGENCY IJ) Inject 1 mg into the muscle as needed (blood sugar less than 70).   Yes [provider]  hydrALAZINE (APRESOLINE) 25 MG tablet Take 25 mg by mouth See admin instructions. Monday,Wednesday and Friday at bedtime. 04/07/19  Yes [provider]  insulin lispro (HUMALOG) 100 UNIT/ML injection Inject 2-10 Units into the skin See admin instructions. Bid per sliding scale Less than 150= 0 units, 151-200= 2 units, 201-250=4 units, 251-300= 6 units, 301-350= 8 units, 351-400= 10 units, if less than 70 or over 401 call md 03/10/20  Yes [provider]  memantine (NAMENDA) 10 MG tablet Take 10 mg by mouth 2 (two) times daily.   Yes [provider]  multivitamin (RENA-VIT) TABS tablet Take 1 tablet by mouth daily.   Yes [provider]  niacin 100 MG tablet Take 200 mg by mouth every evening.   Yes [provider]  ondansetron (ZOFRAN-ODT) 4 MG disintegrating tablet Take 4 mg by mouth every 4 (four) hours as needed for nausea or vomiting.   Yes [provider]  [facility] VERIO test strip 1 each by Other route daily. 01/08/14  Yes [provider]  OVER THE COUNTER MEDICATION Take 1 Dose by mouth every evening. Yogurt for weight loss   Yes [provider]  OVER THE COUNTER MEDICATION Take 1 Dose by mouth See admin instructions. Nutritional treat with lunch Tuesday,Thursday, Saturday and sunday   Yes [provider]  polyethylene glycol  (MIRALAX / GLYCOLAX) packet Take 17 g by mouth 2 (two) times daily. Patient taking differently: Take 17 g by mouth daily as needed for mild constipation. 03/30/18  Yes Hongalgi, Lenis Dickinson, MD  prednisoLONE acetate (PRED FORTE) 1 % ophthalmic suspension Place 1 drop into the left eye at bedtime. Patient taking differently: Place 1 drop into the left eye in the morning and at bedtime. 07/18/18  Yes Kc, Maren Beach, MD  Red Yeast Rice 600 MG CAPS Take 1,200 mg by mouth at bedtime.   Yes [provider]  senna-docusate (SENOKOT-S) 8.6-50 MG tablet Take 2 tablets by mouth daily. Patient taking differently: Take 1 tablet by mouth 2 (two) times daily. 07/19/18  Yes Antonieta Pert, MD  Sodium Phosphates (FLEET ENEMA RE) Place 1 Dose rectally as needed (constipation).   Yes [provider]    Past Medical History: Past Medical History:  Diagnosis Date  . Chronic kidney disease   . COPD (chronic obstructive pulmonary disease) ([place])   . Diabetes mellitus without complication ([place])   . Diabetic retinopathy ([place])   . Hyperlipidemia   . Hypertension   . Obesity   . Orthostatic hypotension   . OSA on  CPAP   . Retinal detachment   . Seizures ([place])   . Stroke ([place])   . TIA (transient ischemic attack)   . Umbilical hernia     Past Surgical History: Past Surgical History:  Procedure Laterality Date  . BASCILIC VEIN TRANSPOSITION Left 03/06/2015   Procedure: LET BASCILIC VEIN TRANSPOSITION;  Surgeon: Angelia Mould, MD;  Location: [place];  Service: Vascular;  Laterality: Left;  . CIRCUMCISION    . EYE SURGERY Bilateral    had surgery several different times.  Marland Kitchen HAMMER TOE SURGERY Left 30 years ago   small toe left toe  . HERNIA REPAIR    . IR AV DIALY SHUNT INTRO NEEDLE/INTRACATH INITIAL W/PTA/IMG LEFT  07/20/2018  . SEPTOPLASTY  20 years ago    Family History: Family History  Problem Relation Age of Onset  . Hypertension Mother   . Diabetes type II Mother   . Transient ischemic  attack Mother   . Kidney disease Mother   . Diabetes Mother   . Varicose Veins Mother   . Hypertension Father   . Diabetes type II Father   . Diabetes Father   . Diabetes type II Sister   . Hypertension Sister   . Diabetes Sister   . Learning disabilities Maternal Uncle     Social History: Social History   Socioeconomic History  . Marital status: Married    Spouse name: Not on file  . Number of children: Not on file  . Years of education: Not on file  . Highest education level: Not on file  Occupational History  . Not on file  Tobacco Use  . Smoking status: Former Smoker    Types: Cigarettes    Quit date: 03/05/1974    Years since quitting: 46.6  . Smokeless tobacco: Never Used  Vaping Use  . Vaping Use: Never used  Substance and Sexual Activity  . Alcohol use: No    Alcohol/week: 0.0 standard drinks  . Drug use: No  . Sexual activity: Not Currently  Other Topics Concern  . Not on file  Social History Narrative  . Not on file   Social Determinants of Health   Financial Resource Strain: Not on file  Food Insecurity: Not on file  Transportation Needs: Not on file  Physical Activity: Not on file  Stress: Not on file  Social Connections: Not on file    Allergies:  Allergies  Allergen Reactions  . Bee Venom Swelling  . Lexiscan [Regadenoson] Other (See Comments)    Seizure like-activity after lexiscan given.  . Statins     Other reaction(s): Other (See Comments) Myalgias and CK increase.  Tried both atorvastatin and rosuvastatin   . Ace Inhibitors Cough     Review of Systems: [y] = yes, '[ ]'$  = no   . General: Weight gain '[ ]'$ ; Weight loss '[ ]'$ ; Anorexia '[ ]'$ ; Fatigue '[ ]'$ ; Fever '[ ]'$ ; Chills '[ ]'$ ; Weakness '[ ]'$   . Cardiac: Chest pain/pressure '[ ]'$ ; Resting SOB '[ ]'$ ; Exertional SOB '[ ]'$ ; Orthopnea '[ ]'$ ; Pedal Edema '[ ]'$ ; Palpitations '[ ]'$ ; Syncope [Y]; Presyncope '[ ]'$ ; Paroxysmal nocturnal dyspnea'[ ]'$   . Pulmonary: Cough '[ ]'$ ; Wheezing'[ ]'$ ; Hemoptysis'[ ]'$ ; Sputum '[ ]'$ ;  Snoring '[ ]'$   . GI: Vomiting'[ ]'$ ; Dysphagia'[ ]'$ ; Melena'[ ]'$ ; Hematochezia '[ ]'$ ; Heartburn'[ ]'$ ; Abdominal pain '[ ]'$ ; Constipation '[ ]'$ ; Diarrhea '[ ]'$ ; BRBPR '[ ]'$   . GU: Hematuria'[ ]'$ ; Dysuria '[ ]'$ ; Nocturia'[ ]'$   . Vascular: Pain in  legs with walking '[ ]'$ ; Pain in feet with lying flat '[ ]'$ ; Non-healing sores '[ ]'$ ; Stroke '[ ]'$ ; TIA '[ ]'$ ; Slurred speech '[ ]'$ ;  . Neuro: Headaches'[ ]'$ ; Vertigo'[ ]'$ ; Seizures'[ ]'$ ; Paresthesias'[ ]'$ ;Blurred vision '[ ]'$ ; Diplopia '[ ]'$ ; Vision changes '[ ]'$   . Ortho/Skin: Arthritis '[ ]'$ ; Joint pain '[ ]'$ ; Muscle pain '[ ]'$ ; Joint swelling '[ ]'$ ; Back Pain '[ ]'$ ; Rash '[ ]'$   . Psych: Depression'[ ]'$ ; Anxiety'[ ]'$   . Heme: Bleeding problems '[ ]'$ ; Clotting disorders '[ ]'$ ; Anemia '[ ]'$   . Endocrine: Diabetes '[ ]'$ ; Thyroid dysfunction'[ ]'$      Objective:    Vital Signs:   Temp:  [98.4 F (36.9 C)-98.5 F (36.9 C)] 98.5 F (36.9 C) (04/13 2305) Pulse Rate:  [74-78] 76 (04/13 2300) Resp:  [10-14] 10 (04/13 2300) BP: (92-139)/(46-77) 92/46 (04/13 2300) SpO2:  [100 %] 100 % (04/13 2300) Weight:  [89.8 kg] 89.8 kg (04/13 1715)    Weight change: Filed Weights   10/08/20 1715  Weight: 89.8 kg    Intake/Output:  No intake or output data in the 24 hours ending 10/08/20 2335    Physical Exam    General:  Comfortable HEENT: PERRL, EOMI Neck: No JVD Cor: RRR, normal S1 and S2 Lungs: CTA bilaterally Abdomen: Soft, not distended, BS positive Extremities: No edema Neuro: Normal  Affect: Pleasant    Labs   Basic Metabolic Panel: Recent Labs  Lab 10/08/20 1723 10/08/20 1914  NA 136  --   K 3.5  --   CL 93*  --   CO2 36*  --   GLUCOSE 241*  --   BUN 17  --   CREATININE 4.96*  --   CALCIUM 9.1  --   MG  --  2.3    Liver Function Tests: Recent Labs  Lab 10/08/20 1723  AST 31  ALT 24  ALKPHOS 82  BILITOT 0.7  PROT 7.3  ALBUMIN 3.6   Recent Labs  Lab 10/08/20 1723  LIPASE 101*   No results for input(s): AMMONIA in the last 168 hours.  CBC: Recent Labs  Lab 10/08/20 1723  WBC 4.7   NEUTROABS 3.3  HGB 10.5*  HCT 32.8*  MCV 94.5  PLT 173    Cardiac Enzymes: No results for input(s): CKTOTAL, CKMB, CKMBINDEX, TROPONINI in the last 168 hours.  BNP: BNP (last 3 results) No results for input(s): BNP in the last 8760 hours.  ProBNP (last 3 results) No results for input(s): PROBNP in the last 8760 hours.   CBG: No results for input(s): GLUCAP in the last 168 hours.  Coagulation Studies: No results for input(s): LABPROT, INR in the last 72 hours.   Imaging   DG Chest 2 View  Result Date: 10/08/2020 CLINICAL DATA:  Syncope. EXAM: CHEST - 2 VIEW COMPARISON:  March 08, 2020. FINDINGS: The heart size and mediastinal contours are within normal limits. Both lungs are clear. No pneumothorax or pleural effusion is noted. Right-sided pacemaker is unchanged in position. The visualized skeletal structures are unremarkable. IMPRESSION: No active cardiopulmonary disease. Electronically Signed   By: Marijo Conception M.D.   On: 10/08/2020 18:32   CT Head Wo Contrast  Result Date: 10/08/2020 CLINICAL DATA:  Syncope. EXAM: CT HEAD WITHOUT CONTRAST CT CERVICAL SPINE WITHOUT CONTRAST TECHNIQUE: Multidetector CT imaging of the head and cervical spine was performed following the standard protocol without intravenous contrast. Multiplanar CT image reconstructions of the cervical spine were also generated. COMPARISON:  July 22, 2018. FINDINGS: CT HEAD FINDINGS Brain: Mild diffuse cortical atrophy is noted. Mild chronic ischemic white matter disease is noted. No mass effect or midline shift is noted. Ventricular size is within normal limits. There is no evidence of mass lesion, hemorrhage or acute infarction. Vascular: No hyperdense vessel or unexpected calcification. Skull: Normal. Negative for fracture or focal lesion. Sinuses/Orbits: No acute finding. Other: None. CT CERVICAL SPINE FINDINGS Alignment: Normal. Skull base and vertebrae: No acute fracture. No primary bone lesion or  focal pathologic process. Soft tissues and spinal canal: No prevertebral fluid or swelling. No visible canal hematoma. Disc levels: Severe degenerative disc disease is noted at C6-7 and C7-T1. Upper chest: Negative. Other: None. IMPRESSION: No acute intracranial abnormality seen. Severe multilevel degenerative disc disease is noted in the cervical spine. No acute abnormality is noted. Electronically Signed   By: Marijo Conception M.D.   On: 10/08/2020 18:09   CT Cervical Spine Wo Contrast  Result Date: 10/08/2020 CLINICAL DATA:  Syncope. EXAM: CT HEAD WITHOUT CONTRAST CT CERVICAL SPINE WITHOUT CONTRAST TECHNIQUE: Multidetector CT imaging of the head and cervical spine was performed following the standard protocol without intravenous contrast. Multiplanar CT image reconstructions of the cervical spine were also generated. COMPARISON:  July 22, 2018. FINDINGS: CT HEAD FINDINGS Brain: Mild diffuse cortical atrophy is noted. Mild chronic ischemic white matter disease is noted. No mass effect or midline shift is noted. Ventricular size is within normal limits. There is no evidence of mass lesion, hemorrhage or acute infarction. Vascular: No hyperdense vessel or unexpected calcification. Skull: Normal. Negative for fracture or focal lesion. Sinuses/Orbits: No acute finding. Other: None. CT CERVICAL SPINE FINDINGS Alignment: Normal. Skull base and vertebrae: No acute fracture. No primary bone lesion or focal pathologic process. Soft tissues and spinal canal: No prevertebral fluid or swelling. No visible canal hematoma. Disc levels: Severe degenerative disc disease is noted at C6-7 and C7-T1. Upper chest: Negative. Other: None. IMPRESSION: No acute intracranial abnormality seen. Severe multilevel degenerative disc disease is noted in the cervical spine. No acute abnormality is noted. Electronically Signed   By: Marijo Conception M.D.   On: 10/08/2020 18:09      Medications:     Current Medications: . [START ON  10/09/2020] acetaminophen  500 mg Oral Daily  . aspirin  81 mg Oral QHS  . calcium acetate  667 mg Oral TID  . [START ON 10/09/2020] Cholecalciferol  2,000 Units Oral Daily  . [START ON 10/09/2020] clopidogrel  75 mg Oral Daily  . cyanocobalamin  1,000 mcg Intramuscular Q30 days  . ezetimibe  10 mg Oral QPM  . [START ON 10/09/2020] finasteride  5 mg Oral Daily  . [START ON 123456 folic acid  1 mg Oral Daily  . heparin  5,000 Units Subcutaneous Q8H  . [START ON 10/09/2020] insulin aspart  0-9 Units Subcutaneous TID WC  . memantine  10 mg Oral BID  . [START ON 10/09/2020] multivitamin  1 tablet Oral Daily  . niacin  200 mg Oral QPM  . prednisoLONE acetate  1 drop Left Eye BID  . Red Yeast Rice  1,200 mg Oral QHS  . senna-docusate  1 tablet Oral BID  . sodium chloride flush  3 mL Intravenous Q12H     Infusions:     Assessment/Plan   1. Syncope The patient has had issues with significant hypotension post hemodialysis. Today after HD he had a syncopal episode. EMS reported HR in the 30s but there  is no printed tracing of this. The patient has a dual chamber pacemaker. Initial interrogation reveals normal function.  Brief run of Wide complex rhythm (approx 60 beats) - SVT with aberrancy vs VT vs paced rhythm (copy of tracing in EMR)  -Observe on telemetry -Maintain K >4.0 and Mg > 2.0 -Hold antihypertensives -Transthoracic echo in AM to assess LV function  Initial pacer interrogration; battery life 5 yrs, normal lead impedances, lower rate 70 bpm, 1 VT event (7 secs) in March 2022. No recent events recorded   Meade Maw, MD  10/08/2020, 11:35 PM  Cardiology Overnight Team Please contact [facility] for night-coverage after hours (4p -7a ) and weekends on [email or web address]

## 2020-10-09 ENCOUNTER — Observation Stay (HOSPITAL_COMMUNITY): Payer: Medicare Other

## 2020-10-09 DIAGNOSIS — I132 Hypertensive heart and chronic kidney disease with heart failure and with stage 5 chronic kidney disease, or end stage renal disease: Secondary | ICD-10-CM | POA: Diagnosis present

## 2020-10-09 DIAGNOSIS — I472 Ventricular tachycardia: Secondary | ICD-10-CM

## 2020-10-09 DIAGNOSIS — I495 Sick sinus syndrome: Secondary | ICD-10-CM | POA: Diagnosis not present

## 2020-10-09 DIAGNOSIS — G4733 Obstructive sleep apnea (adult) (pediatric): Secondary | ICD-10-CM | POA: Diagnosis present

## 2020-10-09 DIAGNOSIS — R55 Syncope and collapse: Secondary | ICD-10-CM

## 2020-10-09 DIAGNOSIS — I272 Pulmonary hypertension, unspecified: Secondary | ICD-10-CM | POA: Diagnosis present

## 2020-10-09 DIAGNOSIS — N2581 Secondary hyperparathyroidism of renal origin: Secondary | ICD-10-CM | POA: Diagnosis present

## 2020-10-09 DIAGNOSIS — I739 Peripheral vascular disease, unspecified: Secondary | ICD-10-CM | POA: Diagnosis not present

## 2020-10-09 DIAGNOSIS — E785 Hyperlipidemia, unspecified: Secondary | ICD-10-CM | POA: Diagnosis present

## 2020-10-09 DIAGNOSIS — E11319 Type 2 diabetes mellitus with unspecified diabetic retinopathy without macular edema: Secondary | ICD-10-CM | POA: Diagnosis present

## 2020-10-09 DIAGNOSIS — D649 Anemia, unspecified: Secondary | ICD-10-CM | POA: Diagnosis present

## 2020-10-09 DIAGNOSIS — G309 Alzheimer's disease, unspecified: Secondary | ICD-10-CM | POA: Diagnosis present

## 2020-10-09 DIAGNOSIS — J449 Chronic obstructive pulmonary disease, unspecified: Secondary | ICD-10-CM | POA: Diagnosis present

## 2020-10-09 DIAGNOSIS — H548 Legal blindness, as defined in USA: Secondary | ICD-10-CM | POA: Diagnosis present

## 2020-10-09 DIAGNOSIS — I10 Essential (primary) hypertension: Secondary | ICD-10-CM

## 2020-10-09 DIAGNOSIS — Z992 Dependence on renal dialysis: Secondary | ICD-10-CM | POA: Diagnosis not present

## 2020-10-09 DIAGNOSIS — Z20822 Contact with and (suspected) exposure to covid-19: Secondary | ICD-10-CM | POA: Diagnosis present

## 2020-10-09 DIAGNOSIS — E669 Obesity, unspecified: Secondary | ICD-10-CM | POA: Diagnosis present

## 2020-10-09 DIAGNOSIS — I712 Thoracic aortic aneurysm, without rupture: Secondary | ICD-10-CM | POA: Diagnosis present

## 2020-10-09 DIAGNOSIS — F0280 Dementia in other diseases classified elsewhere without behavioral disturbance: Secondary | ICD-10-CM | POA: Diagnosis present

## 2020-10-09 DIAGNOSIS — I5032 Chronic diastolic (congestive) heart failure: Secondary | ICD-10-CM | POA: Diagnosis not present

## 2020-10-09 DIAGNOSIS — Z8673 Personal history of transient ischemic attack (TIA), and cerebral infarction without residual deficits: Secondary | ICD-10-CM | POA: Diagnosis not present

## 2020-10-09 DIAGNOSIS — N186 End stage renal disease: Secondary | ICD-10-CM | POA: Diagnosis not present

## 2020-10-09 DIAGNOSIS — I878 Other specified disorders of veins: Secondary | ICD-10-CM | POA: Diagnosis present

## 2020-10-09 DIAGNOSIS — E8889 Other specified metabolic disorders: Secondary | ICD-10-CM | POA: Diagnosis present

## 2020-10-09 DIAGNOSIS — I953 Hypotension of hemodialysis: Secondary | ICD-10-CM | POA: Diagnosis present

## 2020-10-09 DIAGNOSIS — E1122 Type 2 diabetes mellitus with diabetic chronic kidney disease: Secondary | ICD-10-CM | POA: Diagnosis not present

## 2020-10-09 DIAGNOSIS — Z6827 Body mass index (BMI) 27.0-27.9, adult: Secondary | ICD-10-CM | POA: Diagnosis not present

## 2020-10-09 LAB — HEMOGLOBIN A1C
Hgb A1c MFr Bld: 6.1 % — ABNORMAL HIGH (ref 4.8–5.6)
Mean Plasma Glucose: 128.37 mg/dL

## 2020-10-09 LAB — GLUCOSE, CAPILLARY
Glucose-Capillary: 110 mg/dL — ABNORMAL HIGH (ref 70–99)
Glucose-Capillary: 126 mg/dL — ABNORMAL HIGH (ref 70–99)
Glucose-Capillary: 193 mg/dL — ABNORMAL HIGH (ref 70–99)
Glucose-Capillary: 82 mg/dL (ref 70–99)

## 2020-10-09 LAB — ECHOCARDIOGRAM COMPLETE
Area-P 1/2: 2.45 cm2
Height: 70 in
S' Lateral: 2.7 cm
Weight: 3030 oz

## 2020-10-09 LAB — BASIC METABOLIC PANEL
Anion gap: 9 (ref 5–15)
BUN: 25 mg/dL — ABNORMAL HIGH (ref 8–23)
CO2: 32 mmol/L (ref 22–32)
Calcium: 9.1 mg/dL (ref 8.9–10.3)
Chloride: 96 mmol/L — ABNORMAL LOW (ref 98–111)
Creatinine, Ser: 6.01 mg/dL — ABNORMAL HIGH (ref 0.61–1.24)
GFR, Estimated: 10 mL/min — ABNORMAL LOW (ref >60)
Glucose, Bld: 109 mg/dL — ABNORMAL HIGH (ref 70–99)
Potassium: 4.1 mmol/L (ref 3.5–5.1)
Sodium: 137 mmol/L (ref 135–145)

## 2020-10-09 LAB — TROPONIN I (HIGH SENSITIVITY): Troponin I (High Sensitivity): 66 ng/L — ABNORMAL HIGH (ref <18)

## 2020-10-09 LAB — HIV ANTIBODY (ROUTINE TESTING W REFLEX): HIV Screen 4th Generation wRfx: NONREACTIVE

## 2020-10-09 LAB — SARS CORONAVIRUS 2 (TAT 6-24 HRS): SARS Coronavirus 2: NEGATIVE

## 2020-10-09 LAB — MRSA PCR SCREENING: MRSA by PCR: NEGATIVE

## 2020-10-09 MED ORDER — CHLORHEXIDINE GLUCONATE CLOTH 2 % EX PADS
6.0000 | MEDICATED_PAD | Freq: Every day | CUTANEOUS | Status: DC
  Administered 2020-10-11: 6 via TOPICAL

## 2020-10-09 MED ORDER — NEPRO/CARBSTEADY PO LIQD
237.0000 mL | Freq: Two times a day (BID) | ORAL | Status: DC
  Administered 2020-10-09 – 2020-10-11 (×4): 237 mL via ORAL

## 2020-10-09 MED ORDER — NEPRO/CARBSTEADY PO LIQD
237.0000 mL | Freq: Two times a day (BID) | ORAL | Status: DC
  Administered 2020-10-09: 237 mL via ORAL

## 2020-10-09 MED ORDER — PROSOURCE PLUS PO LIQD
30.0000 mL | Freq: Two times a day (BID) | ORAL | Status: DC
  Administered 2020-10-09 – 2020-10-11 (×4): 30 mL via ORAL
  Filled 2020-10-09 (×4): qty 30

## 2020-10-09 MED ORDER — CARVEDILOL 3.125 MG PO TABS
3.1250 mg | ORAL_TABLET | Freq: Two times a day (BID) | ORAL | Status: DC
  Administered 2020-10-09 – 2020-10-11 (×3): 3.125 mg via ORAL
  Filled 2020-10-09 (×4): qty 1

## 2020-10-09 NOTE — Consult Note (Signed)
[facility] Renal Consultation Note    Indication for Consultation:  Management of ESRD/hemodialysis, anemia, hypertension/volume, and secondary hyperparathyroidism. PCP:  HPI: Gregory Hood is a 70 y.o. male with ESRD, HTN, Hx sick sinus syndrome s/p dual-chamber PPM, Hx CVA, dementia, COPD, T2DM, and OSA who was admitted for syncopal episode.   Mr. Lichtenwalner had his full dialysis yesterday. After getting back to his SNF, he was eating a sandwich and then doesn't remember after that but had syncopal episode. He denies prodromal dizziness, CP, dyspnea, diaphoresis, fever, chills, N/V, or diarrhea. Brought to ED via EMS who reported HR down to 30's with wide complex rhythm on rhythm strip during the ride with pacemaker firing intermittently during the ride. In the ED, vital had improved, normal HR, BP, and afebrile. Intake labs showed K 3.5, Glu 241, Ca 9.1, WBC 4.7, Hgb 10.5, Trop 63 -> 66. CXR and head CT without acute abnormalities.   Cardiology was consulted, plan to hold hydralazine/coreg, observe on telemetry, and get echo. Pacemaker interrogation without obvious abnormalities.  Seen in room - appears comfortable. No new symptoms. No CP, dyspnea, N/V/D, fever.   Dialyzes on MWF schedule at [facility] He is compliant with his treatments, but reports that he hates going to dialysis. Wife is concerned about ongoing weight loss (he reports close to 100lbs over the past few years). Seems like has a good appetite. Says the breakfast at his SNF is "dynamite" but the lunch and dinner options are not good. He dislikes having to miss lunch while at dialysis (d/t COVID and requirement of patients wearing masks for full treatment, eating while on the machine is not allowed).   Past Medical History:  Diagnosis Date  . Chronic kidney disease   . COPD (chronic obstructive pulmonary disease) ([place])   . Diabetes mellitus without complication ([place])   . Diabetic retinopathy ([place])   .  Hyperlipidemia   . Hypertension   . Obesity   . Orthostatic hypotension   . OSA on CPAP   . Retinal detachment   . Seizures ([place])   . Stroke ([place])   . TIA (transient ischemic attack)   . Umbilical hernia    Past Surgical History:  Procedure Laterality Date  . BASCILIC VEIN TRANSPOSITION Left 03/06/2015   Procedure: LET BASCILIC VEIN TRANSPOSITION;  Surgeon: Angelia Mould, MD;  Location: [facility];  Service: Vascular;  Laterality: Left;  . CIRCUMCISION    . EYE SURGERY Bilateral    had surgery several different times.  Marland Kitchen HAMMER TOE SURGERY Left 30 years ago   small toe left toe  . HERNIA REPAIR    . IR AV DIALY SHUNT INTRO NEEDLE/INTRACATH INITIAL W/PTA/IMG LEFT  07/20/2018  . SEPTOPLASTY  20 years ago   Family History  Problem Relation Age of Onset  . Hypertension Mother   . Diabetes type II Mother   . Transient ischemic attack Mother   . Kidney disease Mother   . Diabetes Mother   . Varicose Veins Mother   . Hypertension Father   . Diabetes type II Father   . Diabetes Father   . Diabetes type II Sister   . Hypertension Sister   . Diabetes Sister   . Learning disabilities Maternal Uncle    Social History:  reports that he quit smoking about 46 years ago. His smoking use included cigarettes. He has never used smokeless tobacco. He reports that he does not drink alcohol and does not use drugs.  ROS: As  per HPI otherwise negative.  Physical Exam: Vitals:   10/09/20 0745 10/09/20 0800 10/09/20 0917 10/09/20 0936  BP: (!) 171/82 (!) 159/94 (!) 146/82   Pulse: 76 75 77   Resp: (!) '9 10 18   '$ Temp:  98.9 F (37.2 C) 98.4 F (36.9 C)   TempSrc:   Oral   SpO2: 100% 100% 100%   Weight:    85.9 kg  Height:    '5\' 10"'$  (1.778 m)     General: Well developed, well nourished, in no acute distress. Room air. Head: Normocephalic, atraumatic, sclera non-icteric, mucus membranes are moist. Neck: Supple without lymphadenopathy/masses. JVD not elevated. Lungs: Clear bilaterally  to auscultation without wheezes, rales, or rhonchi. Breathing is unlabored. Heart: RRR with normal S1, S2. No murmurs, rubs, or gallops appreciated. Abdomen: Soft, non-tender, non-distended with normoactive bowel sounds.  Musculoskeletal: Reduced muscle tone in B legs. Lower extremities: No edema or ischemic changes, no open wounds. Neuro: Alert and oriented X 3. Moves all extremities spontaneously. Psych:  Responds to questions appropriately with a normal affect. Dialysis Access: LUE AVG + bruit  Allergies  Allergen Reactions  . Bee Venom Swelling  . Lexiscan [Regadenoson] Other (See Comments)    Seizure like-activity after lexiscan given.  . Statins     Other reaction(s): Other (See Comments) Myalgias and CK increase.  Tried both atorvastatin and rosuvastatin   . Ace Inhibitors Cough   Prior to Admission medications   Medication Sig Start Date End Date Taking? Authorizing Provider  acetaminophen (TYLENOL) 500 MG tablet Take 500 mg by mouth daily.   Yes [provider]  aspirin 81 MG tablet Take 1 tablet (81 mg total) by mouth daily. Patient taking differently: Take 81 mg by mouth at bedtime. 03/30/18  Yes Hongalgi, Lenis Dickinson, MD  bisacodyl (DULCOLAX) 10 MG suppository Place 10 mg rectally as needed for moderate constipation.   Yes [provider]  calcium acetate (PHOSLO) 667 MG capsule Take 667 mg by mouth 3 (three) times daily. 08/29/20  Yes [provider]  carvedilol (COREG) 3.125 MG tablet Take 6.25 mg by mouth See admin instructions. 2 tabs in the morning on Tuesday,thursday,saturday and Sunday. Take 2 tabs every evening. 08/29/20  Yes [provider]  carvedilol (COREG) 6.25 MG tablet Take 6.25 mg by mouth See admin instructions. Take 1 tab hs on Tuesday,Thursday and saturday   Yes [provider]  cholecalciferol 2000 units TABS Take 1 tablet (2,000 Units total) by mouth daily. 03/31/18  Yes Hongalgi, Lenis Dickinson, MD  clopidogrel (PLAVIX) 75 MG  tablet Take 75 mg by mouth daily. 03/10/18  Yes [provider]  cyanocobalamin (,VITAMIN B-12,) 1000 MCG/ML injection Inject 1,000 mcg into the muscle every 30 (thirty) days. On the 26th   Yes [provider]  Dextrose, Diabetic Use, (GLUCOSE) 77.4 % GEL Take 1 Dose by mouth as needed (blood glucose less than 70).   Yes [provider]  EPINEPHrine (EPIPEN 2-PAK IJ) Inject 0.3 mg into the muscle as needed (anaphyylaxis).   Yes [provider]  ezetimibe (ZETIA) 10 MG tablet Take 10 mg by mouth every evening. 04/22/19  Yes [provider]  finasteride (PROSCAR) 5 MG tablet Take 5 mg by mouth daily. 03/26/19  Yes [provider]  folic acid (FOLVITE) 1 MG tablet Take 2 tablets (2 mg total) by mouth daily. Patient taking differently: Take 1 mg by mouth daily. 04/03/14  Yes Josue Hector, MD  Glucagon, rDNA, (GLUCAGON EMERGENCY IJ)  Inject 1 mg into the muscle as needed (blood sugar less than 70).   Yes [provider]  hydrALAZINE (APRESOLINE) 25 MG tablet Take 25 mg by mouth See admin instructions. Monday,Wednesday and Friday at bedtime. 04/07/19  Yes [provider]  insulin lispro (HUMALOG) 100 UNIT/ML injection Inject 2-10 Units into the skin See admin instructions. Bid per sliding scale Less than 150= 0 units, 151-200= 2 units, 201-250=4 units, 251-300= 6 units, 301-350= 8 units, 351-400= 10 units, if less than 70 or over 401 call md 03/10/20  Yes [provider]  memantine (NAMENDA) 10 MG tablet Take 10 mg by mouth 2 (two) times daily.   Yes [provider]  multivitamin (RENA-VIT) TABS tablet Take 1 tablet by mouth daily.   Yes [provider]  niacin 100 MG tablet Take 200 mg by mouth every evening.   Yes [provider]  ondansetron (ZOFRAN-ODT) 4 MG disintegrating tablet Take 4 mg by mouth every 4 (four) hours as needed for nausea or vomiting.   Yes [provider]  [facility] VERIO  test strip 1 each by Other route daily. 01/08/14  Yes [provider]  OVER THE COUNTER MEDICATION Take 1 Dose by mouth every evening. Yogurt for weight loss   Yes [provider]  OVER THE COUNTER MEDICATION Take 1 Dose by mouth See admin instructions. Nutritional treat with lunch Tuesday,Thursday, Saturday and sunday   Yes [provider]  polyethylene glycol (MIRALAX / GLYCOLAX) packet Take 17 g by mouth 2 (two) times daily. Patient taking differently: Take 17 g by mouth daily as needed for mild constipation. 03/30/18  Yes Hongalgi, Lenis Dickinson, MD  prednisoLONE acetate (PRED FORTE) 1 % ophthalmic suspension Place 1 drop into the left eye at bedtime. Patient taking differently: Place 1 drop into the left eye in the morning and at bedtime. 07/18/18  Yes Kc, Maren Beach, MD  Red Yeast Rice 600 MG CAPS Take 1,200 mg by mouth at bedtime.   Yes [provider]  senna-docusate (SENOKOT-S) 8.6-50 MG tablet Take 2 tablets by mouth daily. Patient taking differently: Take 1 tablet by mouth 2 (two) times daily. 07/19/18  Yes Antonieta Pert, MD  Sodium Phosphates (FLEET ENEMA RE) Place 1 Dose rectally as needed (constipation).   Yes [provider]   Current Facility-Administered Medications  Medication Dose Route Frequency Provider Last Rate Last Admin  . acetaminophen (TYLENOL) tablet 650 mg  650 mg Oral Q6H PRN Etta Quill, DO       Or  . acetaminophen (TYLENOL) suppository 650 mg  650 mg Rectal Q6H PRN Etta Quill, DO      . acetaminophen (TYLENOL) tablet 500 mg  500 mg Oral Daily Jennette Kettle M, DO   500 mg at 10/09/20 1025  . aspirin chewable tablet 81 mg  81 mg Oral QHS Jennette Kettle M, DO   81 mg at 10/09/20 0049  . bisacodyl (DULCOLAX) suppository 10 mg  10 mg Rectal PRN Etta Quill, DO      . calcium acetate (PHOSLO) capsule 667 mg  667 mg Oral TID Benjamin Stain, DO   667 mg at 10/09/20 1023  . cholecalciferol (VITAMIN D3) tablet 2,000 Units   2,000 Units Oral Daily Etta Quill, DO   2,000 Units at 10/09/20 1026  . clopidogrel (PLAVIX) tablet 75 mg  75 mg Oral Daily Jennette Kettle M, DO   75 mg at 10/09/20 1026  . [START ON 10/21/2020] cyanocobalamin ((  VITAMIN B-12)) injection 1,000 mcg  1,000 mcg Intramuscular Q30 days Etta Quill, DO      . dextrose (GLUTOSE) 40 % oral gel 37.5 g  1 Tube Oral PRN Etta Quill, DO      . ezetimibe (ZETIA) tablet 10 mg  10 mg Oral QPM Jennette Kettle M, DO   10 mg at 10/09/20 0050  . feeding supplement (NEPRO CARB STEADY) liquid 237 mL  237 mL Oral BID BM Rizwan, Saima, MD   237 mL at 10/09/20 1022  . finasteride (PROSCAR) tablet 5 mg  5 mg Oral Daily Jennette Kettle M, DO   5 mg at 10/09/20 1024  . folic acid (FOLVITE) tablet 1 mg  1 mg Oral Daily Jennette Kettle M, DO   1 mg at 10/09/20 1025  . heparin injection 5,000 Units  5,000 Units Subcutaneous Q8H Jennette Kettle M, DO   5,000 Units at 10/09/20 0051  . insulin aspart (novoLOG) injection 0-9 Units  0-9 Units Subcutaneous TID WC Etta Quill, DO      . memantine [facility]) tablet 10 mg  10 mg Oral BID Jennette Kettle M, DO   10 mg at 10/09/20 1024  . multivitamin (RENA-VIT) tablet 1 tablet  1 tablet Oral QHS Jennette Kettle M, DO      . niacin tablet 200 mg  200 mg Oral QPM Jennette Kettle M, DO   200 mg at 10/09/20 0049  . ondansetron (ZOFRAN) tablet 4 mg  4 mg Oral Q6H PRN Etta Quill, DO       Or  . ondansetron [facility]) injection 4 mg  4 mg Intravenous Q6H PRN Etta Quill, DO      . polyethylene glycol (MIRALAX / GLYCOLAX) packet 17 g  17 g Oral Daily PRN Etta Quill, DO      . prednisoLONE acetate (PRED FORTE) 1 % ophthalmic suspension 1 drop  1 drop Left Eye BID Jennette Kettle M, DO   1 drop at 10/09/20 0050  . senna-docusate (Senokot-S) tablet 1 tablet  1 tablet Oral BID Etta Quill, DO   1 tablet at 10/09/20 1025  . sodium chloride flush (NS) 0.9 % injection 3 mL  3 mL Intravenous Q12H Jennette Kettle M, DO   3  mL at 10/09/20 0806   Labs: Basic Metabolic Panel: Recent Labs  Lab 10/08/20 1723  NA 136  K 3.5  CL 93*  CO2 36*  GLUCOSE 241*  BUN 17  CREATININE 4.96*  CALCIUM 9.1   Liver Function Tests: Recent Labs  Lab 10/08/20 1723  AST 31  ALT 24  ALKPHOS 82  BILITOT 0.7  PROT 7.3  ALBUMIN 3.6   Recent Labs  Lab 10/08/20 1723  LIPASE 101*   CBC: Recent Labs  Lab 10/08/20 1723  WBC 4.7  NEUTROABS 3.3  HGB 10.5*  HCT 32.8*  MCV 94.5  PLT 173   Studies/Results: DG Chest 2 View  Result Date: 10/08/2020 CLINICAL DATA:  Syncope. EXAM: CHEST - 2 VIEW COMPARISON:  March 08, 2020. FINDINGS: The heart size and mediastinal contours are within normal limits. Both lungs are clear. No pneumothorax or pleural effusion is noted. Right-sided pacemaker is unchanged in position. The visualized skeletal structures are unremarkable. IMPRESSION: No active cardiopulmonary disease. Electronically Signed   By: Marijo Conception M.D.   On: 10/08/2020 18:32   CT Head Wo Contrast  Result Date: 10/08/2020 CLINICAL DATA:  Syncope. EXAM: CT HEAD WITHOUT CONTRAST CT CERVICAL SPINE  WITHOUT CONTRAST TECHNIQUE: Multidetector CT imaging of the head and cervical spine was performed following the standard protocol without intravenous contrast. Multiplanar CT image reconstructions of the cervical spine were also generated. COMPARISON:  July 22, 2018. FINDINGS: CT HEAD FINDINGS Brain: Mild diffuse cortical atrophy is noted. Mild chronic ischemic white matter disease is noted. No mass effect or midline shift is noted. Ventricular size is within normal limits. There is no evidence of mass lesion, hemorrhage or acute infarction. Vascular: No hyperdense vessel or unexpected calcification. Skull: Normal. Negative for fracture or focal lesion. Sinuses/Orbits: No acute finding. Other: None. CT CERVICAL SPINE FINDINGS Alignment: Normal. Skull base and vertebrae: No acute fracture. No primary bone lesion or focal  pathologic process. Soft tissues and spinal canal: No prevertebral fluid or swelling. No visible canal hematoma. Disc levels: Severe degenerative disc disease is noted at C6-7 and C7-T1. Upper chest: Negative. Other: None. IMPRESSION: No acute intracranial abnormality seen. Severe multilevel degenerative disc disease is noted in the cervical spine. No acute abnormality is noted. Electronically Signed   By: Marijo Conception M.D.   On: 10/08/2020 18:09   CT Cervical Spine Wo Contrast  Result Date: 10/08/2020 CLINICAL DATA:  Syncope. EXAM: CT HEAD WITHOUT CONTRAST CT CERVICAL SPINE WITHOUT CONTRAST TECHNIQUE: Multidetector CT imaging of the head and cervical spine was performed following the standard protocol without intravenous contrast. Multiplanar CT image reconstructions of the cervical spine were also generated. COMPARISON:  July 22, 2018. FINDINGS: CT HEAD FINDINGS Brain: Mild diffuse cortical atrophy is noted. Mild chronic ischemic white matter disease is noted. No mass effect or midline shift is noted. Ventricular size is within normal limits. There is no evidence of mass lesion, hemorrhage or acute infarction. Vascular: No hyperdense vessel or unexpected calcification. Skull: Normal. Negative for fracture or focal lesion. Sinuses/Orbits: No acute finding. Other: None. CT CERVICAL SPINE FINDINGS Alignment: Normal. Skull base and vertebrae: No acute fracture. No primary bone lesion or focal pathologic process. Soft tissues and spinal canal: No prevertebral fluid or swelling. No visible canal hematoma. Disc levels: Severe degenerative disc disease is noted at C6-7 and C7-T1. Upper chest: Negative. Other: None. IMPRESSION: No acute intracranial abnormality seen. Severe multilevel degenerative disc disease is noted in the cervical spine. No acute abnormality is noted. Electronically Signed   By: Marijo Conception M.D.   On: 10/08/2020 18:09   Dialysis Orders:  MWF at [facility] 4hr, 250dialyzer, 450/500,  EDW 87kg, 2K/2Ca, no heparin, UFP #2 - Venofer '50mg'$  IV/week - Mircera 82mg IV q 4 weeks - Calcitriol 1.518m PO q HD  Assessment/Plan: 1.  Hypotension/syncope: Arrhythmia v. hypotension. Echo pending. Cardiology on board. Coreg/hydralazine on hold. Will raise his dry weight with dialysis to see if helps. 2.  ESRD: Continue HD per MWF schedule - next HD on 4/15, no heparin. 3.  Hypotension/volume: As above, holding BP meds and will ^ EDW 1kg (to 88kg) 4.  Anemia: Hgb 10.5 - no ESA for now. 5.  Metabolic bone disease: Ca ok, Phos pending. Continue home meds. 6.  Nutrition: Alb 3.6 - fine. Appetite ok, but having ongoing weight loss.  7.  T2DM: A1C 6.1%  8.  Hx sick sinus syndrome s/p dual chamber pacemaker 9.  Hx CVA 10.  Dementia  KaVeneta PentonPAHershal Coria/14/2022, 10:38 AM  [facility]

## 2020-10-09 NOTE — Progress Notes (Signed)
  Echocardiogram 2D Echocardiogram has been performed.  Gregory Hood 10/09/2020, 4:38 PM

## 2020-10-09 NOTE — Progress Notes (Signed)
Patient refused CPAP for the night. Sp02=100% at this time.

## 2020-10-09 NOTE — Progress Notes (Addendum)
Progress Note  Patient Name: Gregory Hood Date of Encounter: 10/09/2020  [facility] Cardiologist: Follows at [facility]  Subjective   Patient states he is feeling well, denied any chest pain, heart palpitation, SOB, dizziness. He does not remember the syncope episode. He went to dialysis yesterday and returned to SNF and had meals.  He remember eating some Doritos prior to passing out.  He denied any dizziness, chest pain, shortness of breath surrounding the event.  Patient's wife is at bedside, reports that staff noted patient's blood pressure and heart rate were low after dialysis yesterday, and he also notes of vomiting prior to syncope.  Wife states patient used to have frequent syncope before pacemaker implantation, syncope episode has resolved after his pacing rate increased to 70s.  Patient denies any fever, chills, abdominal pain, diarrhea, is oliguric at baseline.    Inpatient Medications    Scheduled Meds:  acetaminophen  500 mg Oral Daily   aspirin  81 mg Oral QHS   calcium acetate  667 mg Oral TID AC   cholecalciferol  2,000 Units Oral Daily   clopidogrel  75 mg Oral Daily   [START ON 10/21/2020] cyanocobalamin  1,000 mcg Intramuscular Q30 days   ezetimibe  10 mg Oral QPM   finasteride  5 mg Oral Daily   folic acid  1 mg Oral Daily   heparin  5,000 Units Subcutaneous Q8H   insulin aspart  0-9 Units Subcutaneous TID WC   memantine  10 mg Oral BID   multivitamin  1 tablet Oral QHS   niacin  200 mg Oral QPM   prednisoLONE acetate  1 drop Left Eye BID   senna-docusate  1 tablet Oral BID   sodium chloride flush  3 mL Intravenous Q12H   Continuous Infusions:   PRN Meds: acetaminophen **OR** acetaminophen, bisacodyl, dextrose, ondansetron **OR** ondansetron (ZOFRAN) IV, polyethylene glycol   Vital Signs    Vitals:   10/09/20 0730 10/09/20 0745 10/09/20 0800 10/09/20 0917  BP: (!) 161/86 (!) 171/82 (!) 159/94 (!) 146/82  Pulse: 77 76 75 77  Resp: 12 (!) '9 10  18  '$ Temp:   98.9 F (37.2 C) 98.4 F (36.9 C)  TempSrc:    Oral  SpO2: 100% 100% 100% 100%  Weight:      Height:       No intake or output data in the 24 hours ending 10/09/20 0930 Last 3 Weights 10/08/2020 04/23/2019 04/03/2019  Weight (lbs) 198 lb 200 lb 200 lb  Weight (kg) 89.812 kg 90.719 kg 90.719 kg      Telemetry    Paced rhythm - Personally Reviewed  ECG    No new tracing - Personally Reviewed  Physical Exam   GEN: No acute distress. Left eye ptosis and legally blind  Neck: No JVD Cardiac: Irregular rate and rhythm, grade III systolic murmurs 2nd LSB  Respiratory: Clear to auscultation bilaterally. GI: Soft, nontender, non-distended  MS: No BLE edema; No deformity. Neuro:  Nonfocal  Psych: Normal affect  LUE AV fistula with drssing in place, + thrill and bruit.  Right infraclavicular device pocket without swelling, erythema, tenderness.    Labs    High Sensitivity Troponin:   Recent Labs  Lab 10/08/20 1914 10/09/20 0105  TROPONINIHS 63* 66*      Chemistry Recent Labs  Lab 10/08/20 1723  NA 136  K 3.5  CL 93*  CO2 36*  GLUCOSE 241*  BUN 17  CREATININE 4.96*  CALCIUM  9.1  PROT 7.3  ALBUMIN 3.6  AST 31  ALT 24  ALKPHOS 82  BILITOT 0.7  GFRNONAA 12*  ANIONGAP 7     Hematology Recent Labs  Lab 10/08/20 1723  WBC 4.7  RBC 3.47*  HGB 10.5*  HCT 32.8*  MCV 94.5  MCH 30.3  MCHC 32.0  RDW 12.8  PLT 173    BNPNo results for input(s): BNP, PROBNP in the last 168 hours.   DDimer No results for input(s): DDIMER in the last 168 hours.   Radiology    DG Chest 2 View  Result Date: 10/08/2020 CLINICAL DATA:  Syncope. EXAM: CHEST - 2 VIEW COMPARISON:  March 08, 2020. FINDINGS: The heart size and mediastinal contours are within normal limits. Both lungs are clear. No pneumothorax or pleural effusion is noted. Right-sided pacemaker is unchanged in position. The visualized skeletal structures are unremarkable. IMPRESSION: No active  cardiopulmonary disease. Electronically Signed   By: Marijo Conception M.D.   On: 10/08/2020 18:32   CT Head Wo Contrast  Result Date: 10/08/2020 CLINICAL DATA:  Syncope. EXAM: CT HEAD WITHOUT CONTRAST CT CERVICAL SPINE WITHOUT CONTRAST TECHNIQUE: Multidetector CT imaging of the head and cervical spine was performed following the standard protocol without intravenous contrast. Multiplanar CT image reconstructions of the cervical spine were also generated. COMPARISON:  July 22, 2018. FINDINGS: CT HEAD FINDINGS Brain: Mild diffuse cortical atrophy is noted. Mild chronic ischemic white matter disease is noted. No mass effect or midline shift is noted. Ventricular size is within normal limits. There is no evidence of mass lesion, hemorrhage or acute infarction. Vascular: No hyperdense vessel or unexpected calcification. Skull: Normal. Negative for fracture or focal lesion. Sinuses/Orbits: No acute finding. Other: None. CT CERVICAL SPINE FINDINGS Alignment: Normal. Skull base and vertebrae: No acute fracture. No primary bone lesion or focal pathologic process. Soft tissues and spinal canal: No prevertebral fluid or swelling. No visible canal hematoma. Disc levels: Severe degenerative disc disease is noted at C6-7 and C7-T1. Upper chest: Negative. Other: None. IMPRESSION: No acute intracranial abnormality seen. Severe multilevel degenerative disc disease is noted in the cervical spine. No acute abnormality is noted. Electronically Signed   By: Marijo Conception M.D.   On: 10/08/2020 18:09   CT Cervical Spine Wo Contrast  Result Date: 10/08/2020 CLINICAL DATA:  Syncope. EXAM: CT HEAD WITHOUT CONTRAST CT CERVICAL SPINE WITHOUT CONTRAST TECHNIQUE: Multidetector CT imaging of the head and cervical spine was performed following the standard protocol without intravenous contrast. Multiplanar CT image reconstructions of the cervical spine were also generated. COMPARISON:  July 22, 2018. FINDINGS: CT HEAD FINDINGS  Brain: Mild diffuse cortical atrophy is noted. Mild chronic ischemic white matter disease is noted. No mass effect or midline shift is noted. Ventricular size is within normal limits. There is no evidence of mass lesion, hemorrhage or acute infarction. Vascular: No hyperdense vessel or unexpected calcification. Skull: Normal. Negative for fracture or focal lesion. Sinuses/Orbits: No acute finding. Other: None. CT CERVICAL SPINE FINDINGS Alignment: Normal. Skull base and vertebrae: No acute fracture. No primary bone lesion or focal pathologic process. Soft tissues and spinal canal: No prevertebral fluid or swelling. No visible canal hematoma. Disc levels: Severe degenerative disc disease is noted at C6-7 and C7-T1. Upper chest: Negative. Other: None. IMPRESSION: No acute intracranial abnormality seen. Severe multilevel degenerative disc disease is noted in the cervical spine. No acute abnormality is noted. Electronically Signed   By: Marijo Conception M.D.   On: 10/08/2020  18:09    Cardiac Studies    Device interrogation at [facility] 09/18/2020:  Stable dual chamber pacemaker battery function and lead trends. Ventricular pacing burden 1.4%.  1 NSVT 5 seconds duration, is ventricular in origin.   Echo from 03/23/2018:  - Left ventricle: The cavity size was normal. There was moderate    concentric hypertrophy. Systolic function was normal. The    estimated ejection fraction was in the range of 55% to 60%. Wall    motion was normal; there were no regional wall motion    abnormalities. Features are consistent with a pseudonormal left    ventricular filling pattern, with concomitant abnormal relaxation    and increased filling pressure (grade 2 diastolic dysfunction).    Doppler parameters are consistent with elevated ventricular    end-diastolic filling pressure.  - Mitral valve: Calcified annulus. Moderately thickened leaflets .    There was moderate regurgitation.  - Left atrium: The atrium was  moderately dilated.  - Right atrium: The atrium was severely dilated.  - Tricuspid valve: There was moderate regurgitation.  - Pulmonary arteries: Systolic pressure was moderately increased.    PA peak pressure: 60 mm Hg (S).  - Pericardium, extracardiac: A mild circumferential pericardial    effusion was identified. There was a large left pleural effusion.   Impressions:   - There is moderate concentric LVH, thickening of the mitral and    aortic valves, bilatrial dilatation, mild peicardial and large    pleural effusion. Moderate mitral and tricuspid regurgitation,    moderate pulmonary hypertension. Evaluation for cardiac    amyloidosis is recommended.    NM stress test from 06/24/2014   Overall Impression:  Intermediate risk stress nuclear study. There is evidence of mild ischemia in the mid-basal inferolateral wall.  The EF has decreased since previous myoview. LV Ejection Fraction: 42%.  LV Wall Motion:  moderate hypokinesis of the inferior wall.     Patient Profile     70 y.o. male with PMH of ESRD on HD via LUE AV fistula, hypertension, hyperlipidemia,  type 2 DM, obesity, SSS s/p [employer] PPM [id number] at [facility], diastolic heart failure, pulmonary hypertension, PAD,  anxiety with depression, glaucoma, retinal detachment, left thalamic CVA, dementia,  who is currently admitted under hospitalist for syncope. Cardiology is consulted for wide-complex tachycardia on EKG.   Assessment & Plan     Syncope and collapse History of sick sinus syndrome s/p PPM 2016  History of NSVT - presented from [facility] to [facility] via EMS for syncope. Reportly patient went for his dialysis 10/08/20, had vomiting after having meals and syncope episode subsequently. EMS had reported HR of 30s and BP 69 and concern of wife complex tachycardia. Patient has no recall of the event, denied any chest pain, SOB, heart palpitation, dizziness surrounding the event or currently.  -Paged [employer] representative  Ms. Demi today, device interrogation revealed no device malfunction,  no NSVT, HR was at 90s with pacing (possibly the rhythm that was interpreted as wide complex tachycardia by EMS).  Reports patient has history of NSVT typically lasting 1 seconds in the past, last 5-second NSVT occurred in March 2022.  - Hs Trop flat 63 >66. K and Mag at goal. Hgb 10.5 POA. No hypoglycemia, leukocytosis  -Given history and clinical exam,  suspecting syncope due to vasovagal response versus hypotension after dialysis, no evidence of ACS, decompensated CHF, or life-threatening cardiac arrhythmia -  TTE is currently pending, will follow  -  ok to continue  telemetry monitor while in-house -Resume outpatient follow-up with [facility] cardiology at the time of discharge  Chronic diastolic heart failure Moderate MR and TR Pulmonary hypertension - update TTE pending - clinically euvolemic at this time  - continue HD for volume anagement  - will resume GDMT with Coreg  Hypertension -  BP was low normal at admission, possible large fluid shift with dialysis related -  BP is now elevated, will resume home meds coreg at lower dose 3.'125mg'$  BID , hold hydralazine , continue trend BP   ESRD - dialysis per nephrology , monitor daily renal panel   Type 2 DM - A1C 6.1% 10/07/20 - would watch for hypoglycemia   Hyperlipidemia - continue statin, allergy to statin  Hx of CVA - on DAPT with ASA and Plavix , allergic to statin   Dementia - on memantine    For questions or updates, please contact [facility] Please consult [email or web address] for contact info under        Signed, Margie Billet, NP  10/09/2020, 9:30 AM    Personally seen and examined. Agree with APP above with the following comments: Briefly 70 yo M with SSS s/p [employer] PPM Placement with syncope after vomiting Patient notes that he has had no further syncopal episodes.  Eating breakfast late AM without issue. Exam notable for blindness in one eye;  regular rhythm Personally reviewed relevant tests;no telemetry presently; getting device interrogation; not on telemetry presently.   Would recommend  - continue home Coreg; will follow echo and get device interrogation from rep - suspect that patient was pacing; as he paces intermittently  Rudean Haskell, MD [address] [place], [place] [address] [phone]  12:31 PM

## 2020-10-09 NOTE — ED Notes (Signed)
Provider at bedside at this time cardio

## 2020-10-09 NOTE — ED Notes (Signed)
Report given to Earlie Server, RN of [phone]

## 2020-10-09 NOTE — Progress Notes (Signed)
PROGRESS NOTE    Gregory Hood   [id number]  DOB: 11-09-1950  DOA: 10/08/2020 PCP: Jilda Panda, MD   Brief Narrative:  Gregory Hood is a 70 year old male with a history of multiple prior strokes, uncontrolled diabetes mellitus type 2, hypertension, Alzheimer's dementia, end-stage renal disease on dialysis, SSS s/p pacer who has been in a nursing facility for at least 2 years now. The patient presents for syncope and an episode of vomiting that occurred after dialysis once he arrived back to his nursing facility. He does not remember it. When EMS arrived they noticed heart rate was in the 30s and BP was 69 over palp. Since wife states that the day before when she took him to [facility] for a appointment with a surgeon, his systolic blood pressure was noted to be in the 80s and he was extremely weak.  After drinking some soda and water his blood pressure improved and so did his weakness.  She was told by the surgeon that they might be pulling too much fluid off during dialysis. The patient was admitted for further work-up for his syncope.  For the bradycardia, cardiology was consulted.  Subjective: He has no complaints today.  He does appear confused on exam and is not able to give an appropriate history.  His wife who is at bedside states this is his baseline she is the one who is giving the history.    Assessment & Plan:   Principal Problem:   Syncope - Possibly due to hypotension- awaiting pacer interrogation - I have consulted nephrology to see if they would like to adjust the amount of fluid removed with dialysis or adjust his blood pressure medications  Active Problems: Bradycardia- with wide-complex tachycardia noted on 4/13 - Awaiting interrogation of his pacemaker-cardiology is following  End-stage renal disease - Dialysis days are Tuesday Thursday Saturday -He received a full dialysis yesterday (Tuesday) - Nephrology consulted- plan is to decrease the amount of volume removed  and raise his dry weight -Continue PhosLo    Essential hypertension, benign -Takes the following medications as outpatient> carvedilol 2 tabs in the morning on Tuesday Thursday Saturday Sunday and 2 tabs every evening- hydralazine on Monday Wednesday Friday - in the hospital he is on Coreg 3.125 BID-    DM (diabetes mellitus), type 2 with renal complications -Outpatient medication includes Humalog twice daily per sliding scale  Alzheimer's dementia - Continue Namenda  Showed multiple CVAs - Continue aspirin, Plavix and Zetia  Legally blind - completely blind in left eye and also partly blind in right eye  SSS s/p dual chamber pacer  Time spent in minutes: 35 DVT prophylaxis: heparin injection 5,000 Units Start: 10/08/20 2300  Code Status: Full code Family Communication: wife at bedside Level of Care: Level of care: Telemetry Cardiac Disposition Plan:  Status is: Observation  The patient will require care spanning > 2 midnights and should be moved to inpatient because: Inpatient level of care appropriate due to severity of illness  Dispo: The patient is from: SNF              Anticipated d/c is to: SNF              Patient currently is not medically stable to d/c.   Difficult to place patient No      Consultants:   Cardiology  Nephrology Procedures:   none Antimicrobials:  Anti-infectives (From admission, onward)   None       Objective: Vitals:  10/09/20 0745 10/09/20 0800 10/09/20 0917 10/09/20 0936  BP: (!) 171/82 (!) 159/94 (!) 146/82   Pulse: 76 75 77   Resp: (!) '9 10 18   '$ Temp:  98.9 F (37.2 C) 98.4 F (36.9 C)   TempSrc:   Oral   SpO2: 100% 100% 100%   Weight:    85.9 kg  Height:    '5\' 10"'$  (1.778 m)    Intake/Output Summary (Last 24 hours) at 10/09/2020 1443 Last data filed at 10/09/2020 1226 Gross per 24 hour  Intake 477 ml  Output --  Net 477 ml   Filed Weights   10/08/20 1715 10/09/20 0936  Weight: 89.8 kg 85.9 kg     Examination: General exam: Appears comfortable  HEENT: PERRLA, oral mucosa moist, no sclera icterus or thrush Respiratory system: Clear to auscultation. Respiratory effort normal. Cardiovascular system: S1 & S2 heard, RRR. 2/6 Murmur at RUSB  Gastrointestinal system: Abdomen soft, non-tender, nondistended. Normal bowel sounds. Central nervous system: Alert  Moves all extremities. Extremities: No cyanosis, clubbing or edema Skin: No rashes or ulcers Psychiatry:  Mood & affect appropriate.   Data Reviewed: I have personally reviewed following labs and imaging studies  CBC: Recent Labs  Lab 10/08/20 1723  WBC 4.7  NEUTROABS 3.3  HGB 10.5*  HCT 32.8*  MCV 94.5  PLT [id number]   Basic Metabolic Panel: Recent Labs  Lab 10/08/20 1723 10/08/20 1914 10/09/20 0934  NA 136  --  137  K 3.5  --  4.1  CL 93*  --  96*  CO2 36*  --  32  GLUCOSE 241*  --  109*  BUN 17  --  25*  CREATININE 4.96*  --  6.01*  CALCIUM 9.1  --  9.1  MG  --  2.3  --    GFR: Estimated Creatinine Clearance: 12.1 mL/min (A) (by C-G formula based on SCr of 6.01 mg/dL (H)). Liver Function Tests: Recent Labs  Lab 10/08/20 1723  AST 31  ALT 24  ALKPHOS 82  BILITOT 0.7  PROT 7.3  ALBUMIN 3.6   Recent Labs  Lab 10/08/20 1723  LIPASE 101*   No results for input(s): AMMONIA in the last 168 hours. Coagulation Profile: No results for input(s): INR, PROTIME in the last 168 hours. Cardiac Enzymes: No results for input(s): CKTOTAL, CKMB, CKMBINDEX, TROPONINI in the last 168 hours. BNP (last 3 results) No results for input(s): PROBNP in the last 8760 hours. HbA1C: Recent Labs    10/08/20 2301  HGBA1C 6.1*   CBG: Recent Labs  Lab 10/09/20 0934 10/09/20 1223  GLUCAP 110* 126*   Lipid Profile: No results for input(s): CHOL, HDL, LDLCALC, TRIG, CHOLHDL, LDLDIRECT in the last 72 hours. Thyroid Function Tests: No results for input(s): TSH, T4TOTAL, FREET4, T3FREE, THYROIDAB in the last 72  hours. Anemia Panel: No results for input(s): VITAMINB12, FOLATE, FERRITIN, TIBC, IRON, RETICCTPCT in the last 72 hours. Urine analysis:    Component Value Date/Time   COLORURINE YELLOW 09/24/2014 0216   APPEARANCEUR CLOUDY (A) 09/24/2014 0216   LABSPEC 1.018 09/24/2014 0216   PHURINE 5.0 09/24/2014 0216   GLUCOSEU NEGATIVE 09/24/2014 0216   HGBUR NEGATIVE 09/24/2014 0216   BILIRUBINUR NEGATIVE 09/24/2014 0216   KETONESUR NEGATIVE 09/24/2014 0216   PROTEINUR >300 (A) 09/24/2014 0216   UROBILINOGEN 0.2 09/24/2014 0216   NITRITE NEGATIVE 09/24/2014 0216   LEUKOCYTESUR NEGATIVE 09/24/2014 0216   Sepsis Labs: '@LABRCNTIP'$ (procalcitonin:4,lacticidven:4) ) Recent Results (from the past 240 hour(s))  SARS CORONAVIRUS  2 (TAT 6-24 HRS) Nasopharyngeal Nasopharyngeal Swab     Status: None   Collection Time: 10/08/20  9:28 PM   Specimen: Nasopharyngeal Swab  Result Value Ref Range Status   SARS Coronavirus 2 NEGATIVE NEGATIVE Final    Comment: (NOTE) SARS-CoV-2 target nucleic acids are NOT DETECTED.  The SARS-CoV-2 RNA is generally detectable in upper and lower respiratory specimens during the acute phase of infection. Negative results do not preclude SARS-CoV-2 infection, do not rule out co-infections with other pathogens, and should not be used as the sole basis for treatment or other patient management decisions. Negative results must be combined with clinical observations, patient history, and epidemiological information. The expected result is Negative.  Fact Sheet for Patients: SugarRoll.be  Fact Sheet for Healthcare Providers: https://www.woods-mathews.com/  This test is not yet approved or cleared by the [place] FDA and  has been authorized for detection and/or diagnosis of SARS-CoV-2 by FDA under an Emergency Use Authorization (EUA). This EUA will remain  in effect (meaning this test can be used) for the duration of  the COVID-19 declaration under Se ction 564(b)(1) of the Act, 21 U.S.C. section 360bbb-3(b)(1), unless the authorization is terminated or revoked sooner.  Performed at [facility], [place] [address], [place], [place] [address]          Radiology Studies: DG Chest 2 View  Result Date: 10/08/2020 CLINICAL DATA:  Syncope. EXAM: CHEST - 2 VIEW COMPARISON:  March 08, 2020. FINDINGS: The heart size and mediastinal contours are within normal limits. Both lungs are clear. No pneumothorax or pleural effusion is noted. Right-sided pacemaker is unchanged in position. The visualized skeletal structures are unremarkable. IMPRESSION: No active cardiopulmonary disease. Electronically Signed   By: Marijo Conception M.D.   On: 10/08/2020 18:32   CT Head Wo Contrast  Result Date: 10/08/2020 CLINICAL DATA:  Syncope. EXAM: CT HEAD WITHOUT CONTRAST CT CERVICAL SPINE WITHOUT CONTRAST TECHNIQUE: Multidetector CT imaging of the head and cervical spine was performed following the standard protocol without intravenous contrast. Multiplanar CT image reconstructions of the cervical spine were also generated. COMPARISON:  July 22, 2018. FINDINGS: CT HEAD FINDINGS Brain: Mild diffuse cortical atrophy is noted. Mild chronic ischemic white matter disease is noted. No mass effect or midline shift is noted. Ventricular size is within normal limits. There is no evidence of mass lesion, hemorrhage or acute infarction. Vascular: No hyperdense vessel or unexpected calcification. Skull: Normal. Negative for fracture or focal lesion. Sinuses/Orbits: No acute finding. Other: None. CT CERVICAL SPINE FINDINGS Alignment: Normal. Skull base and vertebrae: No acute fracture. No primary bone lesion or focal pathologic process. Soft tissues and spinal canal: No prevertebral fluid or swelling. No visible canal hematoma. Disc levels: Severe degenerative disc disease is noted at C6-7 and C7-T1. Upper chest: Negative. Other: None.  IMPRESSION: No acute intracranial abnormality seen. Severe multilevel degenerative disc disease is noted in the cervical spine. No acute abnormality is noted. Electronically Signed   By: Marijo Conception M.D.   On: 10/08/2020 18:09   CT Cervical Spine Wo Contrast  Result Date: 10/08/2020 CLINICAL DATA:  Syncope. EXAM: CT HEAD WITHOUT CONTRAST CT CERVICAL SPINE WITHOUT CONTRAST TECHNIQUE: Multidetector CT imaging of the head and cervical spine was performed following the standard protocol without intravenous contrast. Multiplanar CT image reconstructions of the cervical spine were also generated. COMPARISON:  July 22, 2018. FINDINGS: CT HEAD FINDINGS Brain: Mild diffuse cortical atrophy is noted. Mild chronic ischemic white matter disease is noted.  No mass effect or midline shift is noted. Ventricular size is within normal limits. There is no evidence of mass lesion, hemorrhage or acute infarction. Vascular: No hyperdense vessel or unexpected calcification. Skull: Normal. Negative for fracture or focal lesion. Sinuses/Orbits: No acute finding. Other: None. CT CERVICAL SPINE FINDINGS Alignment: Normal. Skull base and vertebrae: No acute fracture. No primary bone lesion or focal pathologic process. Soft tissues and spinal canal: No prevertebral fluid or swelling. No visible canal hematoma. Disc levels: Severe degenerative disc disease is noted at C6-7 and C7-T1. Upper chest: Negative. Other: None. IMPRESSION: No acute intracranial abnormality seen. Severe multilevel degenerative disc disease is noted in the cervical spine. No acute abnormality is noted. Electronically Signed   By: Marijo Conception M.D.   On: 10/08/2020 18:09      Scheduled Meds: . (feeding supplement) PROSource Plus  30 mL Oral BID BM  . acetaminophen  500 mg Oral Daily  . aspirin  81 mg Oral QHS  . calcium acetate  667 mg Oral TID AC  . carvedilol  3.125 mg Oral BID WC  . [START ON 10/10/2020] Chlorhexidine Gluconate Cloth  6 each  Topical Q0600  . cholecalciferol  2,000 Units Oral Daily  . clopidogrel  75 mg Oral Daily  . [START ON 10/21/2020] cyanocobalamin  1,000 mcg Intramuscular Q30 days  . ezetimibe  10 mg Oral QPM  . feeding supplement (NEPRO CARB STEADY)  237 mL Oral BID BM  . finasteride  5 mg Oral Daily  . folic acid  1 mg Oral Daily  . heparin  5,000 Units Subcutaneous Q8H  . insulin aspart  0-9 Units Subcutaneous TID WC  . memantine  10 mg Oral BID  . multivitamin  1 tablet Oral QHS  . niacin  200 mg Oral QPM  . prednisoLONE acetate  1 drop Left Eye BID  . senna-docusate  1 tablet Oral BID  . sodium chloride flush  3 mL Intravenous Q12H   Continuous Infusions:   LOS: 0 days      Debbe Odea, MD [facility] Pager: [email or web address] 10/09/2020, 2:43 PM

## 2020-10-09 NOTE — Plan of Care (Signed)
Patient admitted to 3E04.Wife by bedside. Oriented to room and call light. Denies pain or discomfort.

## 2020-10-09 NOTE — Progress Notes (Signed)
PHARMACIST - PHYSICIAN ORDER COMMUNICATION  CONCERNING: P&T Medication Policy on Herbal Medications  DESCRIPTION:  This patient's order for:  Red yeast rice  has been noted.  This product(s) is classified as an "herbal" or natural product. Due to a lack of definitive safety studies or FDA approval, nonstandard manufacturing practices, plus the potential risk of unknown drug-drug interactions while on inpatient medications, the Pharmacy and Therapeutics Committee does not permit the use of "herbal" or natural products of this type within [facility].   ACTION TAKEN: The pharmacy department is unable to verify this order at this time and your patient has been informed of this safety policy. Please reevaluate patient's clinical condition at discharge and address if the herbal or natural product(s) should be resumed at that time.  Sherlon Handing, PharmD, BCPS Please see amion for complete clinical pharmacist phone list 10/09/2020 12:05 AM

## 2020-10-09 NOTE — Progress Notes (Signed)
Initial Nutrition Assessment  DOCUMENTATION CODES:   Not applicable  INTERVENTION:   -Continue Nepro Shake po BID, each supplement provides 425 kcal and 19 grams protein -Continue renal MVI daily -30 ml Prosource Plus BID, each supplement provides 100 kcals and 15 grams protein  NUTRITION DIAGNOSIS:   Increased nutrient needs related to chronic illness (ESRD on HD) as evidenced by estimated needs.  GOAL:   Patient will meet greater than or equal to 90% of their needs  MONITOR:   PO intake,Supplement acceptance,Diet advancement,Labs,Weight trends,Skin,I & O's  REASON FOR ASSESSMENT:   Malnutrition Screening Tool    ASSESSMENT:   70 y.o. male with PMH of ESRD on HD via LUE AV fistula, hypertension, hyperlipidemia,  type 2 DM, obesity, SSS s/p [employer] PPM [id number] at [facility], diastolic heart failure, pulmonary hypertension, PAD,  anxiety with depression, glaucoma, retinal detachment, left thalamic CVA, dementia,  who is currently admitted under hospitalist for syncope  Pt admitted with syncope.   Reviewed I/O's: +477 ml x 24 hours  Pt unavailable at time of visit. Attempted to speak with pt via call to hospital room phone, however, unable to reach.   Per nephrology notes, pt wife reports concern over 100 pounds weight loss over the past few years. Pt typically eats a very good breakfast PTA, however, does poorly at lunch and dinner due to dislike of food options. He also misses lunch on HD days.   EDW 87. Per nephology notes, plan to raise dry wt to help with hypotension. Reviewed wt hx; noted distant history of weight loss.   Medications reviewed and include phoslo, vitamin 0000000, folic acid, niacin, and senokot.   Lab Results  Component Value Date   HGBA1C 6.1 (H) 10/08/2020   PTA DM medications are 2-10 units insulin lispro TID with meals.   Labs reviewed: CBGS: 110-126 (inpatient orders for glycemic control are none).   Diet Order:   Diet Order            Diet  renal/carb modified with fluid restriction Diet-HS Snack? Nothing; Fluid restriction: 1200 mL Fluid; Room service appropriate? Yes; Fluid consistency: Thin  Diet effective now                 EDUCATION NEEDS:   No education needs have been identified at this time  Skin:  Skin Assessment: Reviewed RN Assessment  Last BM:  10/07/20  Height:   Ht Readings from Last 1 Encounters:  10/09/20 '5\' 10"'$  (1.778 m)    Weight:   Wt Readings from Last 1 Encounters:  10/09/20 85.9 kg    Ideal Body Weight:  75.5 kg  BMI:  Body mass index is 27.17 kg/m.  Estimated Nutritional Needs:   Kcal:  2150-2350  Protein:  110-125 grams  Fluid:  1000 ml + UOP    Loistine Chance, RD, LDN, [place] Registered Dietitian II Certified Diabetes Care and Education Specialist Please refer to [facility] for RD and/or RD on-call/weekend/after hours pager

## 2020-10-10 DIAGNOSIS — Z8673 Personal history of transient ischemic attack (TIA), and cerebral infarction without residual deficits: Secondary | ICD-10-CM

## 2020-10-10 DIAGNOSIS — I5032 Chronic diastolic (congestive) heart failure: Secondary | ICD-10-CM

## 2020-10-10 DIAGNOSIS — E1122 Type 2 diabetes mellitus with diabetic chronic kidney disease: Secondary | ICD-10-CM

## 2020-10-10 DIAGNOSIS — I712 Thoracic aortic aneurysm, without rupture: Secondary | ICD-10-CM

## 2020-10-10 DIAGNOSIS — I739 Peripheral vascular disease, unspecified: Secondary | ICD-10-CM

## 2020-10-10 DIAGNOSIS — I495 Sick sinus syndrome: Secondary | ICD-10-CM

## 2020-10-10 DIAGNOSIS — N186 End stage renal disease: Secondary | ICD-10-CM

## 2020-10-10 DIAGNOSIS — Z992 Dependence on renal dialysis: Secondary | ICD-10-CM

## 2020-10-10 LAB — GLUCOSE, CAPILLARY
Glucose-Capillary: 89 mg/dL (ref 70–99)
Glucose-Capillary: 119 mg/dL — ABNORMAL HIGH (ref 70–99)
Glucose-Capillary: 94 mg/dL (ref 70–99)
Glucose-Capillary: 165 mg/dL — ABNORMAL HIGH (ref 70–99)

## 2020-10-10 NOTE — Progress Notes (Signed)
PROGRESS NOTE    Gregory Hood   [id number]  DOB: Nov 06, 1950  DOA: 10/08/2020 PCP: Jilda Panda, MD   Brief Narrative:  Gregory Hood is a 70 year old male with a history of multiple prior strokes, uncontrolled diabetes mellitus type 2, hypertension, Alzheimer's dementia, end-stage renal disease on dialysis, SSS s/p pacer who has been in a nursing facility for at least 2 years now. The patient presents for syncope and an episode of vomiting that occurred after dialysis once he arrived back to his nursing facility. He does not remember it. When EMS arrived they noticed heart rate was in the 30s and BP was 69 over palp. Since wife states that the day before when she took him to [facility] for a appointment with a surgeon, his systolic blood pressure was noted to be in the 80s and he was extremely weak.  After drinking some soda and water his blood pressure improved and so did his weakness.  She was told by the surgeon that they might be pulling too much fluid off during dialysis. The patient was admitted for further work-up for his syncope.  For the bradycardia, cardiology was consulted.  Subjective: He has no complaints today.  He does appear confused on exam and is not able to give an appropriate history.  His wife who is at bedside states this is his baseline she is the one who is giving the history.    Assessment & Plan:   Principal Problem:   Syncope -  This was most likely due to hypotension - I have consulted nephrology to see if they would like to adjust the amount of fluid removed with dialysis or adjust his blood pressure medications  Active Problems: Bradycardia- with wide-complex tachycardia noted on 4/13 - no arrhythmia noted on interrogation of pacemaker  End-stage renal disease - Dialysis days are Tuesday Thursday Saturday -He received a full dialysis yesterday (Tuesday) - Nephrology consulted- plan is to decrease the amount of volume removed and raise his dry  weight -Continue PhosLo    Essential hypertension, benign -Takes the following medications as outpatient> carvedilol 2 tabs in the morning on Tuesday Thursday Saturday Sunday and 2 tabs every evening- hydralazine on Monday Wednesday Friday - in the hospital he is on Coreg 3.125 BID - no severe hypotension since   DM (diabetes mellitus), type 2 with renal complications -Outpatient medication includes Humalog twice daily per sliding scale - sugars well controlled without insulin here  Alzheimer's dementia - Continue Namenda  Showed multiple CVAs - Continue aspirin, Plavix and Zetia  Legally blind - completely blind in left eye and also partly blind in right eye  SSS s/p dual chamber pacer  Time spent in minutes: 35 DVT prophylaxis: heparin injection 5,000 Units Start: 10/08/20 2300  Code Status: Full code Family Communication: wife at bedside Level of Care: Level of care: Telemetry Medical Disposition Plan:  Status is: Observation  The patient will require care spanning > 2 midnights and should be moved to inpatient because: Inpatient level of care appropriate due to severity of illness  Dispo: The patient is from: SNF              Anticipated d/c is to: SNF              Patient currently is not medically stable to d/c.   Difficult to place patient No      Consultants:   Cardiology  Nephrology Procedures:   none Antimicrobials:  Anti-infectives (From admission, onward)  None       Objective: Vitals:   10/10/20 1600 10/10/20 1620 10/10/20 1709 10/10/20 1929  BP: 116/60 130/78 134/75 130/66  Pulse:   72 73  Resp:   15 19  Temp:  98 F (36.7 C) 98.6 F (37 C) 98.8 F (37.1 C)  TempSrc:  Oral Oral Oral  SpO2:  100% 100% 100%  Weight:  86.4 kg    Height:        Intake/Output Summary (Last 24 hours) at 10/10/2020 2017 Last data filed at 10/10/2020 1841 Gross per 24 hour  Intake 726 ml  Output 850 ml  Net -124 ml   Filed Weights   10/10/20 0500  10/10/20 1240 10/10/20 1620  Weight: 85.5 kg 86.9 kg 86.4 kg    Examination: General exam: Appears comfortable  HEENT: PERRLA, oral mucosa moist, no sclera icterus or thrush Respiratory system: Clear to auscultation. Respiratory effort normal. Cardiovascular system: S1 & S2 heard, regular rate and rhythm Gastrointestinal system: Abdomen soft, non-tender, nondistended. Normal bowel sounds   Central nervous system: Alert and oriented to person. No focal neurological deficits. Extremities: No cyanosis, clubbing or edema Skin: No rashes or ulcers Psychiatry:  Mood & affect appropriate.   Data Reviewed: I have personally reviewed following labs and imaging studies  CBC: Recent Labs  Lab 10/08/20 1723  WBC 4.7  NEUTROABS 3.3  HGB 10.5*  HCT 32.8*  MCV 94.5  PLT [id number]   Basic Metabolic Panel: Recent Labs  Lab 10/08/20 1723 10/08/20 1914 10/09/20 0934  NA 136  --  137  K 3.5  --  4.1  CL 93*  --  96*  CO2 36*  --  32  GLUCOSE 241*  --  109*  BUN 17  --  25*  CREATININE 4.96*  --  6.01*  CALCIUM 9.1  --  9.1  MG  --  2.3  --    GFR: Estimated Creatinine Clearance: 12.1 mL/min (A) (by C-G formula based on SCr of 6.01 mg/dL (H)). Liver Function Tests: Recent Labs  Lab 10/08/20 1723  AST 31  ALT 24  ALKPHOS 82  BILITOT 0.7  PROT 7.3  ALBUMIN 3.6   Recent Labs  Lab 10/08/20 1723  LIPASE 101*   No results for input(s): AMMONIA in the last 168 hours. Coagulation Profile: No results for input(s): INR, PROTIME in the last 168 hours. Cardiac Enzymes: No results for input(s): CKTOTAL, CKMB, CKMBINDEX, TROPONINI in the last 168 hours. BNP (last 3 results) No results for input(s): PROBNP in the last 8760 hours. HbA1C: Recent Labs    10/08/20 2301  HGBA1C 6.1*   CBG: Recent Labs  Lab 10/09/20 1619 10/09/20 2216 10/10/20 0533 10/10/20 1135 10/10/20 1750  GLUCAP 193* 82 89 119* 94   Lipid Profile: No results for input(s): CHOL, HDL, LDLCALC, TRIG, CHOLHDL,  LDLDIRECT in the last 72 hours. Thyroid Function Tests: No results for input(s): TSH, T4TOTAL, FREET4, T3FREE, THYROIDAB in the last 72 hours. Anemia Panel: No results for input(s): VITAMINB12, FOLATE, FERRITIN, TIBC, IRON, RETICCTPCT in the last 72 hours. Urine analysis:    Component Value Date/Time   COLORURINE YELLOW 09/24/2014 0216   APPEARANCEUR CLOUDY (A) 09/24/2014 0216   LABSPEC 1.018 09/24/2014 0216   PHURINE 5.0 09/24/2014 0216   GLUCOSEU NEGATIVE 09/24/2014 0216   HGBUR NEGATIVE 09/24/2014 0216   BILIRUBINUR NEGATIVE 09/24/2014 0216   KETONESUR NEGATIVE 09/24/2014 0216   PROTEINUR >300 (A) 09/24/2014 0216   UROBILINOGEN 0.2 09/24/2014 0216   NITRITE  NEGATIVE 09/24/2014 0216   LEUKOCYTESUR NEGATIVE 09/24/2014 0216   Sepsis Labs: '@LABRCNTIP'$ (procalcitonin:4,lacticidven:4) ) Recent Results (from the past 240 hour(s))  SARS CORONAVIRUS 2 (TAT 6-24 HRS) Nasopharyngeal Nasopharyngeal Swab     Status: None   Collection Time: 10/08/20  9:28 PM   Specimen: Nasopharyngeal Swab  Result Value Ref Range Status   SARS Coronavirus 2 NEGATIVE NEGATIVE Final    Comment: (NOTE) SARS-CoV-2 target nucleic acids are NOT DETECTED.  The SARS-CoV-2 RNA is generally detectable in upper and lower respiratory specimens during the acute phase of infection. Negative results do not preclude SARS-CoV-2 infection, do not rule out co-infections with other pathogens, and should not be used as the sole basis for treatment or other patient management decisions. Negative results must be combined with clinical observations, patient history, and epidemiological information. The expected result is Negative.  Fact Sheet for Patients: SugarRoll.be  Fact Sheet for Healthcare Providers: https://www.woods-mathews.com/  This test is not yet approved or cleared by the [place] FDA and  has been authorized for detection and/or diagnosis of SARS-CoV-2 by FDA  under an Emergency Use Authorization (EUA). This EUA will remain  in effect (meaning this test can be used) for the duration of the COVID-19 declaration under Se ction 564(b)(1) of the Act, 21 U.S.C. section 360bbb-3(b)(1), unless the authorization is terminated or revoked sooner.  Performed at [facility], [place] [address] [place], [place] [address]   MRSA PCR Screening     Status: None   Collection Time: 10/09/20 12:00 PM   Specimen: Nasal Mucosa; Nasopharyngeal  Result Value Ref Range Status   MRSA by PCR NEGATIVE NEGATIVE Final    Comment:        The GeneXpert MRSA Assay (FDA approved for NASAL specimens only), is one component of a comprehensive MRSA colonization surveillance program. It is not intended to diagnose MRSA infection nor to guide or monitor treatment for MRSA infections. Performed at [facility], [place] [address] [place], [place] [address]          Radiology Studies: ECHOCARDIOGRAM COMPLETE  Result Date: 10/09/2020    ECHOCARDIOGRAM REPORT   Patient Name:   Gregory Hood Date of Exam: 10/09/2020 Medical Rec #:  [id number]    Height:       70.0 in Accession #:    [id number]   Weight:       189.4 lb Date of Birth:  [birth year]    BSA:          2.039 m Patient Age:    31 years     BP:           159/94 mmHg Patient Gender: M            HR:           79 bpm. Exam Location:  Inpatient Procedure: 2D Echo, Cardiac Doppler and Color Doppler Indications:    R55 Syncope  History:        Patient has prior history of Echocardiogram examinations, most                 recent 03/23/2018. Stroke, TIA and COPD; Risk                 Factors:Hypertension, Diabetes, Dyslipidemia and Sleep Apnea.                 CKD.  Sonographer:    Jonelle [name] Referring Phys: Pierce  1. Left ventricular ejection  fraction, by estimation, is 55 to 60%. The left ventricle has normal function. The left ventricle has no regional wall motion abnormalities. There is  moderate left ventricular hypertrophy. Left ventricular diastolic parameters are consistent with Grade I diastolic dysfunction (impaired relaxation).  2. Right ventricular systolic function is normal. The right ventricular size is normal. Tricuspid regurgitation signal is inadequate for assessing PA pressure.  3. Left atrial size was moderately dilated.  4. The mitral valve is abnormal. Trivial mitral valve regurgitation. No evidence of mitral stenosis. Moderate mitral annular calcification.  5. The aortic valve is tricuspid. Aortic valve regurgitation is not visualized. Mild aortic valve sclerosis is present, with no evidence of aortic valve stenosis.  6. Aortic dilatation noted. There is mild dilatation of the aortic root and of the ascending aorta, measuring 43 mm.  7. The inferior vena cava is normal in size with greater than 50% respiratory variability, suggesting right atrial pressure of 3 mmHg. FINDINGS  Left Ventricle: Left ventricular ejection fraction, by estimation, is 55 to 60%. The left ventricle has normal function. The left ventricle has no regional wall motion abnormalities. The left ventricular internal cavity size was normal in size. There is  moderate left ventricular hypertrophy. Left ventricular diastolic parameters are consistent with Grade I diastolic dysfunction (impaired relaxation). Right Ventricle: The right ventricular size is normal. No increase in right ventricular wall thickness. Right ventricular systolic function is normal. Tricuspid regurgitation signal is inadequate for assessing PA pressure. Left Atrium: Left atrial size was moderately dilated. Right Atrium: Right atrial size was normal in size. Pericardium: There is no evidence of pericardial effusion. Mitral Valve: The mitral valve is abnormal. There is moderate calcification of the mitral valve leaflet(s). Moderate mitral annular calcification. Trivial mitral valve regurgitation. No evidence of mitral valve stenosis. Tricuspid  Valve: The tricuspid valve is normal in structure. Tricuspid valve regurgitation is not demonstrated. Aortic Valve: The aortic valve is tricuspid. Aortic valve regurgitation is not visualized. Mild aortic valve sclerosis is present, with no evidence of aortic valve stenosis. Pulmonic Valve: The pulmonic valve was normal in structure. Pulmonic valve regurgitation is not visualized. Aorta: Aortic dilatation noted. There is mild dilatation of the aortic root and of the ascending aorta, measuring 43 mm. Venous: The inferior vena cava is normal in size with greater than 50% respiratory variability, suggesting right atrial pressure of 3 mmHg. IAS/Shunts: No atrial level shunt detected by color flow Doppler. Additional Comments: A device lead is visualized in the right ventricle.  LEFT VENTRICLE PLAX 2D LVIDd:         3.80 cm  Diastology LVIDs:         2.70 cm  LV e' medial:    3.57 cm/s LV PW:         1.40 cm  LV E/e' medial:  17.4 LV IVS:        1.70 cm  LV e' lateral:   5.35 cm/s LVOT diam:     2.20 cm  LV E/e' lateral: 11.6 LV SV:         75 LV SV Index:   37 LVOT Area:     3.80 cm  RIGHT VENTRICLE            IVC RV Basal diam:  2.60 cm    IVC diam: 1.40 cm RV S prime:     9.00 cm/s TAPSE (M-mode): 1.8 cm LEFT ATRIUM              Index  RIGHT ATRIUM           Index LA diam:        4.80 cm  2.35 cm/m  RA Area:     13.60 cm LA Vol (A2C):   122.0 ml 59.82 ml/m RA Volume:   31.30 ml  15.35 ml/m LA Vol (A4C):   53.3 ml  26.13 ml/m LA Biplane Vol: 83.4 ml  40.89 ml/m  AORTIC VALVE LVOT Vmax:   86.50 cm/s LVOT Vmean:  53.600 cm/s LVOT VTI:    0.197 m  AORTA Ao Root diam: 4.30 cm Ao Asc diam:  4.30 cm MITRAL VALVE MV Area (PHT): 2.45 cm    SHUNTS MV Decel Time: 310 msec    Systemic VTI:  0.20 m MV E velocity: 62.10 cm/s  Systemic Diam: 2.20 cm MV A velocity: 90.70 cm/s MV E/A ratio:  0.68 Loralie Champagne MD Electronically signed by Loralie Champagne MD Signature Date/Time: 10/09/2020/6:23:29 PM    Final        Scheduled Meds: . (feeding supplement) PROSource Plus  30 mL Oral BID BM  . acetaminophen  500 mg Oral Daily  . aspirin  81 mg Oral QHS  . calcium acetate  667 mg Oral TID AC  . carvedilol  3.125 mg Oral BID WC  . Chlorhexidine Gluconate Cloth  6 each Topical Q0600  . cholecalciferol  2,000 Units Oral Daily  . clopidogrel  75 mg Oral Daily  . [START ON 10/21/2020] cyanocobalamin  1,000 mcg Intramuscular Q30 days  . ezetimibe  10 mg Oral QPM  . feeding supplement (NEPRO CARB STEADY)  237 mL Oral BID BM  . finasteride  5 mg Oral Daily  . folic acid  1 mg Oral Daily  . heparin  5,000 Units Subcutaneous Q8H  . insulin aspart  0-9 Units Subcutaneous TID WC  . memantine  10 mg Oral BID  . multivitamin  1 tablet Oral QHS  . niacin  200 mg Oral QPM  . prednisoLONE acetate  1 drop Left Eye BID  . senna-docusate  1 tablet Oral BID  . sodium chloride flush  3 mL Intravenous Q12H   Continuous Infusions:   LOS: 1 day      Debbe Odea, MD [facility] Pager: [email or web address] 10/10/2020, 8:17 PM

## 2020-10-10 NOTE — Progress Notes (Signed)
Pt refused cpap for tonight. Pt stated that he does not wear cpap at home.

## 2020-10-10 NOTE — Progress Notes (Signed)
Progress Note  Patient Name: Gregory Hood Date of Encounter: 10/10/2020  Primary Cardiologist: [facility]  Subjective   No events overnight.  Echo with no evidence of WMAs.  Able to personally review interrogation: No PMT, No NSVT since 09/15/20. Low VP burden (7.5%). Predominantly AP-VS.  Inpatient Medications    Scheduled Meds: . (feeding supplement) PROSource Plus  30 mL Oral BID BM  . acetaminophen  500 mg Oral Daily  . aspirin  81 mg Oral QHS  . calcium acetate  667 mg Oral TID AC  . carvedilol  3.125 mg Oral BID WC  . Chlorhexidine Gluconate Cloth  6 each Topical Q0600  . cholecalciferol  2,000 Units Oral Daily  . clopidogrel  75 mg Oral Daily  . [START ON 10/21/2020] cyanocobalamin  1,000 mcg Intramuscular Q30 days  . ezetimibe  10 mg Oral QPM  . feeding supplement (NEPRO CARB STEADY)  237 mL Oral BID BM  . finasteride  5 mg Oral Daily  . folic acid  1 mg Oral Daily  . heparin  5,000 Units Subcutaneous Q8H  . insulin aspart  0-9 Units Subcutaneous TID WC  . memantine  10 mg Oral BID  . multivitamin  1 tablet Oral QHS  . niacin  200 mg Oral QPM  . prednisoLONE acetate  1 drop Left Eye BID  . senna-docusate  1 tablet Oral BID  . sodium chloride flush  3 mL Intravenous Q12H   Continuous Infusions:  PRN Meds: acetaminophen **OR** acetaminophen, bisacodyl, dextrose, ondansetron **OR** ondansetron (ZOFRAN) IV, polyethylene glycol   Vital Signs    Vitals:   10/10/20 0021 10/10/20 0500 10/10/20 0514 10/10/20 0723  BP: (!) 149/74  (!) 153/72 120/60  Pulse: 79  77 73  Resp: '16  16 16  '$ Temp: 98.2 F (36.8 C)  98.2 F (36.8 C) 97.7 F (36.5 C)  TempSrc: Oral  Oral Oral  SpO2: 100%  100% 100%  Weight:  85.5 kg    Height:        Intake/Output Summary (Last 24 hours) at 10/10/2020 0920 Last data filed at 10/10/2020 0818 Gross per 24 hour  Intake 1200 ml  Output 150 ml  Net 1050 ml   Filed Weights   10/08/20 1715 10/09/20 0936 10/10/20 0500  Weight: 89.8  kg 85.9 kg 85.5 kg    Telemetry    SR but has had ASVS through course - Personally Reviewed  ECG    No new - Personally Reviewed  Physical Exam   GEN: No acute distress.   Neck: No JVD Cardiac: RRR, no murmurs, rubs, or gallops.  Respiratory: Clear to auscultation bilaterally. GI: Soft, nontender, non-distended  MS: No edema; No deformity. Neuro:  Nonfocal  Psych: Normal affect   Labs    Chemistry Recent Labs  Lab 10/08/20 1723 10/09/20 0934  NA 136 137  K 3.5 4.1  CL 93* 96*  CO2 36* 32  GLUCOSE 241* 109*  BUN 17 25*  CREATININE 4.96* 6.01*  CALCIUM 9.1 9.1  PROT 7.3  --   ALBUMIN 3.6  --   AST 31  --   ALT 24  --   ALKPHOS 82  --   BILITOT 0.7  --   GFRNONAA 12* 10*  ANIONGAP 7 9     Hematology Recent Labs  Lab 10/08/20 1723  WBC 4.7  RBC 3.47*  HGB 10.5*  HCT 32.8*  MCV 94.5  MCH 30.3  MCHC 32.0  RDW 12.8  PLT  173    Cardiac EnzymesNo results for input(s): TROPONINI in the last 168 hours. No results for input(s): TROPIPOC in the last 168 hours.   BNPNo results for input(s): BNP, PROBNP in the last 168 hours.   DDimer No results for input(s): DDIMER in the last 168 hours.   Radiology    DG Chest 2 View  Result Date: 10/08/2020 CLINICAL DATA:  Syncope. EXAM: CHEST - 2 VIEW COMPARISON:  March 08, 2020. FINDINGS: The heart size and mediastinal contours are within normal limits. Both lungs are clear. No pneumothorax or pleural effusion is noted. Right-sided pacemaker is unchanged in position. The visualized skeletal structures are unremarkable. IMPRESSION: No active cardiopulmonary disease. Electronically Signed   By: Marijo Conception M.D.   On: 10/08/2020 18:32   CT Head Wo Contrast  Result Date: 10/08/2020 CLINICAL DATA:  Syncope. EXAM: CT HEAD WITHOUT CONTRAST CT CERVICAL SPINE WITHOUT CONTRAST TECHNIQUE: Multidetector CT imaging of the head and cervical spine was performed following the standard protocol without intravenous contrast.  Multiplanar CT image reconstructions of the cervical spine were also generated. COMPARISON:  July 22, 2018. FINDINGS: CT HEAD FINDINGS Brain: Mild diffuse cortical atrophy is noted. Mild chronic ischemic white matter disease is noted. No mass effect or midline shift is noted. Ventricular size is within normal limits. There is no evidence of mass lesion, hemorrhage or acute infarction. Vascular: No hyperdense vessel or unexpected calcification. Skull: Normal. Negative for fracture or focal lesion. Sinuses/Orbits: No acute finding. Other: None. CT CERVICAL SPINE FINDINGS Alignment: Normal. Skull base and vertebrae: No acute fracture. No primary bone lesion or focal pathologic process. Soft tissues and spinal canal: No prevertebral fluid or swelling. No visible canal hematoma. Disc levels: Severe degenerative disc disease is noted at C6-7 and C7-T1. Upper chest: Negative. Other: None. IMPRESSION: No acute intracranial abnormality seen. Severe multilevel degenerative disc disease is noted in the cervical spine. No acute abnormality is noted. Electronically Signed   By: Marijo Conception M.D.   On: 10/08/2020 18:09   CT Cervical Spine Wo Contrast  Result Date: 10/08/2020 CLINICAL DATA:  Syncope. EXAM: CT HEAD WITHOUT CONTRAST CT CERVICAL SPINE WITHOUT CONTRAST TECHNIQUE: Multidetector CT imaging of the head and cervical spine was performed following the standard protocol without intravenous contrast. Multiplanar CT image reconstructions of the cervical spine were also generated. COMPARISON:  July 22, 2018. FINDINGS: CT HEAD FINDINGS Brain: Mild diffuse cortical atrophy is noted. Mild chronic ischemic white matter disease is noted. No mass effect or midline shift is noted. Ventricular size is within normal limits. There is no evidence of mass lesion, hemorrhage or acute infarction. Vascular: No hyperdense vessel or unexpected calcification. Skull: Normal. Negative for fracture or focal lesion. Sinuses/Orbits: No  acute finding. Other: None. CT CERVICAL SPINE FINDINGS Alignment: Normal. Skull base and vertebrae: No acute fracture. No primary bone lesion or focal pathologic process. Soft tissues and spinal canal: No prevertebral fluid or swelling. No visible canal hematoma. Disc levels: Severe degenerative disc disease is noted at C6-7 and C7-T1. Upper chest: Negative. Other: None. IMPRESSION: No acute intracranial abnormality seen. Severe multilevel degenerative disc disease is noted in the cervical spine. No acute abnormality is noted. Electronically Signed   By: Marijo Conception M.D.   On: 10/08/2020 18:09   ECHOCARDIOGRAM COMPLETE  Result Date: 10/09/2020    ECHOCARDIOGRAM REPORT   Patient Name:   Gregory Hood Date of Exam: 10/09/2020 Medical Rec #:  [id number]    Height:  70.0 in Accession #:    [id number]   Weight:       189.4 lb Date of Birth:  1950/08/15    BSA:          2.039 m Patient Age:    31 years     BP:           159/94 mmHg Patient Gender: M            HR:           79 bpm. Exam Location:  Inpatient Procedure: 2D Echo, Cardiac Doppler and Color Doppler Indications:    R55 Syncope  History:        Patient has prior history of Echocardiogram examinations, most                 recent 03/23/2018. Stroke, TIA and COPD; Risk                 Factors:Hypertension, Diabetes, Dyslipidemia and Sleep Apnea.                 CKD.  Sonographer:    Jonelle [name] Referring Phys: [place]  1. Left ventricular ejection fraction, by estimation, is 55 to 60%. The left ventricle has normal function. The left ventricle has no regional wall motion abnormalities. There is moderate left ventricular hypertrophy. Left ventricular diastolic parameters are consistent with Grade I diastolic dysfunction (impaired relaxation).  2. Right ventricular systolic function is normal. The right ventricular size is normal. Tricuspid regurgitation signal is inadequate for assessing PA pressure.  3. Left atrial size was  moderately dilated.  4. The mitral valve is abnormal. Trivial mitral valve regurgitation. No evidence of mitral stenosis. Moderate mitral annular calcification.  5. The aortic valve is tricuspid. Aortic valve regurgitation is not visualized. Mild aortic valve sclerosis is present, with no evidence of aortic valve stenosis.  6. Aortic dilatation noted. There is mild dilatation of the aortic root and of the ascending aorta, measuring 43 mm.  7. The inferior vena cava is normal in size with greater than 50% respiratory variability, suggesting right atrial pressure of 3 mmHg. FINDINGS  Left Ventricle: Left ventricular ejection fraction, by estimation, is 55 to 60%. The left ventricle has normal function. The left ventricle has no regional wall motion abnormalities. The left ventricular internal cavity size was normal in size. There is  moderate left ventricular hypertrophy. Left ventricular diastolic parameters are consistent with Grade I diastolic dysfunction (impaired relaxation). Right Ventricle: The right ventricular size is normal. No increase in right ventricular wall thickness. Right ventricular systolic function is normal. Tricuspid regurgitation signal is inadequate for assessing PA pressure. Left Atrium: Left atrial size was moderately dilated. Right Atrium: Right atrial size was normal in size. Pericardium: There is no evidence of pericardial effusion. Mitral Valve: The mitral valve is abnormal. There is moderate calcification of the mitral valve leaflet(s). Moderate mitral annular calcification. Trivial mitral valve regurgitation. No evidence of mitral valve stenosis. Tricuspid Valve: The tricuspid valve is normal in structure. Tricuspid valve regurgitation is not demonstrated. Aortic Valve: The aortic valve is tricuspid. Aortic valve regurgitation is not visualized. Mild aortic valve sclerosis is present, with no evidence of aortic valve stenosis. Pulmonic Valve: The pulmonic valve was normal in structure.  Pulmonic valve regurgitation is not visualized. Aorta: Aortic dilatation noted. There is mild dilatation of the aortic root and of the ascending aorta, measuring 43 mm. Venous: The inferior vena cava is normal in size with  greater than 50% respiratory variability, suggesting right atrial pressure of 3 mmHg. IAS/Shunts: No atrial level shunt detected by color flow Doppler. Additional Comments: A device lead is visualized in the right ventricle.  LEFT VENTRICLE PLAX 2D LVIDd:         3.80 cm  Diastology LVIDs:         2.70 cm  LV e' medial:    3.57 cm/s LV PW:         1.40 cm  LV E/e' medial:  17.4 LV IVS:        1.70 cm  LV e' lateral:   5.35 cm/s LVOT diam:     2.20 cm  LV E/e' lateral: 11.6 LV SV:         75 LV SV Index:   37 LVOT Area:     3.80 cm  RIGHT VENTRICLE            IVC RV Basal diam:  2.60 cm    IVC diam: 1.40 cm RV S prime:     9.00 cm/s TAPSE (M-mode): 1.8 cm LEFT ATRIUM              Index       RIGHT ATRIUM           Index LA diam:        4.80 cm  2.35 cm/m  RA Area:     13.60 cm LA Vol (A2C):   122.0 ml 59.82 ml/m RA Volume:   31.30 ml  15.35 ml/m LA Vol (A4C):   53.3 ml  26.13 ml/m LA Biplane Vol: 83.4 ml  40.89 ml/m  AORTIC VALVE LVOT Vmax:   86.50 cm/s LVOT Vmean:  53.600 cm/s LVOT VTI:    0.197 m  AORTA Ao Root diam: 4.30 cm Ao Asc diam:  4.30 cm MITRAL VALVE MV Area (PHT): 2.45 cm    SHUNTS MV Decel Time: 310 msec    Systemic VTI:  0.20 m MV E velocity: 62.10 cm/s  Systemic Diam: 2.20 cm MV A velocity: 90.70 cm/s MV E/A ratio:  0.68 Loralie Champagne MD Electronically signed by Loralie Champagne MD Signature Date/Time: 10/09/2020/6:23:29 PM    Final     Cardiac Studies   Transthoracic Echocardiogram: Date: 10/10/20 Results: Mild AA dilation 43 mm 1. Left ventricular ejection fraction, by estimation, is 55 to 60%. The  left ventricle has normal function. The left ventricle has no regional  wall motion abnormalities. There is moderate left ventricular hypertrophy.  Left ventricular  diastolic  parameters are consistent with Grade I diastolic dysfunction (impaired  relaxation).  2. Right ventricular systolic function is normal. The right ventricular  size is normal. Tricuspid regurgitation signal is inadequate for assessing  PA pressure.  3. Left atrial size was moderately dilated.  4. The mitral valve is abnormal. Trivial mitral valve regurgitation. No  evidence of mitral stenosis. Moderate mitral annular calcification.  5. The aortic valve is tricuspid. Aortic valve regurgitation is not  visualized. Mild aortic valve sclerosis is present, with no evidence of  aortic valve stenosis.  6. Aortic dilatation noted. There is mild dilatation of the aortic root  and of the ascending aorta, measuring 43 mm.  7. The inferior vena cava is normal in size with greater than 50%  respiratory variability, suggesting right atrial pressure of 3 mmHg.    Patient Profile     70 y.o. male HFpEF, HTN, HLD, ESRD (LUE with AV Fistula) PAD, history of Pr L thalamic CVA,  dementia NOS with syncope in the setting of vomiting  Assessment & Plan    Syncope and collapse- possible vasovagal syncope SSS s/p MDT DC PPM HFpEF HTN with DM Dementia on Memantine Hx of CVA with blindness PAD NOS Mild Thoracic Aortic aneurysm, AAHI 2.41 cm/m - no evidence of arrhythmogenic cause - with evidence of structural heart damage - Novel troponin < 100; doesn't appear consistent with ACS - most consistent with vasovagal syncope presently - home DAPT is reasonable but unclear long term indication- managed at [facility] - will continue low dose Coreg 3.125 mg PO BID (historyically took 6.25 on non HD days) - continue home Zetia - if BP elevates despite HD, may consider return of hydralazine 10 mg Po TID instead of full 25 mg PO TID to start - if no further events, this does not necessarily pre-clude him from his upcoming GI surgery in at [place] ([place]) on 12/05/20  Possible 10/11/20 Sign off if no further  events       For questions or updates, please contact [facility] Please consult [email or web address] for contact info under Cardiology/STEMI.      Signed, Werner Lean, MD  10/10/2020, 9:20 AM

## 2020-10-10 NOTE — Progress Notes (Signed)
[name] Progress Note    Assessment/ Plan:   1.  Hypotension/syncope: Arrhythmia v. hypotension. Echo pending. Cardiology on board. Coreg/hydralazine on hold. Will raise his dry weight with dialysis to see if helps. Ppm interrogation pending 2.  ESRD: Continue HD per MWF schedule - next HD today, no heparin. 3.  Hypotension/volume: As above, holding BP meds and will ^ EDW 1kg (to 88kg) 4.  Anemia: Hgb 10.5 - no ESA for now. 5.  Metabolic bone disease: Ca ok, Phos pending. Continue home meds. 6.  Nutrition: Alb 3.6 - fine. Appetite ok, but having ongoing weight loss.  7.  T2DM: A1C 6.1%  8.  Hx sick sinus syndrome s/p dual chamber pacemaker-ppm interrogation pending 9.  Hx CVA: on dapt, allergic to statins 10.  Dementia-per primary   Subjective:   No acute events overnight, seen and examined bedside. Doesn't recognize me from yesterday. Refused cpap overnight. Has baseline dementia.   Objective:   BP 120/60 (BP Location: Right Arm)   Pulse 73   Temp 97.7 F (36.5 C) (Oral)   Resp 16   Ht '5\' 10"'$  (1.778 m)   Wt 85.5 kg   SpO2 100%   BMI 27.03 kg/m   Intake/Output Summary (Last 24 hours) at 10/10/2020 0848 Last data filed at 10/10/2020 0818 Gross per 24 hour  Intake 1200 ml  Output 150 ml  Net 1050 ml   Weight change: -3.912 kg  Physical Exam: Gen:nad, laying flat in bed [id number], rrr, +systolic murmur Resp:cta bl, no w/r/r/c, unlabored, bl chest expansion [id number], nt/nd Ext:no edema Neuro: flat affect, speech clear and coherent, moves ext spontaneously Dialysis Access: LUE AVG +bruit   Imaging: DG Chest 2 View  Result Date: 10/08/2020 CLINICAL DATA:  Syncope. EXAM: CHEST - 2 VIEW COMPARISON:  March 08, 2020. FINDINGS: The heart size and mediastinal contours are within normal limits. Both lungs are clear. No pneumothorax or pleural effusion is noted. Right-sided pacemaker is unchanged in position. The visualized skeletal structures are  unremarkable. IMPRESSION: No active cardiopulmonary disease. Electronically Signed   By: Marijo Conception M.D.   On: 10/08/2020 18:32   CT Head Wo Contrast  Result Date: 10/08/2020 CLINICAL DATA:  Syncope. EXAM: CT HEAD WITHOUT CONTRAST CT CERVICAL SPINE WITHOUT CONTRAST TECHNIQUE: Multidetector CT imaging of the head and cervical spine was performed following the standard protocol without intravenous contrast. Multiplanar CT image reconstructions of the cervical spine were also generated. COMPARISON:  July 22, 2018. FINDINGS: CT HEAD FINDINGS Brain: Mild diffuse cortical atrophy is noted. Mild chronic ischemic white matter disease is noted. No mass effect or midline shift is noted. Ventricular size is within normal limits. There is no evidence of mass lesion, hemorrhage or acute infarction. Vascular: No hyperdense vessel or unexpected calcification. Skull: Normal. Negative for fracture or focal lesion. Sinuses/Orbits: No acute finding. Other: None. CT CERVICAL SPINE FINDINGS Alignment: Normal. Skull base and vertebrae: No acute fracture. No primary bone lesion or focal pathologic process. Soft tissues and spinal canal: No prevertebral fluid or swelling. No visible canal hematoma. Disc levels: Severe degenerative disc disease is noted at C6-7 and C7-T1. Upper chest: Negative. Other: None. IMPRESSION: No acute intracranial abnormality seen. Severe multilevel degenerative disc disease is noted in the cervical spine. No acute abnormality is noted. Electronically Signed   By: Marijo Conception M.D.   On: 10/08/2020 18:09   CT Cervical Spine Wo Contrast  Result Date: 10/08/2020 CLINICAL DATA:  Syncope. EXAM: CT HEAD WITHOUT CONTRAST CT  CERVICAL SPINE WITHOUT CONTRAST TECHNIQUE: Multidetector CT imaging of the head and cervical spine was performed following the standard protocol without intravenous contrast. Multiplanar CT image reconstructions of the cervical spine were also generated. COMPARISON:  July 22, 2018. FINDINGS: CT HEAD FINDINGS Brain: Mild diffuse cortical atrophy is noted. Mild chronic ischemic white matter disease is noted. No mass effect or midline shift is noted. Ventricular size is within normal limits. There is no evidence of mass lesion, hemorrhage or acute infarction. Vascular: No hyperdense vessel or unexpected calcification. Skull: Normal. Negative for fracture or focal lesion. Sinuses/Orbits: No acute finding. Other: None. CT CERVICAL SPINE FINDINGS Alignment: Normal. Skull base and vertebrae: No acute fracture. No primary bone lesion or focal pathologic process. Soft tissues and spinal canal: No prevertebral fluid or swelling. No visible canal hematoma. Disc levels: Severe degenerative disc disease is noted at C6-7 and C7-T1. Upper chest: Negative. Other: None. IMPRESSION: No acute intracranial abnormality seen. Severe multilevel degenerative disc disease is noted in the cervical spine. No acute abnormality is noted. Electronically Signed   By: Marijo Conception M.D.   On: 10/08/2020 18:09   ECHOCARDIOGRAM COMPLETE  Result Date: 10/09/2020    ECHOCARDIOGRAM REPORT   Patient Name:   Gregory Hood Date of Exam: 10/09/2020 Medical Rec #:  [id number]    Height:       70.0 in Accession #:    [id number]   Weight:       189.4 lb Date of Birth:  May 25, 1951    BSA:          2.039 m Patient Age:    70 years     BP:           159/94 mmHg Patient Gender: M            HR:           79 bpm. Exam Location:  Inpatient Procedure: 2D Echo, Cardiac Doppler and Color Doppler Indications:    R55 Syncope  History:        Patient has prior history of Echocardiogram examinations, most                 recent 03/23/2018. Stroke, TIA and COPD; Risk                 Factors:Hypertension, Diabetes, Dyslipidemia and Sleep Apnea.                 CKD.  Sonographer:    Jonelle [name] Referring Phys: [name]  1. Left ventricular ejection fraction, by estimation, is 55 to 60%. The left ventricle has normal  function. The left ventricle has no regional wall motion abnormalities. There is moderate left ventricular hypertrophy. Left ventricular diastolic parameters are consistent with Grade I diastolic dysfunction (impaired relaxation).  2. Right ventricular systolic function is normal. The right ventricular size is normal. Tricuspid regurgitation signal is inadequate for assessing PA pressure.  3. Left atrial size was moderately dilated.  4. The mitral valve is abnormal. Trivial mitral valve regurgitation. No evidence of mitral stenosis. Moderate mitral annular calcification.  5. The aortic valve is tricuspid. Aortic valve regurgitation is not visualized. Mild aortic valve sclerosis is present, with no evidence of aortic valve stenosis.  6. Aortic dilatation noted. There is mild dilatation of the aortic root and of the ascending aorta, measuring 43 mm.  7. The inferior vena cava is normal in size with greater than 50% respiratory variability, suggesting right atrial  pressure of 3 mmHg. FINDINGS  Left Ventricle: Left ventricular ejection fraction, by estimation, is 55 to 60%. The left ventricle has normal function. The left ventricle has no regional wall motion abnormalities. The left ventricular internal cavity size was normal in size. There is  moderate left ventricular hypertrophy. Left ventricular diastolic parameters are consistent with Grade I diastolic dysfunction (impaired relaxation). Right Ventricle: The right ventricular size is normal. No increase in right ventricular wall thickness. Right ventricular systolic function is normal. Tricuspid regurgitation signal is inadequate for assessing PA pressure. Left Atrium: Left atrial size was moderately dilated. Right Atrium: Right atrial size was normal in size. Pericardium: There is no evidence of pericardial effusion. Mitral Valve: The mitral valve is abnormal. There is moderate calcification of the mitral valve leaflet(s). Moderate mitral annular calcification.  Trivial mitral valve regurgitation. No evidence of mitral valve stenosis. Tricuspid Valve: The tricuspid valve is normal in structure. Tricuspid valve regurgitation is not demonstrated. Aortic Valve: The aortic valve is tricuspid. Aortic valve regurgitation is not visualized. Mild aortic valve sclerosis is present, with no evidence of aortic valve stenosis. Pulmonic Valve: The pulmonic valve was normal in structure. Pulmonic valve regurgitation is not visualized. Aorta: Aortic dilatation noted. There is mild dilatation of the aortic root and of the ascending aorta, measuring 43 mm. Venous: The inferior vena cava is normal in size with greater than 50% respiratory variability, suggesting right atrial pressure of 3 mmHg. IAS/Shunts: No atrial level shunt detected by color flow Doppler. Additional Comments: A device lead is visualized in the right ventricle.  LEFT VENTRICLE PLAX 2D LVIDd:         3.80 cm  Diastology LVIDs:         2.70 cm  LV e' medial:    3.57 cm/s LV PW:         1.40 cm  LV E/e' medial:  17.4 LV IVS:        1.70 cm  LV e' lateral:   5.35 cm/s LVOT diam:     2.20 cm  LV E/e' lateral: 11.6 LV SV:         75 LV SV Index:   37 LVOT Area:     3.80 cm  RIGHT VENTRICLE            IVC RV Basal diam:  2.60 cm    IVC diam: 1.40 cm RV S prime:     9.00 cm/s TAPSE (M-mode): 1.8 cm LEFT ATRIUM              Index       RIGHT ATRIUM           Index LA diam:        4.80 cm  2.35 cm/m  RA Area:     13.60 cm LA Vol (A2C):   122.0 ml 59.82 ml/m RA Volume:   31.30 ml  15.35 ml/m LA Vol (A4C):   53.3 ml  26.13 ml/m LA Biplane Vol: 83.4 ml  40.89 ml/m  AORTIC VALVE LVOT Vmax:   86.50 cm/s LVOT Vmean:  53.600 cm/s LVOT VTI:    0.197 m  AORTA Ao Root diam: 4.30 cm Ao Asc diam:  4.30 cm MITRAL VALVE MV Area (PHT): 2.45 cm    SHUNTS MV Decel Time: 310 msec    Systemic VTI:  0.20 m MV E velocity: 62.10 cm/s  Systemic Diam: 2.20 cm MV A velocity: 90.70 cm/s MV E/A ratio:  0.68 Loralie Champagne MD Electronically signed by  Loralie Champagne MD Signature Date/Time: 10/09/2020/6:23:29 PM    Final     Labs: BMET Recent Labs  Lab 10/08/20 1723 10/09/20 0934  NA 136 137  K 3.5 4.1  CL 93* 96*  CO2 36* 32  GLUCOSE 241* 109*  BUN 17 25*  CREATININE 4.96* 6.01*  CALCIUM 9.1 9.1   CBC Recent Labs  Lab 10/08/20 1723  WBC 4.7  NEUTROABS 3.3  HGB 10.5*  HCT 32.8*  MCV 94.5  PLT 173    Medications:    . (feeding supplement) PROSource Plus  30 mL Oral BID BM  . acetaminophen  500 mg Oral Daily  . aspirin  81 mg Oral QHS  . calcium acetate  667 mg Oral TID AC  . carvedilol  3.125 mg Oral BID WC  . Chlorhexidine Gluconate Cloth  6 each Topical Q0600  . cholecalciferol  2,000 Units Oral Daily  . clopidogrel  75 mg Oral Daily  . [START ON 10/21/2020] cyanocobalamin  1,000 mcg Intramuscular Q30 days  . ezetimibe  10 mg Oral QPM  . feeding supplement (NEPRO CARB STEADY)  237 mL Oral BID BM  . finasteride  5 mg Oral Daily  . folic acid  1 mg Oral Daily  . heparin  5,000 Units Subcutaneous Q8H  . insulin aspart  0-9 Units Subcutaneous TID WC  . memantine  10 mg Oral BID  . multivitamin  1 tablet Oral QHS  . niacin  200 mg Oral QPM  . prednisoLONE acetate  1 drop Left Eye BID  . senna-docusate  1 tablet Oral BID  . sodium chloride flush  3 mL Intravenous Q12H      Gean Quint, MD [facility] 10/10/2020, 8:48 AM

## 2020-10-11 DIAGNOSIS — Z794 Long term (current) use of insulin: Secondary | ICD-10-CM

## 2020-10-11 LAB — GLUCOSE, CAPILLARY
Glucose-Capillary: 198 mg/dL — ABNORMAL HIGH (ref 70–99)
Glucose-Capillary: 109 mg/dL — ABNORMAL HIGH (ref 70–99)
Glucose-Capillary: 132 mg/dL — ABNORMAL HIGH (ref 70–99)

## 2020-10-11 MED ORDER — NEPRO/CARBSTEADY PO LIQD: 237.0000 mL | Freq: Two times a day (BID) | ORAL | 60 refills | Status: DC

## 2020-10-11 MED ORDER — PROSOURCE PLUS PO LIQD: 30.0000 mL | Freq: Two times a day (BID) | ORAL | 0 refills | Status: DC

## 2020-10-11 MED ORDER — CARVEDILOL 3.125 MG PO TABS: 3.1250 mg | ORAL_TABLET | Freq: Two times a day (BID) | ORAL | 0 refills | Status: DC

## 2020-10-11 NOTE — Progress Notes (Signed)
[facility] Progress Note    Assessment/ Plan:   1.  Hypotension/syncope: Arrhythmia v. hypotension. Echo pending. Cardiology on board. Coreg/hydralazine on hold. Will raise his dry weight with dialysis to see if helps. Ppm interrogation pending 2.  ESRD: Continue HD per MWF schedule. Okay from a nephrology perspective for d/c otherwise HD mon if he's still here 3.  Hypotension/volume: As above, holding BP meds and inc'ed EDW 1kg (to 88kg). Can target this EDW at his outpatient unit, will adjust accordingly 4.  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 5.  Metabolic bone disease/secondary hyperparathyroidism: Ca ok, pls check phos/RFPs while he's here. Continue home meds. 6.  Nutrition: Alb 3.6 - fine. Appetite ok, but having ongoing weight loss. , push protein 7.  T2DM: A1C 6.1% -per primary svc 8.  Hx sick sinus syndrome s/p dual chamber pacemaker-ppm interrogation pending 9.  Hx CVA: on dapt, allergic to statins 10.  Dementia-per primary   Subjective:   No acute events overnight, seen and examined bedside. Tolerated HD yesterday, net uf 0.5L. No complaints. He wants to be discharged ("I'm ready to get out of here)   Objective:   BP 136/72 (BP Location: Right Arm)   Pulse 72   Temp 98 F (36.7 C) (Oral)   Resp 20   Ht '5\' 10"'$  (1.778 m)   Wt 85.7 kg   SpO2 100%   BMI 27.12 kg/m   Intake/Output Summary (Last 24 hours) at 10/11/2020 0801 Last data filed at 10/10/2020 2300 Gross per 24 hour  Intake 723 ml  Output 700 ml  Net 23 ml   Weight change: 1 kg  Physical Exam: Gen:nad, sitting up in bed [id number], rrr, +systolic murmur Resp: no iwob, no acc muscle use, speaking in full sentences, unlabored [id number], nt/nd Ext:no edema Neuro: flat affect, speech clear and coherent, moves ext spontaneously Dialysis Access: LUE AVG +bruit   Imaging: ECHOCARDIOGRAM COMPLETE  Result Date: 10/09/2020    ECHOCARDIOGRAM REPORT   Patient Name:   Gregory Hood Date of  Exam: 10/09/2020 Medical Rec #:  [id number]    Height:       70.0 in Accession #:    [id number]   Weight:       189.4 lb Date of Birth:  23-Apr-1951    BSA:          2.039 m Patient Age:    70 years     BP:           159/94 mmHg Patient Gender: M            HR:           79 bpm. Exam Location:  Inpatient Procedure: 2D Echo, Cardiac Doppler and Color Doppler Indications:    R55 Syncope  History:        Patient has prior history of Echocardiogram examinations, most                 recent 03/23/2018. Stroke, TIA and COPD; Risk                 Factors:Hypertension, Diabetes, Dyslipidemia and Sleep Apnea.                 CKD.  Sonographer:    Jonelle [name] Referring Phys: [place]  1. Left ventricular ejection fraction, by estimation, is 55 to 60%. The left ventricle has normal function. The left ventricle has no regional wall motion abnormalities. There  is moderate left ventricular hypertrophy. Left ventricular diastolic parameters are consistent with Grade I diastolic dysfunction (impaired relaxation).  2. Right ventricular systolic function is normal. The right ventricular size is normal. Tricuspid regurgitation signal is inadequate for assessing PA pressure.  3. Left atrial size was moderately dilated.  4. The mitral valve is abnormal. Trivial mitral valve regurgitation. No evidence of mitral stenosis. Moderate mitral annular calcification.  5. The aortic valve is tricuspid. Aortic valve regurgitation is not visualized. Mild aortic valve sclerosis is present, with no evidence of aortic valve stenosis.  6. Aortic dilatation noted. There is mild dilatation of the aortic root and of the ascending aorta, measuring 43 mm.  7. The inferior vena cava is normal in size with greater than 50% respiratory variability, suggesting right atrial pressure of 3 mmHg. FINDINGS  Left Ventricle: Left ventricular ejection fraction, by estimation, is 55 to 60%. The left ventricle has normal function. The left ventricle  has no regional wall motion abnormalities. The left ventricular internal cavity size was normal in size. There is  moderate left ventricular hypertrophy. Left ventricular diastolic parameters are consistent with Grade I diastolic dysfunction (impaired relaxation). Right Ventricle: The right ventricular size is normal. No increase in right ventricular wall thickness. Right ventricular systolic function is normal. Tricuspid regurgitation signal is inadequate for assessing PA pressure. Left Atrium: Left atrial size was moderately dilated. Right Atrium: Right atrial size was normal in size. Pericardium: There is no evidence of pericardial effusion. Mitral Valve: The mitral valve is abnormal. There is moderate calcification of the mitral valve leaflet(s). Moderate mitral annular calcification. Trivial mitral valve regurgitation. No evidence of mitral valve stenosis. Tricuspid Valve: The tricuspid valve is normal in structure. Tricuspid valve regurgitation is not demonstrated. Aortic Valve: The aortic valve is tricuspid. Aortic valve regurgitation is not visualized. Mild aortic valve sclerosis is present, with no evidence of aortic valve stenosis. Pulmonic Valve: The pulmonic valve was normal in structure. Pulmonic valve regurgitation is not visualized. Aorta: Aortic dilatation noted. There is mild dilatation of the aortic root and of the ascending aorta, measuring 43 mm. Venous: The inferior vena cava is normal in size with greater than 50% respiratory variability, suggesting right atrial pressure of 3 mmHg. IAS/Shunts: No atrial level shunt detected by color flow Doppler. Additional Comments: A device lead is visualized in the right ventricle.  LEFT VENTRICLE PLAX 2D LVIDd:         3.80 cm  Diastology LVIDs:         2.70 cm  LV e' medial:    3.57 cm/s LV PW:         1.40 cm  LV E/e' medial:  17.4 LV IVS:        1.70 cm  LV e' lateral:   5.35 cm/s LVOT diam:     2.20 cm  LV E/e' lateral: 11.6 LV SV:         75 LV SV  Index:   37 LVOT Area:     3.80 cm  RIGHT VENTRICLE            IVC RV Basal diam:  2.60 cm    IVC diam: 1.40 cm RV S prime:     9.00 cm/s TAPSE (M-mode): 1.8 cm LEFT ATRIUM              Index       RIGHT ATRIUM           Index LA diam:  4.80 cm  2.35 cm/m  RA Area:     13.60 cm LA Vol (A2C):   122.0 ml 59.82 ml/m RA Volume:   31.30 ml  15.35 ml/m LA Vol (A4C):   53.3 ml  26.13 ml/m LA Biplane Vol: 83.4 ml  40.89 ml/m  AORTIC VALVE LVOT Vmax:   86.50 cm/s LVOT Vmean:  53.600 cm/s LVOT VTI:    0.197 m  AORTA Ao Root diam: 4.30 cm Ao Asc diam:  4.30 cm MITRAL VALVE MV Area (PHT): 2.45 cm    SHUNTS MV Decel Time: 310 msec    Systemic VTI:  0.20 m MV E velocity: 62.10 cm/s  Systemic Diam: 2.20 cm MV A velocity: 90.70 cm/s MV E/A ratio:  0.68 Loralie Champagne MD Electronically signed by Loralie Champagne MD Signature Date/Time: 10/09/2020/6:23:29 PM    Final     Labs: BMET Recent Labs  Lab 10/08/20 1723 10/09/20 0934  NA 136 137  K 3.5 4.1  CL 93* 96*  CO2 36* 32  GLUCOSE 241* 109*  BUN 17 25*  CREATININE 4.96* 6.01*  CALCIUM 9.1 9.1   CBC Recent Labs  Lab 10/08/20 1723  WBC 4.7  NEUTROABS 3.3  HGB 10.5*  HCT 32.8*  MCV 94.5  PLT 173    Medications:    . (feeding supplement) PROSource Plus  30 mL Oral BID BM  . acetaminophen  500 mg Oral Daily  . aspirin  81 mg Oral QHS  . calcium acetate  667 mg Oral TID AC  . carvedilol  3.125 mg Oral BID WC  . Chlorhexidine Gluconate Cloth  6 each Topical Q0600  . cholecalciferol  2,000 Units Oral Daily  . clopidogrel  75 mg Oral Daily  . [START ON 10/21/2020] cyanocobalamin  1,000 mcg Intramuscular Q30 days  . ezetimibe  10 mg Oral QPM  . feeding supplement (NEPRO CARB STEADY)  237 mL Oral BID BM  . finasteride  5 mg Oral Daily  . folic acid  1 mg Oral Daily  . heparin  5,000 Units Subcutaneous Q8H  . insulin aspart  0-9 Units Subcutaneous TID WC  . memantine  10 mg Oral BID  . multivitamin  1 tablet Oral QHS  . niacin  200 mg Oral  QPM  . prednisoLONE acetate  1 drop Left Eye BID  . senna-docusate  1 tablet Oral BID  . sodium chloride flush  3 mL Intravenous Q12H      Gean Quint, MD [facility] 10/11/2020, 8:01 AM

## 2020-10-11 NOTE — Discharge Summary (Signed)
Physician Discharge Summary  Gregory Hood [id number] DOB: October 23, 1950 DOA: 10/08/2020  PCP: Jilda Panda, MD  Admit date: 10/08/2020 Discharge date: 10/11/2020  Admitted From: SNF Disposition:  SNF   Recommendations for Outpatient Follow-up:  1. F/u BP daily  Home Health:  From SNF  Discharge Condition:  stable   CODE STATUS:  Full code   Diet recommendation:  Renal, heart healthy and diabetic Consultations:  Nephrology  Cardiology   Procedures/Studies: . Pacer interrogation   Discharge Diagnoses:  Principal Problem:   Syncope  Active Problems:   Bradycardia   Essential hypertension, benign   DM (diabetes mellitus), type 2 with renal complications (HCC)   Chronic diastolic heart failure (HCC)   End stage renal disease (HCC)   Dementia   H/o CVAs  Brief Summary: Gregory Hood is a 70 year old male with a history of multiple prior strokes, uncontrolled diabetes mellitus type 2, hypertension, Alzheimer's dementia, end-stage renal disease on dialysis, SSS s/p pacer who has been in a nursing facility for at least 2 years now. The patient presents for syncope and an episode of vomiting that occurred after dialysis once he arrived back to his nursing facility. He does not remember it. When EMS arrived they noticed heart rate was in the 30s and BP was 69 over palp. Since wife states that the day before when she took him to [facility] for a appointment with a surgeon, his systolic blood pressure was noted to be in the 80s and he was extremely weak.  After drinking some soda and water his blood pressure improved and so did his weakness.  She was told by the surgeon that they might be pulling too much fluid off during dialysis. The patient was admitted for further work-up for his syncope.  For the bradycardia, cardiology was consulted.  Hospital Course:  Principal Problem:   Syncope - Work up complete- it appears this was likely due to hypotension - I have consulted nephrology to see if  they would like to adjust the amount of fluid removed with dialysis or adjust his blood pressure medications - His Coreg has been reduced and Hydralazine discontinued in coordination with nephro and cardiology - dry weight is to be 88kg per nephro notes - discussed plan with his wife  Active Problems: Bradycardia- with wide-complex tachycardia noted on 4/13 - Interrogation of his pacemaker revealed that this rhythm was related to  V pacing  - no arrhthymias identified - cardiology feels he may have had a vasovagal reaction around the time he vomited, resulting in bradycardia and causing his pacer to begin V pacing    Essential hypertension, benign  - see above regarding syncope  End-stage renal disease - Dialysis days are Tuesday Thursday Saturday -He received a full dialysis yesterday without hypotension or syncope - Nephrology consulted    DM (diabetes mellitus), type 2 with renal complications -Outpatient medication includes Humalog twice daily per sliding scale - A1c on 4/13> 6.1  Alzheimer's dementia - poor historian due to dementia - Continue Namenda  Showed multiple CVAs - Continue aspirin, Plavix and Zetia  Legally blind - completely blind in left eye and also partly blind in right eye  SSS s/p dual chamber pacer    Discharge Exam: Vitals:   10/11/20 0332 10/11/20 0711  BP: 125/69 136/72  Pulse: 71 72  Resp: 18 20  Temp: 98.2 F (36.8 C) 98 F (36.7 C)  SpO2: 100% 100%   Vitals:   10/10/20 1929 10/11/20 0030 10/11/20 0332 10/11/20  0711  BP: 130/66 125/72 125/69 136/72  Pulse: 73 79 71 72  Resp: '19 18 18 20  '$ Temp: 98.8 F (37.1 C) 98.6 F (37 C) 98.2 F (36.8 C) 98 F (36.7 C)  TempSrc: Oral Oral Oral Oral  SpO2: 100% 99% 100% 100%  Weight:   85.7 kg   Height:        General: Pt is alert, awake, not in acute distress Cardiovascular: RRR, S1/S2 +, no rubs, no gallops Respiratory: CTA bilaterally, no wheezing, no rhonchi Abdominal:  Soft, NT, ND, bowel sounds + Extremities: no edema, no cyanosis   Discharge Instructions  Discharge Instructions    Increase activity slowly   Complete by: As directed      Allergies as of 10/11/2020      Reactions   Bee Venom Swelling   Lexiscan [regadenoson] Other (See Comments)   Seizure like-activity after lexiscan given.   Statins    Other reaction(s): Other (See Comments) Myalgias and CK increase.  Tried both atorvastatin and rosuvastatin    Ace Inhibitors Cough      Medication List    STOP taking these medications   hydrALAZINE 25 MG tablet Commonly known as: APRESOLINE     TAKE these medications   (feeding supplement) PROSource Plus liquid Take 30 mLs by mouth 2 (two) times daily between meals.   feeding supplement (NEPRO CARB STEADY) Liqd Take 237 mLs by mouth 2 (two) times daily between meals.   acetaminophen 500 MG tablet Commonly known as: TYLENOL Take 500 mg by mouth daily.   aspirin 81 MG tablet Take 1 tablet (81 mg total) by mouth daily. What changed: when to take this   bisacodyl 10 MG suppository Commonly known as: DULCOLAX Place 10 mg rectally as needed for moderate constipation.   calcium acetate 667 MG capsule Commonly known as: PHOSLO Take 667 mg by mouth 3 (three) times daily.   carvedilol 3.125 MG tablet Commonly known as: COREG Take 1 tablet (3.125 mg total) by mouth 2 (two) times daily with a meal. Take after dialysis on dialysis days What changed:   how much to take  when to take this  additional instructions  Another medication with the same name was removed. Continue taking this medication, and follow the directions you see here.   Cholecalciferol 50 MCG (2000 UT) Tabs Take 1 tablet (2,000 Units total) by mouth daily.   clopidogrel 75 MG tablet Commonly known as: PLAVIX Take 75 mg by mouth daily.   cyanocobalamin 1000 MCG/ML injection Commonly known as: (VITAMIN B-12) Inject 1,000 mcg into the muscle every 30  (thirty) days. On the 26th   EPIPEN 2-PAK IJ Inject 0.3 mg into the muscle as needed (anaphyylaxis).   ezetimibe 10 MG tablet Commonly known as: ZETIA Take 10 mg by mouth every evening.   finasteride 5 MG tablet Commonly known as: PROSCAR Take 5 mg by mouth daily.   FLEET ENEMA RE Place 1 Dose rectally as needed (constipation).   folic acid 1 MG tablet Commonly known as: FOLVITE Take 2 tablets (2 mg total) by mouth daily. What changed: how much to take   GLUCAGON EMERGENCY IJ Inject 1 mg into the muscle as needed (blood sugar less than 70).   Glucose 77.4 % Gel Take 1 Dose by mouth as needed (blood glucose less than 70).   insulin lispro 100 UNIT/ML injection Commonly known as: HUMALOG Inject 2-10 Units into the skin See admin instructions. Bid per sliding scale Less than 150= 0  units, 151-200= 2 units, 201-250=4 units, 251-300= 6 units, 301-350= 8 units, 351-400= 10 units, if less than 70 or over 401 call md   memantine 10 MG tablet Commonly known as: NAMENDA Take 10 mg by mouth 2 (two) times daily.   multivitamin Tabs tablet Take 1 tablet by mouth daily.   niacin 100 MG tablet Take 200 mg by mouth every evening.   ondansetron 4 MG disintegrating tablet Commonly known as: ZOFRAN-ODT Take 4 mg by mouth every 4 (four) hours as needed for nausea or vomiting.   OneTouch Verio test strip Generic drug: glucose blood 1 each by Other route daily.   OVER THE COUNTER MEDICATION Take 1 Dose by mouth every evening. Yogurt for weight loss   OVER THE COUNTER MEDICATION Take 1 Dose by mouth See admin instructions. Nutritional treat with lunch Tuesday,Thursday, Saturday and sunday   polyethylene glycol 17 g packet Commonly known as: MIRALAX / GLYCOLAX Take 17 g by mouth 2 (two) times daily. What changed:   when to take this  reasons to take this   prednisoLONE acetate 1 % ophthalmic suspension Commonly known as: PRED FORTE Place 1 drop into the left eye at  bedtime. What changed: when to take this   Red Yeast Rice 600 MG Caps Take 1,200 mg by mouth at bedtime.   senna-docusate 8.6-50 MG tablet Commonly known as: Senokot-S Take 2 tablets by mouth daily. What changed:   how much to take  when to take this       Allergies  Allergen Reactions  . Bee Venom Swelling  . Lexiscan [Regadenoson] Other (See Comments)    Seizure like-activity after lexiscan given.  . Statins     Other reaction(s): Other (See Comments) Myalgias and CK increase.  Tried both atorvastatin and rosuvastatin   . Ace Inhibitors Cough      DG Chest 2 View  Result Date: 10/08/2020 CLINICAL DATA:  Syncope. EXAM: CHEST - 2 VIEW COMPARISON:  March 08, 2020. FINDINGS: The heart size and mediastinal contours are within normal limits. Both lungs are clear. No pneumothorax or pleural effusion is noted. Right-sided pacemaker is unchanged in position. The visualized skeletal structures are unremarkable. IMPRESSION: No active cardiopulmonary disease. Electronically Signed   By: Marijo Conception M.D.   On: 10/08/2020 18:32   CT Head Wo Contrast  Result Date: 10/08/2020 CLINICAL DATA:  Syncope. EXAM: CT HEAD WITHOUT CONTRAST CT CERVICAL SPINE WITHOUT CONTRAST TECHNIQUE: Multidetector CT imaging of the head and cervical spine was performed following the standard protocol without intravenous contrast. Multiplanar CT image reconstructions of the cervical spine were also generated. COMPARISON:  July 22, 2018. FINDINGS: CT HEAD FINDINGS Brain: Mild diffuse cortical atrophy is noted. Mild chronic ischemic white matter disease is noted. No mass effect or midline shift is noted. Ventricular size is within normal limits. There is no evidence of mass lesion, hemorrhage or acute infarction. Vascular: No hyperdense vessel or unexpected calcification. Skull: Normal. Negative for fracture or focal lesion. Sinuses/Orbits: No acute finding. Other: None. CT CERVICAL SPINE FINDINGS Alignment:  Normal. Skull base and vertebrae: No acute fracture. No primary bone lesion or focal pathologic process. Soft tissues and spinal canal: No prevertebral fluid or swelling. No visible canal hematoma. Disc levels: Severe degenerative disc disease is noted at C6-7 and C7-T1. Upper chest: Negative. Other: None. IMPRESSION: No acute intracranial abnormality seen. Severe multilevel degenerative disc disease is noted in the cervical spine. No acute abnormality is noted. Electronically Signed   By: Jeneen Rinks  Murlean Caller M.D.   On: 10/08/2020 18:09   CT Cervical Spine Wo Contrast  Result Date: 10/08/2020 CLINICAL DATA:  Syncope. EXAM: CT HEAD WITHOUT CONTRAST CT CERVICAL SPINE WITHOUT CONTRAST TECHNIQUE: Multidetector CT imaging of the head and cervical spine was performed following the standard protocol without intravenous contrast. Multiplanar CT image reconstructions of the cervical spine were also generated. COMPARISON:  July 22, 2018. FINDINGS: CT HEAD FINDINGS Brain: Mild diffuse cortical atrophy is noted. Mild chronic ischemic white matter disease is noted. No mass effect or midline shift is noted. Ventricular size is within normal limits. There is no evidence of mass lesion, hemorrhage or acute infarction. Vascular: No hyperdense vessel or unexpected calcification. Skull: Normal. Negative for fracture or focal lesion. Sinuses/Orbits: No acute finding. Other: None. CT CERVICAL SPINE FINDINGS Alignment: Normal. Skull base and vertebrae: No acute fracture. No primary bone lesion or focal pathologic process. Soft tissues and spinal canal: No prevertebral fluid or swelling. No visible canal hematoma. Disc levels: Severe degenerative disc disease is noted at C6-7 and C7-T1. Upper chest: Negative. Other: None. IMPRESSION: No acute intracranial abnormality seen. Severe multilevel degenerative disc disease is noted in the cervical spine. No acute abnormality is noted. Electronically Signed   By: Marijo Conception M.D.   On:  10/08/2020 18:09   ECHOCARDIOGRAM COMPLETE  Result Date: 10/09/2020    ECHOCARDIOGRAM REPORT   Patient Name:   Gregory Hood Date of Exam: 10/09/2020 Medical Rec #:  [id number]    Height:       70.0 in Accession #:    [id number]   Weight:       189.4 lb Date of Birth:  09/21/1950    BSA:          2.039 m Patient Age:    31 years     BP:           159/94 mmHg Patient Gender: M            HR:           79 bpm. Exam Location:  Inpatient Procedure: 2D Echo, Cardiac Doppler and Color Doppler Indications:    R55 Syncope  History:        Patient has prior history of Echocardiogram examinations, most                 recent 03/23/2018. Stroke, TIA and COPD; Risk                 Factors:Hypertension, Diabetes, Dyslipidemia and Sleep Apnea.                 CKD.  Sonographer:    Jonelle [name] Referring Phys: [place]  1. Left ventricular ejection fraction, by estimation, is 55 to 60%. The left ventricle has normal function. The left ventricle has no regional wall motion abnormalities. There is moderate left ventricular hypertrophy. Left ventricular diastolic parameters are consistent with Grade I diastolic dysfunction (impaired relaxation).  2. Right ventricular systolic function is normal. The right ventricular size is normal. Tricuspid regurgitation signal is inadequate for assessing PA pressure.  3. Left atrial size was moderately dilated.  4. The mitral valve is abnormal. Trivial mitral valve regurgitation. No evidence of mitral stenosis. Moderate mitral annular calcification.  5. The aortic valve is tricuspid. Aortic valve regurgitation is not visualized. Mild aortic valve sclerosis is present, with no evidence of aortic valve stenosis.  6. Aortic dilatation noted. There is mild dilatation of the aortic  root and of the ascending aorta, measuring 43 mm.  7. The inferior vena cava is normal in size with greater than 50% respiratory variability, suggesting right atrial pressure of 3 mmHg. FINDINGS   Left Ventricle: Left ventricular ejection fraction, by estimation, is 55 to 60%. The left ventricle has normal function. The left ventricle has no regional wall motion abnormalities. The left ventricular internal cavity size was normal in size. There is  moderate left ventricular hypertrophy. Left ventricular diastolic parameters are consistent with Grade I diastolic dysfunction (impaired relaxation). Right Ventricle: The right ventricular size is normal. No increase in right ventricular wall thickness. Right ventricular systolic function is normal. Tricuspid regurgitation signal is inadequate for assessing PA pressure. Left Atrium: Left atrial size was moderately dilated. Right Atrium: Right atrial size was normal in size. Pericardium: There is no evidence of pericardial effusion. Mitral Valve: The mitral valve is abnormal. There is moderate calcification of the mitral valve leaflet(s). Moderate mitral annular calcification. Trivial mitral valve regurgitation. No evidence of mitral valve stenosis. Tricuspid Valve: The tricuspid valve is normal in structure. Tricuspid valve regurgitation is not demonstrated. Aortic Valve: The aortic valve is tricuspid. Aortic valve regurgitation is not visualized. Mild aortic valve sclerosis is present, with no evidence of aortic valve stenosis. Pulmonic Valve: The pulmonic valve was normal in structure. Pulmonic valve regurgitation is not visualized. Aorta: Aortic dilatation noted. There is mild dilatation of the aortic root and of the ascending aorta, measuring 43 mm. Venous: The inferior vena cava is normal in size with greater than 50% respiratory variability, suggesting right atrial pressure of 3 mmHg. IAS/Shunts: No atrial level shunt detected by color flow Doppler. Additional Comments: A device lead is visualized in the right ventricle.  LEFT VENTRICLE PLAX 2D LVIDd:         3.80 cm  Diastology LVIDs:         2.70 cm  LV e' medial:    3.57 cm/s LV PW:         1.40 cm  LV E/e'  medial:  17.4 LV IVS:        1.70 cm  LV e' lateral:   5.35 cm/s LVOT diam:     2.20 cm  LV E/e' lateral: 11.6 LV SV:         75 LV SV Index:   37 LVOT Area:     3.80 cm  RIGHT VENTRICLE            IVC RV Basal diam:  2.60 cm    IVC diam: 1.40 cm RV S prime:     9.00 cm/s TAPSE (M-mode): 1.8 cm LEFT ATRIUM              Index       RIGHT ATRIUM           Index LA diam:        4.80 cm  2.35 cm/m  RA Area:     13.60 cm LA Vol (A2C):   122.0 ml 59.82 ml/m RA Volume:   31.30 ml  15.35 ml/m LA Vol (A4C):   53.3 ml  26.13 ml/m LA Biplane Vol: 83.4 ml  40.89 ml/m  AORTIC VALVE LVOT Vmax:   86.50 cm/s LVOT Vmean:  53.600 cm/s LVOT VTI:    0.197 m  AORTA Ao Root diam: 4.30 cm Ao Asc diam:  4.30 cm MITRAL VALVE MV Area (PHT): 2.45 cm    SHUNTS MV Decel Time: 310 msec    Systemic VTI:  0.20 m MV E velocity: 62.10 cm/s  Systemic Diam: 2.20 cm MV A velocity: 90.70 cm/s MV E/A ratio:  0.68 Loralie Champagne MD Electronically signed by Loralie Champagne MD Signature Date/Time: 10/09/2020/6:23:29 PM    Final      The results of significant diagnostics from this hospitalization (including imaging, microbiology, ancillary and laboratory) are listed below for reference.     Microbiology: Recent Results (from the past 240 hour(s))  SARS CORONAVIRUS 2 (TAT 6-24 HRS) Nasopharyngeal Nasopharyngeal Swab     Status: None   Collection Time: 10/08/20  9:28 PM   Specimen: Nasopharyngeal Swab  Result Value Ref Range Status   SARS Coronavirus 2 NEGATIVE NEGATIVE Final    Comment: (NOTE) SARS-CoV-2 target nucleic acids are NOT DETECTED.  The SARS-CoV-2 RNA is generally detectable in upper and lower respiratory specimens during the acute phase of infection. Negative results do not preclude SARS-CoV-2 infection, do not rule out co-infections with other pathogens, and should not be used as the sole basis for treatment or other patient management decisions. Negative results must be combined with clinical observations, patient  history, and epidemiological information. The expected result is Negative.  Fact Sheet for Patients: SugarRoll.be  Fact Sheet for Healthcare Providers: https://www.woods-mathews.com/  This test is not yet approved or cleared by the [place] FDA and  has been authorized for detection and/or diagnosis of SARS-CoV-2 by FDA under an Emergency Use Authorization (EUA). This EUA will remain  in effect (meaning this test can be used) for the duration of the COVID-19 declaration under Se ction 564(b)(1) of the Act, 21 U.S.C. section 360bbb-3(b)(1), unless the authorization is terminated or revoked sooner.  Performed at [facility], [place] [address], [place], [place] [address]   MRSA PCR Screening     Status: None   Collection Time: 10/09/20 12:00 PM   Specimen: Nasal Mucosa; Nasopharyngeal  Result Value Ref Range Status   MRSA by PCR NEGATIVE NEGATIVE Final    Comment:        The GeneXpert MRSA Assay (FDA approved for NASAL specimens only), is one component of a comprehensive MRSA colonization surveillance program. It is not intended to diagnose MRSA infection nor to guide or monitor treatment for MRSA infections. Performed at [facility], [place] [address] [place], [place] [address]      Labs: BNP (last 3 results) No results for input(s): BNP in the last 8760 hours. Basic Metabolic Panel: Recent Labs  Lab 10/08/20 1723 10/08/20 1914 10/09/20 0934  NA 136  --  137  K 3.5  --  4.1  CL 93*  --  96*  CO2 36*  --  32  GLUCOSE 241*  --  109*  BUN 17  --  25*  CREATININE 4.96*  --  6.01*  CALCIUM 9.1  --  9.1  MG  --  2.3  --    Liver Function Tests: Recent Labs  Lab 10/08/20 1723  AST 31  ALT 24  ALKPHOS 82  BILITOT 0.7  PROT 7.3  ALBUMIN 3.6   Recent Labs  Lab 10/08/20 1723  LIPASE 101*   No results for input(s): AMMONIA in the last 168 hours. CBC: Recent Labs  Lab 10/08/20 1723  WBC 4.7   NEUTROABS 3.3  HGB 10.5*  HCT 32.8*  MCV 94.5  PLT 173   Cardiac Enzymes: No results for input(s): CKTOTAL, CKMB, CKMBINDEX, TROPONINI in the last 168 hours. BNP: Invalid input(s): POCBNP CBG: Recent Labs  Lab 10/10/20  0533 10/10/20 1135 10/10/20 1750 10/10/20 2122 10/11/20 0538  GLUCAP 89 119* 94 165* 198*   D-Dimer No results for input(s): DDIMER in the last 72 hours. Hgb A1c Recent Labs    10/08/20 2301  HGBA1C 6.1*   Lipid Profile No results for input(s): CHOL, HDL, LDLCALC, TRIG, CHOLHDL, LDLDIRECT in the last 72 hours. Thyroid function studies No results for input(s): TSH, T4TOTAL, T3FREE, THYROIDAB in the last 72 hours.  Invalid input(s): FREET3 Anemia work up No results for input(s): VITAMINB12, FOLATE, FERRITIN, TIBC, IRON, RETICCTPCT in the last 72 hours. Urinalysis    Component Value Date/Time   COLORURINE YELLOW 09/24/2014 0216   APPEARANCEUR CLOUDY (A) 09/24/2014 0216   LABSPEC 1.018 09/24/2014 0216   PHURINE 5.0 09/24/2014 0216   GLUCOSEU NEGATIVE 09/24/2014 0216   HGBUR NEGATIVE 09/24/2014 0216   BILIRUBINUR NEGATIVE 09/24/2014 0216   KETONESUR NEGATIVE 09/24/2014 0216   PROTEINUR >300 (A) 09/24/2014 0216   UROBILINOGEN 0.2 09/24/2014 0216   NITRITE NEGATIVE 09/24/2014 0216   LEUKOCYTESUR NEGATIVE 09/24/2014 0216   Sepsis Labs Invalid input(s): PROCALCITONIN,  WBC,  LACTICIDVEN Microbiology Recent Results (from the past 240 hour(s))  SARS CORONAVIRUS 2 (TAT 6-24 HRS) Nasopharyngeal Nasopharyngeal Swab     Status: None   Collection Time: 10/08/20  9:28 PM   Specimen: Nasopharyngeal Swab  Result Value Ref Range Status   SARS Coronavirus 2 NEGATIVE NEGATIVE Final    Comment: (NOTE) SARS-CoV-2 target nucleic acids are NOT DETECTED.  The SARS-CoV-2 RNA is generally detectable in upper and lower respiratory specimens during the acute phase of infection. Negative results do not preclude SARS-CoV-2 infection, do not rule out co-infections  with other pathogens, and should not be used as the sole basis for treatment or other patient management decisions. Negative results must be combined with clinical observations, patient history, and epidemiological information. The expected result is Negative.  Fact Sheet for Patients: SugarRoll.be  Fact Sheet for Healthcare Providers: https://www.woods-mathews.com/  This test is not yet approved or cleared by the [place] FDA and  has been authorized for detection and/or diagnosis of SARS-CoV-2 by FDA under an Emergency Use Authorization (EUA). This EUA will remain  in effect (meaning this test can be used) for the duration of the COVID-19 declaration under Se ction 564(b)(1) of the Act, 21 U.S.C. section 360bbb-3(b)(1), unless the authorization is terminated or revoked sooner.  Performed at [facility], [place] [address] [place], [place] [address]   MRSA PCR Screening     Status: None   Collection Time: 10/09/20 12:00 PM   Specimen: Nasal Mucosa; Nasopharyngeal  Result Value Ref Range Status   MRSA by PCR NEGATIVE NEGATIVE Final    Comment:        The GeneXpert MRSA Assay (FDA approved for NASAL specimens only), is one component of a comprehensive MRSA colonization surveillance program. It is not intended to diagnose MRSA infection nor to guide or monitor treatment for MRSA infections. Performed at [facility], [place] [address] [place], [place] [address]      Time coordinating discharge in minutes: 65  SIGNED:   Debbe Odea, MD  [facility] 10/11/2020, 10:01 AM

## 2020-10-11 NOTE — Social Work (Signed)
CSW was alerted that pt was medically ready for discharge to back to [facility]. CSW confirmed the plan with pt's RN because he had informed her that he was being discharged today back to the facility. A DC summary is in for patient.  CSW called [facility] care line and was asked to fax, signed fl2, H&P, MD notes and DC summary to [phone]. Once facility receives documents, CSW will receive a return call.

## 2020-10-11 NOTE — Progress Notes (Signed)
Progress Note  Patient Name: Gregory Hood Date of Encounter: 10/11/2020  Primary Cardiologist: [facility]  Subjective   No events overnight.  No issues with HD.  Inpatient Medications    Scheduled Meds: . (feeding supplement) PROSource Plus  30 mL Oral BID BM  . acetaminophen  500 mg Oral Daily  . aspirin  81 mg Oral QHS  . calcium acetate  667 mg Oral TID AC  . carvedilol  3.125 mg Oral BID WC  . Chlorhexidine Gluconate Cloth  6 each Topical Q0600  . cholecalciferol  2,000 Units Oral Daily  . clopidogrel  75 mg Oral Daily  . [START ON 10/21/2020] cyanocobalamin  1,000 mcg Intramuscular Q30 days  . ezetimibe  10 mg Oral QPM  . feeding supplement (NEPRO CARB STEADY)  237 mL Oral BID BM  . finasteride  5 mg Oral Daily  . folic acid  1 mg Oral Daily  . heparin  5,000 Units Subcutaneous Q8H  . insulin aspart  0-9 Units Subcutaneous TID WC  . memantine  10 mg Oral BID  . multivitamin  1 tablet Oral QHS  . niacin  200 mg Oral QPM  . prednisoLONE acetate  1 drop Left Eye BID  . senna-docusate  1 tablet Oral BID  . sodium chloride flush  3 mL Intravenous Q12H   Continuous Infusions:  PRN Meds: acetaminophen **OR** acetaminophen, bisacodyl, dextrose, ondansetron **OR** ondansetron (ZOFRAN) IV, polyethylene glycol   Vital Signs    Vitals:   10/10/20 1929 10/11/20 0030 10/11/20 0332 10/11/20 0711  BP: 130/66 125/72 125/69 136/72  Pulse: 73 79 71 72  Resp: '19 18 18 20  '$ Temp: 98.8 F (37.1 C) 98.6 F (37 C) 98.2 F (36.8 C) 98 F (36.7 C)  TempSrc: Oral Oral Oral Oral  SpO2: 100% 99% 100% 100%  Weight:   85.7 kg   Height:        Intake/Output Summary (Last 24 hours) at 10/11/2020 0953 Last data filed at 10/10/2020 2300 Gross per 24 hour  Intake 483 ml  Output 700 ml  Net -217 ml   Filed Weights   10/10/20 1240 10/10/20 1620 10/11/20 0332  Weight: 86.9 kg 86.4 kg 85.7 kg    Telemetry    SR with occasional pacing - Personally Reviewed  ECG    No new  - Personally Reviewed  Physical Exam   GEN: No acute distress.   Neck: No JVD Cardiac: RRR, no murmurs, rubs, or gallops.  L arm Fistula with good bruit/thrill Respiratory: Clear to auscultation bilaterally. GI: Soft, nontender, non-distended  MS: No edema; No deformity. Neuro:  Nonfocal  (blind left eye) Psych: Normal affect   Labs    Chemistry Recent Labs  Lab 10/08/20 1723 10/09/20 0934  NA 136 137  K 3.5 4.1  CL 93* 96*  CO2 36* 32  GLUCOSE 241* 109*  BUN 17 25*  CREATININE 4.96* 6.01*  CALCIUM 9.1 9.1  PROT 7.3  --   ALBUMIN 3.6  --   AST 31  --   ALT 24  --   ALKPHOS 82  --   BILITOT 0.7  --   GFRNONAA 12* 10*  ANIONGAP 7 9     Hematology Recent Labs  Lab 10/08/20 1723  WBC 4.7  RBC 3.47*  HGB 10.5*  HCT 32.8*  MCV 94.5  MCH 30.3  MCHC 32.0  RDW 12.8  PLT 173    Cardiac EnzymesNo results for input(s): TROPONINI in the last  168 hours. No results for input(s): TROPIPOC in the last 168 hours.   BNPNo results for input(s): BNP, PROBNP in the last 168 hours.   DDimer No results for input(s): DDIMER in the last 168 hours.   Radiology    ECHOCARDIOGRAM COMPLETE  Result Date: 10/09/2020    ECHOCARDIOGRAM REPORT   Patient Name:   Gregory Hood Date of Exam: 10/09/2020 Medical Rec #:  [id number]    Height:       70.0 in Accession #:    [id number]   Weight:       189.4 lb Date of Birth:  1951-02-16    BSA:          2.039 m Patient Age:    70 years     BP:           159/94 mmHg Patient Gender: M            HR:           79 bpm. Exam Location:  Inpatient Procedure: 2D Echo, Cardiac Doppler and Color Doppler Indications:    R55 Syncope  History:        Patient has prior history of Echocardiogram examinations, most                 recent 03/23/2018. Stroke, TIA and COPD; Risk                 Factors:Hypertension, Diabetes, Dyslipidemia and Sleep Apnea.                 CKD.  Sonographer:    Jonelle [name] Referring Phys: [place]  1. Left  ventricular ejection fraction, by estimation, is 55 to 60%. The left ventricle has normal function. The left ventricle has no regional wall motion abnormalities. There is moderate left ventricular hypertrophy. Left ventricular diastolic parameters are consistent with Grade I diastolic dysfunction (impaired relaxation).  2. Right ventricular systolic function is normal. The right ventricular size is normal. Tricuspid regurgitation signal is inadequate for assessing PA pressure.  3. Left atrial size was moderately dilated.  4. The mitral valve is abnormal. Trivial mitral valve regurgitation. No evidence of mitral stenosis. Moderate mitral annular calcification.  5. The aortic valve is tricuspid. Aortic valve regurgitation is not visualized. Mild aortic valve sclerosis is present, with no evidence of aortic valve stenosis.  6. Aortic dilatation noted. There is mild dilatation of the aortic root and of the ascending aorta, measuring 43 mm.  7. The inferior vena cava is normal in size with greater than 50% respiratory variability, suggesting right atrial pressure of 3 mmHg. FINDINGS  Left Ventricle: Left ventricular ejection fraction, by estimation, is 55 to 60%. The left ventricle has normal function. The left ventricle has no regional wall motion abnormalities. The left ventricular internal cavity size was normal in size. There is  moderate left ventricular hypertrophy. Left ventricular diastolic parameters are consistent with Grade I diastolic dysfunction (impaired relaxation). Right Ventricle: The right ventricular size is normal. No increase in right ventricular wall thickness. Right ventricular systolic function is normal. Tricuspid regurgitation signal is inadequate for assessing PA pressure. Left Atrium: Left atrial size was moderately dilated. Right Atrium: Right atrial size was normal in size. Pericardium: There is no evidence of pericardial effusion. Mitral Valve: The mitral valve is abnormal. There is  moderate calcification of the mitral valve leaflet(s). Moderate mitral annular calcification. Trivial mitral valve regurgitation. No evidence of mitral valve stenosis. Tricuspid Valve: The  tricuspid valve is normal in structure. Tricuspid valve regurgitation is not demonstrated. Aortic Valve: The aortic valve is tricuspid. Aortic valve regurgitation is not visualized. Mild aortic valve sclerosis is present, with no evidence of aortic valve stenosis. Pulmonic Valve: The pulmonic valve was normal in structure. Pulmonic valve regurgitation is not visualized. Aorta: Aortic dilatation noted. There is mild dilatation of the aortic root and of the ascending aorta, measuring 43 mm. Venous: The inferior vena cava is normal in size with greater than 50% respiratory variability, suggesting right atrial pressure of 3 mmHg. IAS/Shunts: No atrial level shunt detected by color flow Doppler. Additional Comments: A device lead is visualized in the right ventricle.  LEFT VENTRICLE PLAX 2D LVIDd:         3.80 cm  Diastology LVIDs:         2.70 cm  LV e' medial:    3.57 cm/s LV PW:         1.40 cm  LV E/e' medial:  17.4 LV IVS:        1.70 cm  LV e' lateral:   5.35 cm/s LVOT diam:     2.20 cm  LV E/e' lateral: 11.6 LV SV:         75 LV SV Index:   37 LVOT Area:     3.80 cm  RIGHT VENTRICLE            IVC RV Basal diam:  2.60 cm    IVC diam: 1.40 cm RV S prime:     9.00 cm/s TAPSE (M-mode): 1.8 cm LEFT ATRIUM              Index       RIGHT ATRIUM           Index LA diam:        4.80 cm  2.35 cm/m  RA Area:     13.60 cm LA Vol (A2C):   122.0 ml 59.82 ml/m RA Volume:   31.30 ml  15.35 ml/m LA Vol (A4C):   53.3 ml  26.13 ml/m LA Biplane Vol: 83.4 ml  40.89 ml/m  AORTIC VALVE LVOT Vmax:   86.50 cm/s LVOT Vmean:  53.600 cm/s LVOT VTI:    0.197 m  AORTA Ao Root diam: 4.30 cm Ao Asc diam:  4.30 cm MITRAL VALVE MV Area (PHT): 2.45 cm    SHUNTS MV Decel Time: 310 msec    Systemic VTI:  0.20 m MV E velocity: 62.10 cm/s  Systemic Diam:  2.20 cm MV A velocity: 90.70 cm/s MV E/A ratio:  0.68 Loralie Champagne MD Electronically signed by Loralie Champagne MD Signature Date/Time: 10/09/2020/6:23:29 PM    Final     Cardiac Studies   Transthoracic Echocardiogram: Date: 10/10/20 Results: Mild AA dilation 43 mm 1. Left ventricular ejection fraction, by estimation, is 55 to 60%. The  left ventricle has normal function. The left ventricle has no regional  wall motion abnormalities. There is moderate left ventricular hypertrophy.  Left ventricular diastolic  parameters are consistent with Grade I diastolic dysfunction (impaired  relaxation).  2. Right ventricular systolic function is normal. The right ventricular  size is normal. Tricuspid regurgitation signal is inadequate for assessing  PA pressure.  3. Left atrial size was moderately dilated.  4. The mitral valve is abnormal. Trivial mitral valve regurgitation. No  evidence of mitral stenosis. Moderate mitral annular calcification.  5. The aortic valve is tricuspid. Aortic valve regurgitation is not  visualized. Mild aortic valve sclerosis  is present, with no evidence of  aortic valve stenosis.  6. Aortic dilatation noted. There is mild dilatation of the aortic root  and of the ascending aorta, measuring 43 mm.  7. The inferior vena cava is normal in size with greater than 50%  respiratory variability, suggesting right atrial pressure of 3 mmHg.    Patient Profile     70 y.o. male HFpEF, HTN, HLD, ESRD (LUE with AV Fistula) PAD, history of Pr L thalamic CVA, dementia NOS with syncope in the setting of vomiting  Assessment & Plan    Syncope and collapse- possible vasovagal syncope SSS s/p MDT DC PPM HFpEF HTN with DM Dementia on Memantine Hx of CVA with blindness PAD NOS Mild Thoracic Aortic aneurysm, AAHI 2.41 cm/m - no evidence of arrhythmogenic cause - with evidence of structural heart damage - Novel troponin < 100; doesn't appear consistent with ACS - most  consistent with vasovagal syncope presently - home DAPT is reasonable but unclear long term indication- managed at [facility] - will continue low dose Coreg 3.125 mg PO BID  - continue home Zetia - this stay should not necessarily pre-clude him from his upcoming GI surgery in at [facility] ([place]) on 12/05/20 - discussed with patient and primary MD  [facility] will sign off.   Medication Recommendations:  Coreg 3.125 Other recommendations (labs, testing, etc):  NA; no programming changes made to device Follow up as an outpatient:  [facility]      For questions or updates, please contact [place] Please consult [email or web address] for contact info under Cardiology/STEMI.      Signed, Werner Lean, MD  10/11/2020, 9:53 AM

## 2020-10-11 NOTE — TOC Transition Note (Signed)
Transition of Care [facility]) - CM/SW Discharge Note   Patient Details  Name: Gregory Hood MRN: [id number] Date of Birth: 10/17/1950  [facility] CM/SW Contact:  Bary Castilla, LCSW Phone Number: [phone] 10/11/2020, 11:58 AM   Clinical Narrative:     CSW received a call back from IRIS with [facility] and she informed me that they can accept pt back today  Patient will DC [id number] [place] date:10/11/2020? Family notified:?[name] by: Corey Harold   Per MD patient ready for DC to [facility] RN, patient, patient's family, and facility notified of DC. Discharge Summary sent to facility. RN given number for report [phone] . DC packet on chart. Ambulance transport requested for patient.   CSW signing off.   [place], [place] [phone]         Patient Goals and CMS Choice        Discharge Placement                       Discharge Plan and Services                                     Social Determinants of Health (SDOH) Interventions     Readmission Risk Interventions No flowsheet data found.

## 2020-12-22 ENCOUNTER — Emergency Department (HOSPITAL_COMMUNITY): Payer: Medicare Other

## 2020-12-22 ENCOUNTER — Observation Stay (HOSPITAL_COMMUNITY): Payer: Medicare Other

## 2020-12-22 ENCOUNTER — Inpatient Hospital Stay (HOSPITAL_COMMUNITY)
Admission: EM | Admit: 2020-12-22 | Discharge: 2020-12-24 | DRG: 070 | Disposition: A | Discharge status: Skilled Nursing Facility | Payer: Medicare Other | Attending: Internal Medicine | Admitting: Internal Medicine

## 2020-12-22 ENCOUNTER — Encounter (HOSPITAL_COMMUNITY): Payer: Self-pay | Admitting: Emergency Medicine

## 2020-12-22 ENCOUNTER — Other Ambulatory Visit (HOSPITAL_COMMUNITY): Payer: Medicare Other

## 2020-12-22 DIAGNOSIS — J449 Chronic obstructive pulmonary disease, unspecified: Secondary | ICD-10-CM | POA: Diagnosis present

## 2020-12-22 DIAGNOSIS — E785 Hyperlipidemia, unspecified: Secondary | ICD-10-CM | POA: Diagnosis present

## 2020-12-22 DIAGNOSIS — Z992 Dependence on renal dialysis: Secondary | ICD-10-CM

## 2020-12-22 DIAGNOSIS — R4182 Altered mental status, unspecified: Secondary | ICD-10-CM

## 2020-12-22 DIAGNOSIS — E1122 Type 2 diabetes mellitus with diabetic chronic kidney disease: Secondary | ICD-10-CM | POA: Diagnosis not present

## 2020-12-22 DIAGNOSIS — Z833 Family history of diabetes mellitus: Secondary | ICD-10-CM

## 2020-12-22 DIAGNOSIS — Z841 Family history of disorders of kidney and ureter: Secondary | ICD-10-CM

## 2020-12-22 DIAGNOSIS — W07XXXA Fall from chair, initial encounter: Secondary | ICD-10-CM | POA: Diagnosis present

## 2020-12-22 DIAGNOSIS — R531 Weakness: Secondary | ICD-10-CM

## 2020-12-22 DIAGNOSIS — N186 End stage renal disease: Secondary | ICD-10-CM | POA: Diagnosis present

## 2020-12-22 DIAGNOSIS — Z950 Presence of cardiac pacemaker: Secondary | ICD-10-CM

## 2020-12-22 DIAGNOSIS — G4733 Obstructive sleep apnea (adult) (pediatric): Secondary | ICD-10-CM | POA: Diagnosis present

## 2020-12-22 DIAGNOSIS — R0602 Shortness of breath: Secondary | ICD-10-CM

## 2020-12-22 DIAGNOSIS — G9341 Metabolic encephalopathy: Principal | ICD-10-CM | POA: Diagnosis present

## 2020-12-22 DIAGNOSIS — E11319 Type 2 diabetes mellitus with unspecified diabetic retinopathy without macular edema: Secondary | ICD-10-CM | POA: Diagnosis not present

## 2020-12-22 DIAGNOSIS — Y92129 Unspecified place in nursing home as the place of occurrence of the external cause: Secondary | ICD-10-CM

## 2020-12-22 DIAGNOSIS — D631 Anemia in chronic kidney disease: Secondary | ICD-10-CM | POA: Diagnosis present

## 2020-12-22 DIAGNOSIS — D649 Anemia, unspecified: Secondary | ICD-10-CM | POA: Diagnosis present

## 2020-12-22 DIAGNOSIS — Z8673 Personal history of transient ischemic attack (TIA), and cerebral infarction without residual deficits: Secondary | ICD-10-CM

## 2020-12-22 DIAGNOSIS — I495 Sick sinus syndrome: Secondary | ICD-10-CM | POA: Diagnosis present

## 2020-12-22 DIAGNOSIS — Z79899 Other long term (current) drug therapy: Secondary | ICD-10-CM

## 2020-12-22 DIAGNOSIS — N2581 Secondary hyperparathyroidism of renal origin: Secondary | ICD-10-CM | POA: Diagnosis present

## 2020-12-22 DIAGNOSIS — I5032 Chronic diastolic (congestive) heart failure: Secondary | ICD-10-CM | POA: Diagnosis not present

## 2020-12-22 DIAGNOSIS — G309 Alzheimer's disease, unspecified: Secondary | ICD-10-CM | POA: Diagnosis present

## 2020-12-22 DIAGNOSIS — S0003XA Contusion of scalp, initial encounter: Secondary | ICD-10-CM | POA: Diagnosis present

## 2020-12-22 DIAGNOSIS — Z7902 Long term (current) use of antithrombotics/antiplatelets: Secondary | ICD-10-CM

## 2020-12-22 DIAGNOSIS — E1129 Type 2 diabetes mellitus with other diabetic kidney complication: Secondary | ICD-10-CM | POA: Diagnosis present

## 2020-12-22 DIAGNOSIS — F05 Delirium due to known physiological condition: Secondary | ICD-10-CM | POA: Diagnosis present

## 2020-12-22 DIAGNOSIS — Z20822 Contact with and (suspected) exposure to covid-19: Secondary | ICD-10-CM | POA: Diagnosis present

## 2020-12-22 DIAGNOSIS — K5900 Constipation, unspecified: Secondary | ICD-10-CM | POA: Diagnosis present

## 2020-12-22 DIAGNOSIS — Z7982 Long term (current) use of aspirin: Secondary | ICD-10-CM

## 2020-12-22 DIAGNOSIS — I132 Hypertensive heart and chronic kidney disease with heart failure and with stage 5 chronic kidney disease, or end stage renal disease: Secondary | ICD-10-CM | POA: Diagnosis present

## 2020-12-22 DIAGNOSIS — F0280 Dementia in other diseases classified elsewhere without behavioral disturbance: Secondary | ICD-10-CM | POA: Diagnosis present

## 2020-12-22 DIAGNOSIS — G9340 Encephalopathy, unspecified: Secondary | ICD-10-CM | POA: Diagnosis not present

## 2020-12-22 DIAGNOSIS — Z8249 Family history of ischemic heart disease and other diseases of the circulatory system: Secondary | ICD-10-CM

## 2020-12-22 DIAGNOSIS — Z87891 Personal history of nicotine dependence: Secondary | ICD-10-CM

## 2020-12-22 DIAGNOSIS — Z794 Long term (current) use of insulin: Secondary | ICD-10-CM

## 2020-12-22 DIAGNOSIS — I10 Essential (primary) hypertension: Secondary | ICD-10-CM | POA: Diagnosis present

## 2020-12-22 DIAGNOSIS — E669 Obesity, unspecified: Secondary | ICD-10-CM | POA: Diagnosis present

## 2020-12-22 DIAGNOSIS — Z888 Allergy status to other drugs, medicaments and biological substances status: Secondary | ICD-10-CM

## 2020-12-22 DIAGNOSIS — Z91030 Bee allergy status: Secondary | ICD-10-CM

## 2020-12-22 LAB — URINALYSIS, COMPLETE (UACMP) WITH MICROSCOPIC
Bacteria, UA: NONE SEEN
Bilirubin Urine: NEGATIVE
Glucose, UA: NEGATIVE mg/dL
Ketones, ur: NEGATIVE mg/dL
Leukocytes,Ua: NEGATIVE
Nitrite: NEGATIVE
Protein, ur: 100 mg/dL — AB
Specific Gravity, Urine: 1.014 (ref 1.005–1.030)
pH: 7 (ref 5.0–8.0)

## 2020-12-22 LAB — COMPREHENSIVE METABOLIC PANEL
ALT: 11 U/L (ref 0–44)
AST: 30 U/L (ref 15–41)
Albumin: 3.5 g/dL (ref 3.5–5.0)
Alkaline Phosphatase: 59 U/L (ref 38–126)
Anion gap: 15 (ref 5–15)
BUN: 69 mg/dL — ABNORMAL HIGH (ref 8–23)
CO2: 26 mmol/L (ref 22–32)
Calcium: 9.1 mg/dL (ref 8.9–10.3)
Chloride: 95 mmol/L — ABNORMAL LOW (ref 98–111)
Creatinine, Ser: 12.37 mg/dL — ABNORMAL HIGH (ref 0.61–1.24)
GFR, Estimated: 4 mL/min — ABNORMAL LOW (ref >60)
Glucose, Bld: 107 mg/dL — ABNORMAL HIGH (ref 70–99)
Potassium: 4 mmol/L (ref 3.5–5.1)
Sodium: 136 mmol/L (ref 135–145)
Total Bilirubin: 0.7 mg/dL (ref 0.3–1.2)
Total Protein: 7.3 g/dL (ref 6.5–8.1)

## 2020-12-22 LAB — CBC
HCT: 31.3 % — ABNORMAL LOW (ref 39.0–52.0)
Hemoglobin: 9.9 g/dL — ABNORMAL LOW (ref 13.0–17.0)
MCH: 30.5 pg (ref 26.0–34.0)
MCHC: 31.6 g/dL (ref 30.0–36.0)
MCV: 96.3 fL (ref 80.0–100.0)
Platelets: 260 10*3/uL (ref 150–400)
RBC: 3.25 MIL/uL — ABNORMAL LOW (ref 4.22–5.81)
RDW: 12.8 % (ref 11.5–15.5)
WBC: 5.1 10*3/uL (ref 4.0–10.5)
nRBC: 0 % (ref 0.0–0.2)

## 2020-12-22 LAB — RESP PANEL BY RT-PCR (FLU A&B, COVID) ARPGX2
Influenza A by PCR: NEGATIVE
Influenza B by PCR: NEGATIVE
SARS Coronavirus 2 by RT PCR: NEGATIVE

## 2020-12-22 LAB — RETICULOCYTES
Immature Retic Fract: 10.4 % (ref 2.3–15.9)
RBC.: 3.1 MIL/uL — ABNORMAL LOW (ref 4.22–5.81)
Retic Count, Absolute: 41.2 10*3/uL (ref 19.0–186.0)
Retic Ct Pct: 1.3 % (ref 0.4–3.1)

## 2020-12-22 LAB — CK: Total CK: 227 U/L (ref 49–397)

## 2020-12-22 LAB — AMMONIA: Ammonia: 22 umol/L (ref 9–35)

## 2020-12-22 LAB — GLUCOSE, CAPILLARY: Glucose-Capillary: 129 mg/dL — ABNORMAL HIGH (ref 70–99)

## 2020-12-22 MED ORDER — EZETIMIBE 10 MG PO TABS
10.0000 mg | ORAL_TABLET | Freq: Every evening | ORAL | Status: DC
  Administered 2020-12-22: 10 mg via ORAL
  Filled 2020-12-22 (×2): qty 1

## 2020-12-22 MED ORDER — SODIUM CHLORIDE 0.9% FLUSH
3.0000 mL | Freq: Two times a day (BID) | INTRAVENOUS | Status: DC
  Administered 2020-12-22 – 2020-12-24 (×4): 3 mL via INTRAVENOUS

## 2020-12-22 MED ORDER — CARVEDILOL 3.125 MG PO TABS
3.1250 mg | ORAL_TABLET | Freq: Two times a day (BID) | ORAL | Status: DC
  Administered 2020-12-23 – 2020-12-24 (×2): 3.125 mg via ORAL
  Filled 2020-12-22 (×3): qty 1

## 2020-12-22 MED ORDER — TRAMADOL HCL 50 MG PO TABS: 25.0000 mg | ORAL_TABLET | Freq: Two times a day (BID) | ORAL | Status: DC | PRN

## 2020-12-22 MED ORDER — FINASTERIDE 5 MG PO TABS
5.0000 mg | ORAL_TABLET | Freq: Every day | ORAL | Status: DC
  Administered 2020-12-23 – 2020-12-24 (×3): 5 mg via ORAL
  Filled 2020-12-22 (×3): qty 1

## 2020-12-22 MED ORDER — ACETAMINOPHEN 650 MG RE SUPP: 650.0000 mg | Freq: Four times a day (QID) | RECTAL | Status: DC | PRN

## 2020-12-22 MED ORDER — BISACODYL 10 MG RE SUPP: 10.0000 mg | RECTAL | Status: DC | PRN

## 2020-12-22 MED ORDER — HYDROCODONE-ACETAMINOPHEN 5-325 MG PO TABS: 1.0000 | ORAL_TABLET | ORAL | Status: DC | PRN

## 2020-12-22 MED ORDER — SODIUM CHLORIDE 0.9 % IV SOLN: 250.0000 mL | INTRAVENOUS | Status: DC | PRN

## 2020-12-22 MED ORDER — MEMANTINE HCL 10 MG PO TABS
10.0000 mg | ORAL_TABLET | Freq: Two times a day (BID) | ORAL | Status: DC
  Administered 2020-12-22 – 2020-12-24 (×4): 10 mg via ORAL
  Filled 2020-12-22 (×4): qty 1

## 2020-12-22 MED ORDER — ACETAMINOPHEN 325 MG PO TABS
650.0000 mg | ORAL_TABLET | Freq: Four times a day (QID) | ORAL | Status: DC | PRN
Start: 2020-12-22 — End: 2020-12-24

## 2020-12-22 MED ORDER — INSULIN ASPART 100 UNIT/ML IJ SOLN
0.0000 [IU] | INTRAMUSCULAR | Status: DC
  Administered 2020-12-24: 1 [IU] via SUBCUTANEOUS

## 2020-12-22 MED ORDER — SODIUM CHLORIDE 0.9% FLUSH: 3.0000 mL | INTRAVENOUS | Status: DC | PRN

## 2020-12-22 MED ORDER — BISACODYL 10 MG RE SUPP: 10.0000 mg | Freq: Every day | RECTAL | Status: DC | PRN

## 2020-12-22 MED ORDER — CALCIUM ACETATE (PHOS BINDER) 667 MG PO CAPS
667.0000 mg | ORAL_CAPSULE | Freq: Three times a day (TID) | ORAL | Status: DC
  Administered 2020-12-22 – 2020-12-24 (×4): 667 mg via ORAL
  Filled 2020-12-22 (×4): qty 1

## 2020-12-22 MED ORDER — CLOPIDOGREL BISULFATE 75 MG PO TABS
75.0000 mg | ORAL_TABLET | Freq: Every day | ORAL | Status: DC
  Administered 2020-12-23 – 2020-12-24 (×3): 75 mg via ORAL
  Filled 2020-12-22 (×3): qty 1

## 2020-12-22 NOTE — ED Notes (Signed)
Urinal at bedside.  

## 2020-12-22 NOTE — ED Triage Notes (Signed)
Pt here from Nursing home with no complaint , pt fell last week bu has no complaints from that fall , pt was due for dialysis today

## 2020-12-22 NOTE — ED Provider Notes (Signed)
[facility] EMERGENCY DEPARTMENT Provider Note   CSN: [id number] Arrival date & time: 12/22/20  1019     History No chief complaint on file.   Gregory Hood is a 70 y.o. male with pertinent past medical history of multiple prior strokes, uncontrolled diabetes, hypertension, Alzheimer's dementia, end-stage renal disease on Monday Wednesday Friday dialysis with sinus sick syndrome status post pacer who has been in a nursing facility for 2 years now that presents to the emergency department today for altered mental status.  Patient is level 5 caveat due to Alzheimer's and altered mental status, wife is able to tell most of HPI.  Wife states that he has been acting differently since he fell on Wednesday at nursing facility.  She states that he is normally alert to self and place, however this morning and for the past couple of days he has been acting differently, not alert to self or situation or place.  She states that he has been staring blankly into space and has been complaining that his head has been hurting since he fell.  Per wif, patient fell at the facility on Wednesday after dialysis.  She states that she is unsure what happened, however he was on the floor for unknown period of time.  Approximately 1 hour according to patient.  Patient did hit his head, has small hematoma on his head.  Patient is not complaining of anything right now besides incision pain where he recently had a hernia removed a couple days ago.  Denies any nausea vomiting fever chest pain shortness of breath or being fluid overloaded.  Wife states that he was too weak to go to his dialysis on Friday and did not go to his dialysis today, has missed 2 days of dialysis.  Prior to this he has not missed any other days.  Patient gets most of his [facility] at [facility]. On Plavix.      Past Medical History:  Diagnosis Date   Chronic kidney disease    COPD (chronic obstructive pulmonary disease) ([place])    Diabetes  mellitus without complication ([place])    Diabetic retinopathy ([place])    Hyperlipidemia    Hypertension    Obesity    Orthostatic hypotension    OSA on CPAP    Retinal detachment    Seizures ([place])    Stroke ([place])    TIA (transient ischemic attack)    Umbilical hernia     Patient Active Problem List   Diagnosis Date Noted   Wide-complex tachycardia ([place]) 10/08/2020   Syncope 10/08/2020   History of completed stroke 07/11/2018   Acute encephalopathy 07/11/2018   Cellulitis of right leg 07/11/2018   Hypertensive urgency 07/11/2018   Acute exacerbation of CHF (congestive heart failure) ([place]) 03/22/2018   CVA (cerebral vascular accident) ([place]) 03/22/2018   OSA (obstructive sleep apnea) 03/22/2018   BPH (benign prostatic hyperplasia) 03/22/2018   Thrombocytopenia ([place]) 03/22/2018   Normocytic anemia 99991111   Metabolic acidosis 99991111   Uremia 03/22/2018   End stage renal disease ([place]) 03/14/2014   Orthostatic hypotension 02/22/2014   Elevated troponin 02/22/2014   Chronic diastolic heart failure ([place]) 02/22/2014   Elevated lipids 02/22/2014   Essential hypertension, benign 02/17/2014   DM (diabetes mellitus), type 2 with renal complications ([place]) [id number]   Abnormal ECG 02/17/2014    Past Surgical History:  Procedure Laterality Date   BASCILIC VEIN TRANSPOSITION Left 03/06/2015   Procedure: LET [place];  Surgeon: Angelia Mould, MD;  Location: [facility] OR;  Service: Vascular;  Laterality: Left;   CIRCUMCISION     EYE SURGERY Bilateral    had surgery several different times.   HAMMER TOE SURGERY Left 30 years ago   small toe left toe   HERNIA REPAIR     IR AV DIALY SHUNT INTRO NEEDLE/INTRACATH INITIAL W/PTA/IMG LEFT  07/20/2018   SEPTOPLASTY  20 years ago       Family History  Problem Relation Age of Onset   Hypertension Mother    Diabetes type II Mother    Transient ischemic attack Mother    Kidney disease Mother    Diabetes Mother     Varicose Veins Mother    Hypertension Father    Diabetes type II Father    Diabetes Father    Diabetes type II Sister    Hypertension Sister    Diabetes Sister    Learning disabilities Maternal Uncle     Social History   Tobacco Use   Smoking status: Former    Pack years: 0.00    Types: Cigarettes    Quit date: 03/05/1974    Years since quitting: 46.8   Smokeless tobacco: Never  Vaping Use   Vaping Use: Never used  Substance Use Topics   Alcohol use: No    Alcohol/week: 0.0 standard drinks   Drug use: No    Home Medications Prior to Admission medications   Medication Sig Start Date End Date Taking? Authorizing Provider  acetaminophen (TYLENOL) 500 MG tablet Take 500 mg by mouth daily.   Yes [provider]  aspirin 81 MG tablet Take 1 tablet (81 mg total) by mouth daily. Patient taking differently: Take 81 mg by mouth at bedtime. 03/30/18  Yes Hongalgi, Lenis Dickinson, MD  bisacodyl (DULCOLAX) 10 MG suppository Place 10 mg rectally as needed for moderate constipation.   Yes [provider]  calcium acetate (PHOSLO) 667 MG capsule Take 667 mg by mouth 3 (three) times daily. 08/29/20  Yes [provider]  carvedilol (COREG) 3.125 MG tablet Take 1 tablet (3.125 mg total) by mouth 2 (two) times daily with a meal. Take after dialysis on dialysis days 10/11/20  Yes Debbe Odea, MD  cholecalciferol 2000 units TABS Take 1 tablet (2,000 Units total) by mouth daily. Patient taking differently: Take 1,000 Units by mouth daily. 03/31/18  Yes Hongalgi, Lenis Dickinson, MD  clopidogrel (PLAVIX) 75 MG tablet Take 75 mg by mouth daily. 03/10/18  Yes [provider]  cyanocobalamin (,VITAMIN B-12,) 1000 MCG/ML injection Inject 1,000 mcg into the muscle every 30 (thirty) days. On the 26th   Yes [provider]  Dextrose, Diabetic Use, (GLUCOSE) 77.4 % GEL Take 1 Dose by mouth as needed (blood glucose less than 70).   Yes [provider]  EPINEPHrine 0.3 mg/0.3  mL IJ SOAJ injection Inject 0.3 mg into the muscle once as needed for anaphylaxis. 10/12/20  Yes [provider]  ezetimibe (ZETIA) 10 MG tablet Take 10 mg by mouth every evening. 04/22/19  Yes [provider]  finasteride (PROSCAR) 5 MG tablet Take 5 mg by mouth daily. 03/26/19  Yes [provider]  folic acid (FOLVITE) 1 MG tablet Take 2 tablets (2 mg total) by mouth daily. 04/03/14  Yes Josue Hector, MD  Glucagon, rDNA, (GLUCAGON EMERGENCY IJ) Inject 1 mg into the muscle as needed (blood sugar less than 70).   Yes [provider]  Insulin Aspart FlexPen 100 UNIT/ML SOPN Inject 2-10 Units into  the skin See admin instructions. Bid per sliding scale Less than 150= 0 units, 151-200= 2 units, 201-250=4 units, 251-300= 6 units, 301-350= 8 units, 351-400= 10 units, if less than 70 or over 401 call md 12/16/20  Yes [provider]  memantine (NAMENDA) 10 MG tablet Take 10 mg by mouth 2 (two) times daily.   Yes [provider]  multivitamin (RENA-VIT) TABS tablet Take 1 tablet by mouth daily.   Yes [provider]  niacin 100 MG tablet Take 200 mg by mouth every evening.   Yes [provider]  ondansetron (ZOFRAN-ODT) 4 MG disintegrating tablet Take 4 mg by mouth every 4 (four) hours as needed for nausea or vomiting.   Yes [provider]  OVER THE COUNTER MEDICATION Take 1 Dose by mouth every evening. Yogurt for weight loss   Yes [provider]  polyethylene glycol (MIRALAX / GLYCOLAX) packet Take 17 g by mouth 2 (two) times daily. Patient taking differently: Take 17 g by mouth daily as needed for mild constipation. 03/30/18  Yes Hongalgi, Lenis Dickinson, MD  prednisoLONE acetate (PRED FORTE) 1 % ophthalmic suspension Place 1 drop into the left eye at bedtime. Patient taking differently: Place 1 drop into the left eye 4 (four) times daily. 07/18/18  Yes Kc, Maren Beach, MD  Red Yeast Rice 600 MG CAPS Take 1,200 mg by mouth at  bedtime.   Yes [provider]  senna-docusate (SENOKOT-S) 8.6-50 MG tablet Take 2 tablets by mouth daily. Patient taking differently: Take 1 tablet by mouth 2 (two) times daily. 07/19/18  Yes Antonieta Pert, MD  Sodium Phosphates (FLEET ENEMA RE) Place 1 Dose rectally as needed (constipation).   Yes [provider]  ULTRAM 50 MG tablet Take 25 mg by mouth every 12 (twelve) hours as needed (HTN x 5 days). 12/16/20  Yes [provider]  Nutritional Supplements (,FEEDING SUPPLEMENT, PROSOURCE PLUS) liquid Take 30 mLs by mouth 2 (two) times daily between meals. Patient not taking: Reported on 12/22/2020 10/11/20   Debbe Odea, MD  Nutritional Supplements (FEEDING SUPPLEMENT, NEPRO CARB STEADY,) LIQD Take 237 mLs by mouth 2 (two) times daily between meals. Patient not taking: Reported on 12/22/2020 10/11/20   Debbe Odea, MD    Allergies    Bee venom, Lexiscan [regadenoson], Statins, and Ace inhibitors  Review of Systems   Review of Systems  Unable to perform ROS: Dementia   Physical Exam Updated Vital Signs BP (!) 168/85   Pulse 71   Temp 98.1 F (36.7 C) (Oral)   Resp 12   SpO2 99%   Physical Exam Constitutional:      General: He is not in acute distress.    Appearance: Normal appearance. He is not ill-appearing, toxic-appearing or diaphoretic.  HENT:     Mouth/Throat:     Mouth: Mucous membranes are moist.     Pharynx: Oropharynx is clear.  Eyes:     General: No scleral icterus.    Extraocular Movements: Extraocular movements intact.     Pupils: Pupils are equal, round, and reactive to light.  Cardiovascular:     Rate and Rhythm: Normal rate and regular rhythm.     Pulses: Normal pulses.     Heart sounds: Normal heart sounds.  Pulmonary:     Effort: Pulmonary effort is normal. No respiratory distress.     Breath sounds: Normal breath sounds. No stridor. No wheezing, rhonchi or rales.  Chest:     Chest wall: No tenderness.  Abdominal:  General:  Abdomen is flat. There is no distension.     Palpations: Abdomen is soft.     Tenderness: There is no abdominal tenderness. There is no guarding or rebound.  Genitourinary:    Comments: Incision for hernia without any signs of infection, appears as if is healing well. Musculoskeletal:        General: No swelling or tenderness. Normal range of motion.     Cervical back: Normal range of motion and neck supple. No rigidity.     Right lower leg: No edema.     Left lower leg: No edema.  Skin:    General: Skin is warm and dry.     Capillary Refill: Capillary refill takes less than 2 seconds.     Coloration: Skin is not pale.  Neurological:     General: No focal deficit present.     Mental Status: He is alert and oriented to person, place, and time.     Comments: Alert to self and place. Not oriented to time.  Clear speech. No facial droop. CNIII-XII grossly intact. Bilateral upper and lower extremities' sensation grossly intact. 5/5 symmetric strength with grip strength and with plantar and dorsi flexion bilaterally.  Normal finger to nose bilaterally. Negative pronator drift.     Psychiatric:        Mood and Affect: Mood normal.        Behavior: Behavior normal.    ED Results / Procedures / Treatments   Labs (all labs ordered are listed, but only abnormal results are displayed) Labs Reviewed  CBC - Abnormal; Notable for the following components:      Result Value   RBC 3.25 (*)    Hemoglobin 9.9 (*)    HCT 31.3 (*)    All other components within normal limits  COMPREHENSIVE METABOLIC PANEL - Abnormal; Notable for the following components:   Chloride 95 (*)    Glucose, Bld 107 (*)    BUN 69 (*)    Creatinine, Ser 12.37 (*)    GFR, Estimated 4 (*)    All other components within normal limits  URINALYSIS, COMPLETE (UACMP) WITH MICROSCOPIC - Abnormal; Notable for the following components:   Hgb urine dipstick SMALL (*)    Protein, ur 100 (*)    All other components within normal  limits  RESP PANEL BY RT-PCR (FLU A&B, COVID) [id number]  AMMONIA  CK  HEMOGLOBIN A1C    EKG EKG Interpretation  Date/Time:  Monday December 22 2020 13:47:14 EDT Ventricular Rate:  72 PR Interval:  207 QRS Duration: 91 QT Interval:  434 QTC Calculation: 465 R Axis:   85 Text Interpretation: Sinus rhythm Atrial premature complexes Borderline right axis deviation Borderline T abnormalities, inferior leads NSR Confirmed by Lavenia Atlas [phone]) on 12/22/2020 1:51:18 PM  Radiology CT HEAD WO CONTRAST  Result Date: 12/22/2020 CLINICAL DATA:  Altered mental status EXAM: CT HEAD WITHOUT CONTRAST TECHNIQUE: Contiguous axial images were obtained from the base of the skull through the vertex without intravenous contrast. COMPARISON:  10/08/2020 FINDINGS: Brain: Diffuse atrophic changes are identified with chronic white matter ischemic change. Findings of prior infarct in the cerebellar hemispheres bilaterally stable in appearance from the prior exam. Small lacunar infarcts are noted within the thalami bilaterally. Basal ganglia calcifications are noted. Vascular: No hyperdense vessel or unexpected calcification. Skull: Normal. Negative for fracture or focal lesion. Sinuses/Orbits: No acute finding. Other: None IMPRESSION: Chronic atrophic and ischemic changes without acute abnormality. Electronically Signed   By: Elta [place]  Lukens M.D.   On: 12/22/2020 14:05    Procedures Procedures   Medications Ordered in ED Medications  insulin aspart (novoLOG) injection 0-6 Units (has no administration in time range)    ED Course  I have reviewed the triage vital signs and the nursing notes.  Pertinent labs & imaging results that were available during my care of the patient were reviewed by me and considered in my medical decision making (see chart for details).    MDM Rules/Calculators/A&P                         Patient presents to the emergency department today for missing dialysis and altered mental status  by wife.  Patient does have a history of dementia, however wife states that he is not at baseline after fall.  Patient does not appear fluid overloaded or in respiratory distress after missing 2 days of dialysis.  Will obtain CT head scan and other labs for altered mental status at this time, however most likely traumatic after fall.  Work-up today unremarkable with CT head not showing any signs of acute bleed or fracture. Nephrology consulted.   Shared decision making with wife at this time, we both decided that patient to be admitted since he has missed 2 days of dialysis, is weak and is having worsening altered mental status.  Patient admitted to Dr. Roel Cluck.  The patient appears reasonably stabilized for admission considering the current resources, flow, and capabilities available in the ED at this time, and I doubt any other [facility] requiring further screening and/or treatment in the ED prior to admission.  Final Clinical Impression(s) / ED Diagnoses Final diagnoses:  Altered mental status, unspecified altered mental status type  Weakness    Rx / DC Orders ED Discharge Orders     None        Alfredia Client, PA-C 12/22/20 1909    Lorelle Gibbs, DO 12/25/20 1110

## 2020-12-22 NOTE — ED Notes (Signed)
Provided patient dinner tray. Pulled patient up in bed and placed tray in front of patient. Cut meat up for patient and opened other items to assist in feeding. Patient requesting to sit on side of bed to eat. Informed patient that since he is feeling weak I would rather him sit in bed and eat until family returns to sit with patient to assure no falls. Patient states "well get me someone" while sitting up to eat. Informed him I would try to find staff to assist. Otherwise, instructed to try to eat a few bites of food before family returns from parking lot. On leaving room patient was making no attempt to eat. Oncoming nurse made aware of situation.

## 2020-12-22 NOTE — Progress Notes (Signed)
Pt refused to wear the CPAP tonight.

## 2020-12-22 NOTE — Progress Notes (Signed)
Received male pt from ER via stretcher alert, not in distress on room air, pt transferred self with minimal assist from stretcher to bed, activities well tolerated, connected pt to tele box 32 inform tele dept.,assist skin integrity with RN Sian,noted healed skin incision at left groin, orient pt to room and use of call bell, pt agreed to use the nurse call bell before going out bed

## 2020-12-22 NOTE — H&P (Signed)
Gregory Hood [id number] DOB: 01-23-1951 DOA: 12/22/2020     PCP: Gregory Panda, MD   Outpatient Specialists:   CARDS: Gregory Jude, MD      Patient arrived to ER on 12/22/20 at 1019 Referred by Attending Gregory Baker, MD   Patient coming from:  SNF    Chief Complaint: confusion   HPI: Gregory Hood is a 70 y.o. male with medical history significant of ESRD oh HD M,W,F, DM2, chronic diastolic CHF, HTN, dementia multiple prior strokes, SSS s/p pacer     Presented with  fall from SNF wife says he was on the floor for a long time. She brought him from SNF to ER. Pt fell down last week since then has been too weak to ambulate. Last HD was Wednesday last week.  Since then he have missed at least 2 days of dialysis. Per family has been acting differently ever since his fall. Per wife he is alert to situation and place. Currently patient appears to be bit confused of why he is here but stating he is very strong and able to run around the building Reports a bit of constipation No nausea no vomiting no fever no chest pain or shortness of breath. Mild leg edema bilateral.    Has  been vaccinated against COVID    Initial COVID TEST   in house  PCR testing  Pending  Lab Results  Component Value Date   [place] NEGATIVE 10/08/2020      Regarding pertinent Chronic problems:    Hyperlipidemia -  on statins, [facility]ia Lipid Panel     Component Value Date/Time   CHOL 156 02/18/2014 0014   TRIG 105 02/18/2014 0014   HDL 38 (L) 02/18/2014 0014   CHOLHDL 4.1 02/18/2014 0014   VLDL 21 02/18/2014 0014   LDLCALC 97 02/18/2014 0014    HTN on Coreg   chronic CHF diastolic  - last echo April [id number]  Grade I diastolic dysfunction      DM 2 -  Lab Results  Component Value Date   HGBA1C 6.1 (H) 10/08/2020   on insulin, SSi      COPD - not  followed by pulmonology  not  on baseline oxygen     OSA -on  CPAP,      Hx of CVA -  with/out residual  deficits on Aspirin 81 mg,  Plavix   ESRD on HD M,W,F CrCl cannot be calculated (Unknown ideal weight.).  Lab Results  Component Value Date   CREATININE 12.37 (H) 12/22/2020   CREATININE 6.01 (H) 10/09/2020   CREATININE 4.96 (H) 10/08/2020    Dementia - on Nemenda    Chronic anemia - baseline hg Hemoglobin & Hematocrit  Recent Labs    10/08/20 1723 12/22/20 1107  HGB 10.5* 9.9*     While in ER: Noted to have increased confusion CT head WNL Nephrology made aware for HD in AM    ED Triage Vitals  Enc Vitals Group     BP 12/22/20 1028 (!) 146/77     Pulse Rate 12/22/20 1028 73     Resp 12/22/20 1028 17     Temp 12/22/20 1028 98.1 F (36.7 C)     Temp Source 12/22/20 1028 Oral     SpO2 12/22/20 1028 100 %     Weight --      Height --      Head Circumference --      Peak Flow --  Pain Score 12/22/20 1057 0     Pain Loc --      Pain Edu? --      Excl. in [place]? --   TMAX(24)@     _________________________________________ Significant initial  Findings: Abnormal Labs Reviewed  CBC - Abnormal; Notable for the following components:      Result Value   RBC 3.25 (*)    Hemoglobin 9.9 (*)    HCT 31.3 (*)    All other components within normal limits  COMPREHENSIVE METABOLIC PANEL - Abnormal; Notable for the following components:   Chloride 95 (*)    Glucose, Bld 107 (*)    BUN 69 (*)    Creatinine, Ser 12.37 (*)    GFR, Estimated 4 (*)    All other components within normal limits  URINALYSIS, COMPLETE (UACMP) WITH MICROSCOPIC - Abnormal; Notable for the following components:   Hgb urine dipstick SMALL (*)    Protein, ur 100 (*)    All other components within normal limits   ____________________________________________  CT HEAD  NON acute  CXR - Ordered   KUB- Ordered  _________________________   ECG: Ordered Personally reviewed by me showing: HR : 72 Rhythm:  NSR,   no evidence of ischemic changes QTC 465  The recent clinical data is shown  below. Vitals:   12/22/20 1600 12/22/20 1645 12/22/20 1715 12/22/20 1800  BP: (!) 170/88 (!) 158/80 (!) 150/82 (!) 168/85  Pulse: 68 70 71 71  Resp: '12 12 11 12  '$ Temp:      TempSrc:      SpO2: 99% 99% 100% 99%    WBC     Component Value Date/Time   WBC 5.1 12/22/2020 1107   LYMPHSABS 0.6 (L) 10/08/2020 1723   MONOABS 0.6 10/08/2020 1723   EOSABS 0.1 10/08/2020 1723   BASOSABS 0.0 10/08/2020 1723        UA  no evidence of UTI     Urine analysis:    Component Value Date/Time   COLORURINE YELLOW 12/22/2020 1645   APPEARANCEUR CLEAR 12/22/2020 1645   LABSPEC 1.014 12/22/2020 1645   PHURINE 7.0 12/22/2020 1645   GLUCOSEU NEGATIVE 12/22/2020 1645   HGBUR SMALL (A) 12/22/2020 1645   BILIRUBINUR NEGATIVE 12/22/2020 1645   KETONESUR NEGATIVE 12/22/2020 1645   PROTEINUR 100 (A) 12/22/2020 1645   UROBILINOGEN 0.2 09/24/2014 0216   NITRITE NEGATIVE 12/22/2020 1645   LEUKOCYTESUR NEGATIVE 12/22/2020 1645      _______________________________________________________ ER Provider Called:    nephrology  Dr. Joelyn Oms They Recommend admit to medicine  Will see in AM    _______________________________________________ Hospitalist was called for admission for debility  The following Work up has been ordered so far:  Orders Placed This Encounter  Procedures   Resp Panel by RT-PCR (Flu A&B, Covid) Nasopharyngeal Swab   CT HEAD WO CONTRAST   DG Chest Port 1 View   CBC   Comprehensive metabolic panel   Urinalysis, Complete w Microscopic   Ammonia   CK   Diet renal 60/70-07-30-1198 Fluid restriction: 1200 mL Fluid; Room service appropriate? Yes; Fluid consistency: Thin   Cardiac monitoring   Cardiac monitoring   Consult to hospitalist   Consult to nephrology   Airborne and Contact precautions   Pulse oximetry, continuous   ED EKG   EKG 12-Lead   EKG 12-Lead   Place in observation (patient's expected length of stay will be less than 2 midnights)     Following Medications were  ordered in  ER: Medications - No data to display      Consult Orders  (From admission, onward)           Start     Ordered   12/22/20 1759  Consult to hospitalist  Once       Provider:  (Not yet assigned)  Question Answer Comment  Place call to: [facility]   Reason for Consult Admit      12/22/20 1758              OTHER Significant initial  Findings:  labs showing:    Recent Labs  Lab 12/22/20 1107  NA 136  K 4.0  CO2 26  GLUCOSE 107*  BUN 69*  CREATININE 12.37*  CALCIUM 9.1    Cr   Up from baseline see below Lab Results  Component Value Date   CREATININE 12.37 (H) 12/22/2020   CREATININE 6.01 (H) 10/09/2020   CREATININE 4.96 (H) 10/08/2020    Recent Labs  Lab 12/22/20 1107  AST 30  ALT 11  ALKPHOS 59  BILITOT 0.7  PROT 7.3  ALBUMIN 3.5   Lab Results  Component Value Date   CALCIUM 9.1 12/22/2020   PHOS 4.5 07/21/2018          Plt: Lab Results  Component Value Date   PLT 260 12/22/2020      COVID-19 Labs  No results for input(s): DDIMER, FERRITIN, LDH, CRP in the last 72 hours.  Lab Results  Component Value Date   SARSCOV2NAA NEGATIVE 10/08/2020       Recent Labs  Lab 12/22/20 1107  WBC 5.1  HGB 9.9*  HCT 31.3*  MCV 96.3  PLT 260    HG/HCT  stable,       Component Value Date/Time   HGB 9.9 (L) 12/22/2020 1107   HCT 31.3 (L) 12/22/2020 1107   HCT 38.2 07/11/2018 2330   MCV 96.3 12/22/2020 1107      No results for input(s): LIPASE, AMYLASE in the last 168 hours. Recent Labs  Lab 12/22/20 1239  AMMONIA 22     Cardiac Panel (last 3 results) Recent Labs    12/22/20 1239  CKTOTAL 227     BNP (last 3 results) No results for input(s): BNP in the last 8760 hours.    DM  labs:  HbA1C: Recent Labs    10/08/20 2301  HGBA1C 6.1*       CBG (last 3)  No results for input(s): GLUCAP in the last 72 hours.        Cultures: No results found for: SDES, SPECREQUEST, CULT, REPTSTATUS    Radiological Exams on Admission: CT HEAD WO CONTRAST  Result Date: 12/22/2020 CLINICAL DATA:  Altered mental status EXAM: CT HEAD WITHOUT CONTRAST TECHNIQUE: Contiguous axial images were obtained from the base of the skull through the vertex without intravenous contrast. COMPARISON:  10/08/2020 FINDINGS: Brain: Diffuse atrophic changes are identified with chronic white matter ischemic change. Findings of prior infarct in the cerebellar hemispheres bilaterally stable in appearance from the prior exam. Small lacunar infarcts are noted within the thalami bilaterally. Basal ganglia calcifications are noted. Vascular: No hyperdense vessel or unexpected calcification. Skull: Normal. Negative for fracture or focal lesion. Sinuses/Orbits: No acute finding. Other: None IMPRESSION: Chronic atrophic and ischemic changes without acute abnormality. Electronically Signed   By: Inez Catalina M.D.   On: 12/22/2020 14:05   _______________________________________________________________________________________________________ Latest  Blood pressure (!) 168/85, pulse 71, temperature 98.1 F (36.7 C), temperature source Oral,  resp. rate 12, SpO2 99 %.   Review of Systems:    Pertinent positives include:  fatigue confusion   Constitutional:  No weight loss, night sweats, Fevers, chills, , weight loss  HEENT:  No headaches, Difficulty swallowing,Tooth/dental problems,Sore throat,  No sneezing, itching, ear ache, nasal congestion, post nasal drip,  Cardio-vascular:  No chest pain, Orthopnea, PND, anasarca, dizziness, palpitations.no Bilateral lower extremity swelling  GI:  No heartburn, indigestion, abdominal pain, nausea, vomiting, diarrhea, change in bowel habits, loss of appetite, melena, blood in stool, hematemesis Resp:  no shortness of breath at rest. No dyspnea on exertion, No excess mucus, no productive cough, No non-productive cough, No coughing up of blood.No change in color of mucus.No wheezing. Skin:   no rash or lesions. No jaundice GU:  no dysuria, change in color of urine, no urgency or frequency. No straining to urinate.  No flank pain.  Musculoskeletal:  No joint pain or no joint swelling. No decreased range of motion. No back pain.  Psych:  No change in mood or affect. No depression or anxiety. No memory loss.  Neuro: no localizing neurological complaints, no tingling, no weakness, no double vision, no gait abnormality, no slurred speech, no   All systems reviewed and apart from [place] all are negative _______________________________________________________________________________________________ Past Medical History:   Past Medical History:  Diagnosis Date   Chronic kidney disease    COPD (chronic obstructive pulmonary disease) ([place])    Diabetes mellitus without complication ([place])    Diabetic retinopathy ([employer])    Hyperlipidemia    Hypertension    Obesity    Orthostatic hypotension    OSA on CPAP    Retinal detachment    Seizures ([place])    Stroke ([place])    TIA (transient ischemic attack)    Umbilical hernia       Past Surgical History:  Procedure Laterality Date   BASCILIC VEIN TRANSPOSITION Left 03/06/2015   Procedure: LET [place];  Surgeon: Angelia Mould, MD;  Location: [facility] OR;  Service: Vascular;  Laterality: Left;   CIRCUMCISION     EYE SURGERY Bilateral    had surgery several different times.   HAMMER TOE SURGERY Left 30 years ago   small toe left toe   HERNIA REPAIR     IR AV DIALY SHUNT INTRO NEEDLE/INTRACATH INITIAL W/PTA/IMG LEFT  07/20/2018   SEPTOPLASTY  20 years ago    Social History:  Ambulatory   walker        reports that he quit smoking about 46 years ago. His smoking use included cigarettes. He has never used smokeless tobacco. He reports that he does not drink alcohol and does not use drugs.     Family History:   Family History  Problem Relation Age of Onset   Hypertension Mother    Diabetes type II Mother     Transient ischemic attack Mother    Kidney disease Mother    Diabetes Mother    Varicose Veins Mother    Hypertension Father    Diabetes type II Father    Diabetes Father    Diabetes type II Sister    Hypertension Sister    Diabetes Sister    Learning disabilities Maternal Uncle    ______________________________________________________________________________________________ Allergies: Allergies  Allergen Reactions   Bee Venom Swelling   Lexiscan [Regadenoson] Other (See Comments)    Seizure like-activity after lexiscan given.   Statins     Other reaction(s): Other (See Comments) Myalgias and CK increase.  Tried both atorvastatin and rosuvastatin    Ace Inhibitors Cough     Prior to Admission medications   Medication Sig Start Date End Date Taking? Authorizing Provider  acetaminophen (TYLENOL) 500 MG tablet Take 500 mg by mouth daily.   Yes [provider]  aspirin 81 MG tablet Take 1 tablet (81 mg total) by mouth daily. Patient taking differently: Take 81 mg by mouth at bedtime. 03/30/18  Yes Hongalgi, Lenis Dickinson, MD  bisacodyl (DULCOLAX) 10 MG suppository Place 10 mg rectally as needed for moderate constipation.   Yes [provider]  calcium acetate (PHOSLO) 667 MG capsule Take 667 mg by mouth 3 (three) times daily. 08/29/20  Yes [provider]  carvedilol (COREG) 3.125 MG tablet Take 1 tablet (3.125 mg total) by mouth 2 (two) times daily with a meal. Take after dialysis on dialysis days 10/11/20  Yes Debbe Odea, MD  cholecalciferol 2000 units TABS Take 1 tablet (2,000 Units total) by mouth daily. Patient taking differently: Take 1,000 Units by mouth daily. 03/31/18  Yes Hongalgi, Lenis Dickinson, MD  clopidogrel (PLAVIX) 75 MG tablet Take 75 mg by mouth daily. 03/10/18  Yes [provider]  cyanocobalamin (,VITAMIN B-12,) 1000 MCG/ML injection Inject 1,000 mcg into the muscle every 30 (thirty) days. On the 26th   Yes [provider]   Dextrose, Diabetic Use, (GLUCOSE) 77.4 % GEL Take 1 Dose by mouth as needed (blood glucose less than 70).   Yes [provider]  EPINEPHrine 0.3 mg/0.3 mL IJ SOAJ injection Inject 0.3 mg into the muscle once as needed for anaphylaxis. 10/12/20  Yes [provider]  ezetimibe (ZETIA) 10 MG tablet Take 10 mg by mouth every evening. 04/22/19  Yes [provider]  finasteride (PROSCAR) 5 MG tablet Take 5 mg by mouth daily. 03/26/19  Yes [provider]  folic acid (FOLVITE) 1 MG tablet Take 2 tablets (2 mg total) by mouth daily. 04/03/14  Yes Josue Hector, MD  Glucagon, rDNA, (GLUCAGON EMERGENCY IJ) Inject 1 mg into the muscle as needed (blood sugar less than 70).   Yes [provider]  Insulin Aspart FlexPen 100 UNIT/ML SOPN Inject 2-10 Units into the skin See admin instructions. Bid per sliding scale Less than 150= 0 units, 151-200= 2 units, 201-250=4 units, 251-300= 6 units, 301-350= 8 units, 351-400= 10 units, if less than 70 or over 401 call md 12/16/20  Yes [provider]  memantine (NAMENDA) 10 MG tablet Take 10 mg by mouth 2 (two) times daily.   Yes [provider]  multivitamin (RENA-VIT) TABS tablet Take 1 tablet by mouth daily.   Yes [provider]  niacin 100 MG tablet Take 200 mg by mouth every evening.   Yes [provider]  ondansetron (ZOFRAN-ODT) 4 MG disintegrating tablet Take 4 mg by mouth every 4 (four) hours as needed for nausea or vomiting.   Yes [provider]  OVER THE COUNTER MEDICATION Take 1 Dose by mouth every evening. Yogurt for weight loss   Yes [provider]  polyethylene glycol (MIRALAX / GLYCOLAX) packet Take 17 g by mouth 2 (two) times daily. Patient taking differently: Take 17 g by mouth daily as needed for mild constipation. 03/30/18  Yes Hongalgi, Lenis Dickinson, MD  prednisoLONE acetate (PRED FORTE) 1 % ophthalmic suspension Place 1 drop into the left eye at  bedtime. Patient taking differently: Place 1 drop into the left eye 4 (four) times daily. 07/18/18  Yes Kc,  Maren [place], MD  Red Yeast Rice 600 MG CAPS Take 1,200 mg by mouth at bedtime.   Yes [provider]  senna-docusate (SENOKOT-S) 8.6-50 MG tablet Take 2 tablets by mouth daily. Patient taking differently: Take 1 tablet by mouth 2 (two) times daily. 07/19/18  Yes Antonieta Pert, MD  Sodium Phosphates (FLEET ENEMA RE) Place 1 Dose rectally as needed (constipation).   Yes [provider]  ULTRAM 50 MG tablet Take 25 mg by mouth every 12 (twelve) hours as needed (HTN x 5 days). 12/16/20  Yes [provider]  Nutritional Supplements (,FEEDING SUPPLEMENT, PROSOURCE PLUS) liquid Take 30 mLs by mouth 2 (two) times daily between meals. Patient not taking: Reported on 12/22/2020 10/11/20   Debbe Odea, MD  Nutritional Supplements (FEEDING SUPPLEMENT, NEPRO CARB STEADY,) LIQD Take 237 mLs by mouth 2 (two) times daily between meals. Patient not taking: Reported on 12/22/2020 10/11/20   Debbe Odea, MD    ___________________________________________________________________________________________________ Physical Exam: Vitals with BMI 12/22/2020 12/22/2020 12/22/2020  Height - - -  Weight - - -  BMI - - -  Systolic [id number]  Diastolic 85 82 80  Pulse 71 71 70     1. General:  in No  Acute distress   Chronically ill  -appearing 2. Psychological: Alert and Oriented 3. Head/ENT:   Dry Mucous Membranes                          Head Non traumatic, neck supple                           Poor Dentition 4. SKIN:  decreased Skin turgor,  Skin clean Dry and intact no rash 5. Heart: Regular rate and rhythm no Murmur, no Rub or gallop 6. Lungs:   no wheezes or crackles   7. Abdomen: Soft,  RLQ-tender says ever since his hernia repair, Non distended  obese  bowel sounds present 8. Lower extremities: no clubbing, cyanosis, trace edema 9. Neurologically Grossly intact, moving all 4  extremities equally   10. MSK: Normal range of motion    Chart has been reviewed  ______________________________________________________________________________________________  Assessment/Plan 71 y.o. male with medical history significant of ESRD oh HD M,W,F, DM2, chronic diastolic CHF, HTN, dementia multiple prior strokes, SSS s/p pacer   Admitted for debility, acute encephalopathy, missing HD  Present on Admission:  Acute encephalopathy -sundowning in setting of dementia and recent falls.  Evaluate for any source of infection.  Uremia may be also contributing, currently seems more alert and oriented CXR pening   OSA (obstructive sleep apnea) - CPAP   Normocytic anemia - stable anemia panel ordered   Essential hypertension, benign - stable continue Coreg   End stage renal disease ([place]) - nephrology aware plan for HD in AM no indication for emergent dialysis at Time   DM (diabetes mellitus), type 2 with renal complications ([place]) -  - Order Sensitive   SSI   -  check TSH and HgA1C  - Hold by mouth medications   Chronic diastolic heart failure ([place]) fluid controlled with dialysis.  Continue to monitor currently appears to be stable  History of CVA continue Plavix no localized neurological deficits at this time   Other plan as per orders.  DVT prophylaxis:  SCD        Code Status:    Code Status: Prior FULL CODE   as per patient  Family Communication:   Family not at  Bedside    Disposition Plan:                           Back to current facility when stable                               Following barriers for discharge:                            Electrolytes corrected                                                            Will need to be able to tolerate PO                                               Will need consultants to evaluate patient prior to discharge   EXPECT DC tomorrow                    Would benefit from PT/OT eval prior to DC  Ordered                    Swallow eval - SLP ordered                   Diabetes care coordinator                   Transition of care consulted                   Nutrition    consulted                                       Consults called: nephrology  Admission status:  ED Disposition     ED Disposition  [place]: [facility] [[id number]]  Level of Care: Telemetry Medical [[id number]]  May place patient in observation at [facility] or [facility] if equivalent level of care is available:: No  Covid Evaluation: Asymptomatic Screening Protocol (No Symptoms)  Diagnosis: Acute encephalopathy [id number]  Admitting Physician: Gregory Hood [[id number]]  Attending Physician: Gregory Hood [[id number]]           Obs     Level of care   tele  For 12H      Lab Results  Component Value Date   [place] 10/08/2020     Precautions: admitted as  asymptomatic screening protocol     PPE: Used by the provider:   N95  eye Goggles,  Gloves      Shequilla Goodgame 12/22/2020, 8:06 PM    [facility]     after 2 AM please page floor coverage PA If 7AM-7PM, please contact the day team taking care of the patient using [email or web address]   Patient was evaluated in the context of the global COVID-19 pandemic,  which necessitated consideration that the patient might be at risk for infection with the SARS-CoV-2 virus that causes COVID-19. Institutional protocols and algorithms that pertain to the evaluation of patients at risk for COVID-19 are in a state of rapid change based on information released by regulatory bodies including the CDC and federal and state organizations. These policies and algorithms were followed during the patient's care.

## 2020-12-23 ENCOUNTER — Other Ambulatory Visit: Payer: Self-pay

## 2020-12-23 ENCOUNTER — Encounter (HOSPITAL_COMMUNITY): Payer: Self-pay | Admitting: Internal Medicine

## 2020-12-23 DIAGNOSIS — K5900 Constipation, unspecified: Secondary | ICD-10-CM | POA: Diagnosis present

## 2020-12-23 DIAGNOSIS — I495 Sick sinus syndrome: Secondary | ICD-10-CM | POA: Diagnosis present

## 2020-12-23 DIAGNOSIS — Z992 Dependence on renal dialysis: Secondary | ICD-10-CM | POA: Diagnosis not present

## 2020-12-23 DIAGNOSIS — F05 Delirium due to known physiological condition: Secondary | ICD-10-CM | POA: Diagnosis present

## 2020-12-23 DIAGNOSIS — W07XXXA Fall from chair, initial encounter: Secondary | ICD-10-CM | POA: Diagnosis present

## 2020-12-23 DIAGNOSIS — G4733 Obstructive sleep apnea (adult) (pediatric): Secondary | ICD-10-CM | POA: Diagnosis present

## 2020-12-23 DIAGNOSIS — E669 Obesity, unspecified: Secondary | ICD-10-CM | POA: Diagnosis present

## 2020-12-23 DIAGNOSIS — E1122 Type 2 diabetes mellitus with diabetic chronic kidney disease: Secondary | ICD-10-CM | POA: Diagnosis present

## 2020-12-23 DIAGNOSIS — Z20822 Contact with and (suspected) exposure to covid-19: Secondary | ICD-10-CM | POA: Diagnosis present

## 2020-12-23 DIAGNOSIS — N2581 Secondary hyperparathyroidism of renal origin: Secondary | ICD-10-CM | POA: Diagnosis present

## 2020-12-23 DIAGNOSIS — J449 Chronic obstructive pulmonary disease, unspecified: Secondary | ICD-10-CM | POA: Diagnosis present

## 2020-12-23 DIAGNOSIS — I5032 Chronic diastolic (congestive) heart failure: Secondary | ICD-10-CM | POA: Diagnosis not present

## 2020-12-23 DIAGNOSIS — F0280 Dementia in other diseases classified elsewhere without behavioral disturbance: Secondary | ICD-10-CM | POA: Diagnosis present

## 2020-12-23 DIAGNOSIS — Z79899 Other long term (current) drug therapy: Secondary | ICD-10-CM | POA: Diagnosis not present

## 2020-12-23 DIAGNOSIS — Z7902 Long term (current) use of antithrombotics/antiplatelets: Secondary | ICD-10-CM | POA: Diagnosis not present

## 2020-12-23 DIAGNOSIS — I132 Hypertensive heart and chronic kidney disease with heart failure and with stage 5 chronic kidney disease, or end stage renal disease: Secondary | ICD-10-CM | POA: Diagnosis present

## 2020-12-23 DIAGNOSIS — R531 Weakness: Secondary | ICD-10-CM | POA: Diagnosis present

## 2020-12-23 DIAGNOSIS — Z794 Long term (current) use of insulin: Secondary | ICD-10-CM | POA: Diagnosis not present

## 2020-12-23 DIAGNOSIS — G9341 Metabolic encephalopathy: Secondary | ICD-10-CM | POA: Diagnosis present

## 2020-12-23 DIAGNOSIS — Z7982 Long term (current) use of aspirin: Secondary | ICD-10-CM | POA: Diagnosis not present

## 2020-12-23 DIAGNOSIS — D631 Anemia in chronic kidney disease: Secondary | ICD-10-CM | POA: Diagnosis present

## 2020-12-23 DIAGNOSIS — Y92129 Unspecified place in nursing home as the place of occurrence of the external cause: Secondary | ICD-10-CM | POA: Diagnosis not present

## 2020-12-23 DIAGNOSIS — I10 Essential (primary) hypertension: Secondary | ICD-10-CM | POA: Diagnosis not present

## 2020-12-23 DIAGNOSIS — S0003XA Contusion of scalp, initial encounter: Secondary | ICD-10-CM | POA: Diagnosis present

## 2020-12-23 DIAGNOSIS — E11319 Type 2 diabetes mellitus with unspecified diabetic retinopathy without macular edema: Secondary | ICD-10-CM | POA: Diagnosis not present

## 2020-12-23 DIAGNOSIS — G9340 Encephalopathy, unspecified: Secondary | ICD-10-CM | POA: Diagnosis not present

## 2020-12-23 DIAGNOSIS — N186 End stage renal disease: Secondary | ICD-10-CM | POA: Diagnosis not present

## 2020-12-23 DIAGNOSIS — E785 Hyperlipidemia, unspecified: Secondary | ICD-10-CM | POA: Diagnosis present

## 2020-12-23 DIAGNOSIS — G309 Alzheimer's disease, unspecified: Secondary | ICD-10-CM | POA: Diagnosis present

## 2020-12-23 LAB — MAGNESIUM: Magnesium: 2.5 mg/dL — ABNORMAL HIGH (ref 1.7–2.4)

## 2020-12-23 LAB — COMPREHENSIVE METABOLIC PANEL
ALT: 8 U/L (ref 0–44)
AST: 29 U/L (ref 15–41)
Albumin: 3.5 g/dL (ref 3.5–5.0)
Alkaline Phosphatase: 64 U/L (ref 38–126)
Anion gap: 22 — ABNORMAL HIGH (ref 5–15)
BUN: 74 mg/dL — ABNORMAL HIGH (ref 8–23)
CO2: 21 mmol/L — ABNORMAL LOW (ref 22–32)
Calcium: 9.3 mg/dL (ref 8.9–10.3)
Chloride: 93 mmol/L — ABNORMAL LOW (ref 98–111)
Creatinine, Ser: 12.97 mg/dL — ABNORMAL HIGH (ref 0.61–1.24)
GFR, Estimated: 4 mL/min — ABNORMAL LOW (ref >60)
Glucose, Bld: 85 mg/dL (ref 70–99)
Potassium: 4.1 mmol/L (ref 3.5–5.1)
Sodium: 136 mmol/L (ref 135–145)
Total Bilirubin: 0.8 mg/dL (ref 0.3–1.2)
Total Protein: 7.1 g/dL (ref 6.5–8.1)

## 2020-12-23 LAB — CBC WITH DIFFERENTIAL/PLATELET
Abs Immature Granulocytes: 0.01 10*3/uL (ref 0.00–0.07)
Basophils Absolute: 0 10*3/uL (ref 0.0–0.1)
Basophils Relative: 1 %
Eosinophils Absolute: 0.3 10*3/uL (ref 0.0–0.5)
Eosinophils Relative: 6 %
HCT: 30.7 % — ABNORMAL LOW (ref 39.0–52.0)
Hemoglobin: 10 g/dL — ABNORMAL LOW (ref 13.0–17.0)
Immature Granulocytes: 0 %
Lymphocytes Relative: 11 %
Lymphs Abs: 0.6 10*3/uL — ABNORMAL LOW (ref 0.7–4.0)
MCH: 30.6 pg (ref 26.0–34.0)
MCHC: 32.6 g/dL (ref 30.0–36.0)
MCV: 93.9 fL (ref 80.0–100.0)
Monocytes Absolute: 0.6 10*3/uL (ref 0.1–1.0)
Monocytes Relative: 11 %
Neutro Abs: 3.9 10*3/uL (ref 1.7–7.7)
Neutrophils Relative %: 71 %
Platelets: 287 10*3/uL (ref 150–400)
RBC: 3.27 MIL/uL — ABNORMAL LOW (ref 4.22–5.81)
RDW: 12.7 % (ref 11.5–15.5)
WBC: 5.4 10*3/uL (ref 4.0–10.5)
nRBC: 0 % (ref 0.0–0.2)

## 2020-12-23 LAB — VITAMIN B12: Vitamin B-12: 2395 pg/mL — ABNORMAL HIGH (ref 180–914)

## 2020-12-23 LAB — FERRITIN: Ferritin: 1142 ng/mL — ABNORMAL HIGH (ref 24–336)

## 2020-12-23 LAB — GLUCOSE, CAPILLARY
Glucose-Capillary: 91 mg/dL (ref 70–99)
Glucose-Capillary: 88 mg/dL (ref 70–99)
Glucose-Capillary: 122 mg/dL — ABNORMAL HIGH (ref 70–99)
Glucose-Capillary: 100 mg/dL — ABNORMAL HIGH (ref 70–99)

## 2020-12-23 LAB — IRON AND TIBC
Iron: 61 ug/dL (ref 45–182)
Saturation Ratios: 28 % (ref 17.9–39.5)
TIBC: 216 ug/dL — ABNORMAL LOW (ref 250–450)
UIBC: 155 ug/dL

## 2020-12-23 LAB — HEMOGLOBIN A1C
Hgb A1c MFr Bld: 5.8 % — ABNORMAL HIGH (ref 4.8–5.6)
Mean Plasma Glucose: 120 mg/dL

## 2020-12-23 LAB — FOLATE: Folate: >100 ng/mL (ref >5.9)

## 2020-12-23 LAB — PHOSPHORUS: Phosphorus: 4.7 mg/dL — ABNORMAL HIGH (ref 2.5–4.6)

## 2020-12-23 LAB — TSH: TSH: 1.554 u[IU]/mL (ref 0.350–4.500)

## 2020-12-23 MED ORDER — SODIUM CHLORIDE 0.9 % IV SOLN: 100.0000 mL | INTRAVENOUS | Status: DC | PRN

## 2020-12-23 MED ORDER — LIDOCAINE-PRILOCAINE 2.5-2.5 % EX CREA: 1.0000 "application " | TOPICAL_CREAM | CUTANEOUS | Status: DC | PRN

## 2020-12-23 MED ORDER — POLYETHYLENE GLYCOL 3350 17 G PO PACK
17.0000 g | PACK | Freq: Two times a day (BID) | ORAL | Status: DC
  Administered 2020-12-23 – 2020-12-24 (×4): 17 g via ORAL
  Filled 2020-12-23 (×4): qty 1

## 2020-12-23 MED ORDER — ALTEPLASE 2 MG IJ SOLR: 2.0000 mg | Freq: Once | INTRAMUSCULAR | Status: DC | PRN

## 2020-12-23 MED ORDER — LIDOCAINE HCL (PF) 1 % IJ SOLN: 5.0000 mL | INTRAMUSCULAR | Status: DC | PRN

## 2020-12-23 MED ORDER — PENTAFLUOROPROP-TETRAFLUOROETH EX AERO: 1.0000 "application " | INHALATION_SPRAY | CUTANEOUS | Status: DC | PRN

## 2020-12-23 MED ORDER — HEPARIN SODIUM (PORCINE) 1000 UNIT/ML DIALYSIS: 1000.0000 [IU] | INTRAMUSCULAR | Status: DC | PRN

## 2020-12-23 MED ORDER — CHLORHEXIDINE GLUCONATE CLOTH 2 % EX PADS: 6.0000 | MEDICATED_PAD | Freq: Every day | CUTANEOUS | Status: DC

## 2020-12-23 NOTE — Evaluation (Signed)
Clinical/Bedside Swallow Evaluation Patient Details  Name: Gregory Hood MRN: [id number] Date of Birth: 12-Nov-1950  Today's Date: 12/23/2020 Time: SLP Start Time (ACUTE ONLY): 45 SLP Stop Time (ACUTE ONLY): 1030 SLP Time Calculation (min) (ACUTE ONLY): 10 min  Past Medical History:  Past Medical History:  Diagnosis Date   Chronic kidney disease    COPD (chronic obstructive pulmonary disease) ([place])    Diabetes mellitus without complication ([place])    Diabetic retinopathy ([place])    Hyperlipidemia    Hypertension    Obesity    Orthostatic hypotension    OSA on CPAP    Retinal detachment    Seizures ([place])    Stroke ([place])    TIA (transient ischemic attack)    Umbilical hernia    Past Surgical History:  Past Surgical History:  Procedure Laterality Date   BASCILIC VEIN TRANSPOSITION Left 03/06/2015   Procedure: LET [place];  Surgeon: Angelia Mould, MD;  Location: [facility];  Service: Vascular;  Laterality: Left;   CIRCUMCISION     EYE SURGERY Bilateral    had surgery several different times.   HAMMER TOE SURGERY Left 30 years ago   small toe left toe   HERNIA REPAIR     IR AV DIALY SHUNT INTRO NEEDLE/INTRACATH INITIAL W/PTA/IMG LEFT  07/20/2018   SEPTOPLASTY  20 years ago   HPI:  Gregory Hood is a 70 y.o. male presenting from SNF after fall with prolonged time on floor per wife's report. Also missed 2 HD sessions. 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,W,F, DM2, chronic diastolic CHF, HTN, dementia, multiple CVA's, SSS s/p pacer, OSA on CPAP, recent hernia repair   Assessment / Plan / Recommendation Clinical Impression  Pt presents with grossly intact swallow function. He denies hx of dysphagia and states at baseline consuming regular thin liquid diet. Pt upright in chair at bedside. Assessed with thin liquids, puree, and regular POs. Some impulsivity noted with rate of PO consumption of solids. No overt s/sx of aspiration with any PO. Continue regular thin liquid diet  with meds as tolerated. Recommend intermittent supervision with pt hx of dementia and cognitive deficits. No further ST needs identified. SLP Visit Diagnosis: Dysphagia, unspecified (R13.10)    Aspiration Risk  Mild aspiration risk    Diet Recommendation   Regular thin liquids   Medication Administration: Whole meds with liquid    Other  Recommendations Oral Care Recommendations: Oral care BID   Follow up Recommendations 24 hour supervision/assistance;Skilled Nursing facility      Frequency and Duration   N/a         Prognosis        Swallow Study   General Date of Onset: 12/22/20 HPI: Gregory Hood is a 70 y.o. male presenting from SNF after fall with prolonged time on floor per wife's report. Also missed 2 HD sessions. 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,W,F, DM2, chronic diastolic CHF, HTN, dementia, multiple CVA's, SSS s/p pacer, OSA on CPAP, recent hernia repair Type of Study: Bedside Swallow Evaluation Previous Swallow Assessment: none on file Diet Prior to this Study: Regular;Thin liquids Temperature Spikes Noted: No Respiratory Status: Room air History of Recent Intubation: No Behavior/Cognition: Alert;Cooperative Oral Cavity Assessment: Within Functional Limits Oral Care Completed by SLP: No Oral Cavity - Dentition: Adequate natural dentition Vision: Functional for self-feeding Self-Feeding Abilities: Needs assist Patient Positioning: Upright in chair Baseline Vocal Quality: Normal Volitional Swallow: Able to elicit    Oral/Motor/Sensory Function Overall Oral Motor/Sensory Function:  Within functional limits   Ice Chips Ice chips: Not tested   Thin Liquid Thin Liquid: Within functional limits Presentation: Cup;Straw    Nectar Thick Nectar Thick Liquid: Not tested   Honey Thick Honey Thick Liquid: Not tested   Puree Puree: Within functional limits Presentation: Self Fed   Solid     Solid: Within functional limits Presentation: Self Fed Other Comments: impulsive  with self feeding, grossly functional     [name] H. MA, CCC-SLP Acute Rehabilitation Services   12/23/2020,10:40 AM

## 2020-12-23 NOTE — Evaluation (Signed)
Physical Therapy Evaluation Patient Details Name: Gregory Hood MRN: [id number] DOB: 1951/03/28 Today's Date: 12/23/2020   History of Present Illness  Gregory Hood is a 70 y.o. male presenting from SNF after fall with prolonged time on floor per wife's report. Also missed 2 HD sessions. 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  Clinical Impression  Pt admitted with above diagnosis. Pt presents with delayed and sometimes absent responses to questions. Unclear if this is his baseline. He reports that he ambulates to bathroom with RW and gets in w/c on his own at [facility]. No family present to verify this. Have concerns about return to prior SNF if he truly laid on the floor for hours. Today pt required min A to stand from elevated bed and mod A +2 to pivot to recliner. Pt was unable to effectively step feet in standing to progress to ambulation.   Pt currently with functional limitations due to the deficits listed below (see PT Problem List). Pt will benefit from skilled PT to increase their independence and safety with mobility to allow discharge to the venue listed below.      Follow Up Recommendations SNF;Supervision/Assistance - 24 hour    Equipment Recommendations  None recommended by PT    Recommendations for Other Services       Precautions / Restrictions Precautions Precautions: Fall Restrictions Weight Bearing Restrictions: No      Mobility  Bed Mobility Overal bed mobility: Needs Assistance Bed Mobility: Rolling;Sidelying to Sit Rolling: Supervision Sidelying to sit: Min guard       General bed mobility comments: pt able to roll to L with vc's and use of rail, vc's for LE's off bed and increased time to raise trunk into sitting. Posterior lean noted EOB until pt cued to scoot fwd and get feet on floor    Transfers Overall transfer level: Needs assistance Equipment used:  Rolling walker (2 wheeled) Transfers: Sit to/from [facility] Sit to Stand: Min assist;+2 safety/equipment Stand pivot transfers: Mod assist;+2 safety/equipment       General transfer comment: pt stood from elevated bed with min A to stand and use urinal. Had difficulty maintaining standing for length of time needed to use urinal. Pt performed SPT to chair with mod A with therapist in front of pt and pt did not achieve full standing  Ambulation/Gait             General Gait Details: unable to step feet safely in standing today  Stairs            Wheelchair Mobility    Modified Rankin (Stroke Patients Only)       Balance Overall balance assessment: History of Falls;Needs assistance Sitting-balance support: Feet supported;Bilateral upper extremity supported Sitting balance-Leahy Scale: Poor Sitting balance - Comments: posterior lean, close supervision EOB Postural control: Posterior lean Standing balance support: Bilateral upper extremity supported Standing balance-Leahy Scale: Poor Standing balance comment: needs UE support and external support                             Pertinent Vitals/Pain Pain Assessment: Faces Faces Pain Scale: Hurts a little bit Pain Location: lower abdomen Pain Descriptors / Indicators: Sore Pain Intervention(s): Limited activity within patient's tolerance;Monitored during session    Home Living Family/patient expects to be discharged to:: Skilled nursing facility  Additional Comments: [facility] in [place]    Prior Function Level of Independence: Needs assistance   Gait / Transfers Assistance Needed: pt reports that he ambulates into bathroom with RW but otherise uses w/c, pt is poor historian though so actual function uncertain  ADL's / Homemaking Assistance Needed: pt reports that he dresses himself  Comments: no family present and pt often says he can't remember     Hand  Dominance   Dominant Hand: Right    Extremity/Trunk Assessment   Upper Extremity Assessment Upper Extremity Assessment: Generalized weakness    Lower Extremity Assessment Lower Extremity Assessment: Generalized weakness;RLE deficits/detail RLE Deficits / Details: pt tests 4/5 on MMT B LE's but functionally demos decreased strength with posterior lean against bed in standing, flexed knees, and poor tolerance for OOB activity    Cervical / Trunk Assessment Cervical / Trunk Assessment: Kyphotic  Communication   Communication: Expressive difficulties  Cognition Arousal/Alertness: Awake/alert Behavior During Therapy: Flat affect Overall Cognitive Status: No family/caregiver present to determine baseline cognitive functioning                                 General Comments: pt with dementia at baseline, unsure how close he is to his baseline today. Made minimal eye contact and sometimes did not respond. Unclear whether this is his dementia or frustration with having to be in hospital      General Comments General comments (skin integrity, edema, etc.): Pt blind in L eye    Exercises General Exercises - Lower Extremity Ankle Circles/Pumps: AROM;Both;20 reps;Seated   Assessment/Plan    PT Assessment Patient needs continued PT services  PT Problem List Decreased strength;Decreased activity tolerance;Decreased balance;Decreased mobility;Decreased range of motion;Decreased cognition;Decreased knowledge of use of DME;Decreased knowledge of precautions;Decreased safety awareness;Pain       PT Treatment Interventions DME instruction;Gait training;Functional mobility training;Therapeutic activities;Therapeutic exercise;Balance training;Neuromuscular re-education;Cognitive remediation;Patient/family education    PT Goals (Current goals can be found in the Care Plan section)  Acute Rehab PT Goals Patient Stated Goal: leave hospital PT Goal Formulation: With patient Time  For Goal Achievement: 01/06/21 Potential to Achieve Goals: Fair    Frequency Min 2X/week   Barriers to discharge        Co-evaluation               AM-PAC PT "6 Clicks" Mobility  Outcome Measure Help needed turning from your back to your side while in a flat bed without using bedrails?: None Help needed moving from lying on your back to sitting on the side of a flat bed without using bedrails?: A Little Help needed moving to and from a bed to a chair (including a wheelchair)?: A Lot Help needed standing up from a chair using your arms (e.g., wheelchair or bedside chair)?: A Lot Help needed to walk in hospital room?: Total Help needed climbing 3-5 steps with a railing? : Total 6 Click Score: 13    End of Session   Activity Tolerance: Patient tolerated treatment well Patient left: in chair;with call bell/phone within reach;with chair alarm set Nurse Communication: Mobility status PT Visit Diagnosis: Muscle weakness (generalized) (M62.81);History of falling (Z91.81);Unsteadiness on feet (R26.81)    Time: [id number] PT Time Calculation (min) (ACUTE ONLY): 29 min   Charges:   PT Evaluation $PT Eval Moderate Complexity: 1 Mod PT Treatments $Therapeutic Activity: 8-22 mins        [place] Odilia Damico, PT  Acute  Rehab Services  Pager [phone] Office [place] 12/23/2020, 9:29 AM

## 2020-12-23 NOTE — Progress Notes (Signed)
Pt is off the unit at dialysis, unable to assess.

## 2020-12-23 NOTE — Plan of Care (Signed)

## 2020-12-23 NOTE — Evaluation (Signed)
Occupational Therapy Evaluation Patient Details Name: Gregory Hood MRN: [id number] DOB: October 03, 1950 Today's Date: 12/23/2020    History of Present Illness Gregory Hood is a 70 y.o. male presenting from SNF after fall with prolonged time on floor per wife's report. Also missed 2 HD sessions. 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   Clinical Impression   Pt admitted from facility with above diagnosis, requiring some encouragement for participation, no acute focal deficits observed during session, but pt does present with decreased activity tolerance, balance, and strength with ?insight to safety and cognition noted with dementia at baseline. Declining participation in SBT this date. Pt demo's need for mod A for LB ADL's, mod A +1 for transition to standing from recliner and mod+2 for pivot with RW with decreased sequencing/initiation and processing of directions. At this time, anticipate pt to continue to require 24hr S/A and post acute therapy but unclear of plan is to return to facility where fall with prolonged time on floor occurred. OT will continue to follow acutely.     Follow Up Recommendations  SNF;Supervision/Assistance - 24 hour    Equipment Recommendations   (TBD)    Recommendations for Other Services       Precautions / Restrictions Precautions Precautions: Fall Precaution Comments: dementia, L eye blind Restrictions Weight Bearing Restrictions: No      Mobility Bed Mobility Overal bed mobility: Needs Assistance Bed Mobility: Rolling;Sidelying to Sit Rolling: Supervision Sidelying to sit: Min guard       General bed mobility comments: pt able to roll to L with vc's and use of rail, vc's for LE's off bed and increased time to raise trunk into sitting. Posterior lean noted EOB until pt cued to scoot fwd and get feet on floor    Transfers Overall transfer level:  Needs assistance Equipment used: Rolling walker (2 wheeled) Transfers: Sit to/from [facility] Sit to Stand: Mod assist Stand pivot transfers: Mod assist;+2 physical assistance       General transfer comment: STS from recliner with Mod +1 to achieve standing with support on RW; increased to mod +2 for pivot transfers limited by processing/sequencing and motor planning.    Balance Overall balance assessment: History of Falls;Needs assistance Sitting-balance support: Feet supported;Bilateral upper extremity supported Sitting balance-Leahy Scale: Poor Sitting balance - Comments: posterior lean, close supervision EOB Postural control: Posterior lean Standing balance support: Bilateral upper extremity supported Standing balance-Leahy Scale: Poor Standing balance comment: needs UE support and external support                           ADL either performed or assessed with clinical judgement   ADL Overall ADL's : Needs assistance/impaired Eating/Feeding: Set up;Sitting   Grooming: Oral care;Set up;Sitting   Upper Body Bathing: Minimal assistance;Sitting   Lower Body Bathing: Moderate assistance;Cueing for safety Lower Body Bathing Details (indicate cue type and reason): seated     Lower Body Dressing: Moderate assistance Lower Body Dressing Details (indicate cue type and reason): seated Toilet Transfer: Moderate assistance;+2 for physical assistance;RW                   Vision         Perception     Praxis      Pertinent Vitals/Pain Pain Assessment: No/denies pain Faces Pain Scale: Hurts a little bit Pain Location: lower  abdomen Pain Descriptors / Indicators: Sore Pain Intervention(s): Limited activity within patient's tolerance;Monitored during session     Hand Dominance Right   Extremity/Trunk Assessment Upper Extremity Assessment Upper Extremity Assessment: Generalized weakness   Lower Extremity Assessment Lower Extremity  Assessment: Defer to PT evaluation RLE Deficits / Details: pt tests 4/5 on MMT B LE's but functionally demos decreased strength with posterior lean against bed in standing, flexed knees, and poor tolerance for OOB activity   Cervical / Trunk Assessment Cervical / Trunk Assessment: Kyphotic   Communication Communication Communication: Expressive difficulties   Cognition Arousal/Alertness: Awake/alert Behavior During Therapy: Flat affect Overall Cognitive Status: No family/caregiver present to determine baseline cognitive functioning                                 General Comments: noted in chart pt with dementia; decreased safety awareness and insight to any deficits present. unclear of cognitive status declining completino of SBT.   General Comments  Pt blind in L eye    Exercises Exercises: General Lower Extremity General Exercises - Lower Extremity Ankle Circles/Pumps: AROM;Both;20 reps;Seated   Shoulder Instructions      Home Living Family/patient expects to be discharged to:: Skilled nursing facility                                 Additional Comments: [facility] in [place]      Prior Functioning/Environment Level of Independence: Needs assistance  Gait / Transfers Assistance Needed: pt reporting both use of RW and w/c. difficulty with determining primary means of mobility ADL's / Homemaking Assistance Needed: pt reports that he dresses himself Communication / Swallowing Assistance Needed: slowed speech production and response. Comments: no family ro support system present to confirm social and PLOF/baseline        OT Problem List: Decreased activity tolerance;Impaired balance (sitting and/or standing);Decreased safety awareness;Decreased knowledge of use of DME or AE;Decreased knowledge of precautions      OT Treatment/Interventions: Self-care/ADL training;Therapeutic exercise;Neuromuscular education;DME and/or AE  instruction;Therapeutic activities;Patient/family education    OT Goals(Current goals can be found in the care plan section) Acute Rehab OT Goals Patient Stated Goal: to go back to [place] OT Goal Formulation: With patient Time For Goal Achievement: 01/06/21 Potential to Achieve Goals: Fair ADL Goals Pt Will Perform Upper Body Dressing: sitting;with set-up Pt Will Perform Lower Body Dressing: with min assist;with adaptive equipment Pt Will Transfer to Toilet: with min assist;bedside commode Pt Will Perform Toileting - Clothing Manipulation and hygiene: with min assist;sit to/from stand  OT Frequency: Min 2X/week   Barriers to D/C:            Co-evaluation              AM-PAC OT "6 Clicks" Daily Activity     Outcome Measure Help from another person eating meals?: A Little Help from another person taking care of personal grooming?: A Little Help from another person toileting, which includes using toliet, bedpan, or urinal?: A Lot Help from another person bathing (including washing, rinsing, drying)?: A Lot Help from another person to put on and taking off regular upper body clothing?: A Little Help from another person to put on and taking off regular lower body clothing?: A Lot 6 Click Score: 15   End of Session Equipment Utilized During Treatment: Gait belt;Rolling walker Nurse Communication: Mobility status  Activity Tolerance: Patient tolerated treatment well Patient left: in chair;with chair alarm set;with call bell/phone within reach  OT Visit Diagnosis: Unsteadiness on feet (R26.81);History of falling (Z91.81)                Time: [id number] OT Time Calculation (min): 12 min Charges:  OT General Charges $OT Visit: 1 Visit OT Evaluation $OT Eval Low Complexity: 1 Low  Cottrell Gentles OTR/L acute rehab services Office: [phone]  12/23/2020, 12:25 PM

## 2020-12-23 NOTE — Consult Note (Signed)
[facility] Renal Consultation Note    Indication for Consultation:  Management of ESRD/hemodialysis; anemia, hypertension/volume and secondary hyperparathyroidism  [id number], Carloyn Manner, MD  HPI: Gregory Hood is a 70 y.o. male with ESRD on HD MWF at [facility]. His past medical history significant for DM2, chronic diastolic CHF, HTN, dementia, multiple prior strokes, and SSS s/p pacemaker. Patient brought into the ER by his wife after sustaining a fall at the SNF. Patient reports slipping out of his chair. He tried to get up but didn't have the strength. Reviewed OP HD records. His last HD tx was on 12/17/20. Spoke to wife via phone. She reports patient missing treatments d/t "going through a lot" and feeling washed out. She also reported patient recently underwent L inguinal hernia repair earlier this month. Seen and examined at bedside this morning. Denied SOB, CP, palpitations, ABD pain, and N/V/D. Lab work include: K+ 4.1, Ca 9.3, BUN 74, SrCr 12.97, Hgb 10.0. CXR unremarkable. Plan for HD today.  Past Medical History:  Diagnosis Date   Chronic kidney disease    COPD (chronic obstructive pulmonary disease) ([place])    Diabetes mellitus without complication (HCC)    Diabetic retinopathy ([place])    Hyperlipidemia    Hypertension    Obesity    Orthostatic hypotension    OSA on CPAP    Retinal detachment    Seizures ([place])    Stroke ([place])    TIA (transient ischemic attack)    Umbilical hernia    Past Surgical History:  Procedure Laterality Date   BASCILIC VEIN TRANSPOSITION Left 03/06/2015   Procedure: LET [place];  Surgeon: Angelia Mould, MD;  Location: [facility] OR;  Service: Vascular;  Laterality: Left;   CIRCUMCISION     EYE SURGERY Bilateral    had surgery several different times.   HAMMER TOE SURGERY Left 30 years ago   small toe left toe   HERNIA REPAIR     IR AV DIALY SHUNT INTRO NEEDLE/INTRACATH INITIAL W/PTA/IMG LEFT  07/20/2018    SEPTOPLASTY  20 years ago   Family History  Problem Relation Age of Onset   Hypertension Mother    Diabetes type II Mother    Transient ischemic attack Mother    Kidney disease Mother    Diabetes Mother    Varicose Veins Mother    Hypertension Father    Diabetes type II Father    Diabetes Father    Diabetes type II Sister    Hypertension Sister    Diabetes Sister    Learning disabilities Maternal Uncle    Social History:  reports that he quit smoking about 46 years ago. His smoking use included cigarettes. He has never used smokeless tobacco. He reports that he does not drink alcohol and does not use drugs. Allergies  Allergen Reactions   Bee Venom Swelling   Lexiscan [Regadenoson] Other (See Comments)    Seizure like-activity after lexiscan given.   Statins     Other reaction(s): Other (See Comments) Myalgias and CK increase.  Tried both atorvastatin and rosuvastatin    Ace Inhibitors Cough   Prior to Admission medications   Medication Sig Start Date End Date Taking? Authorizing Provider  acetaminophen (TYLENOL) 500 MG tablet Take 500 mg by mouth daily.   Yes [provider]  aspirin 81 MG tablet Take 1 tablet (81 mg total) by mouth daily. Patient taking differently: Take 81 mg by mouth at bedtime. 03/30/18  Yes Hongalgi, Lenis Dickinson, MD  bisacodyl (DULCOLAX) 10 MG suppository Place 10 mg rectally as needed for moderate constipation.   Yes [provider]  calcium acetate (PHOSLO) 667 MG capsule Take 667 mg by mouth 3 (three) times daily. 08/29/20  Yes [provider]  carvedilol (COREG) 3.125 MG tablet Take 1 tablet (3.125 mg total) by mouth 2 (two) times daily with a meal. Take after dialysis on dialysis days 10/11/20  Yes Debbe Odea, MD  cholecalciferol 2000 units TABS Take 1 tablet (2,000 Units total) by mouth daily. Patient taking differently: Take 1,000 Units by mouth daily. 03/31/18  Yes Hongalgi, Lenis Dickinson, MD  clopidogrel (PLAVIX) 75 MG tablet Take  75 mg by mouth daily. 03/10/18  Yes [provider]  cyanocobalamin (,VITAMIN B-12,) 1000 MCG/ML injection Inject 1,000 mcg into the muscle every 30 (thirty) days. On the 26th   Yes [provider]  Dextrose, Diabetic Use, (GLUCOSE) 77.4 % GEL Take 1 Dose by mouth as needed (blood glucose less than 70).   Yes [provider]  EPINEPHrine 0.3 mg/0.3 mL IJ SOAJ injection Inject 0.3 mg into the muscle once as needed for anaphylaxis. 10/12/20  Yes [provider]  ezetimibe (ZETIA) 10 MG tablet Take 10 mg by mouth every evening. 04/22/19  Yes [provider]  finasteride (PROSCAR) 5 MG tablet Take 5 mg by mouth daily. 03/26/19  Yes [provider]  folic acid (FOLVITE) 1 MG tablet Take 2 tablets (2 mg total) by mouth daily. 04/03/14  Yes Josue Hector, MD  Glucagon, rDNA, (GLUCAGON EMERGENCY IJ) Inject 1 mg into the muscle as needed (blood sugar less than 70).   Yes [provider]  Insulin Aspart FlexPen 100 UNIT/ML SOPN Inject 2-10 Units into the skin See admin instructions. Bid per sliding scale Less than 150= 0 units, 151-200= 2 units, 201-250=4 units, 251-300= 6 units, 301-350= 8 units, 351-400= 10 units, if less than 70 or over 401 call md 12/16/20  Yes [provider]  memantine (NAMENDA) 10 MG tablet Take 10 mg by mouth 2 (two) times daily.   Yes [provider]  multivitamin (RENA-VIT) TABS tablet Take 1 tablet by mouth daily.   Yes [provider]  niacin 100 MG tablet Take 200 mg by mouth every evening.   Yes [provider]  ondansetron (ZOFRAN-ODT) 4 MG disintegrating tablet Take 4 mg by mouth every 4 (four) hours as needed for nausea or vomiting.   Yes [provider]  OVER THE COUNTER MEDICATION Take 1 Dose by mouth every evening. Yogurt for weight loss   Yes [provider]  polyethylene glycol (MIRALAX / GLYCOLAX) packet Take 17 g by mouth 2 (two) times daily. Patient taking  differently: Take 17 g by mouth daily as needed for mild constipation. 03/30/18  Yes Hongalgi, Lenis Dickinson, MD  prednisoLONE acetate (PRED FORTE) 1 % ophthalmic suspension Place 1 drop into the left eye at bedtime. Patient taking differently: Place 1 drop into the left eye 4 (four) times daily. 07/18/18  Yes Kc, Maren Beach, MD  Red Yeast Rice 600 MG CAPS Take 1,200 mg by mouth at bedtime.   Yes [provider]  senna-docusate (SENOKOT-S) 8.6-50 MG tablet Take 2 tablets by mouth daily. Patient taking differently: Take 1 tablet by mouth 2 (two) times daily. 07/19/18  Yes Antonieta Pert, MD  Sodium Phosphates (FLEET ENEMA RE) Place 1 Dose rectally as needed (constipation).   Yes [provider]  ULTRAM 50 MG tablet Take 25 mg by  mouth every 12 (twelve) hours as needed (HTN x 5 days). 12/16/20  Yes [provider]  Nutritional Supplements (,FEEDING SUPPLEMENT, PROSOURCE PLUS) liquid Take 30 mLs by mouth 2 (two) times daily between meals. Patient not taking: Reported on 12/22/2020 10/11/20   Debbe Odea, MD  Nutritional Supplements (FEEDING SUPPLEMENT, NEPRO CARB STEADY,) LIQD Take 237 mLs by mouth 2 (two) times daily between meals. Patient not taking: Reported on 12/22/2020 10/11/20   Debbe Odea, MD   Current Facility-Administered Medications  Medication Dose Route Frequency Provider Last Rate Last Admin   0.9 %  sodium chloride infusion  250 mL Intravenous PRN Doutova, Anastassia, MD       0.9 %  sodium chloride infusion  100 mL Intravenous PRN Adelfa Koh, NP       0.9 %  sodium chloride infusion  100 mL Intravenous PRN Adelfa Koh, NP       acetaminophen (TYLENOL) tablet 650 mg  650 mg Oral Q6H PRN Toy Baker, MD       Or   acetaminophen (TYLENOL) suppository 650 mg  650 mg Rectal Q6H PRN Doutova, Anastassia, MD       alteplase (CATHFLO ACTIVASE) injection 2 mg  2 mg Intracatheter Once PRN Adelfa Koh, NP       bisacodyl (DULCOLAX) suppository 10  mg  10 mg Rectal Daily PRN Heloise Purpura, RPH       calcium acetate (PHOSLO) capsule 667 mg  667 mg Oral TID Toy Baker, MD   667 mg at 12/23/20 1024   carvedilol (COREG) tablet 3.125 mg  3.125 mg Oral BID WC Toy Baker, MD   3.125 mg at 12/23/20 [id number]   Chlorhexidine Gluconate Cloth 2 % PADS 6 each  6 each Topical Q0600 Adelfa Koh, NP       clopidogrel (PLAVIX) tablet 75 mg  75 mg Oral Daily Toy Baker, MD   75 mg at 12/23/20 1023   ezetimibe (ZETIA) tablet 10 mg  10 mg Oral QPM Doutova, Nyoka Lint, MD   10 mg at 12/22/20 2352   finasteride (PROSCAR) tablet 5 mg  5 mg Oral Daily Doutova, Nyoka Lint, MD   5 mg at 12/23/20 1024   heparin injection 1,000 Units  1,000 Units Dialysis PRN Adelfa Koh, NP       HYDROcodone-acetaminophen (NORCO/VICODIN) 5-325 MG per tablet 1-2 tablet  1-2 tablet Oral Q4H PRN Toy Baker, MD       insulin aspart (novoLOG) injection 0-6 Units  0-6 Units Subcutaneous Q4H Doutova, Anastassia, MD       lidocaine (PF) (XYLOCAINE) 1 % injection 5 mL  5 mL Intradermal PRN Adelfa Koh, NP       lidocaine-prilocaine (EMLA) cream 1 application  1 application Topical PRN Adelfa Koh, NP       memantine [facility]) tablet 10 mg  10 mg Oral BID Toy Baker, MD   10 mg at 12/23/20 1024   pentafluoroprop-tetrafluoroeth (GEBAUERS) aerosol 1 application  1 application Topical PRN Adelfa Koh, NP       polyethylene glycol (MIRALAX / GLYCOLAX) packet 17 g  17 g Oral BID Doutova, Anastassia, MD   17 g at 12/23/20 1024   sodium chloride flush (NS) 0.9 % injection 3 mL  3 mL Intravenous Q12H Doutova, Anastassia, MD   3 mL at 12/23/20 1029   sodium chloride flush (NS) 0.9 % injection 3 mL  3 mL Intravenous PRN Toy Baker, MD  traMADol (ULTRAM) tablet 25 mg  25 mg Oral Q12H PRN Toy Baker, MD       Labs: Basic Metabolic Panel: Recent Labs  Lab 12/22/20 1107 12/23/20 1005  NA  136 136  K 4.0 4.1  CL 95* 93*  CO2 26 21*  GLUCOSE 107* 85  BUN 69* 74*  CREATININE 12.37* 12.97*  CALCIUM 9.1 9.3  PHOS  --  4.7*   Liver Function Tests: Recent Labs  Lab 12/22/20 1107 12/23/20 1005  AST 30 29  ALT 11 8  ALKPHOS 59 64  BILITOT 0.7 0.8  PROT 7.3 7.1  ALBUMIN 3.5 3.5   No results for input(s): LIPASE, AMYLASE in the last 168 hours. Recent Labs  Lab 12/22/20 1239  AMMONIA 22   CBC: Recent Labs  Lab 12/22/20 1107 12/23/20 1005  WBC 5.1 5.4  NEUTROABS  --  3.9  HGB 9.9* 10.0*  HCT 31.3* 30.7*  MCV 96.3 93.9  PLT 260 287   Cardiac Enzymes: Recent Labs  Lab 12/22/20 1239  CKTOTAL 227   CBG: Recent Labs  Lab 12/22/20 2333 12/23/20 0411 12/23/20 0808 12/23/20 1129  GLUCAP 129* 91 88 122*   Iron Studies:  Recent Labs    12/22/20 1911  IRON 61  TIBC 216*  FERRITIN 1,142*   Studies/Results: DG Chest 1 View  Result Date: 12/22/2020 CLINICAL DATA:  70 year old male with history of diabetes and stroke. EXAM: CHEST  1 VIEW COMPARISON:  Chest radiograph dated 10/08/2020 FINDINGS: No focal consolidation, pleural effusion, pneumothorax. Mild cardiomegaly. Atherosclerotic calcification of the aorta. Right pectoral pacemaker device. No acute osseous pathology. IMPRESSION: No acute cardiopulmonary process. Electronically Signed   By: Anner Crete M.D.   On: 12/22/2020 21:26   DG Abdomen 1 View  Result Date: 12/22/2020 CLINICAL DATA:  70 year old male with constipation. EXAM: ABDOMEN - 1 VIEW COMPARISON:  Radiograph dated 04/21/2016. FINDINGS: There is large amount of stool throughout the colon. No bowel dilatation or evidence of obstruction. No free air or radiopaque calculi. Osteopenia with degenerative changes of the spine. No acute osseous pathology. IMPRESSION: Constipation. No bowel obstruction. Electronically Signed   By: Anner Crete M.D.   On: 12/22/2020 21:27   CT HEAD WO CONTRAST  Result Date: 12/22/2020 CLINICAL DATA:  Altered  mental status EXAM: CT HEAD WITHOUT CONTRAST TECHNIQUE: Contiguous axial images were obtained from the base of the skull through the vertex without intravenous contrast. COMPARISON:  10/08/2020 FINDINGS: Brain: Diffuse atrophic changes are identified with chronic white matter ischemic change. Findings of prior infarct in the cerebellar hemispheres bilaterally stable in appearance from the prior exam. Small lacunar infarcts are noted within the thalami bilaterally. Basal ganglia calcifications are noted. Vascular: No hyperdense vessel or unexpected calcification. Skull: Normal. Negative for fracture or focal lesion. Sinuses/Orbits: No acute finding. Other: None IMPRESSION: Chronic atrophic and ischemic changes without acute abnormality. Electronically Signed   By: Inez Catalina M.D.   On: 12/22/2020 14:05    Vitals:   12/22/20 2243 12/23/20 0412 12/23/20 0800 12/23/20 1100  BP: (!) 160/78 (!) 154/96 (!) 164/84 (!) 178/89  Pulse: 72 71 71 69  Resp: '17 16 16 16  '$ Temp: 98.8 F (37.1 C) 98.2 F (36.8 C) 98.2 F (36.8 C) 98.1 F (36.7 C)  TempSrc: Oral Oral Oral Oral  SpO2: 100% 100% 100% 100%  Weight: 89.4 kg      Physical Exam General: WDWN NAD Head: NCAT sclera not icteric MMM Lungs: CTA bilaterally. No wheeze, rales or rhonchi.  Breathing is unlabored. Heart: RRR. No murmur, rubs or gallops. Noted R pacemaker Abdomen: soft, nontender, +BS, no guarding, no rebound tenderness Lower extremities:no edema, ischemic changes, or open wounds  Neuro: Unable to recall why in hospital; Alert to self; Answers simple questions appropriately; Moves all extremities spontaneously. Dialysis Access: L AVG  Dialysis Orders:  MWF - [facility]  3hrs/42mn, BFR 450, DFR 600,  EDW90kg, 2K/ 2Ca  Access: L AVG  Calcitriol 1.722m PO 3X/weekly with HD-last dose 12/03/20 Venofer '50mg'$  IV weekly-last dose 11/05/20 (medication recently resumed 12/24/20) Calcium Acetate '667mg'$  1 tab TID with meals  Last Labs:  Hgb 10.0, K 4.1, Ca 9.3, P 4.7,  Alb 3.5  Assessment/Plan:  Acute encephalopathy: Patient with dementia; last HD on 12/17/20-plan for HD today-CXR (-).  ESRD - on HD MWF. Missed two treatments. Spoke to patient's wife who informed me that he feels "washed out" after OP HD. Apparently he experiences nausea and vomiting with intermittent hypotension. Was inquiring whether he can receive HD 2 days/week. I discussed with her the importance on coming to required HD treatments to appropriately clean his blood to reach clearance goal. This will be addressed further in outpatient. Plan for HD today.  Hypertension/volume  - Blood pressures elevated-missed two txs-hopeful this can improve once we get his HD back on track. Continue Carvedilol.  Anemia of CKD - Hgb now 10.0-appears ESA held back in May for Hgb 12-will monitor trend;may need to resume here.  Secondary Hyperparathyroidism -  Ca and PO4 at goal-continue binder  Nutrition - Renal diet with fluid restriction-protein supplement ordered.  CoTobie PoetNP [facility] 12/23/2020, 3:54 PM

## 2020-12-23 NOTE — Progress Notes (Signed)
PROGRESS NOTE  Gregory Hood [id number] DOB: 1950/07/05 DOA: 12/22/2020 PCP: Jilda Panda, MD  HPI/Recap of past 10 hours: 70 year old male with past medical history of end-stage renal disease on hemodialysis Monday Wednesday Friday, diabetes mellitus type 2, dementia and previous CVA as well as chronic diastolic heart failure last received dialysis on Wednesday, 6/21 and unclear why he missed dialysis on Friday or Monday, but was brought in by family on 6/27 after fall and increased confusion.  In the emergency room, noted to have elevated blood pressures with systolic in the [id number] and creatinine of 12, but no electrolyte abnormalities.  Patient was awake albeit somewhat confused.  Brought into the hospital service for further evaluation.  CT scan of head negative.  Today, patient is fatigued, although with prompting, he is alert and oriented x4.  He initially thinks it is in the 90s, but self corrects and says that it is 2022 and he knows who the president is.  Seen by nephrology who plan to dialyze although not emergently.  Patient himself complains of a mild headache and is tired  Assessment/Plan: Active Problems:   Essential hypertension, benign: Elevated blood pressure secondary to lack of dialysis.  Should improve following dialysis.    DM (diabetes mellitus), type 2 with renal complications ([place]): CBG stable, continue meds plus sliding scale.    Chronic diastolic heart failure ([place]): Stable, volume is managed with dialysis.    End stage renal disease [facility]): Nephrology following.  Dialysis, possibly later today or tomorrow   OSA (obstructive sleep apnea): Stable   Normocytic anemia   Acute encephalopathy: Some mild, but given appropriate answers, likely at baseline now   Code Status: Full code  Family Communication: Left message for family  Disposition Plan: Back to skilled nursing once dialyzed   Consultants: Nephrology  Procedures: Plan  dialysis  Antimicrobials: None  DVT prophylaxis: SCDs  Level of care: Telemetry Medical   Objective: Vitals:   12/23/20 0800 12/23/20 1100  BP: (!) 164/84 (!) 178/89  Pulse: 71 69  Resp: 16 16  Temp: 98.2 F (36.8 C) 98.1 F (36.7 C)  SpO2: 100% 100%    Intake/Output Summary (Last 24 hours) at 12/23/2020 1612 Last data filed at 12/23/2020 0900 Gross per 24 hour  Intake 103 ml  Output 250 ml  Net -147 ml   Filed Weights   12/22/20 2243  Weight: 89.4 kg   Body mass index is 28.28 kg/m.  Exam:  General: Alert and oriented x3 with prompting, no acute distress Cardiovascular: Regular rate and rhythm, S1-S2 Respiratory: Clear to auscultation bilaterally Abdomen: Soft, nontender, nondistended, hypoactive bowel sounds Musculoskeletal: No clubbing or cyanosis, 1-2+ pitting edema Skin: Dry skin Psychiatry: Appropriate, no evidence of psychoses   Data Reviewed: CBC: Recent Labs  Lab 12/22/20 1107 12/23/20 1005  WBC 5.1 5.4  NEUTROABS  --  3.9  HGB 9.9* 10.0*  HCT 31.3* 30.7*  MCV 96.3 93.9  PLT 260 [id number]   Basic Metabolic Panel: Recent Labs  Lab 12/22/20 1107 12/23/20 1005  NA 136 136  K 4.0 4.1  CL 95* 93*  CO2 26 21*  GLUCOSE 107* 85  BUN 69* 74*  CREATININE 12.37* 12.97*  CALCIUM 9.1 9.3  MG  --  2.5*  PHOS  --  4.7*   GFR: Estimated Creatinine Clearance: 6.1 mL/min (A) (by C-G formula based on SCr of 12.97 mg/dL (H)). Liver Function Tests: Recent Labs  Lab 12/22/20 1107 12/23/20 1005  AST 30 29  ALT  11 8  ALKPHOS 59 64  BILITOT 0.7 0.8  PROT 7.3 7.1  ALBUMIN 3.5 3.5   No results for input(s): LIPASE, AMYLASE in the last 168 hours. Recent Labs  Lab 12/22/20 1239  AMMONIA 22   Coagulation Profile: No results for input(s): INR, PROTIME in the last 168 hours. Cardiac Enzymes: Recent Labs  Lab 12/22/20 1239  CKTOTAL 227   BNP (last 3 results) No results for input(s): PROBNP in the last 8760 hours. HbA1C: No results for  input(s): HGBA1C in the last 72 hours. CBG: Recent Labs  Lab 12/22/20 2333 12/23/20 0411 12/23/20 0808 12/23/20 1129  GLUCAP 129* 91 88 122*   Lipid Profile: No results for input(s): CHOL, HDL, LDLCALC, TRIG, CHOLHDL, LDLDIRECT in the last 72 hours. Thyroid Function Tests: Recent Labs    12/23/20 1005  TSH 1.554   Anemia Panel: Recent Labs    12/22/20 1911  VITAMINB12 2,395*  FOLATE >100.0  FERRITIN 1,142*  TIBC 216*  IRON 61  RETICCTPCT 1.3   Urine analysis:    Component Value Date/Time   COLORURINE YELLOW 12/22/2020 1645   APPEARANCEUR CLEAR 12/22/2020 1645   LABSPEC 1.014 12/22/2020 1645   PHURINE 7.0 12/22/2020 1645   GLUCOSEU NEGATIVE 12/22/2020 1645   HGBUR SMALL (A) 12/22/2020 1645   BILIRUBINUR NEGATIVE 12/22/2020 1645   KETONESUR NEGATIVE 12/22/2020 1645   PROTEINUR 100 (A) 12/22/2020 1645   UROBILINOGEN 0.2 09/24/2014 0216   NITRITE NEGATIVE 12/22/2020 1645   LEUKOCYTESUR NEGATIVE 12/22/2020 1645   Sepsis Labs: '@LABRCNTIP'$ (procalcitonin:4,lacticidven:4)  ) Recent Results (from the past 240 hour(s))  Resp Panel by RT-PCR (Flu A&B, Covid) Nasopharyngeal Swab     Status: None   Collection Time: 12/22/20  7:01 PM   Specimen: Nasopharyngeal Swab; Nasopharyngeal(NP) swabs in vial transport medium  Result Value Ref Range Status   SARS Coronavirus 2 by RT PCR NEGATIVE NEGATIVE Final    Comment: (NOTE) SARS-CoV-2 target nucleic acids are NOT DETECTED.  The SARS-CoV-2 RNA is generally detectable in upper respiratory specimens during the acute phase of infection. The lowest concentration of SARS-CoV-2 viral copies this assay can detect is 138 copies/mL. A negative result does not preclude SARS-Cov-2 infection and should not be used as the sole basis for treatment or other patient management decisions. A negative result may occur with  improper specimen collection/handling, submission of specimen other than nasopharyngeal swab, presence of viral  mutation(s) within the areas targeted by this assay, and inadequate number of viral copies(<138 copies/mL). A negative result must be combined with clinical observations, patient history, and epidemiological information. The expected result is Negative.  Fact Sheet for Patients:  EntrepreneurPulse.com.au  Fact Sheet for Healthcare Providers:  IncredibleEmployment.be  This test is no t yet approved or cleared by the [place] FDA and  has been authorized for detection and/or diagnosis of SARS-CoV-2 by FDA under an Emergency Use Authorization (EUA). This EUA will remain  in effect (meaning this test can be used) for the duration of the COVID-19 declaration under Section 564(b)(1) of the Act, 21 U.S.C.section 360bbb-3(b)(1), unless the authorization is terminated  or revoked sooner.       Influenza A by PCR NEGATIVE NEGATIVE Final   Influenza B by PCR NEGATIVE NEGATIVE Final    Comment: (NOTE) The Xpert Xpress SARS-CoV-2/FLU/RSV plus assay is intended as an aid in the diagnosis of influenza from Nasopharyngeal swab specimens and should not be used as a sole basis for treatment. Nasal washings and aspirates are unacceptable for Xpert Xpress  SARS-CoV-2/FLU/RSV testing.  Fact Sheet for Patients: EntrepreneurPulse.com.au  Fact Sheet for Healthcare Providers: IncredibleEmployment.be  This test is not yet approved or cleared by the [place] FDA and has been authorized for detection and/or diagnosis of SARS-CoV-2 by FDA under an Emergency Use Authorization (EUA). This EUA will remain in effect (meaning this test can be used) for the duration of the COVID-19 declaration under Section 564(b)(1) of the Act, 21 U.S.C. section 360bbb-3(b)(1), unless the authorization is terminated or revoked.  Performed at [facility], [place] [address] [place],  [address]       Studies: DG Chest 1  View  Result Date: 12/22/2020 CLINICAL DATA:  70 year old male with history of diabetes and stroke. EXAM: CHEST  1 VIEW COMPARISON:  Chest radiograph dated 10/08/2020 FINDINGS: No focal consolidation, pleural effusion, pneumothorax. Mild cardiomegaly. Atherosclerotic calcification of the aorta. Right pectoral pacemaker device. No acute osseous pathology. IMPRESSION: No acute cardiopulmonary process. Electronically Signed   By: Anner Crete M.D.   On: 12/22/2020 21:26   DG Abdomen 1 View  Result Date: 12/22/2020 CLINICAL DATA:  70 year old male with constipation. EXAM: ABDOMEN - 1 VIEW COMPARISON:  Radiograph dated 04/21/2016. FINDINGS: There is large amount of stool throughout the colon. No bowel dilatation or evidence of obstruction. No free air or radiopaque calculi. Osteopenia with degenerative changes of the spine. No acute osseous pathology. IMPRESSION: Constipation. No bowel obstruction. Electronically Signed   By: Anner Crete M.D.   On: 12/22/2020 21:27    Scheduled Meds:  calcium acetate  667 mg Oral TID   carvedilol  3.125 mg Oral BID WC   Chlorhexidine Gluconate Cloth  6 each Topical Q0600   clopidogrel  75 mg Oral Daily   ezetimibe  10 mg Oral QPM   finasteride  5 mg Oral Daily   insulin aspart  0-6 Units Subcutaneous Q4H   memantine  10 mg Oral BID   polyethylene glycol  17 g Oral BID   sodium chloride flush  3 mL Intravenous Q12H    Continuous Infusions:  sodium chloride     sodium chloride     sodium chloride       LOS: 0 days     Annita Brod, MD [facility]   12/23/2020, 4:12 PM

## 2020-12-24 LAB — GLUCOSE, CAPILLARY
Glucose-Capillary: 129 mg/dL — ABNORMAL HIGH (ref 70–99)
Glucose-Capillary: 152 mg/dL — ABNORMAL HIGH (ref 70–99)
Glucose-Capillary: 84 mg/dL (ref 70–99)
Glucose-Capillary: 84 mg/dL (ref 70–99)

## 2020-12-24 MED ORDER — ASPIRIN 81 MG PO TABS: 81.0000 mg | ORAL_TABLET | Freq: Every day | ORAL | Status: AC

## 2020-12-24 MED ORDER — SENNA 8.6 MG PO TABS
2.0000 | ORAL_TABLET | Freq: Every day | ORAL | Status: DC
  Administered 2020-12-24: 17.2 mg via ORAL
  Filled 2020-12-24: qty 2

## 2020-12-24 MED ORDER — BISACODYL 10 MG RE SUPP
10.0000 mg | Freq: Once | RECTAL | Status: AC
  Administered 2020-12-24: 10 mg via RECTAL
  Filled 2020-12-24: qty 1

## 2020-12-24 MED ORDER — CHOLECALCIFEROL 50 MCG (2000 UT) PO TABS: 1000.0000 [IU] | ORAL_TABLET | Freq: Every day | ORAL | Status: DC

## 2020-12-24 NOTE — Discharge Summary (Addendum)
Physician Discharge Summary  Gregory Hood [id number] DOB: July 22, 1950  PCP: Jilda Panda, MD  Admitted from: SNF Discharged to: SNF  Admit date: 12/22/2020 Discharge date: 12/24/2020  Recommendations for Outpatient Follow-up:    Follow-up Information     MD at SNF. Schedule an appointment as soon as possible for a visit in 2 day(s).          Hemodialysis center. Go on 12/25/2020.   Why: Follow-up with your hemodialysis center regarding further scheduling.        Jilda Panda, MD. Schedule an appointment as soon as possible for a visit.   Specialty: Internal Medicine Contact information: [id number] [place] [address] [phone]                  Home Health: N/A    Equipment/Devices: TBD at SNF    Discharge Condition: Improved and stable   Code Status: Full Code Diet recommendation:  Discharge Diet Orders (From admission, onward)     Start     Ordered   12/24/20 0000  Diet - low sodium heart healthy        12/24/20 1144   12/24/20 0000  Diet Carb Modified        12/24/20 1144             Discharge Diagnoses:  Active Problems:   Essential hypertension, benign   DM (diabetes mellitus), type 2 with renal complications (HCC)   Chronic diastolic heart failure (HCC)   End stage renal disease (HCC)   OSA (obstructive sleep apnea)   Normocytic anemia   Acute encephalopathy   Brief Summary: 70 year old married male, SNF resident, medical history significant for but not limited to ESRD on hemodialysis Mondays, Wednesdays and Fridays, type II DM with renal complications/IDDM, dementia, prior CVA, chronic diastolic CHF, last dialyzed on 12/17/2020, subsequently missed dialysis on 6/24 and 6/27 due to feeling poorly following fall at SNF on 6/22, brought into the ED by family on 6/27 after increased confusion.  Patient also had recent left inguinal hernia repair at [facility] on 6/17.  In the emergency department, noted to have elevated blood pressures  with SBP's in the 170s and creatinine of 12 but no significant electrolyte abnormalities.  Patient was awake albeit somewhat confused.  He was admitted to the hospitalist service for further evaluation.  CT of the head was negative for acute findings.  Nephrology was consulted and he underwent off schedule dialysis on 6/28.  Clinically improved and mental status possibly back to his baseline.  Assessment and plan:  Acute metabolic encephalopathy: Unclear etiology.  As per spouse's report today, patient sustained a fall on 6/22 when he did hit his head but no report of LOC.  Unclear if he did get any opioid pain medicines at SNF.  Missing 2 cycles of HD may have also contributed.  CT head negative for acute findings.  No focal neurological deficits.  Patient alert and oriented today.  Mental status changes seem to have resolved and he is likely back to his baseline as discussed with his spouse this morning.  She is more concerned that patient is unable to tolerate the 3 times a week dialysis which leaves him very weak and plans to speak with his nephrologist regarding cutting back on dialysis to twice a week if possible.  Avoid opioids or sedative medications.  Essential hypertension: Improved control after dialysis.  Continue prior home dose of carvedilol.  Type II DM/IDDM with renal complications: Reasonable inpatient control.  A1c 5.8 and suspect that he has not been requiring much of SSI at SNF.  Could consider monitoring CBGs and discontinuing all SSI at SNF.  Chronic diastolic CHF: Clinically euvolemic.  ESRD on MWF hemodialysis: As noted above, had off cycle HD on 6/28.  I have communicated with the dialysis team who have already seen him in the hospital and plan to dialyze him again off dialysis on 6/30 as an outpatient and then will eventually get him back on schedule.  They are also going to communicate with his primary nephrologist Dr. Johnney Ou regarding spouses request of cutting back on  HD.  OSA: Stable.  Not known if he uses CPAP at SNF and if he does then can continue same.  Anemia in ESRD: Stable.  Outpatient follow-up.  Given high B12 and folate levels, recommend stopping B12 injections and oral folate supplements-defer to MD at SNF.  Dementia: Appears to be mild.  Continue home dose of Namenda.  History of CVA: Continue prior aspirin, Plavix, Zetia and niacin.  Dyslipidemia: Continue Zetia and niacin.  S/p PPM: Outpatient follow-up with cardiology.  S/p left inguinal hernia repair 6/17 at [facility]: Wound appears to have healed.  Outpatient follow-up.  Constipation: Optimize bowel regimen.  Giving a Dulcolax suppository prior to DC.  Continue to take MiraLAX and Senokot S as primarily prescribed.  Outpatient follow-up.   Consultations: Nephrology  Procedures: Hemodialysis 6/28   Discharge Instructions  Discharge Instructions     Call MD for:   Complete by: As directed    Recurrent confusion or altered mental status.   Call MD for:  difficulty breathing, headache or visual disturbances   Complete by: As directed    Call MD for:  extreme fatigue   Complete by: As directed    Call MD for:  persistant dizziness or light-headedness   Complete by: As directed    Call MD for:  persistant nausea and vomiting   Complete by: As directed    Call MD for:  severe uncontrolled pain   Complete by: As directed    Call MD for:  temperature >100.4   Complete by: As directed    Diet - low sodium heart healthy   Complete by: As directed    Diet Carb Modified   Complete by: As directed    Increase activity slowly   Complete by: As directed    No wound care   Complete by: As directed         Medication List     STOP taking these medications    (feeding supplement) PROSource Plus liquid   feeding supplement (NEPRO CARB STEADY) Liqd   Ultram 50 MG tablet Generic drug: traMADol       TAKE these medications    acetaminophen 500 MG tablet Commonly  known as: TYLENOL Take 500 mg by mouth daily.   aspirin 81 MG tablet Take 1 tablet (81 mg total) by mouth at bedtime.   bisacodyl 10 MG suppository Commonly known as: DULCOLAX Place 10 mg rectally as needed for moderate constipation.   calcium acetate 667 MG capsule Commonly known as: PHOSLO Take 667 mg by mouth 3 (three) times daily.   carvedilol 3.125 MG tablet Commonly known as: COREG Take 1 tablet (3.125 mg total) by mouth 2 (two) times daily with a meal. Take after dialysis on dialysis days   Cholecalciferol 50 MCG (2000 UT) Tabs Take 0.5 tablets (1,000 Units total) by mouth daily.   clopidogrel 75 MG tablet Commonly known  as: PLAVIX Take 75 mg by mouth daily.   cyanocobalamin 1000 MCG/ML injection Commonly known as: (VITAMIN B-12) Inject 1,000 mcg into the muscle every 30 (thirty) days. On the 26th   EPINEPHrine 0.3 mg/0.3 mL Soaj injection Commonly known as: EPI-PEN Inject 0.3 mg into the muscle once as needed for anaphylaxis.   ezetimibe 10 MG tablet Commonly known as: ZETIA Take 10 mg by mouth every evening.   finasteride 5 MG tablet Commonly known as: PROSCAR Take 5 mg by mouth daily.   FLEET ENEMA RE Place 1 Dose rectally as needed (constipation).   folic acid 1 MG tablet Commonly known as: FOLVITE Take 2 tablets (2 mg total) by mouth daily.   GLUCAGON EMERGENCY IJ Inject 1 mg into the muscle as needed (blood sugar less than 70).   Glucose 77.4 % Gel Take 1 Dose by mouth as needed (blood glucose less than 70).   Insulin Aspart FlexPen 100 UNIT/ML Sopn Inject 2-10 Units into the skin See admin instructions. Bid per sliding scale Less than 150= 0 units, 151-200= 2 units, 201-250=4 units, 251-300= 6 units, 301-350= 8 units, 351-400= 10 units, if less than 70 or over 401 call md   memantine 10 MG tablet Commonly known as: NAMENDA Take 10 mg by mouth 2 (two) times daily.   multivitamin Tabs tablet Take 1 tablet by mouth daily.   niacin 100 MG  tablet Take 200 mg by mouth every evening.   ondansetron 4 MG disintegrating tablet Commonly known as: ZOFRAN-ODT Take 4 mg by mouth every 4 (four) hours as needed for nausea or vomiting.   OVER THE COUNTER MEDICATION Take 1 Dose by mouth every evening. Yogurt for weight loss   polyethylene glycol 17 g packet Commonly known as: MIRALAX / GLYCOLAX Take 17 g by mouth 2 (two) times daily. What changed:  when to take this reasons to take this   Red Yeast Rice 600 MG Caps Take 1,200 mg by mouth at bedtime.   senna-docusate 8.6-50 MG tablet Commonly known as: Senokot-S Take 2 tablets by mouth daily. What changed:  how much to take when to take this       ASK your doctor about these medications    prednisoLONE acetate 1 % ophthalmic suspension Commonly known as: PRED FORTE Place 1 drop into the left eye at bedtime.       Allergies  Allergen Reactions   Bee Venom Swelling   Lexiscan [Regadenoson] Other (See Comments)    Seizure like-activity after lexiscan given.   Statins     Other reaction(s): Other (See Comments) Myalgias and CK increase.  Tried both atorvastatin and rosuvastatin    Ace Inhibitors Cough      Procedures/Studies: DG Chest 1 View  Result Date: 12/22/2020 CLINICAL DATA:  70 year old male with history of diabetes and stroke. EXAM: CHEST  1 VIEW COMPARISON:  Chest radiograph dated 10/08/2020 FINDINGS: No focal consolidation, pleural effusion, pneumothorax. Mild cardiomegaly. Atherosclerotic calcification of the aorta. Right pectoral pacemaker device. No acute osseous pathology. IMPRESSION: No acute cardiopulmonary process. Electronically Signed   By: Anner Crete M.D.   On: 12/22/2020 21:26   DG Abdomen 1 View  Result Date: 12/22/2020 CLINICAL DATA:  70 year old male with constipation. EXAM: ABDOMEN - 1 VIEW COMPARISON:  Radiograph dated 04/21/2016. FINDINGS: There is large amount of stool throughout the colon. No bowel dilatation or evidence of  obstruction. No free air or radiopaque calculi. Osteopenia with degenerative changes of the spine. No acute osseous pathology. IMPRESSION:  Constipation. No bowel obstruction. Electronically Signed   By: Anner Crete M.D.   On: 12/22/2020 21:27   CT HEAD WO CONTRAST  Result Date: 12/22/2020 CLINICAL DATA:  Altered mental status EXAM: CT HEAD WITHOUT CONTRAST TECHNIQUE: Contiguous axial images were obtained from the base of the skull through the vertex without intravenous contrast. COMPARISON:  10/08/2020 FINDINGS: Brain: Diffuse atrophic changes are identified with chronic white matter ischemic change. Findings of prior infarct in the cerebellar hemispheres bilaterally stable in appearance from the prior exam. Small lacunar infarcts are noted within the thalami bilaterally. Basal ganglia calcifications are noted. Vascular: No hyperdense vessel or unexpected calcification. Skull: Normal. Negative for fracture or focal lesion. Sinuses/Orbits: No acute finding. Other: None IMPRESSION: Chronic atrophic and ischemic changes without acute abnormality. Electronically Signed   By: Inez Catalina M.D.   On: 12/22/2020 14:05      Subjective: Alert and oriented x3.  Reports last BM 2 to 3 days ago.  No abdominal pain, nausea or vomiting.  Denies any other complaints including pain anywhere.  Discharge Exam:  Vitals:   12/24/20 0008 12/24/20 0406 12/24/20 0845 12/24/20 0852  BP: (!) 153/83 (!) 145/79 (!) 145/76 (!) 145/76  Pulse: 72 68 71 70  Resp: '19 18  18  '$ Temp: 97.8 F (36.6 C) 98.1 F (36.7 C)  98.3 F (36.8 C)  TempSrc:    Oral  SpO2: 100% 100% 100% 100%  Weight:        General: Pt lying comfortably in bed & appears in no obvious distress. Cardiovascular: S1 & S2 heard, RRR, S1/S2 +. No murmurs, rubs, gallops or clicks. No JVD or pedal edema.  Telemetry personally reviewed: Atrial paced rhythm. Respiratory: Clear to auscultation without wheezing, rhonchi or crackles. No increased work of  breathing. Abdominal:  Non distended, non tender & soft. No organomegaly or masses appreciated. Normal bowel sounds heard.  Left groin inguinal hernia repair site with healed wound.  Some underlying induration but no features of infection or cellulitis. CNS: Alert and oriented x3. No focal deficits.  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 Extremities: no edema, no cyanosis.  Left upper arm AV fistula for dialysis.    The results of significant diagnostics from this hospitalization (including imaging, microbiology, ancillary and laboratory) are listed below for reference.     Microbiology: Recent Results (from the past 240 hour(s))  Resp Panel by RT-PCR (Flu A&B, Covid) Nasopharyngeal Swab     Status: None   Collection Time: 12/22/20  7:01 PM   Specimen: Nasopharyngeal Swab; Nasopharyngeal(NP) swabs in vial transport medium  Result Value Ref Range Status   SARS Coronavirus 2 by RT PCR NEGATIVE NEGATIVE Final    Comment: (NOTE) SARS-CoV-2 target nucleic acids are NOT DETECTED.  The SARS-CoV-2 RNA is generally detectable in upper respiratory specimens during the acute phase of infection. The lowest concentration of SARS-CoV-2 viral copies this assay can detect is 138 copies/mL. A negative result does not preclude SARS-Cov-2 infection and should not be used as the sole basis for treatment or other patient management decisions. A negative result may occur with  improper specimen collection/handling, submission of specimen other than nasopharyngeal swab, presence of viral mutation(s) within the areas targeted by this assay, and inadequate number of viral copies(<138 copies/mL). A negative result must be combined with clinical observations, patient history, and epidemiological information. The expected result is Negative.  Fact Sheet for Patients:  EntrepreneurPulse.com.au  Fact Sheet for Healthcare Providers:  IncredibleEmployment.be  This test is no t yet approved or cleared by the [place] and  has been authorized for detection and/or diagnosis of SARS-CoV-2 by FDA under an Emergency Use Authorization (EUA). This EUA will remain  in effect (meaning this test can be used) for the duration of the COVID-19 declaration under Section 564(b)(1) of the Act, 21 U.S.C.section 360bbb-3(b)(1), unless the authorization is terminated  or revoked sooner.       Influenza A by PCR NEGATIVE NEGATIVE Final   Influenza B by PCR NEGATIVE NEGATIVE Final    Comment: (NOTE) The Xpert Xpress SARS-CoV-2/FLU/RSV plus assay is intended as an aid in the diagnosis of influenza from Nasopharyngeal swab specimens and should not be used as a sole basis for treatment. Nasal washings and aspirates are unacceptable for Xpert Xpress SARS-CoV-2/FLU/RSV testing.  Fact Sheet for Patients: EntrepreneurPulse.com.au  Fact Sheet for Healthcare Providers: IncredibleEmployment.be  This test is not yet approved or cleared by the [place] FDA and has been authorized for detection and/or diagnosis of SARS-CoV-2 by FDA under an Emergency Use Authorization (EUA). This EUA will remain in effect (meaning this test can be used) for the duration of the COVID-19 declaration under Section 564(b)(1) of the Act, 21 U.S.C. section 360bbb-3(b)(1), unless the authorization is terminated or revoked.  Performed at [facility], [place] [address], [place], [place] [address]      Labs: CBC: Recent Labs  Lab 12/22/20 1107 12/23/20 1005  WBC 5.1 5.4  NEUTROABS  --  3.9  HGB 9.9* 10.0*  HCT 31.3* 30.7*  MCV 96.3 93.9  PLT 260 [id number]    Basic Metabolic Panel: Recent Labs  Lab 12/22/20 1107 12/23/20 1005  NA 136 136  K 4.0 4.1  CL 95* 93*  CO2 26 21*  GLUCOSE 107* 85  BUN 69* 74*  CREATININE 12.37* 12.97*  CALCIUM 9.1 9.3  MG  --  2.5*  PHOS  --  4.7*     Liver Function Tests: Recent Labs  Lab 12/22/20 1107 12/23/20 1005  AST 30 29  ALT 11 8  ALKPHOS 59 64  BILITOT 0.7 0.8  PROT 7.3 7.1  ALBUMIN 3.5 3.5    CBG: Recent Labs  Lab 12/23/20 1129 12/23/20 2007 12/24/20 0011 12/24/20 0413 12/24/20 0722  GLUCAP 122* 100* 152* 84 84    Hgb A1c Recent Labs    12/22/20 1840  HGBA1C 5.8*      Thyroid function studies Recent Labs    12/23/20 1005  TSH 1.554    Anemia work up Recent Labs    12/22/20 1911  VITAMINB12 2,395*  FOLATE >100.0  FERRITIN 1,142*  TIBC 216*  IRON 61  RETICCTPCT 1.3    Urinalysis    Component Value Date/Time   COLORURINE YELLOW 12/22/2020 1645   APPEARANCEUR CLEAR 12/22/2020 1645   LABSPEC 1.014 12/22/2020 1645   PHURINE 7.0 12/22/2020 1645   GLUCOSEU NEGATIVE 12/22/2020 1645   HGBUR SMALL (A) 12/22/2020 1645   BILIRUBINUR NEGATIVE 12/22/2020 1645   KETONESUR NEGATIVE 12/22/2020 1645   PROTEINUR 100 (A) 12/22/2020 1645   UROBILINOGEN 0.2 09/24/2014 0216   NITRITE NEGATIVE 12/22/2020 1645   LEUKOCYTESUR NEGATIVE 12/22/2020 1645    I discussed in detail with patient spouse, updated care and answered all questions to her satisfaction.  Advised her of patient's discharge to SNF today.  She verbalized understanding.  She was appreciative of the call  Time coordinating discharge: 40 minutes  SIGNED:  Vernell Leep, MD, FACP,  SFHM. [facility]  To contact the attending provider between 7A-7P or the covering provider during after hours 7P-7A, please log into the web site [email or web address] and access using universal [place] password for that web site. If you do not have the password, please call the hospital operator.

## 2020-12-24 NOTE — TOC Transition Note (Signed)
Transition of Care [facility]) - CM/SW Discharge Note   Patient Details  Name: Gregory Hood MRN: [id number] Date of Birth: [birth year]  [facility] of [facility]) CM/SW Contact:  Geralynn Ochs, LCSW Phone Number: 12/24/2020, 12:57 PM   Clinical Narrative:  Nurse to call report to [phone].    Final next level of care: Skilled Nursing Facility Barriers to Discharge: Barriers Resolved   Patient Goals and CMS Choice Patient states their goals for this hospitalization and ongoing recovery are:: patient unable to participate in goal setting, not fully oriented CMS Medicare.gov Compare Post Acute Care list provided to:: Patient Represenative (must comment) Choice offered to / list presented to : Spouse  Discharge Placement              Patient chooses bed at: [facility] Patient to be transferred to facility by: [facility] Name of family member notified: [name] Patient and family notified of of transfer: 12/24/20  Discharge Plan and Services                                     Social Determinants of Health (SDOH) Interventions     Readmission Risk Interventions No flowsheet data found.

## 2020-12-24 NOTE — Progress Notes (Addendum)
[facility] Progress Note   Subjective:    Patient seen and examined at bedside. Reports feeling tired. Denies Tolerated HD yesterday with net UF 2L. Denies SOB, CP, ABD pain, and N/V/D. Plan for HD 6/30.  Objective Vitals:   12/24/20 0008 12/24/20 0406 12/24/20 0845 12/24/20 0852  BP: (!) 153/83 (!) 145/79 (!) 145/76 (!) 145/76  Pulse: 72 68 71 70  Resp: '19 18  18  '$ Temp: 97.8 F (36.6 C) 98.1 F (36.7 C)  98.3 F (36.8 C)  TempSrc:    Oral  SpO2: 100% 100% 100% 100%  Weight:       Physical Exam General: Weak-appearing; NAD Heart:S1 and S2; No murmurs, gallops, or rubs Lungs: Clear anteriorly and laterally; No wheezing, rales, or rhonchi Abdomen: Soft, mildly tender L groin (recent L inguinal hernia repair) Extremities: No edema BLLE Dialysis Access: L AVG (+) Bruit/Thrill   Filed Weights   12/22/20 2243 12/23/20 1507 12/23/20 1813  Weight: 89.4 kg 89 kg 87 kg    Intake/Output Summary (Last 24 hours) at 12/24/2020 0923 Last data filed at 12/23/2020 1813 Gross per 24 hour  Intake --  Output 2000 ml  Net -2000 ml    Additional Objective Labs: Basic Metabolic Panel: Recent Labs  Lab 12/22/20 1107 12/23/20 1005  NA 136 136  K 4.0 4.1  CL 95* 93*  CO2 26 21*  GLUCOSE 107* 85  BUN 69* 74*  CREATININE 12.37* 12.97*  CALCIUM 9.1 9.3  PHOS  --  4.7*   Liver Function Tests: Recent Labs  Lab 12/22/20 1107 12/23/20 1005  AST 30 29  ALT 11 8  ALKPHOS 59 64  BILITOT 0.7 0.8  PROT 7.3 7.1  ALBUMIN 3.5 3.5   No results for input(s): LIPASE, AMYLASE in the last 168 hours. CBC: Recent Labs  Lab 12/22/20 1107 12/23/20 1005  WBC 5.1 5.4  NEUTROABS  --  3.9  HGB 9.9* 10.0*  HCT 31.3* 30.7*  MCV 96.3 93.9  PLT 260 287   Blood Culture No results found for: SDES, SPECREQUEST, CULT, REPTSTATUS  Cardiac Enzymes: Recent Labs  Lab 12/22/20 1239  CKTOTAL 227   CBG: Recent Labs  Lab 12/23/20 1129 12/23/20 2007 12/24/20 0011  12/24/20 0413 12/24/20 0722  GLUCAP 122* 100* 152* 84 84   Iron Studies:  Recent Labs    12/22/20 1911  IRON 61  TIBC 216*  FERRITIN 1,142*   Lab Results  Component Value Date   INR 1.36 07/20/2018   INR 0.95 03/08/2010   Studies/Results: DG Chest 1 View  Result Date: 12/22/2020 CLINICAL DATA:  70 year old male with history of diabetes and stroke. EXAM: CHEST  1 VIEW COMPARISON:  Chest radiograph dated 10/08/2020 FINDINGS: No focal consolidation, pleural effusion, pneumothorax. Mild cardiomegaly. Atherosclerotic calcification of the aorta. Right pectoral pacemaker device. No acute osseous pathology. IMPRESSION: No acute cardiopulmonary process. Electronically Signed   By: Anner Crete M.D.   On: 12/22/2020 21:26   DG Abdomen 1 View  Result Date: 12/22/2020 CLINICAL DATA:  70 year old male with constipation. EXAM: ABDOMEN - 1 VIEW COMPARISON:  Radiograph dated 04/21/2016. FINDINGS: There is large amount of stool throughout the colon. No bowel dilatation or evidence of obstruction. No free air or radiopaque calculi. Osteopenia with degenerative changes of the spine. No acute osseous pathology. IMPRESSION: Constipation. No bowel obstruction. Electronically Signed   By: Anner Crete M.D.   On: 12/22/2020 21:27   CT HEAD WO CONTRAST  Result Date: 12/22/2020 CLINICAL DATA:  Altered mental status EXAM: CT HEAD WITHOUT CONTRAST TECHNIQUE: Contiguous axial images were obtained from the base of the skull through the vertex without intravenous contrast. COMPARISON:  10/08/2020 FINDINGS: Brain: Diffuse atrophic changes are identified with chronic white matter ischemic change. Findings of prior infarct in the cerebellar hemispheres bilaterally stable in appearance from the prior exam. Small lacunar infarcts are noted within the thalami bilaterally. Basal ganglia calcifications are noted. Vascular: No hyperdense vessel or unexpected calcification. Skull: Normal. Negative for fracture or focal  lesion. Sinuses/Orbits: No acute finding. Other: None IMPRESSION: Chronic atrophic and ischemic changes without acute abnormality. Electronically Signed   By: Inez Catalina M.D.   On: 12/22/2020 14:05    Medications:  sodium chloride      calcium acetate  667 mg Oral TID   carvedilol  3.125 mg Oral BID WC   Chlorhexidine Gluconate Cloth  6 each Topical Q0600   clopidogrel  75 mg Oral Daily   ezetimibe  10 mg Oral QPM   finasteride  5 mg Oral Daily   insulin aspart  0-6 Units Subcutaneous Q4H   memantine  10 mg Oral BID   polyethylene glycol  17 g Oral BID   senna  2 tablet Oral Daily   sodium chloride flush  3 mL Intravenous Q12H    Dialysis Orders: MWF - [facility]  3hrs/75mn, BFR 450, DFR 600,  EDW90kg, 2K/ 2Ca  Assessment/Plan: Acute encephalopathy: Patient with dementia; received HD 6/28-mental status appears to be back to baseline. ESRD - usual HD MWF. Missed two treatments prior to admit. Received HD 6/28-tolerated net UF 2L. HD currently off schedule. Plan for HD 6/30. Hypertension/volume  - Blood pressures improved after HD. Continue Carvedilol. Anemia of CKD - Hgb now 10.0-appears ESA held back in May for Hgb 12-will monitor trend;may need to resume here. Secondary Hyperparathyroidism -  Ca and PO4 at goal-continue binder Nutrition - Renal diet with fluid restriction-protein supplement ordered Disposition: Spoke with Dr. HAlgis Limingwho reports talking with patient's wife earlier today-patient is scheduled for discharge today. Since he is currently off HD schedule, plan is for him to receive HD in OP HD center 6/30 (short treatment) then again on Friday 12/26/20 per his usual schedule. Attempted to call Mrs. Crowl to follow-up with discharge plans; however, she did not pick up the phone. A VM was left to call the HD center with questions. I will also reach out to Charge RN at OP HD center to update her on current discharge plans.    CTobie Poet NP [facility] 12/24/2020,9:23 AM  LOS: 1 day

## 2020-12-24 NOTE — Progress Notes (Addendum)
Navigator appreciates update from [facility] CSW/L. Ward Chatters that patient is medically ready for discharge back to SNF today. Navigator notes that he is off schedule for HD and spoke with Renal NP/C. Anderson. Navigator called patient's outpt HD clinic/[facility] to see if they could accommodate him tomorrow off schedule and then back to regular schedule Friday, to ensure 3 treatments this week, and they can offer him a 6:40am (arrive at 6:20am) seat tomorrow. Navigator appreciates CSW contacting [facility] SNF to see if they can transport off schedule and reports that they can. Navigator spoke with patient's wife regarding this plan. She states that she does not want patient to have back to back treatments like this and does not wish for him to have another HD treatment until his regular scheduled time on Friday. She added that she would only like her husband to have HD 2 times per week moving forward. Navigator informed her that she will have to speak with the Medical Director at patient's clinic regarding her wishes for ongoing treatment, but as far as tomorrow, Navigator will update the inpatient team. She was firm, but very polite.  Navigator updated Renal NP and she requested to speak with patient's wife before plan for outpt HD tomorrow be canceled. Renal PA then informed Navigator that she has left wife a message and will follow up with [facility] directly. Navigator updated [facility] CSW that this is not a barrier to discharge to SNF today, as this can be followed up on after discharge as needed.    Alphonzo Cruise, [place] [facility]al Navigator [phone]

## 2020-12-24 NOTE — Discharge Instructions (Signed)

## 2020-12-24 NOTE — NC FL2 (Signed)
[place] MEDICAID FL2 LEVEL OF CARE SCREENING TOOL     IDENTIFICATION  Patient Name: Gregory Hood Birthdate: 1951-02-01 Sex: male Admission Date (Current Location): 12/22/2020  [facility] and [place] Number:  [employer] and Address:  [facility] [place], [place] [address] [place], [place] [address]      Provider Number: [id number]  Attending Physician Name and Address:  Modena Jansky, MD  Relative Name and Phone Number:       Current Level of Care: Hospital Recommended Level of Care: [place] Prior Approval Number:    Date Approved/Denied:   PASRR Number:    Discharge Plan: SNF    Current Diagnoses: Patient Active Problem List   Diagnosis Date Noted   Wide-complex tachycardia ([place]) 10/08/2020   Syncope 10/08/2020   History of completed stroke 07/11/2018   Acute encephalopathy 07/11/2018   Cellulitis of right leg 07/11/2018   Hypertensive urgency 07/11/2018   Acute exacerbation of CHF (congestive heart failure) ([place]) 03/22/2018   CVA (cerebral vascular accident) ([place]) 03/22/2018   OSA (obstructive sleep apnea) 03/22/2018   BPH (benign prostatic hyperplasia) 03/22/2018   Thrombocytopenia ([place]) 03/22/2018   Normocytic anemia 99991111   Metabolic acidosis [id number]   Uremia 03/22/2018   End stage renal disease ([place]) 03/14/2014   Orthostatic hypotension 02/22/2014   Elevated troponin 02/22/2014   Chronic diastolic heart failure ([place]) 02/22/2014   Elevated lipids 02/22/2014   Essential hypertension, benign 02/17/2014   DM (diabetes mellitus), type 2 with renal complications ([place]) [id number]   Abnormal ECG 02/17/2014    Orientation RESPIRATION BLADDER Height & Weight     Self, Place, Time  Normal Incontinent Weight: 191 lb 12.8 oz (87 kg) Height:     BEHAVIORAL SYMPTOMS/MOOD NEUROLOGICAL BOWEL NUTRITION STATUS      Continent Diet (see DC summary)  AMBULATORY STATUS COMMUNICATION OF NEEDS Skin   Limited Assist  Verbally Surgical wounds (closed left pelvis, skin glue)                       Personal Care Assistance Level of Assistance  Bathing, Feeding, Dressing Bathing Assistance: Limited assistance Feeding assistance: Limited assistance Dressing Assistance: Limited assistance     Functional Limitations Info  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)        SPECIAL CARE FACTORS FREQUENCY                       Contractures Contractures Info: Not present    Additional Factors Info  Code Status, Allergies, Insulin Sliding Scale Code Status Info: Full Allergies Info: Bee Venom, Lexiscan (Regadenoson), Statins, Ace Inhibitors   Insulin Sliding Scale Info: see DC summary       Current Medications (12/24/2020):  This is the current hospital active medication list Current Facility-Administered Medications  Medication Dose Route Frequency Provider Last Rate Last Admin   0.9 %  sodium chloride infusion  250 mL Intravenous PRN Doutova, Anastassia, MD       acetaminophen (TYLENOL) tablet 650 mg  650 mg Oral Q6H PRN Doutova, Anastassia, MD       Or   acetaminophen (TYLENOL) suppository 650 mg  650 mg Rectal Q6H PRN Doutova, Anastassia, MD       bisacodyl (DULCOLAX) suppository 10 mg  10 mg Rectal Daily PRN Heloise Purpura, RPH       calcium acetate (PHOSLO) capsule 667 mg  667 mg Oral TID Toy Baker, MD  667 mg at 12/24/20 1221   carvedilol (COREG) tablet 3.125 mg  3.125 mg Oral BID WC Doutova, Anastassia, MD   3.125 mg at 12/24/20 0845   Chlorhexidine Gluconate Cloth 2 % PADS 6 each  6 each Topical Q0600 Adelfa Koh, NP       clopidogrel (PLAVIX) tablet 75 mg  75 mg Oral Daily Doutova, Anastassia, MD   75 mg at 12/24/20 1221   ezetimibe (ZETIA) tablet 10 mg  10 mg Oral QPM Doutova, Anastassia, MD   10 mg at 12/22/20 2352   finasteride (PROSCAR) tablet 5 mg  5 mg Oral Daily Doutova, Anastassia, MD   5 mg at 12/24/20 1221   HYDROcodone-acetaminophen  (NORCO/VICODIN) 5-325 MG per tablet 1-2 tablet  1-2 tablet Oral Q4H PRN Doutova, Anastassia, MD       insulin aspart (novoLOG) injection 0-6 Units  0-6 Units Subcutaneous Q4H Doutova, Anastassia, MD   1 Units at 12/24/20 0015   memantine (NAMENDA) tablet 10 mg  10 mg Oral BID Toy Baker, MD   10 mg at 12/24/20 1221   polyethylene glycol (MIRALAX / GLYCOLAX) packet 17 g  17 g Oral BID Toy Baker, MD   17 g at 12/24/20 1223   senna (SENOKOT) tablet 17.2 mg  2 tablet Oral Daily Vernell Leep D, MD   17.2 mg at 12/24/20 1221   sodium chloride flush (NS) 0.9 % injection 3 mL  3 mL Intravenous Q12H Doutova, Anastassia, MD   3 mL at 12/24/20 0730   sodium chloride flush (NS) 0.9 % injection 3 mL  3 mL Intravenous PRN Doutova, Nyoka Lint, MD       traMADol (ULTRAM) tablet 25 mg  25 mg Oral Q12H PRN Toy Baker, MD         Discharge Medications: Please see discharge summary for a list of discharge medications.  Relevant Imaging Results:  Relevant Lab Results:   Additional Information SS#: [id number]  Geralynn Ochs, LCSW

## 2020-12-24 NOTE — Plan of Care (Signed)
  Problem: Education: Goal: Knowledge of General Education information will improve Description: Including pain rating scale, medication(s)/side effects and non-pharmacologic comfort measures Outcome: Adequate for Discharge   Problem: Health Behavior/Discharge Planning: Goal: Ability to manage health-related needs will improve Outcome: Adequate for Discharge   Problem: Clinical Measurements: Goal: Ability to maintain clinical measurements within normal limits will improve Outcome: Adequate for Discharge Goal: Will remain free from infection Outcome: Adequate for Discharge Goal: Diagnostic test results will improve Outcome: Adequate for Discharge Goal: Respiratory complications will improve Outcome: Adequate for Discharge Goal: Cardiovascular complication will be avoided Outcome: Adequate for Discharge   Problem: Activity: Goal: Risk for activity intolerance will decrease Outcome: Adequate for Discharge   Problem: Nutrition: Goal: Adequate nutrition will be maintained Outcome: Adequate for Discharge   Problem: Elimination: Goal: Will not experience complications related to bowel motility Outcome: Adequate for Discharge Goal: Will not experience complications related to urinary retention Outcome: Adequate for Discharge   Problem: Coping: Goal: Level of anxiety will decrease Outcome: Adequate for Discharge   Problem: Pain Managment: Goal: General experience of comfort will improve Outcome: Adequate for Discharge   Problem: Skin Integrity: Goal: Risk for impaired skin integrity will decrease Outcome: Adequate for Discharge

## 2023-06-02 DIAGNOSIS — I340 Nonrheumatic mitral (valve) insufficiency: Secondary | ICD-10-CM | POA: Diagnosis not present

## 2023-06-02 DIAGNOSIS — I371 Nonrheumatic pulmonary valve insufficiency: Secondary | ICD-10-CM | POA: Diagnosis not present

## 2023-06-02 DIAGNOSIS — I4891 Unspecified atrial fibrillation: Secondary | ICD-10-CM | POA: Diagnosis not present

## 2023-06-02 DIAGNOSIS — I472 Ventricular tachycardia, unspecified: Secondary | ICD-10-CM | POA: Diagnosis not present

## 2023-06-02 DIAGNOSIS — R296 Repeated falls: Secondary | ICD-10-CM | POA: Diagnosis not present

## 2023-06-02 DIAGNOSIS — I361 Nonrheumatic tricuspid (valve) insufficiency: Secondary | ICD-10-CM | POA: Diagnosis not present

## 2023-06-02 DIAGNOSIS — I5032 Chronic diastolic (congestive) heart failure: Secondary | ICD-10-CM | POA: Diagnosis not present

## 2023-06-03 DIAGNOSIS — I5032 Chronic diastolic (congestive) heart failure: Secondary | ICD-10-CM | POA: Diagnosis not present

## 2023-06-03 DIAGNOSIS — R296 Repeated falls: Secondary | ICD-10-CM | POA: Diagnosis not present

## 2023-06-03 DIAGNOSIS — I472 Ventricular tachycardia, unspecified: Secondary | ICD-10-CM | POA: Diagnosis not present

## 2023-06-03 DIAGNOSIS — I4891 Unspecified atrial fibrillation: Secondary | ICD-10-CM | POA: Diagnosis not present

## 2023-11-11 ENCOUNTER — Emergency Department (HOSPITAL_COMMUNITY)

## 2023-11-11 ENCOUNTER — Inpatient Hospital Stay (HOSPITAL_COMMUNITY)
Admission: EM | Admit: 2023-11-11 | Discharge: 2023-11-13 | DRG: 444 | Disposition: A | Discharge status: Other Acute Inpatient Hospital | Attending: Student | Admitting: Student

## 2023-11-11 ENCOUNTER — Other Ambulatory Visit: Payer: Self-pay

## 2023-11-11 ENCOUNTER — Encounter (HOSPITAL_COMMUNITY): Payer: Self-pay

## 2023-11-11 DIAGNOSIS — I132 Hypertensive heart and chronic kidney disease with heart failure and with stage 5 chronic kidney disease, or end stage renal disease: Secondary | ICD-10-CM | POA: Diagnosis present

## 2023-11-11 DIAGNOSIS — Z79899 Other long term (current) drug therapy: Secondary | ICD-10-CM

## 2023-11-11 DIAGNOSIS — E1122 Type 2 diabetes mellitus with diabetic chronic kidney disease: Secondary | ICD-10-CM | POA: Diagnosis present

## 2023-11-11 DIAGNOSIS — E669 Obesity, unspecified: Secondary | ICD-10-CM | POA: Diagnosis present

## 2023-11-11 DIAGNOSIS — Z87891 Personal history of nicotine dependence: Secondary | ICD-10-CM

## 2023-11-11 DIAGNOSIS — Z8673 Personal history of transient ischemic attack (TIA), and cerebral infarction without residual deficits: Secondary | ICD-10-CM

## 2023-11-11 DIAGNOSIS — E11319 Type 2 diabetes mellitus with unspecified diabetic retinopathy without macular edema: Secondary | ICD-10-CM | POA: Diagnosis present

## 2023-11-11 DIAGNOSIS — Z8249 Family history of ischemic heart disease and other diseases of the circulatory system: Secondary | ICD-10-CM | POA: Diagnosis not present

## 2023-11-11 DIAGNOSIS — Z7982 Long term (current) use of aspirin: Secondary | ICD-10-CM

## 2023-11-11 DIAGNOSIS — N2581 Secondary hyperparathyroidism of renal origin: Secondary | ICD-10-CM | POA: Diagnosis present

## 2023-11-11 DIAGNOSIS — E1165 Type 2 diabetes mellitus with hyperglycemia: Secondary | ICD-10-CM | POA: Diagnosis present

## 2023-11-11 DIAGNOSIS — Z950 Presence of cardiac pacemaker: Secondary | ICD-10-CM

## 2023-11-11 DIAGNOSIS — E1136 Type 2 diabetes mellitus with diabetic cataract: Secondary | ICD-10-CM | POA: Diagnosis present

## 2023-11-11 DIAGNOSIS — Z7902 Long term (current) use of antithrombotics/antiplatelets: Secondary | ICD-10-CM

## 2023-11-11 DIAGNOSIS — Z992 Dependence on renal dialysis: Secondary | ICD-10-CM | POA: Diagnosis not present

## 2023-11-11 DIAGNOSIS — N186 End stage renal disease: Secondary | ICD-10-CM | POA: Diagnosis present

## 2023-11-11 DIAGNOSIS — J449 Chronic obstructive pulmonary disease, unspecified: Secondary | ICD-10-CM | POA: Diagnosis present

## 2023-11-11 DIAGNOSIS — K811 Chronic cholecystitis: Principal | ICD-10-CM | POA: Diagnosis present

## 2023-11-11 DIAGNOSIS — Z794 Long term (current) use of insulin: Secondary | ICD-10-CM

## 2023-11-11 DIAGNOSIS — K819 Cholecystitis, unspecified: Secondary | ICD-10-CM | POA: Diagnosis not present

## 2023-11-11 DIAGNOSIS — F0393 Unspecified dementia, unspecified severity, with mood disturbance: Secondary | ICD-10-CM | POA: Diagnosis present

## 2023-11-11 DIAGNOSIS — I495 Sick sinus syndrome: Secondary | ICD-10-CM | POA: Diagnosis present

## 2023-11-11 DIAGNOSIS — D631 Anemia in chronic kidney disease: Secondary | ICD-10-CM | POA: Diagnosis present

## 2023-11-11 DIAGNOSIS — E11649 Type 2 diabetes mellitus with hypoglycemia without coma: Secondary | ICD-10-CM | POA: Diagnosis present

## 2023-11-11 DIAGNOSIS — E785 Hyperlipidemia, unspecified: Secondary | ICD-10-CM | POA: Diagnosis present

## 2023-11-11 DIAGNOSIS — I5032 Chronic diastolic (congestive) heart failure: Secondary | ICD-10-CM | POA: Diagnosis present

## 2023-11-11 DIAGNOSIS — G4733 Obstructive sleep apnea (adult) (pediatric): Secondary | ICD-10-CM | POA: Diagnosis present

## 2023-11-11 DIAGNOSIS — F32A Depression, unspecified: Secondary | ICD-10-CM | POA: Diagnosis present

## 2023-11-11 DIAGNOSIS — G8929 Other chronic pain: Secondary | ICD-10-CM | POA: Diagnosis present

## 2023-11-11 DIAGNOSIS — R079 Chest pain, unspecified: Secondary | ICD-10-CM | POA: Diagnosis present

## 2023-11-11 DIAGNOSIS — Z833 Family history of diabetes mellitus: Secondary | ICD-10-CM | POA: Diagnosis not present

## 2023-11-11 DIAGNOSIS — I472 Ventricular tachycardia, unspecified: Secondary | ICD-10-CM | POA: Diagnosis present

## 2023-11-11 DIAGNOSIS — R1111 Vomiting without nausea: Principal | ICD-10-CM

## 2023-11-11 DIAGNOSIS — R112 Nausea with vomiting, unspecified: Secondary | ICD-10-CM | POA: Diagnosis not present

## 2023-11-11 HISTORY — DX: End stage renal disease: N18.6

## 2023-11-11 LAB — COMPREHENSIVE METABOLIC PANEL WITH GFR
ALT: 13 U/L (ref 0–44)
AST: 27 U/L (ref 15–41)
Albumin: 3.5 g/dL (ref 3.5–5.0)
Alkaline Phosphatase: 69 U/L (ref 38–126)
Anion gap: 15 (ref 5–15)
BUN: 21 mg/dL (ref 8–23)
CO2: 29 mmol/L (ref 22–32)
Calcium: 7.8 mg/dL — ABNORMAL LOW (ref 8.9–10.3)
Chloride: 93 mmol/L — ABNORMAL LOW (ref 98–111)
Creatinine, Ser: 7.54 mg/dL — ABNORMAL HIGH (ref 0.61–1.24)
GFR, Estimated: 7 mL/min — ABNORMAL LOW (ref >60)
Glucose, Bld: 84 mg/dL (ref 70–99)
Potassium: 4.2 mmol/L (ref 3.5–5.1)
Sodium: 137 mmol/L (ref 135–145)
Total Bilirubin: 0.8 mg/dL (ref 0.0–1.2)
Total Protein: 7.1 g/dL (ref 6.5–8.1)

## 2023-11-11 LAB — CBC WITH DIFFERENTIAL/PLATELET
Abs Immature Granulocytes: 0.01 10*3/uL (ref 0.00–0.07)
Basophils Absolute: 0 10*3/uL (ref 0.0–0.1)
Basophils Relative: 0 %
Eosinophils Absolute: 0.1 10*3/uL (ref 0.0–0.5)
Eosinophils Relative: 3 %
HCT: 36.9 % — ABNORMAL LOW (ref 39.0–52.0)
Hemoglobin: 11.5 g/dL — ABNORMAL LOW (ref 13.0–17.0)
Immature Granulocytes: 0 %
Lymphocytes Relative: 9 %
Lymphs Abs: 0.4 10*3/uL — ABNORMAL LOW (ref 0.7–4.0)
MCH: 28.9 pg (ref 26.0–34.0)
MCHC: 31.2 g/dL (ref 30.0–36.0)
MCV: 92.7 fL (ref 80.0–100.0)
Monocytes Absolute: 0.5 10*3/uL (ref 0.1–1.0)
Monocytes Relative: 10 %
Neutro Abs: 3.8 10*3/uL (ref 1.7–7.7)
Neutrophils Relative %: 78 %
Platelets: 139 10*3/uL — ABNORMAL LOW (ref 150–400)
RBC: 3.98 MIL/uL — ABNORMAL LOW (ref 4.22–5.81)
RDW: 15.7 % — ABNORMAL HIGH (ref 11.5–15.5)
WBC: 4.9 10*3/uL (ref 4.0–10.5)
nRBC: 0 % (ref 0.0–0.2)

## 2023-11-11 LAB — HEMOGLOBIN A1C
Hgb A1c MFr Bld: 4.8 % (ref 4.8–5.6)
Mean Plasma Glucose: 91.06 mg/dL

## 2023-11-11 LAB — TROPONIN I (HIGH SENSITIVITY)
Troponin I (High Sensitivity): 43 ng/L — ABNORMAL HIGH (ref <18)
Troponin I (High Sensitivity): 45 ng/L — ABNORMAL HIGH (ref <18)

## 2023-11-11 MED ORDER — RENA-VITE PO TABS
1.0000 | ORAL_TABLET | Freq: Every day | ORAL | Status: DC
  Administered 2023-11-12 – 2023-11-13 (×2): 1 via ORAL
  Filled 2023-11-11 (×2): qty 1

## 2023-11-11 MED ORDER — VITAMIN D 25 MCG (1000 UNIT) PO TABS
1000.0000 [IU] | ORAL_TABLET | Freq: Every day | ORAL | Status: DC
  Administered 2023-11-12 – 2023-11-13 (×2): 1000 [IU] via ORAL
  Filled 2023-11-11 (×2): qty 1

## 2023-11-11 MED ORDER — ONDANSETRON HCL 4 MG/2ML IJ SOLN: 4.0000 mg | Freq: Four times a day (QID) | INTRAMUSCULAR | Status: DC | PRN

## 2023-11-11 MED ORDER — POLYETHYLENE GLYCOL 3350 17 G PO PACK: 17.0000 g | PACK | Freq: Every day | ORAL | Status: DC | PRN

## 2023-11-11 MED ORDER — ASPIRIN 81 MG PO CHEW
81.0000 mg | CHEWABLE_TABLET | Freq: Every day | ORAL | Status: DC
  Administered 2023-11-11 – 2023-11-12 (×2): 81 mg via ORAL
  Filled 2023-11-11 (×4): qty 1

## 2023-11-11 MED ORDER — CALCIUM ACETATE (PHOS BINDER) 667 MG PO CAPS
667.0000 mg | ORAL_CAPSULE | Freq: Three times a day (TID) | ORAL | Status: DC
  Administered 2023-11-12 – 2023-11-13 (×4): 667 mg via ORAL
  Filled 2023-11-11 (×4): qty 1

## 2023-11-11 MED ORDER — INSULIN ASPART 100 UNIT/ML IJ SOLN: 0.0000 [IU] | Freq: Three times a day (TID) | INTRAMUSCULAR | Status: DC

## 2023-11-11 MED ORDER — SENNOSIDES-DOCUSATE SODIUM 8.6-50 MG PO TABS
2.0000 | ORAL_TABLET | Freq: Every day | ORAL | Status: DC
  Administered 2023-11-11 – 2023-11-13 (×3): 2 via ORAL
  Filled 2023-11-11 (×3): qty 2

## 2023-11-11 MED ORDER — SODIUM CHLORIDE 0.9 % IV SOLN: INTRAVENOUS | Status: DC

## 2023-11-11 MED ORDER — BISACODYL 10 MG RE SUPP: 10.0000 mg | Freq: Every day | RECTAL | Status: DC | PRN

## 2023-11-11 MED ORDER — EZETIMIBE 10 MG PO TABS
10.0000 mg | ORAL_TABLET | Freq: Every evening | ORAL | Status: DC
  Administered 2023-11-12: 10 mg via ORAL
  Filled 2023-11-11: qty 1

## 2023-11-11 MED ORDER — POLYETHYLENE GLYCOL 3350 17 G PO PACK
17.0000 g | PACK | Freq: Two times a day (BID) | ORAL | Status: DC
  Administered 2023-11-11 – 2023-11-13 (×4): 17 g via ORAL
  Filled 2023-11-11 (×4): qty 1

## 2023-11-11 MED ORDER — MEMANTINE HCL 10 MG PO TABS: 10.0000 mg | ORAL_TABLET | Freq: Two times a day (BID) | ORAL | Status: DC

## 2023-11-11 MED ORDER — ONDANSETRON HCL 4 MG PO TABS: 4.0000 mg | ORAL_TABLET | Freq: Four times a day (QID) | ORAL | Status: DC | PRN

## 2023-11-11 MED ORDER — HYDROMORPHONE HCL 1 MG/ML IJ SOLN: 0.5000 mg | INTRAMUSCULAR | Status: DC | PRN

## 2023-11-11 MED ORDER — ONDANSETRON HCL 4 MG/2ML IJ SOLN: 4.0000 mg | Freq: Once | INTRAMUSCULAR | Status: DC

## 2023-11-11 MED ORDER — INSULIN ASPART FLEXPEN 100 UNIT/ML ~~LOC~~ SOPN: 2.0000 [IU] | PEN_INJECTOR | SUBCUTANEOUS | Status: DC

## 2023-11-11 MED ORDER — CARVEDILOL 3.125 MG PO TABS
3.1250 mg | ORAL_TABLET | Freq: Two times a day (BID) | ORAL | Status: DC
  Administered 2023-11-12 – 2023-11-13 (×3): 3.125 mg via ORAL
  Filled 2023-11-11 (×4): qty 1

## 2023-11-11 MED ORDER — EPINEPHRINE 0.3 MG/0.3ML IJ SOAJ: 0.3000 mg | Freq: Once | INTRAMUSCULAR | Status: DC | PRN

## 2023-11-11 MED ORDER — CYANOCOBALAMIN 1000 MCG/ML IJ SOLN: 1000.0000 ug | INTRAMUSCULAR | Status: DC

## 2023-11-11 MED ORDER — FINASTERIDE 5 MG PO TABS
5.0000 mg | ORAL_TABLET | Freq: Every day | ORAL | Status: DC
  Administered 2023-11-12 – 2023-11-13 (×2): 5 mg via ORAL
  Filled 2023-11-11 (×2): qty 1

## 2023-11-11 MED ORDER — FOLIC ACID 1 MG PO TABS
2.0000 mg | ORAL_TABLET | Freq: Every day | ORAL | Status: DC
  Administered 2023-11-12 – 2023-11-13 (×2): 2 mg via ORAL
  Filled 2023-11-11 (×2): qty 2

## 2023-11-11 NOTE — ED Provider Notes (Signed)
 Care of the patient assumed at signout.  I discussed the patient's case with our surgery team and they are following as a consulting service.  MRCP ordered given the patient's abnormal ultrasound.  Patient will be admitted to our internal medicine colleagues.   Dorenda Gandy, MD 11/11/23 [phone]

## 2023-11-11 NOTE — ED Triage Notes (Signed)
 PT BIB EMS from the dialysis center, he did not have dialysis, he vomited twice in the lobby.    BP 176/83 HR 75 ON pads, not pacing

## 2023-11-11 NOTE — H&P (Signed)
 History and Physical    Patient: Gregory Hood:[id number] DOB: 1951-04-08 DOA: 11/11/2023 DOS: the patient was seen and examined on 11/11/2023 PCP: Edda Goo, MD  Patient coming from: Home  Chief Complaint:  Chief Complaint  Patient presents with   Chest Pain   HPI: Gregory Hood is a 73 y.o. male with medical history significant of DM II, multiple CVA, dementia, learning disabilities, HTN, hyperlipidemia, multiple CVA, NSVT, pacemaker, SSS, Chronic diastolic heart failure, anemia of chronic disease, ESRD on HD. The patient was brought to [facility] ED on 11/11/2023 due to nausea and vomiting and abdominal pain for 2 days. The patient denies fever. There is no other family available for history. The patient seems to be an unreliable historian and answers almost all questions with "I can't say", or doesn't answer at all. He states  that he does not take any medications at home, although we have a list of things that he supposedly are taking. When asked why he is taking, he states that he has no idea.  The patient has been evaluated by general surgery. Right upper quadrant ultrasound is demonstrating  gallbladder wall thickening without cholelithiasis. This may represent acalculous cholecystitis or be secondary to adjacent hepatocellular disease or cirrhosis. Recommendation is for HIDA scan which has been ordered.   Of note, in their consult General Surgery has stated that if the patient needs surgery, the family wants him to go to [facility] where he gets all of his medical care.  The patient's wife has contacted nursing and told them to give the patient a sitter for safety as the patient is confused and may pull lines, etc.  Review of Systems: unable to review all systems due to the inability of the patient to answer questions. Past Medical History:  Diagnosis Date   Chronic kidney disease    COPD (chronic obstructive pulmonary disease) (HCC)    Diabetes mellitus without complication (HCC)     Diabetic retinopathy (HCC)    Hyperlipidemia    Hypertension    Obesity    Orthostatic hypotension    OSA on CPAP    Retinal detachment    Seizures (HCC)    Stroke (HCC)    TIA (transient ischemic attack)    Umbilical hernia    Past Surgical History:  Procedure Laterality Date   BASCILIC VEIN TRANSPOSITION Left 03/06/2015   Procedure: LET BASCILIC VEIN TRANSPOSITION;  Surgeon: Dannis Dy, MD;  Location: [facility] OR;  Service: Vascular;  Laterality: Left;   CIRCUMCISION     EYE SURGERY Bilateral    had surgery several different times.   HAMMER TOE SURGERY Left 30 years ago   small toe left toe   HERNIA REPAIR     IR AV DIALY SHUNT INTRO NEEDLE/INTRACATH INITIAL W/PTA/IMG LEFT  07/20/2018   SEPTOPLASTY  20 years ago   Social History:  reports that he quit smoking about 49 years ago. His smoking use included cigarettes. He has never used smokeless tobacco. He reports that he does not drink alcohol and does not use drugs.  Allergies  Allergen Reactions   Bee Venom Swelling   Lexiscan  [Regadenoson ] Other (See Comments)    Seizure like-activity after lexiscan  given.   Statins     Other reaction(s): Other (See Comments) Myalgias and CK increase.  Tried both atorvastatin  and rosuvastatin    Ace Inhibitors Cough    Family History  Problem Relation Age of Onset   Hypertension Mother    Diabetes type II Mother  Transient ischemic attack Mother    Kidney disease Mother    Diabetes Mother    Varicose Veins Mother    Hypertension Father    Diabetes type II Father    Diabetes Father    Diabetes type II Sister    Hypertension Sister    Diabetes Sister    Learning disabilities Maternal Uncle     Prior to Admission medications   Medication Sig Start Date End Date Taking? Authorizing Provider  acetaminophen  (TYLENOL ) 500 MG tablet Take 500 mg by mouth daily.    [provider]  aspirin  81 MG tablet Take 1 tablet (81 mg total) by mouth at bedtime. 12/24/20    Hongalgi, Anand D, MD  bisacodyl  (DULCOLAX) 10 MG suppository Place 10 mg rectally as needed for moderate constipation.    [provider]  calcium  acetate (PHOSLO ) 667 MG capsule Take 667 mg by mouth 3 (three) times daily. 08/29/20   [provider]  carvedilol  (COREG ) 3.125 MG tablet Take 1 tablet (3.125 mg total) by mouth 2 (two) times daily with a meal. Take after dialysis on dialysis days 10/11/20   Rizwan, Saima, MD  Cholecalciferol  50 MCG (2000 UT) TABS Take 0.5 tablets (1,000 Units total) by mouth daily. 12/24/20   Hongalgi, Anand D, MD  clopidogrel  (PLAVIX ) 75 MG tablet Take 75 mg by mouth daily. 03/10/18   [provider]  cyanocobalamin  (,VITAMIN B-12,) 1000 MCG/ML injection Inject 1,000 mcg into the muscle every 30 (thirty) days. On the 26th    [provider]  Dextrose , Diabetic Use, (GLUCOSE) 77.4 % GEL Take 1 Dose by mouth as needed (blood glucose less than 70).    [provider]  EPINEPHrine 0.3 mg/0.3 mL IJ SOAJ injection Inject 0.3 mg into the muscle once as needed for anaphylaxis. 10/12/20   [provider]  ezetimibe  (ZETIA ) 10 MG tablet Take 10 mg by mouth every evening. 04/22/19   [provider]  finasteride  (PROSCAR ) 5 MG tablet Take 5 mg by mouth daily. 03/26/19   [provider]  folic acid  (FOLVITE ) 1 MG tablet Take 2 tablets (2 mg total) by mouth daily. 04/03/14   Nishan, Peter C, MD  Glucagon, rDNA, (GLUCAGON EMERGENCY IJ) Inject 1 mg into the muscle as needed (blood sugar less than 70).    [provider]  Insulin  Aspart FlexPen 100 UNIT/ML SOPN Inject 2-10 Units into the skin See admin instructions. Bid per sliding scale Less than 150= 0 units, 151-200= 2 units, 201-250=4 units, 251-300= 6 units, 301-350= 8 units, 351-400= 10 units, if less than 70 or over 401 call md 12/16/20   [provider]  memantine  (NAMENDA ) 10 MG tablet Take 10 mg by mouth 2 (two) times daily.    [provider]  multivitamin (RENA-VIT) TABS tablet Take 1 tablet by mouth daily.    [provider]  niacin  100 MG tablet Take 200 mg by mouth every evening.    [provider]  ondansetron  (ZOFRAN -ODT) 4 MG disintegrating tablet Take 4 mg by mouth every 4 (four) hours as needed for nausea or vomiting.    [provider]  OVER THE COUNTER MEDICATION Take 1 Dose by mouth every evening. Yogurt for weight loss    [provider]  polyethylene glycol (MIRALAX  / GLYCOLAX ) packet Take 17 g by mouth 2 (two) times daily. 03/30/18   Hongalgi, Anand D, MD  prednisoLONE  acetate (PRED FORTE ) 1 % ophthalmic suspension Place 1 drop into the left  eye at bedtime. Patient taking differently: Place 1 drop into the left eye 4 (four) times daily. 07/18/18   Lesa Rape, MD  Red Yeast Rice 600 MG CAPS Take 1,200 mg by mouth at bedtime.    [provider]  senna-docusate (SENOKOT-S) 8.6-50 MG tablet Take 2 tablets by mouth daily. 07/19/18   Lesa Rape, MD  Sodium Phosphates (FLEET ENEMA RE) Place 1 Dose rectally as needed (constipation).    [provider]    Physical Exam: Vitals:   11/11/23 1202 11/11/23 1545 11/11/23 1718 11/11/23 1826  BP:  110/69 132/82   Pulse:  70 69   Resp:  17 15   Temp: 98 F (36.7 C)   97.9 F (36.6 C)  TempSrc: Oral     SpO2:  100% 100%    Exam:  Constitutional:  The patient is awake, alert, and oriented x 1. He does not seem to know where he is or the date. No acute distress. Eyes:  pupils and irises appear normal Normal lids and conjunctivae He keeps his left eye closed.  ENMT:  Possibly hard of hearing.  Lips appear normal external ears, nose appear normal Patient will not open his mouth to allow exam of oropharynx. Neck:  neck appears normal, no masses, normal ROM, supple no thyromegaly Respiratory:  No increased work of breathing. No wheezes, rales, or rhonchi No tactile fremitus Cardiovascular:  Regular rate  and rhythm No murmurs, ectopy, or gallups. No lateral PMI. No thrills. Abdomen:  Abdomen is soft, non-distended The patient grimaces notably with palpation of his epigastrum and right upper quadrant, but answers "no" when asked it that hurts. No hernias, masses, or organomegaly Normoactive bowel sounds.  Musculoskeletal:  No cyanosis, clubbing, or edema Skin:  No rashes, lesions, ulcers palpation of skin: no induration or nodules Neurologic:  The patient is unable to cooperate with exam. Psychiatric:  The patient is unable to cooperate with exam.  Data Reviewed:  CMP CBC CT Abdomen and pelvis Abdominal ultrasound  Assessment and Plan: Acute Cholecystitis: Per surgery. Although CT does look like acute cholecystitis, he does not have any bile duct dilatation or elevation of LFT's. Awaiting the results of HIDA. The patient states that he hasn't been taking his Plavix . He doesn't know for how long, or why he isn't taking that or any of his meds. Hard to know if he knows what he is talking about. He does appear to have pain on exam, although he denies it. He will be kept NPO due to reports of vomiting, and HIDA scan in the morning and possible surgery. He will be given IV fluids at 50 cc/hr.  Dementia: The patient is on namenda  at home. His wife has requested a sitter for safety as the patient is confused and will likely pull lines. I have asked for one.  NSVT/SSS The patient is on carvedilol . Will monitor him on telemetry. The patient has a pacemaker. Heart rate has ranged from 46 to 89.   Hypertension:  Continue carvedilol . The patient is currently normotensive.   DM II The patient reports that he doesn't take anything for his diabetes at home. It appears that at one point he had been on a low dose sliding scale of aspart at home. He will be places on low dose SSI here. Will check HbA1c.  ESRD on HD: The patient arrived at HD today and was sent to the ED due to vomiting. He is  metabolically stable for now. Will consult nephrology in  the am.   Chronic Diastolic Heart failure: Appears well compensated. Monitor I's and O's.  Anemia of Chronic (Renal) Disease:  Hemoglobin appears stable at 11.5. Monitor.  History of multiple CVA: It appears that the patient should be on plavix  at home, although the patient states that he is not taking it. In any case I have not continued it as inpatient unless a surgical procedure is planned.   Advance Care Planning:   Code Status: Full Code   Consults: General Surgery  Family Communication: None available  Severity of Illness: The appropriate patient status for this patient is INPATIENT. Inpatient status is judged to be reasonable and necessary in order to provide the required intensity of service to ensure the patient's safety. The patient's presenting symptoms, physical exam findings, and initial radiographic and laboratory data in the context of their chronic comorbidities is felt to place them at high risk for further clinical deterioration. Furthermore, it is not anticipated that the patient will be medically stable for discharge from the hospital within 2 midnights of admission.   * I certify that at the point of admission it is my clinical judgment that the patient will require inpatient hospital care spanning beyond 2 midnights from the point of admission due to high intensity of service, high risk for further deterioration and high frequency of surveillance required.*  Author: Eoin Willden, DO 11/11/2023 6:56 PM  For on call review [email or web address].

## 2023-11-11 NOTE — Progress Notes (Signed)
 Pt rec'd to room [facility] 18 @ 1922. Pt is alert x2 (name, place). Transferred from  stretcher to the bed with 3 staff members. Piv intact to rt forearm. Dialysis fistula located lt upper arm, sr x3 elevated. Be in low position. Call light in reach, and bed alarm activated.

## 2023-11-11 NOTE — ED Notes (Signed)
 5C called and notified that pt was on the way

## 2023-11-11 NOTE — Consult Note (Addendum)
 Gregory Hood 12/06/1950  [id number].    Requesting MD: Liam Redhead, MD Chief Complaint/Reason for Consult: possible acalculous cholecystitis   HPI:  Gregory [place]. Hood is a 73 y/o M with MMP who presents to the ED from dialysis due to abdominal pain, chest pain, and vomiting. Patient is not talking much and, per his wife at the bedside, he may not be an accurate historian due to recent decline in cognitive status/possible dementia. They both tell me that he has had intermittent abdominal pain and vomiting for "a while" worse in the morning. Denies abdominal pain after meals. He denies changes in bowel habits. Denies fever, chills, sick contacts, blood in his stool. Wife tells me that the patient received almost all of his medical care in  at [facility] (eye center, cardiology for history of pacemaker, neurology for history of multiple strokes, general surgery for ventral and inguinal hernia repairs). Wife tells me that for his hernia surgeries they had multiple physicians operating at once and gave him "light" anesthesia. Per chart review patient is on 81 mg ASA.   Per chart review the patient has had multiple hernia repairs by Dr. Brunilda Capra at [facility] (umbilical and right inguinal hernia repair, then later left injuinal hernia repair)  ROS: Review of Systems  All other systems reviewed and are negative.   Family History  Problem Relation Age of Onset   Hypertension Mother    Diabetes type II Mother    Transient ischemic attack Mother    Kidney disease Mother    Diabetes Mother    Varicose Veins Mother    Hypertension Father    Diabetes type II Father    Diabetes Father    Diabetes type II Sister    Hypertension Sister    Diabetes Sister    Learning disabilities Maternal Uncle     Past Medical History:  Diagnosis Date   Chronic kidney disease    COPD (chronic obstructive pulmonary disease) (HCC)    Diabetes mellitus without complication (HCC)    Diabetic retinopathy  (HCC)    Hyperlipidemia    Hypertension    Obesity    Orthostatic hypotension    OSA on CPAP    Retinal detachment    Seizures (HCC)    Stroke (HCC)    TIA (transient ischemic attack)    Umbilical hernia     Past Surgical History:  Procedure Laterality Date   BASCILIC VEIN TRANSPOSITION Left 03/06/2015   Procedure: LET BASCILIC VEIN TRANSPOSITION;  Surgeon: Dannis Dy, MD;  Location: [facility] OR;  Service: Vascular;  Laterality: Left;   CIRCUMCISION     EYE SURGERY Bilateral    had surgery several different times.   HAMMER TOE SURGERY Left 30 years ago   small toe left toe   HERNIA REPAIR     IR AV DIALY SHUNT INTRO NEEDLE/INTRACATH INITIAL W/PTA/IMG LEFT  07/20/2018   SEPTOPLASTY  20 years ago    Social History:  reports that he quit smoking about 49 years ago. His smoking use included cigarettes. He has never used smokeless tobacco. He reports that he does not drink alcohol and does not use drugs.  Allergies:  Allergies  Allergen Reactions   Bee Venom Swelling   Lexiscan  [Regadenoson ] Other (See Comments)    Seizure like-activity after lexiscan  given.   Statins     Other reaction(s): Other (See Comments) Myalgias and CK increase.  Tried both atorvastatin  and rosuvastatin    Ace Inhibitors Cough    (  Not in a hospital admission)    Physical Exam: Blood pressure 129/68, pulse 70, temperature 98 F (36.7 C), temperature source Oral, resp. rate 11, SpO2 100%. General: chronically ill appearing black male in NAD HEENT: head -normocephalic, atraumatic; Eyes: PERRLA, no conjunctival injection, anicteric sclerae Neck- Trachea is midline CV- RRR, L>R lower extremity edema  Pulm- breathing is non-labored Abd- soft,  NT/ND, appropriate bowel sounds in 4 quadrants, no masses, hernias. GU- deferred  MSK- UE/LE symmetrical, no cyanosis, clubbing, or edema. Neuro- CN II-XII grossly in tact, no paresthesias. Psych- Alert and Oriented to self Skin: warm and dry, no rashes  or lesions   Results for orders placed or performed during the hospital encounter of 11/11/23 (from the past 48 hours)  Comprehensive metabolic panel     Status: Abnormal   Collection Time: 11/11/23  1:49 PM  Result Value Ref Range   Sodium 137 135 - 145 mmol/L   Potassium 4.2 3.5 - 5.1 mmol/L   Chloride 93 (L) 98 - 111 mmol/L   CO2 29 22 - 32 mmol/L   Glucose, Bld 84 70 - 99 mg/dL    Comment: Glucose reference range applies only to samples taken after fasting for at least 8 hours.   BUN 21 8 - 23 mg/dL   Creatinine, Ser 1.61 (H) 0.61 - 1.24 mg/dL   Calcium  7.8 (L) 8.9 - 10.3 mg/dL   Total Protein 7.1 6.5 - 8.1 g/dL   Albumin 3.5 3.5 - 5.0 g/dL   AST 27 15 - 41 U/L   ALT 13 0 - 44 U/L   Alkaline Phosphatase 69 38 - 126 U/L   Total Bilirubin 0.8 0.0 - 1.2 mg/dL   GFR, Estimated 7 (L) >60 mL/min    Comment: (NOTE) Calculated using the CKD-EPI Creatinine Equation (2021)    Anion gap 15 5 - 15    Comment: Performed at [facility], [address], [place], [place] [address]  CBC with Differential     Status: Abnormal   Collection Time: 11/11/23  1:49 PM  Result Value Ref Range   WBC 4.9 4.0 - 10.5 K/uL   RBC 3.98 (L) 4.22 - 5.81 MIL/uL   Hemoglobin 11.5 (L) 13.0 - 17.0 g/dL   HCT 54.0 (L) 98.1 - 19.1 %   MCV 92.7 80.0 - 100.0 fL   MCH 28.9 26.0 - 34.0 pg   MCHC 31.2 30.0 - 36.0 g/dL   RDW 47.8 (H) 29.5 - 62.1 %   Platelets 139 (L) 150 - 400 K/uL   nRBC 0.0 0.0 - 0.2 %   Neutrophils Relative % 78 %   Neutro Abs 3.8 1.7 - 7.7 K/uL   Lymphocytes Relative 9 %   Lymphs Abs 0.4 (L) 0.7 - 4.0 K/uL   Monocytes Relative 10 %   Monocytes Absolute 0.5 0.1 - 1.0 K/uL   Eosinophils Relative 3 %   Eosinophils Absolute 0.1 0.0 - 0.5 K/uL   Basophils Relative 0 %   Basophils Absolute 0.0 0.0 - 0.1 K/uL   Immature Granulocytes 0 %   Abs Immature Granulocytes 0.01 0.00 - 0.07 K/uL    Comment: Performed at [facility], [address], [place], [place] [address]  Troponin I  (High Sensitivity)     Status: Abnormal   Collection Time: 11/11/23  1:49 PM  Result Value Ref Range   Troponin I (High Sensitivity) 43 (H) <18 ng/L    Comment: (NOTE) Elevated high sensitivity troponin I (hsTnI)  values and significant  changes across serial measurements may suggest ACS but many other  chronic and acute conditions are known to elevate hsTnI results.  Refer to the "Links" section for chest pain algorithms and additional  guidance. Performed at [facility], [address] [place], [place] [address]    US  Abdomen Limited RUQ (LIVER/GB) Result Date: 11/11/2023 CLINICAL DATA:  Abdominal pain. EXAM: ULTRASOUND ABDOMEN LIMITED RIGHT UPPER QUADRANT COMPARISON:  CT scan of same day. FINDINGS: Gallbladder: No cholelithiasis or sonographic Murphy's sign is noted. Moderate gallbladder wall thickening is noted at 6 mm. Common bile duct: Diameter: 6 mm which is within normal limits. Liver: Nodular hepatic contours are noted suggesting hepatic cirrhosis. No definite focal abnormality is noted. Portal vein is patent on color Doppler imaging with normal direction of blood flow towards the liver. Other: None. IMPRESSION: Moderate gallbladder wall thickening is noted without cholelithiasis. Potentially this may represent acalculous cholecystitis or be secondary to adjacent hepatocellular disease or cirrhosis. HIDA scan may be performed for further evaluation. Electronically Signed   By: Rosalene Colon M.D.   On: 11/11/2023 15:21   CT ABDOMEN PELVIS WO CONTRAST Result Date: 11/11/2023 CLINICAL DATA:  Bowel obstruction suspected EXAM: CT ABDOMEN AND PELVIS WITHOUT CONTRAST TECHNIQUE: Multidetector CT imaging of the abdomen and pelvis was performed following the standard protocol without IV contrast. RADIATION DOSE REDUCTION: This exam was performed according to the departmental dose-optimization program which includes automated exposure control, adjustment of the mA and/or kV according to  patient size and/or use of iterative reconstruction technique. COMPARISON:  CT of the chest abdomen and pelvis performed March 09, 2020 FINDINGS: Lower chest: Enlarged heart.  Trace left pleural effusion. Hepatobiliary: Motion related changes in the upper abdomen. The gallbladder is contracted and demonstrates diffuse wall thickening. Possible pericholecystic fluid. Small volume perihepatic ascites. Pancreas: Unremarkable. No pancreatic ductal dilatation or surrounding inflammatory changes. Spleen: Normal in size without focal abnormality. Adrenals/Urinary Tract: Cortical thinning is present in both kidneys. Cystic changes are present bilaterally with the largest cyst in the right kidney estimated at 2.3 cm. The urinary bladder is contracted and demonstrates diffuse wall thickening. Stomach/Bowel: The stomach is nondilated. No dilated loops of small bowel are appreciated. No evidence of appendicitis. Vascular/Lymphatic: Extensive vascular calcifications are present. No evidence of significant lymphadenopathy. Reproductive: Nothing significant. Other: Diastasis of the ventral abdominal wall. Musculoskeletal: Degenerative changes in the imaged osseous structures. IMPRESSION: 1. Gallbladder wall thickening with possible cholecystic fluid. Consider dedicated right upper quadrant ultrasound to evaluate for acute cholecystitis. 2. No evidence of bowel obstruction. 3. Renal atrophy with contracted urinary bladder with diffuse uniform wall thickening. This may represent sequelae of chronic renal disease. Electronically Signed   By: Reagan Camera M.D.   On: 11/11/2023 13:24   DG Chest Portable 1 View Result Date: 11/11/2023 CLINICAL DATA:  Cough and chest pain EXAM: PORTABLE CHEST 1 VIEW COMPARISON:  X-ray 12/22/2020 and older FINDINGS: Right upper chest battery pack with pacemaker leads overlying the right side of the heart. Similar configuration to previous. Enlarged cardiopericardial silhouette with calcified aorta.  Overlapping cardiac leads. Film is rotated and tilted. The left inferior costophrenic angle is clipped off the edge of the film. There is some increasing left retrocardiac opacity. Subtle infiltrate is possible. No pneumothorax or edema. IMPRESSION: Enlarged heart.  Pacemaker. Left retrocardiac opacity. Subtle infiltrate is possible. Recommend follow-up. Electronically Signed   By: Adrianna Horde M.D.   On: 11/11/2023 12:08      Assessment/Plan  Dewey Cordrey is a 73 y/o M with multiple medical problems who presents from dialysis with cc acute on chronic abdominal pain with vomiting. CT of the abdomen and RUQ ultrasound question acalculous cholecystitis. He is afebrile, normal WBC, LFT's WNL, and non-tender on abdominal exam. Will order HIDA scan to exclude cholecystitis.  No emergent surgical needs. No convincing evidence of cholecystitis clinically. The patients wife states that would like him to have any surgeries at [facility] if he were to need surgery.   Wife gave me the following phone numbers:  Wife, Gregory Hood [phone] Gregory Hood [phone]    SSS s/p pacemaker ESRD on HD HTN DM Multiple strokes Depression HLD cataracts  I reviewed nursing notes, ED provider notes, last 24 h vitals and pain scores, last 48 h intake and output, last 24 h labs and trends, and last 24 h imaging results.  Charlott Converse, PA-C [facility] 11/11/2023, 4:30 PM Please see Amion for pager number during day hours 7:00am-4:30pm or 7:00am -11:30am on weekends

## 2023-11-11 NOTE — ED Provider Notes (Signed)
 [facility] AT [facility] Provider Note  CSN: [id number] Arrival date & time: 11/11/23 1133  Chief Complaint(s) Chest Pain  HPI [facility] is a 73 y.o. male coming to us  from dialysis.  Patient reportedly rather dialysis, was complaining of chest pain and he vomited 2 times.  When [facility] arrived, they were concerned that the patient's heart rate was variable.  Patient has a dual-chamber [employer] pacemaker.   Past Medical History Past Medical History:  Diagnosis Date   Chronic kidney disease    COPD (chronic obstructive pulmonary disease) (HCC)    Diabetes mellitus without complication (HCC)    Diabetic retinopathy (HCC)    Hyperlipidemia    Hypertension    Obesity    Orthostatic hypotension    OSA on CPAP    Retinal detachment    Seizures (HCC)    Stroke (HCC)    TIA (transient ischemic attack)    Umbilical hernia    Patient Active Problem List   Diagnosis Date Noted   Wide-complex tachycardia 10/08/2020   Syncope 10/08/2020   History of completed stroke 07/11/2018   Acute encephalopathy 07/11/2018   Cellulitis of right leg 07/11/2018   Hypertensive urgency 07/11/2018   Acute exacerbation of CHF (congestive heart failure) (HCC) 03/22/2018   CVA (cerebral vascular accident) (HCC) 03/22/2018   OSA (obstructive sleep apnea) 03/22/2018   BPH (benign prostatic hyperplasia) 03/22/2018   Thrombocytopenia (HCC) 03/22/2018   Normocytic anemia 03/22/2018   Metabolic acidosis 03/22/2018   Uremia 03/22/2018   End stage renal disease (HCC) 03/14/2014   Orthostatic hypotension 02/22/2014   Elevated troponin 02/22/2014   Chronic diastolic heart failure (HCC) 02/22/2014   Elevated lipids 02/22/2014   Essential hypertension, benign 02/17/2014   DM (diabetes mellitus), 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  Authorizing Provider  acetaminophen  (TYLENOL ) 500 MG tablet Take 500 mg by mouth daily.    [provider]  aspirin  81 MG tablet Take 1 tablet (81 mg total) by mouth at bedtime. 12/24/20   Hongalgi, Anand D, MD  bisacodyl  (DULCOLAX) 10 MG suppository Place 10 mg rectally as needed for moderate constipation.    [provider]  calcium  acetate (PHOSLO ) 667 MG capsule Take 667 mg by mouth 3 (three) times daily. 08/29/20   [provider]  carvedilol  (COREG ) 3.125 MG tablet Take 1 tablet (3.125 mg total) by mouth 2 (two) times daily with a meal. Take after dialysis on dialysis days 10/11/20   Rizwan, Saima, MD  Cholecalciferol  50 MCG (2000 UT) TABS Take 0.5 tablets (1,000 Units total) by mouth daily. 12/24/20   Hongalgi, Anand D, MD  clopidogrel  (PLAVIX ) 75 MG tablet Take 75 mg by mouth daily. 03/10/18   [provider]  cyanocobalamin  (,VITAMIN B-12,) 1000 MCG/ML injection Inject 1,000 mcg into the muscle every 30 (thirty) days. On the 26th    [provider]  Dextrose , Diabetic Use, (GLUCOSE) 77.4 % GEL Take 1 Dose by mouth as needed (blood glucose less than 70).    [provider]  EPINEPHrine 0.3 mg/0.3 mL IJ SOAJ injection Inject 0.3 mg into the muscle once as needed for anaphylaxis. 10/12/20   [provider]  ezetimibe  (ZETIA ) 10 MG tablet Take 10 mg by mouth every evening. 04/22/19   [provider]  finasteride  (PROSCAR ) 5 MG tablet Take 5 mg by mouth daily.  03/26/19   [provider]  folic acid  (FOLVITE ) 1 MG tablet Take 2 tablets (2 mg total) by mouth daily. 04/03/14   Nishan, Peter C, MD  Glucagon, rDNA, (GLUCAGON EMERGENCY IJ) Inject 1 mg into the muscle as needed (blood sugar less than 70).    [provider]  Insulin  Aspart FlexPen 100 UNIT/ML SOPN Inject 2-10 Units into the skin See admin instructions. Bid per sliding scale Less than 150= 0 units, 151-200= 2 units, 201-250=4 units, 251-300= 6 units, 301-350=  8 units, 351-400= 10 units, if less than 70 or over 401 call md 12/16/20   [provider]  memantine  (NAMENDA ) 10 MG tablet Take 10 mg by mouth 2 (two) times daily.    [provider]  multivitamin (RENA-VIT) TABS tablet Take 1 tablet by mouth daily.    [provider]  niacin  100 MG tablet Take 200 mg by mouth every evening.    [provider]  ondansetron  (ZOFRAN -ODT) 4 MG disintegrating tablet Take 4 mg by mouth every 4 (four) hours as needed for nausea or vomiting.    [provider]  OVER THE COUNTER MEDICATION Take 1 Dose by mouth every evening. Yogurt for weight loss    [provider]  polyethylene glycol (MIRALAX  / GLYCOLAX ) packet Take 17 g by mouth 2 (two) times daily. 03/30/18   Hongalgi, Anand D, MD  prednisoLONE  acetate (PRED FORTE ) 1 % ophthalmic suspension Place 1 drop into the left eye at bedtime. Patient taking differently: Place 1 drop into the left eye 4 (four) times daily. 07/18/18   Lesa Rape, MD  Red Yeast Rice 600 MG CAPS Take 1,200 mg by mouth at bedtime.    [provider]  senna-docusate (SENOKOT-S) 8.6-50 MG tablet Take 2 tablets by mouth daily. 07/19/18   Lesa Rape, MD  Sodium Phosphates (FLEET ENEMA RE) Place 1 Dose rectally as needed (constipation).    [provider]                                                                                                                                    Past Surgical History Past Surgical History:  Procedure Laterality Date   BASCILIC VEIN TRANSPOSITION Left 03/06/2015   Procedure: LET BASCILIC VEIN TRANSPOSITION;  Surgeon: Dannis Dy, MD;  Location: [facility] OR;  Service: Vascular;  Laterality: Left;   CIRCUMCISION     EYE SURGERY Bilateral    had surgery several different times.   HAMMER TOE SURGERY Left 30 years ago   small toe left toe   HERNIA REPAIR     IR AV DIALY SHUNT INTRO NEEDLE/INTRACATH INITIAL W/PTA/IMG LEFT  07/20/2018    SEPTOPLASTY  20 years ago   Family History Family History  Problem Relation Age of Onset   Hypertension Mother    Diabetes type II Mother    Transient ischemic attack Mother    Kidney disease Mother  Diabetes Mother    Varicose Veins Mother    Hypertension Father    Diabetes type II Father    Diabetes Father    Diabetes type II Sister    Hypertension Sister    Diabetes Sister    Learning disabilities Maternal Uncle     Social History Social History   Tobacco Use   Smoking status: Former    Current packs/day: 0.00    Types: Cigarettes    Quit date: 03/05/1974    Years since quitting: 49.7   Smokeless tobacco: Never  Vaping Use   Vaping status: Never Used  Substance Use Topics   Alcohol use: No    Alcohol/week: 0.0 standard drinks of alcohol   Drug use: No   Allergies Bee venom, Lexiscan  [regadenoson ], Statins, and Ace inhibitors  Review of Systems Review of Systems  Physical Exam Vital Signs  I have reviewed the triage vital signs BP 129/68 (BP Location: Right Arm)   Pulse 70   Temp 98 F (36.7 C) (Oral)   Resp 11   SpO2 100%   Physical Exam Vitals reviewed.  Cardiovascular:     Rate and Rhythm: Normal rate.  Neurological:     Mental Status: He is alert.     ED Results and Treatments Labs (all labs ordered are listed, but only abnormal results are displayed) Labs Reviewed  COMPREHENSIVE METABOLIC PANEL WITH GFR - Abnormal; Notable for the following components:      Result Value   Chloride 93 (*)    Creatinine, Ser 7.54 (*)    Calcium  7.8 (*)    GFR, Estimated 7 (*)    All other components within normal limits  CBC WITH DIFFERENTIAL/PLATELET - Abnormal; Notable for the following components:   RBC 3.98 (*)    Hemoglobin 11.5 (*)    HCT 36.9 (*)    RDW 15.7 (*)    Platelets 139 (*)    Lymphs Abs 0.4 (*)    All other components within normal limits  TROPONIN I (HIGH SENSITIVITY) - Abnormal; Notable for the following components:   Troponin I  (High Sensitivity) 43 (*)    All other components within normal limits  TROPONIN I (HIGH SENSITIVITY)                                                                                                                          Radiology US  Abdomen Limited RUQ (LIVER/GB) Result Date: 11/11/2023 CLINICAL DATA:  Abdominal pain. EXAM: ULTRASOUND ABDOMEN LIMITED RIGHT UPPER QUADRANT COMPARISON:  CT scan of same day. FINDINGS: Gallbladder: No cholelithiasis or sonographic Murphy's sign is noted. Moderate gallbladder wall thickening is noted at 6 mm. Common bile duct: Diameter: 6 mm which is within normal limits. Liver: Nodular hepatic contours are noted suggesting hepatic cirrhosis. No definite focal abnormality is noted. Portal vein is patent on color Doppler imaging with normal direction of blood flow towards the liver. Other: [place]. IMPRESSION: Moderate gallbladder wall thickening is noted without  cholelithiasis. Potentially this may represent acalculous cholecystitis or be secondary to adjacent hepatocellular disease or cirrhosis. HIDA scan may be performed for further evaluation. Electronically Signed   By: Rosalene Colon M.D.   On: 11/11/2023 15:21   CT ABDOMEN PELVIS WO CONTRAST Result Date: 11/11/2023 CLINICAL DATA:  Bowel obstruction suspected EXAM: CT ABDOMEN AND PELVIS WITHOUT CONTRAST TECHNIQUE: Multidetector CT imaging of the abdomen and pelvis was performed following the standard protocol without IV contrast. RADIATION DOSE REDUCTION: This exam was performed according to the departmental dose-optimization program which includes automated exposure control, adjustment of the mA and/or kV according to patient size and/or use of iterative reconstruction technique. COMPARISON:  CT of the chest abdomen and pelvis performed March 09, 2020 FINDINGS: Lower chest: Enlarged heart.  Trace left pleural effusion. Hepatobiliary: Motion related changes in the upper abdomen. The gallbladder is contracted and  demonstrates diffuse wall thickening. Possible pericholecystic fluid. Small volume perihepatic ascites. Pancreas: Unremarkable. No pancreatic ductal dilatation or surrounding inflammatory changes. Spleen: Normal in size without focal abnormality. Adrenals/Urinary Tract: Cortical thinning is present in both kidneys. Cystic changes are present bilaterally with the largest cyst in the right kidney estimated at 2.3 cm. The urinary bladder is contracted and demonstrates diffuse wall thickening. Stomach/Bowel: The stomach is nondilated. No dilated loops of small bowel are appreciated. No evidence of appendicitis. Vascular/Lymphatic: Extensive vascular calcifications are present. No evidence of significant lymphadenopathy. Reproductive: Nothing significant. Other: Diastasis of the ventral abdominal wall. Musculoskeletal: Degenerative changes in the imaged osseous structures. IMPRESSION: 1. Gallbladder wall thickening with possible cholecystic fluid. Consider dedicated right upper quadrant ultrasound to evaluate for acute cholecystitis. 2. No evidence of bowel obstruction. 3. Renal atrophy with contracted urinary bladder with diffuse uniform wall thickening. This may represent sequelae of chronic renal disease. Electronically Signed   By: Reagan Camera M.D.   On: 11/11/2023 13:24   DG Chest Portable 1 View Result Date: 11/11/2023 CLINICAL DATA:  Cough and chest pain EXAM: PORTABLE CHEST 1 VIEW COMPARISON:  X-ray 12/22/2020 and older FINDINGS: Right upper chest battery pack with pacemaker leads overlying the right side of the heart. Similar configuration to previous. Enlarged cardiopericardial silhouette with calcified aorta. Overlapping cardiac leads. Film is rotated and tilted. The left inferior costophrenic angle is clipped off the edge of the film. There is some increasing left retrocardiac opacity. Subtle infiltrate is possible. No pneumothorax or edema. IMPRESSION: Enlarged heart.  Pacemaker. Left retrocardiac  opacity. Subtle infiltrate is possible. Recommend follow-up. Electronically Signed   By: Adrianna Horde M.D.   On: 11/11/2023 12:08    Pertinent labs & imaging results that were available during my care of the patient were reviewed by me and considered in my medical decision making (see MDM for details).  Medications Ordered in ED Medications  ondansetron  (ZOFRAN ) injection 4 mg (has no administration in time range)  Procedures Procedures  (including critical care time)  Medical Decision Making / ED Course   This patient presents to the ED for concern of vomiting, this involves an extensive number of treatment options, and is a complaint that carries with it a high risk of complications and morbidity.  The differential diagnosis includes pacemaker malfunction, abdominal pain, vomiting, cholecystitis, cholelithiasis.  MDM: Reviewed the rhythm strips from EMS, the patient does have some pauses were appears that his pacemaker may not have been sensing, however this does appear to be functioning properly here in the ED.  Will interrogate the patient's pacemaker.  Will obtain CT imaging the patient's abdomen pelvis given the vomiting.  Patient did not receive dialysis today, however does not appear to be fluid overloaded.  He has normal vital signs.  Reassessment 3:10 PM-still awaiting report from [employer] regarding the patient's pacemaker.  His CT imaging of his abdomen and pelvis showed possible cholecystitis.  Have ordered an ultrasound on the patient.  Patient will be signed out to Dr. Liam Redhead pending surgical consult and pacemaker interrogation.   Additional history obtained: -Additional history obtained from EMS -External records from outside source obtained and reviewed including: Chart review including previous notes, labs, imaging, consultation  notes   Lab Tests: -I ordered, reviewed, and interpreted labs.   The pertinent results include:   Labs Reviewed  COMPREHENSIVE METABOLIC PANEL WITH GFR - Abnormal; Notable for the following components:      Result Value   Chloride 93 (*)    Creatinine, Ser 7.54 (*)    Calcium  7.8 (*)    GFR, Estimated 7 (*)    All other components within normal limits  CBC WITH DIFFERENTIAL/PLATELET - Abnormal; Notable for the following components:   RBC 3.98 (*)    Hemoglobin 11.5 (*)    HCT 36.9 (*)    RDW 15.7 (*)    Platelets 139 (*)    Lymphs Abs 0.4 (*)    All other components within normal limits  TROPONIN I (HIGH SENSITIVITY) - Abnormal; Notable for the following components:   Troponin I (High Sensitivity) 43 (*)    All other components within normal limits  TROPONIN I (HIGH SENSITIVITY)      EKG paced rhythm  EKG Interpretation Date/Time:  Friday Nov 11 2023 13:09:31 EDT Ventricular Rate:  69 PR Interval:  173 QRS Duration:  181 QT Interval:  498 QTC Calculation: 534 R Axis:   -72  Text Interpretation: ATRIAL PACED RHYTHM with pauses Abnormal ECG Confirmed by Dorenda Gandy [phone]) on 11/11/2023 3:34:38 PM         Imaging Studies ordered: I ordered imaging studies including right upper quadrant ultrasound, chest x-ray I independently visualized and interpreted imaging. I agree with the radiologist interpretation   Medicines ordered and prescription drug management: Meds ordered this encounter  Medications   ondansetron  (ZOFRAN ) injection 4 mg    -I have reviewed the patients home medicines and have made adjustments as needed  Cardiac Monitoring: The patient was maintained on a cardiac monitor.  I personally viewed and interpreted the cardiac monitored which showed an underlying rhythm of: Paced rhythm  Social Determinants of Health:  Factors impacting patients care include: Multiple medical comorbidities including end-stage renal disease on  dialysis   Reevaluation: After the interventions noted above, I reevaluated the patient and found that they have :improved  Co morbidities that complicate the patient evaluation  Past Medical History:  Diagnosis Date   Chronic kidney disease    COPD (  chronic obstructive pulmonary disease) (HCC)    Diabetes mellitus without complication (HCC)    Diabetic retinopathy (HCC)    Hyperlipidemia    Hypertension    Obesity    Orthostatic hypotension    OSA on CPAP    Retinal detachment    Seizures (HCC)    Stroke (HCC)    TIA (transient ischemic attack)    Umbilical hernia          Final Clinical Impression(s) / ED Diagnoses Final diagnoses:  Vomiting without nausea, unspecified vomiting type     @PCDICTATION @    Afton Horse T, DO 11/11/23 1536

## 2023-11-11 NOTE — ED Notes (Signed)
 Shift report received, assumed care of patient at this time. Patient has ready bed upon assuming care of patient.

## 2023-11-12 ENCOUNTER — Encounter (HOSPITAL_COMMUNITY): Payer: Self-pay | Admitting: Internal Medicine

## 2023-11-12 DIAGNOSIS — K819 Cholecystitis, unspecified: Secondary | ICD-10-CM | POA: Diagnosis not present

## 2023-11-12 LAB — COMPREHENSIVE METABOLIC PANEL WITH GFR
ALT: 12 U/L (ref 0–44)
AST: 24 U/L (ref 15–41)
Albumin: 3.6 g/dL (ref 3.5–5.0)
Alkaline Phosphatase: 63 U/L (ref 38–126)
Anion gap: 17 — ABNORMAL HIGH (ref 5–15)
BUN: 29 mg/dL — ABNORMAL HIGH (ref 8–23)
CO2: 24 mmol/L (ref 22–32)
Calcium: 7.4 mg/dL — ABNORMAL LOW (ref 8.9–10.3)
Chloride: 95 mmol/L — ABNORMAL LOW (ref 98–111)
Creatinine, Ser: 9.03 mg/dL — ABNORMAL HIGH (ref 0.61–1.24)
GFR, Estimated: 6 mL/min — ABNORMAL LOW (ref >60)
Glucose, Bld: 123 mg/dL — ABNORMAL HIGH (ref 70–99)
Potassium: 4.3 mmol/L (ref 3.5–5.1)
Sodium: 136 mmol/L (ref 135–145)
Total Bilirubin: 0.8 mg/dL (ref 0.0–1.2)
Total Protein: 7.1 g/dL (ref 6.5–8.1)

## 2023-11-12 LAB — CBC
HCT: 38.4 % — ABNORMAL LOW (ref 39.0–52.0)
Hemoglobin: 12.1 g/dL — ABNORMAL LOW (ref 13.0–17.0)
MCH: 28.7 pg (ref 26.0–34.0)
MCHC: 31.5 g/dL (ref 30.0–36.0)
MCV: 91.2 fL (ref 80.0–100.0)
Platelets: 129 10*3/uL — ABNORMAL LOW (ref 150–400)
RBC: 4.21 MIL/uL — ABNORMAL LOW (ref 4.22–5.81)
RDW: 15.6 % — ABNORMAL HIGH (ref 11.5–15.5)
WBC: 6 10*3/uL (ref 4.0–10.5)
nRBC: 0 % (ref 0.0–0.2)

## 2023-11-12 LAB — HEPATITIS B SURFACE ANTIGEN: Hepatitis B Surface Ag: NONREACTIVE

## 2023-11-12 LAB — GLUCOSE, CAPILLARY
Glucose-Capillary: 108 mg/dL — ABNORMAL HIGH (ref 70–99)
Glucose-Capillary: 110 mg/dL — ABNORMAL HIGH (ref 70–99)
Glucose-Capillary: 123 mg/dL — ABNORMAL HIGH (ref 70–99)
Glucose-Capillary: 57 mg/dL — ABNORMAL LOW (ref 70–99)
Glucose-Capillary: 68 mg/dL — ABNORMAL LOW (ref 70–99)
Glucose-Capillary: 82 mg/dL (ref 70–99)
Glucose-Capillary: 91 mg/dL (ref 70–99)

## 2023-11-12 MED ORDER — HEPARIN SODIUM (PORCINE) 5000 UNIT/ML IJ SOLN
5000.0000 [IU] | Freq: Three times a day (TID) | INTRAMUSCULAR | Status: DC
  Administered 2023-11-12 – 2023-11-13 (×3): 5000 [IU] via SUBCUTANEOUS
  Filled 2023-11-12 (×3): qty 1

## 2023-11-12 MED ORDER — DEXTROSE-SODIUM CHLORIDE 5-0.9 % IV SOLN
INTRAVENOUS | Status: AC
Start: 2023-11-12 — End: 2023-11-13

## 2023-11-12 MED ORDER — CHLORHEXIDINE GLUCONATE CLOTH 2 % EX PADS
6.0000 | MEDICATED_PAD | Freq: Every day | CUTANEOUS | Status: DC
  Administered 2023-11-13: 6 via TOPICAL

## 2023-11-12 MED ORDER — DEXTROSE 50 % IV SOLN: 12.5000 g | INTRAVENOUS | Status: AC

## 2023-11-12 MED ORDER — OLANZAPINE 10 MG IM SOLR
5.0000 mg | Freq: Once | INTRAMUSCULAR | Status: DC | PRN
Start: 2023-11-12 — End: 2023-11-13
  Filled 2023-11-12: qty 10

## 2023-11-12 MED ORDER — DEXTROSE 50 % IV SOLN
INTRAVENOUS | Status: AC
  Administered 2023-11-12: 12.5 g via INTRAVENOUS
  Filled 2023-11-12: qty 50

## 2023-11-12 NOTE — Plan of Care (Signed)

## 2023-11-12 NOTE — Significant Event (Signed)
 Hypoglycemic Event  CBG: 57  Treatment: D50 25 mL (12.5 gm)  Symptoms: None  Follow-up CBG: Time:0410 CBG Result:91  Possible Reasons for Event: Inadequate meal intake  Comments/MD notified:pt placed on D5/ NS @ 40 and Q2hr blood sugars.    Al Hover

## 2023-11-12 NOTE — Consult Note (Signed)
 Renal Service Consult Note [facility]  Gregory Hood 11/12/2023 Lynae Sandifer, MD Requesting Physician: Dr. Rosaline Coma  Reason for Consult: ESRD pt w/ abd pain , N/V HPI: The patient is a 73 y.o. year-old w/ PMH as below who presented to ED. He was sent from HD unit. Pt reported abd pain and N/V for 2 days. Imaging showed gallbladder wall thickening w/o stones. Gen surgery saw pt and requested HIDA scan. Pt said if he needed surgery he would want to go to [facility] where he gets all his medical care. Pt was a vague historian. Pt was admitted and HIDA scan pending. We are asked to see for dialysis.    Pt seen in the hospital room.  Patient was alert and pleasant but did not really say much in response to questions.  Denies any chest pain or breathing troubles   ROS - poor historian  PMH: COPD ESRD on HD DM type 2 Diabetic retinopathy HL HTN Orthostatic hypotension OSA H/o CVA  Past Surgical History  Past Surgical History:  Procedure Laterality Date   BASCILIC VEIN TRANSPOSITION Left 03/06/2015   Procedure: LET BASCILIC VEIN TRANSPOSITION;  Surgeon: Dannis Dy, MD;  Location: [facility] OR;  Service: Vascular;  Laterality: Left;   CIRCUMCISION     EYE SURGERY Bilateral    had surgery several different times.   HAMMER TOE SURGERY Left 30 years ago   small toe left toe   HERNIA REPAIR     IR AV DIALY SHUNT INTRO NEEDLE/INTRACATH INITIAL W/PTA/IMG LEFT  07/20/2018   SEPTOPLASTY  20 years ago   Family History  Family History  Problem Relation Age of Onset   Hypertension Mother    Diabetes type II Mother    Transient ischemic attack Mother    Kidney disease Mother    Diabetes Mother    Varicose Veins Mother    Hypertension Father    Diabetes type II Father    Diabetes Father    Diabetes type II Sister    Hypertension Sister    Diabetes Sister    Learning disabilities Maternal Uncle    Social History  reports that he quit smoking about 49 years ago. His  smoking use included cigarettes. He has never used smokeless tobacco. He reports that he does not drink alcohol and does not use drugs. Allergies  Allergies  Allergen Reactions   Bee Venom Swelling   Lexiscan  [Regadenoson ] Other (See Comments)    Seizure like-activity after lexiscan  given.   Statins     Other reaction(s): Other (See Comments) Myalgias and CK increase.  Tried both atorvastatin  and rosuvastatin    Ace Inhibitors Cough   Home medications Prior to Admission medications   Medication Sig Start Date End Date Taking? Authorizing Provider  acetaminophen  (TYLENOL ) 500 MG tablet Take 500 mg by mouth daily.    [provider]  ascorbic acid (VITAMIN C) 500 MG tablet Take 1 tablet by mouth daily. 07/22/23   [provider]  aspirin  81 MG tablet Take 1 tablet (81 mg total) by mouth at bedtime. 12/24/20   Hongalgi, Anand D, MD  atropine  1 % ophthalmic solution Place 1 drop into both eyes. 10/25/23   [provider]  bisacodyl  (DULCOLAX) 10 MG suppository Place 10 mg rectally as needed for moderate constipation.    [provider]  calcium  acetate (PHOSLO ) 667 MG capsule Take 667 mg by mouth 3 (three) times daily. 08/29/20   [provider]  carvedilol  (COREG )  25 MG tablet Take 25 mg by mouth 2 (two) times daily with a meal. 05/07/23   [provider]  Cholecalciferol  50 MCG (2000 UT) TABS Take 0.5 tablets (1,000 Units total) by mouth daily. 12/24/20   Hongalgi, Anand D, MD  clopidogrel  (PLAVIX ) 75 MG tablet Take 75 mg by mouth daily. 03/10/18   [provider]  cyanocobalamin  (,VITAMIN B-12,) 1000 MCG/ML injection Inject 1,000 mcg into the muscle every 30 (thirty) days. On the 26th    [provider]  Dextrose , Diabetic Use, (GLUCOSE) 77.4 % GEL Take 1 Dose by mouth as needed (blood glucose less than 70).    [provider]  divalproex (DEPAKOTE) 125 MG DR tablet Take 125 mg by mouth.    [provider]   EPINEPHrine  0.3 mg/0.3 mL IJ SOAJ injection Inject 0.3 mg into the muscle once as needed for anaphylaxis. 10/12/20   [provider]  ezetimibe  (ZETIA ) 10 MG tablet Take 10 mg by mouth every evening. 04/22/19   [provider]  Ferrous Sulfate (IRON) 325 (65 Fe) MG TABS Take 1 tablet by mouth daily. 07/22/23   [provider]  finasteride  (PROSCAR ) 5 MG tablet Take 5 mg by mouth daily. 03/26/19   [provider]  folic acid  (FOLVITE ) 1 MG tablet Take 2 tablets (2 mg total) by mouth daily. 04/03/14   Nishan, Peter C, MD  furosemide  (LASIX ) 40 MG tablet Take 1 tablet by mouth daily. 05/04/23   [provider]  Glucagon, rDNA, (GLUCAGON EMERGENCY IJ) Inject 1 mg into the muscle as needed (blood sugar less than 70).    [provider]  Insulin  Aspart FlexPen 100 UNIT/ML SOPN Inject 2-10 Units into the skin See admin instructions. Bid per sliding scale Less than 150= 0 units, 151-200= 2 units, 201-250=4 units, 251-300= 6 units, 301-350= 8 units, 351-400= 10 units, if less than 70 or over 401 call md 12/16/20   [provider]  losartan (COZAAR) 50 MG tablet Take 50 mg by mouth daily. 08/27/22   [provider]  melatonin 3 MG TABS tablet Take 3 mg by mouth at bedtime. 05/04/23   [provider]  memantine  (NAMENDA ) 10 MG tablet Take 10 mg by mouth 2 (two) times daily.    [provider]  multivitamin (RENA-VIT) TABS tablet Take 1 tablet by mouth daily.    [provider]  niacin  100 MG tablet Take 200 mg by mouth every evening.    [provider]  ondansetron  (ZOFRAN -ODT) 4 MG disintegrating tablet Take 4 mg by mouth every 4 (four) hours as needed for nausea or vomiting.    [provider]  OVER THE COUNTER MEDICATION Take 1 Dose by mouth every evening. Yogurt for weight loss    [provider]  polyethylene glycol (MIRALAX  / GLYCOLAX ) packet Take 17 g by mouth 2 (two) times daily. 03/30/18    Hongalgi, Anand D, MD  Red Yeast Rice 600 MG CAPS Take 1,200 mg by mouth at bedtime.    [provider]  senna-docusate (SENOKOT-S) 8.6-50 MG tablet Take 2 tablets by mouth daily. 07/19/18   Lesa Rape, MD  Sodium Phosphates (FLEET ENEMA RE) Place 1 Dose rectally as needed (constipation).    [provider]  terbinafine (LAMISIL) 250 MG tablet Take 250 mg by mouth daily. 10/12/23   [provider]  traMADol  (ULTRAM ) 50 MG tablet Take 50 mg by mouth every 6 (six) hours as needed for moderate pain (pain score  4-6). 07/06/23   [provider]  traZODone (DESYREL) 150 MG tablet Take 150 mg by mouth at bedtime. 10/30/23   [provider]     Vitals:   11/11/23 1826 11/11/23 2200 11/12/23 0103 11/12/23 0458  BP:   128/82 (!) 133/93  Pulse:   72 75  Resp:   17 17  Temp: 97.9 F (36.6 C)  98.1 F (36.7 C) 97.7 F (36.5 C)  TempSrc:   Oral Oral  SpO2:   100% 100%  Weight:  89.7 kg    Height:  5\' 10"  (1.778 m)     Exam Gen alert, no distress No rash, cyanosis or gangrene Sclera anicteric, throat clear  No jvd or bruits Chest clear bilat to bases, no rales/ wheezing RRR no MRG Abd soft ntnd no mass or ascites +bs GU deferred MS no joint effusions or deformity Ext no LE or UE edema, no other edema Neuro is alert, Ox 3 , nf    L UA ABG + bruit      Renal-related home meds: Coreg  25 bid Lasix  40 every day Losartan 50 mg every day Phoslo  1 ac tid Rena-vite 1 every day Others: namenda , insulin , proscar , zetia , plavix , asa    OP HD: MWF [facility]  3h   B450   88.2kg   LUE AVG   Heparin  none  Last OP HD 5/14, post wt 88.5kg, getting to dry wt last 3 sessions   Assessment/ Plan: Abd pain: w/ nausea, vomiting. HIDA scan pending, gen surg following.  ESRD: on HD MWF. Last HD Wed. Plan 3h HD today/ tonight.  HTN: bp's stable 140/80 range Volume: euvolemic on exam, BP stable. Pull 2.5 L as tolerated.  Anemia of esrd: Hb 10-12 here,  follow.  Secondary hyperparathyroidism: CCa a bit low, add on phos, cont binders ac    Rob Zana Hesselbach  MD [facility] 11/12/2023, 10:57 AM  Recent Labs  Lab 11/11/23 1349  HGB 11.5*  ALBUMIN 3.5  CALCIUM  7.8*  CREATININE 7.54*  K 4.2   Inpatient medications:  aspirin   81 mg Oral QHS   calcium  acetate  667 mg Oral TID   carvedilol   3.125 mg Oral BID WC   cholecalciferol   1,000 Units Oral Daily   [START ON 11/21/2023] cyanocobalamin   1,000 mcg Intramuscular Q30 days   ezetimibe   10 mg Oral QPM   finasteride   5 mg Oral Daily   folic acid   2 mg Oral Daily   memantine   10 mg Oral BID   multivitamin  1 tablet Oral Daily   ondansetron  (ZOFRAN ) IV  4 mg Intravenous Once   polyethylene glycol  17 g Oral BID   senna-docusate  2 tablet Oral Daily    dextrose  5 % and 0.9 % NaCl 40 mL/hr at 11/12/23 0542   bisacodyl , EPINEPHrine , HYDROmorphone  (DILAUDID ) injection, OLANZapine, ondansetron  **OR** ondansetron  (ZOFRAN ) IV, polyethylene glycol

## 2023-11-12 NOTE — Plan of Care (Signed)
  Problem: Nutritional: Goal: Maintenance of adequate nutrition will improve Outcome: Progressing Goal: Progress toward achieving an optimal weight will improve Outcome: Progressing   Problem: Skin Integrity: Goal: Risk for impaired skin integrity will decrease Outcome: Progressing   Problem: Tissue Perfusion: Goal: Adequacy of tissue perfusion will improve Outcome: Progressing   Problem: Clinical Measurements: Goal: Ability to maintain clinical measurements within normal limits will improve Outcome: Progressing Goal: Will remain free from infection Outcome: Progressing Goal: Diagnostic test results will improve Outcome: Progressing Goal: Respiratory complications will improve Outcome: Progressing Goal: Cardiovascular complication will be avoided Outcome: Progressing   Problem: Activity: Goal: Risk for activity intolerance will decrease Outcome: Progressing   Problem: Nutrition: Goal: Adequate nutrition will be maintained Outcome: Progressing   Problem: Elimination: Goal: Will not experience complications related to bowel motility Outcome: Progressing Goal: Will not experience complications related to urinary retention Outcome: Progressing   Problem: Skin Integrity: Goal: Risk for impaired skin integrity will decrease Outcome: Progressing   Problem: Education: Goal: Ability to describe self-care measures that may prevent or decrease complications (Diabetes Survival Skills Education) will improve Outcome: Not Progressing Goal: Individualized Educational Video(s) Outcome: Not Progressing   Problem: Coping: Goal: Ability to adjust to condition or change in health will improve Outcome: Not Progressing   Problem: Fluid Volume: Goal: Ability to maintain a balanced intake and output will improve Outcome: Not Progressing   Problem: Health Behavior/Discharge Planning: Goal: Ability to identify and utilize available resources and services will improve Outcome: Not  Progressing Goal: Ability to manage health-related needs will improve Outcome: Not Progressing   Problem: Metabolic: Goal: Ability to maintain appropriate glucose levels will improve Outcome: Not Progressing   Problem: Education: Goal: Knowledge of General Education information will improve Description: Including pain rating scale, medication(s)/side effects and non-pharmacologic comfort measures Outcome: Not Progressing   Problem: Health Behavior/Discharge Planning: Goal: Ability to manage health-related needs will improve Outcome: Not Progressing   Problem: Coping: Goal: Level of anxiety will decrease Outcome: Not Progressing   Problem: Pain Managment: Goal: General experience of comfort will improve and/or be controlled Outcome: Not Progressing   Problem: Safety: Goal: Ability to remain free from injury will improve Outcome: Not Progressing

## 2023-11-12 NOTE — Progress Notes (Signed)
 Pt had incontinent BM.  When NT and I cleaned pt up, we noticed pt has two, small black circular wounds on right lateral foot.  I took a picture of it, attached to pt's media file.  Placed WOC RN consult for evaluation/assessment.

## 2023-11-12 NOTE — Progress Notes (Signed)
 Pt transferred to [id number]. Report was given to [employer].

## 2023-11-12 NOTE — Progress Notes (Signed)
 Subjective: Patient with no complaints this morning except he wants water.  Sitting on EOB.  Denies any abdominal pain, nausea, or vomiting.  Confused.  Unaware he is in the hospital or why he is here.   Objective: Vital signs in last 24 hours: Temp:  [97.7 F (36.5 C)-98.1 F (36.7 C)] 97.7 F (36.5 C) (05/17 0458) Pulse Rate:  [69-75] 75 (05/17 0458) Resp:  [11-17] 17 (05/17 0458) BP: (110-133)/(68-93) 133/93 (05/17 0458) SpO2:  [100 %] 100 % (05/17 0458) Weight:  [89.7 kg] 89.7 kg (05/16 2200)    Intake/Output from previous day: 05/16 0701 - 05/17 0700 In: 195 [I.V.:195] Out: -  Intake/Output this shift: No intake/output data recorded.  PE: Gen: NAD, on EOB Abd: soft, NT, ND, +BS  Lab Results:  Recent Labs    11/11/23 1349  WBC 4.9  HGB 11.5*  HCT 36.9*  PLT 139*   BMET Recent Labs    11/11/23 1349  NA 137  K 4.2  CL 93*  CO2 29  GLUCOSE 84  BUN 21  CREATININE 7.54*  CALCIUM  7.8*   PT/INR No results for input(s): "LABPROT", "INR" in the last 72 hours. CMP     Component Value Date/Time   NA 137 11/11/2023 1349   K 4.2 11/11/2023 1349   CL 93 (L) 11/11/2023 1349   CO2 29 11/11/2023 1349   GLUCOSE 84 11/11/2023 1349   BUN 21 11/11/2023 1349   CREATININE 7.54 (H) 11/11/2023 1349   CALCIUM  7.8 (L) 11/11/2023 1349   PROT 7.1 11/11/2023 1349   ALBUMIN 3.5 11/11/2023 1349   AST 27 11/11/2023 1349   ALT 13 11/11/2023 1349   ALKPHOS 69 11/11/2023 1349   BILITOT 0.8 11/11/2023 1349   GFRNONAA 7 (L) 11/11/2023 1349   GFRAA 19 (L) 01/19/2019 2111   Lipase     Component Value Date/Time   LIPASE 101 (H) 10/08/2020 1723       Studies/Results: US  Abdomen Limited RUQ (LIVER/GB) Result Date: 11/11/2023 CLINICAL DATA:  Abdominal pain. EXAM: ULTRASOUND ABDOMEN LIMITED RIGHT UPPER QUADRANT COMPARISON:  CT scan of same day. FINDINGS: Gallbladder: No cholelithiasis or sonographic Murphy's sign is noted. Moderate gallbladder wall thickening is  noted at 6 mm. Common bile duct: Diameter: 6 mm which is within normal limits. Liver: Nodular hepatic contours are noted suggesting hepatic cirrhosis. No definite focal abnormality is noted. Portal vein is patent on color Doppler imaging with normal direction of blood flow towards the liver. Other: None. IMPRESSION: Moderate gallbladder wall thickening is noted without cholelithiasis. Potentially this may represent acalculous cholecystitis or be secondary to adjacent hepatocellular disease or cirrhosis. HIDA scan may be performed for further evaluation. Electronically Signed   By: Rosalene Colon M.D.   On: 11/11/2023 15:21   CT ABDOMEN PELVIS WO CONTRAST Result Date: 11/11/2023 CLINICAL DATA:  Bowel obstruction suspected EXAM: CT ABDOMEN AND PELVIS WITHOUT CONTRAST TECHNIQUE: Multidetector CT imaging of the abdomen and pelvis was performed following the standard protocol without IV contrast. RADIATION DOSE REDUCTION: This exam was performed according to the departmental dose-optimization program which includes automated exposure control, adjustment of the mA and/or kV according to patient size and/or use of iterative reconstruction technique. COMPARISON:  CT of the chest abdomen and pelvis performed March 09, 2020 FINDINGS: Lower chest: Enlarged heart.  Trace left pleural effusion. Hepatobiliary: Motion related changes in the upper abdomen. The gallbladder is contracted and demonstrates diffuse wall thickening. Possible pericholecystic fluid. Small volume perihepatic  ascites. Pancreas: Unremarkable. No pancreatic ductal dilatation or surrounding inflammatory changes. Spleen: Normal in size without focal abnormality. Adrenals/Urinary Tract: Cortical thinning is present in both kidneys. Cystic changes are present bilaterally with the largest cyst in the right kidney estimated at 2.3 cm. The urinary bladder is contracted and demonstrates diffuse wall thickening. Stomach/Bowel: The stomach is nondilated. No  dilated loops of small bowel are appreciated. No evidence of appendicitis. Vascular/Lymphatic: Extensive vascular calcifications are present. No evidence of significant lymphadenopathy. Reproductive: Nothing significant. Other: Diastasis of the ventral abdominal wall. Musculoskeletal: Degenerative changes in the imaged osseous structures. IMPRESSION: 1. Gallbladder wall thickening with possible cholecystic fluid. Consider dedicated right upper quadrant ultrasound to evaluate for acute cholecystitis. 2. No evidence of bowel obstruction. 3. Renal atrophy with contracted urinary bladder with diffuse uniform wall thickening. This may represent sequelae of chronic renal disease. Electronically Signed   By: Reagan Camera M.D.   On: 11/11/2023 13:24   DG Chest Portable 1 View Result Date: 11/11/2023 CLINICAL DATA:  Cough and chest pain EXAM: PORTABLE CHEST 1 VIEW COMPARISON:  X-ray 12/22/2020 and older FINDINGS: Right upper chest battery pack with pacemaker leads overlying the right side of the heart. Similar configuration to previous. Enlarged cardiopericardial silhouette with calcified aorta. Overlapping cardiac leads. Film is rotated and tilted. The left inferior costophrenic angle is clipped off the edge of the film. There is some increasing left retrocardiac opacity. Subtle infiltrate is possible. No pneumothorax or edema. IMPRESSION: Enlarged heart.  Pacemaker. Left retrocardiac opacity. Subtle infiltrate is possible. Recommend follow-up. Electronically Signed   By: Adrianna Horde M.D.   On: 11/11/2023 12:08    Anti-infectives: Anti-infectives (From admission, onward)    None        Assessment/Plan Acute on chronic abdominal pain, ? Acalculous cholecystitis -currently has no abdominal pain, N/V -WBC yesterday normal with normal LFTs.  No labs today.  AF, VSS -HIDA scan has been ordered.  Hopefully they will do this today; however, if they are not able, given he is asymptomatic, it is likely ok to let  him try to eat to assure no recurrence of symptoms and treat clinically  FEN - NPO for HIDA VTE - ok for prophylaxis from our standpoint ID - none currently  SSS s/p pacemaker ESRD on HD HTN DM Multiple strokes Depression HLD cataracts  I reviewed nursing notes, hospitalist notes, last 24 h vitals and pain scores, last 48 h intake and output, last 24 h labs and trends, and last 24 h imaging results.   LOS: 1 day    Gregory Hood , [facility] Surgery 11/12/2023, 8:02 AM Please see Amion for pager number during day hours 7:00am-4:30pm or 7:00am -11:30am on weekends

## 2023-11-12 NOTE — Progress Notes (Signed)
 Progress Note   Patient: Gregory Hood:[id number] DOB: 04/02/1951 DOA: 11/11/2023     1 DOS: the patient was seen and examined on 11/12/2023   Brief hospital course:  Gregory Hood is a 73 y.o. male with medical history significant of DM II, multiple CVA, dementia, learning disabilities, HTN, hyperlipidemia, multiple CVA, NSVT, pacemaker, SSS, Chronic diastolic heart failure, anemia of chronic disease, ESRD on HD. The patient was brought to [facility] ED on 11/11/2023 due to nausea and vomiting and abdominal pain for 2 days. The patient denies fever. There is no other family available for history. The patient seems to be an unreliable historian and answers almost all questions with "I can't say", or doesn't answer at all. He states  that he does not take any medications at home, although we have a list of things that he supposedly are taking. When asked why he is taking, he states that he has no idea.   The patient has been evaluated by general surgery. Right upper quadrant ultrasound is demonstrating  gallbladder wall thickening without cholelithiasis. This may represent acalculous cholecystitis or be secondary to adjacent hepatocellular disease or cirrhosis. Recommendation is for HIDA scan which has been ordered, although it seems that this will not take place this weekend.   On my exam the patient is confused, agitated, but allows exam. He has no abdominal pain. He has been given a diet. If he is able to eat, and keep everything down and his glucoses are controlled he may be able to go home.   Assessment and Plan: Acute Cholecystitis: Per surgery. Although CT does look like acute cholecystitis, he does not have any bile duct dilatation or elevation of LFT's. Awaiting the results of HIDA. The patient states that he hasn't been taking his Plavix . He doesn't know for how long, or why he isn't taking that or any of his meds. Hard to know if he knows what he is talking about. He does appear to have pain on exam,  although he denies it. He wias initially kept NPO due to reports of vomiting, and HIDA scan in the morning and possible surgery. He was given IV fluids at 50 cc/hr which was changed to D5 NS in the morning after the patient became hypoglycemic.  On the morning of 11/12/2023 it became clear that the patient would likely not get his HIDA scan this weekend. He was given a diet, and general surgery stated that they considered the patient asymptomatic and that he could go home. The patient will be discharged to home once he has tolerated a diet, and it is clear that hid glucoses are under control.    Dementia: The patient is on namenda  at home. His wife has requested a sitter for safety as the patient is confused and will likely pull lines. I have asked for one.   NSVT/SSS The patient is on carvedilol . Will monitor him on telemetry. The patient has a pacemaker. Heart rate has ranged from 46 to 89.    Hypertension:  Continue carvedilol . The patient is currently normotensive.    DM II The patient reports that he doesn't take anything for his diabetes at home. It appears that at one point he had been on a low dose sliding scale of aspart at home. The patient has been on low dose SSI here. This morning he required initiation of D5 NS for his hypoglycemic episode. His diet has now been restarted. Will discontinue fluids and monitor his glucoses. If he tolerates  his diet and his fglucoses are controlled he may be discharged to home.    ESRD on HD: The patient arrived at HD today and was sent to the ED due to vomiting. He is metabolically stable for now. Nephrology has been consulted and the patient will be dialyzed.   Chronic Diastolic Heart failure: Appears well compensated. Monitor I's and O's.   Anemia of Chronic (Renal) Disease:  Hemoglobin appears stable at 11.5. Monitor.   History of multiple CVA: It appears that the patient should be on plavix  at home, although the patient states that he is not  taking it. In any case I have not continued it as inpatient unless a surgical procedure is planned.         Subjective: The patient is awake, confused, and agitated this morning. No complaints of pain.  Physical Exam: Vitals:   11/12/23 0103 11/12/23 0458 11/12/23 1252 11/12/23 1300  BP: 128/82 (!) 133/93 (!) 176/90 (!) 157/86  Pulse: 72 75 70 74  Resp: 17 17    Temp: 98.1 F (36.7 C) 97.7 F (36.5 C) 97.7 F (36.5 C)   TempSrc: Oral Oral Oral   SpO2: 100% 100% 100%   Weight:      Height:       Exam:  Constitutional:  The patient is awake, alert, and oriented x 1.he is agitated and confused and at times combative. Respiratory:  No increased work of breathing. No wheezes, rales, or rhonchi No tactile fremitus Cardiovascular:  Regular rate and rhythm No murmurs, ectopy, or gallups. No lateral PMI. No thrills. Abdomen:  Abdomen is soft, non-tender, non-distended No hernias, masses, or organomegaly Normoactive bowel sounds.  Musculoskeletal:  No cyanosis, clubbing, or edema Skin:  No rashes, lesions, ulcers palpation of skin: no induration or nodules Neurologic:  The patient is unable to cooperate with exam. Psychiatric:  The patient is unable to cooperate with exam.  Data Reviewed:  CBC CMP  Family Communication: I have attempted to reach the patient's wife and only was able to reach voicemail.  Disposition: Status is: Inpatient Remains inpatient appropriate because: Needs to undergo HD and have ability to tolerate diet and stabilization of blood sugar established before discharge.  Planned Discharge Destination: Home    Time spent: 42 minutes  Author: Lovina Zuver, DO 11/12/2023 3:00 PM  For on call review [email or web address].

## 2023-11-13 DIAGNOSIS — K811 Chronic cholecystitis: Secondary | ICD-10-CM | POA: Diagnosis not present

## 2023-11-13 DIAGNOSIS — R112 Nausea with vomiting, unspecified: Secondary | ICD-10-CM

## 2023-11-13 LAB — CBC WITH DIFFERENTIAL/PLATELET
Abs Immature Granulocytes: 0.02 10*3/uL (ref 0.00–0.07)
Basophils Absolute: 0 10*3/uL (ref 0.0–0.1)
Basophils Relative: 1 %
Eosinophils Absolute: 0.1 10*3/uL (ref 0.0–0.5)
Eosinophils Relative: 1 %
HCT: 39 % (ref 39.0–52.0)
Hemoglobin: 12.6 g/dL — ABNORMAL LOW (ref 13.0–17.0)
Immature Granulocytes: 0 %
Lymphocytes Relative: 7 %
Lymphs Abs: 0.5 10*3/uL — ABNORMAL LOW (ref 0.7–4.0)
MCH: 29.4 pg (ref 26.0–34.0)
MCHC: 32.3 g/dL (ref 30.0–36.0)
MCV: 91.1 fL (ref 80.0–100.0)
Monocytes Absolute: 0.9 10*3/uL (ref 0.1–1.0)
Monocytes Relative: 13 %
Neutro Abs: 5.2 10*3/uL (ref 1.7–7.7)
Neutrophils Relative %: 78 %
Platelets: 118 10*3/uL — ABNORMAL LOW (ref 150–400)
RBC: 4.28 MIL/uL (ref 4.22–5.81)
RDW: 15.6 % — ABNORMAL HIGH (ref 11.5–15.5)
WBC: 6.6 10*3/uL (ref 4.0–10.5)
nRBC: 0 % (ref 0.0–0.2)

## 2023-11-13 LAB — COMPREHENSIVE METABOLIC PANEL WITH GFR
ALT: 8 U/L (ref 0–44)
AST: 27 U/L (ref 15–41)
Albumin: 3.3 g/dL — ABNORMAL LOW (ref 3.5–5.0)
Alkaline Phosphatase: 61 U/L (ref 38–126)
Anion gap: 18 — ABNORMAL HIGH (ref 5–15)
BUN: 32 mg/dL — ABNORMAL HIGH (ref 8–23)
CO2: 19 mmol/L — ABNORMAL LOW (ref 22–32)
Calcium: 7.4 mg/dL — ABNORMAL LOW (ref 8.9–10.3)
Chloride: 98 mmol/L (ref 98–111)
Creatinine, Ser: 9.22 mg/dL — ABNORMAL HIGH (ref 0.61–1.24)
GFR, Estimated: 6 mL/min — ABNORMAL LOW (ref >60)
Glucose, Bld: 125 mg/dL — ABNORMAL HIGH (ref 70–99)
Potassium: 4.6 mmol/L (ref 3.5–5.1)
Sodium: 135 mmol/L (ref 135–145)
Total Bilirubin: 0.8 mg/dL (ref 0.0–1.2)
Total Protein: 6.4 g/dL — ABNORMAL LOW (ref 6.5–8.1)

## 2023-11-13 LAB — GLUCOSE, CAPILLARY
Glucose-Capillary: 174 mg/dL — ABNORMAL HIGH (ref 70–99)
Glucose-Capillary: 136 mg/dL — ABNORMAL HIGH (ref 70–99)
Glucose-Capillary: 99 mg/dL (ref 70–99)
Glucose-Capillary: 151 mg/dL — ABNORMAL HIGH (ref 70–99)

## 2023-11-13 MED ORDER — CARVEDILOL 3.125 MG PO TABS: 3.1250 mg | ORAL_TABLET | Freq: Two times a day (BID) | ORAL | 0 refills | Status: DC

## 2023-11-13 NOTE — Progress Notes (Signed)
 Report given to [place] at [facility].

## 2023-11-13 NOTE — TOC Transition Note (Addendum)
 Transition of Care [facility]) - Discharge Note   Patient Details  Name: Gregory Hood MRN: [id number] Date of Birth: Nov 18, 1950  Transition of Care [facility]) CM/SW Contact:  Jeffory Mings, [place] Phone Number: 11/13/2023, 1:05 PM   Clinical Narrative: Pt for dc back to [facility] today where he is a LTC resident. Spoke to [employer] in admissions who confirmed pt able to return to room 301. RN provided with number for report and PTAR arranged for transport. Voicemail left for pt's wife Gregory Hood notifying her of dc. SW signing off at dc.   UPDATE 1350: Pt's wife Gregory Hood returned call and confirms agreeable to dc plan. Wife requesting medical update, MD notified.    Paullette Boston, MSW, LCSW [phone] (coverage)        Final next level of care: Skilled Nursing Facility Barriers to Discharge: Barriers Resolved   Patient Goals and CMS Choice            Discharge Placement              Patient chooses bed at: Other - please specify in the comment section below: ([facility]) Patient to be transferred to facility by: PTAR Name of family member notified: Gregory Hood Patient and family notified of of transfer: 11/13/23  Discharge Plan and Services Additional resources added to the After Visit Summary for                                       Social Drivers of Health (SDOH) Interventions SDOH Screenings   Transportation Needs: No Transportation Needs (06/06/2023)   Received from [facility]  Tobacco Use: Medium Risk (11/11/2023)     Readmission Risk Interventions     No data to display

## 2023-11-13 NOTE — Progress Notes (Signed)
 Patient remains inpatient for HD as he is unable to get a HIDA scan this weekend.  No plans for surgery as he is asymptomatic from an abdominal standpoint.  Ok for diet and home per medicine.  We are available as needed.  Leone Ralphs 7:40 AM 11/13/2023

## 2023-11-13 NOTE — Discharge Summary (Signed)
 Physician Discharge Summary  Gregory Hood RUE:[id number] DOB: September 20, 1950 DOA: 11/11/2023  PCP: Edda Goo, MD  Admit date: 11/11/2023 Discharge date: 11/13/23  Admitted From: LTC Disposition: LTC Recommendations for Outpatient Follow-up:  Follow up with PCP in 1 week Check CMP and CBC in 1 week Please follow up on the following pending results: None   Discharge Condition: Stable CODE STATUS: Full code  Follow-up Information     Edda Goo, MD. Schedule an appointment as soon as possible for a visit in 1 week(s).   Specialty: Internal Medicine Contact information: [address] [place] [address] [phone]                 Hospital course 73 year old M with PMH of ESRD on HD MWF, dementia, multiple CVA, NSVT/SSS/PPM, chronic HFpEF, DM-2, HTN and anemia of chronic disease presented to [facility] ED on 5/16 due to nausea, vomiting and abdominal pain for 2 days, and admitted due to concern for possible acalculous cholecystitis.  Patient is not a great historian.  He had no fever or leukocytosis.  No LFT elevation either.  CT abdomen and pelvis without contrast showed gallbladder wall thickening with possible cholecystic fluid, and RUQ US  was recommended.  RUQ US  showed moderate gallbladder wall thickening without cholelithiasis, and HIDA scan was recommended.  General surgery consulted and evaluated patient.  Patient has not had further nausea, vomiting or abdominal pain.  He tolerated regular diet.  He has not had fever or leukocytosis either.  Acute cholecystitis felt to be unlikely.  He was cleared for discharge by general surgery for outpatient follow-up.  He will be returning to LTC  See individual problem list below for more.   Problems addressed during this hospitalization Acute Cholecystitis: Ruled out.  CT and RUQ US  suggested gallbladder wall thickening likely chronic cholecystitis.  He has no fever, leukocytosis or elevated LFT.  No further nausea or  vomiting.  Tolerating regular diet.  Cleared for discharge by general surgery.  Consider outpatient follow-up - Continue PPI  Dementia without behavioral disturbance: Patient is awake and alert but only oriented to self and place.  Not oriented to time.  No insight into why he is in the hospital. -Reorientation and delirium precaution  ESRD on HD MWF: Missed HD on Friday.  Evaluated by nephrology.  No emergent need. -Per nephrology, can go to outpatient dialysis tomorrow, 5/19   Chronic Diastolic Heart failure: Stable.  No cardiopulmonary symptoms -Fluid management by dialysis  Hypertension: Fluctuating blood pressure -Continue home Coreg , losartan and Lasix .  NSVT/SSS s/p pacemaker  IDDM-2 with level 1 hypoglycemia and hyperglycemia: A1c 4.8%. Recent Labs  Lab 11/12/23 2352 11/13/23 0202 11/13/23 0354 11/13/23 0829 11/13/23 1135  GLUCAP 110* 174* 136* 99 151*  - Continue home insulin    Anemia of Chronic (Renal) Disease: Stable   History of multiple CVA: Stable.  No focal neurodeficit. -Continue on Plavix , aspirin , Zetia   Chronic pain - Continue home meds  Family communication: Attempted to call patient's wife and his 2 sons using the phone number listed in the chart but no answer.  It does not look like they have voicemail either.             Time spent 35 minutes  Vital signs Vitals:   11/12/23 2349 11/13/23 0350 11/13/23 0752 11/13/23 1139  BP: 115/67 138/82 (!) 124/59 (!) 177/89  Pulse: 74 81 75 80  Temp: 98.7 F (37.1 C) 97.8 F (36.6 C) 97.6 F (36.4 C) 98 F (  36.7 C)  Resp: 18 18    Height:      Weight:      SpO2: 100% 100% 100% 100%  TempSrc: Oral Oral Oral Oral  BMI (Calculated):         Discharge exam  GENERAL: No apparent distress.  Nontoxic. HEENT: MMM.  Hearing grossly intact.  Right eye vision intact.  Corneal scarring of left eye NECK: Supple.  No apparent JVD.  RESP:  No IWOB.  Fair aeration bilaterally. CVS:  RRR. Heart sounds  normal.  ABD/GI/GU: BS+. Abd soft, NTND.  MSK/EXT:  Moves extremities. No apparent deformity. No edema.  SKIN: no apparent skin lesion or wound NEURO: Awake and alert. Oriented to self and place.  Follows commands.  No insight into why he is in the hospital.  No apparent focal neuro deficit. PSYCH: Calm. Normal affect.   Discharge Instructions Discharge Instructions     Diet - low sodium heart healthy   Complete by: As directed    Discharge instructions   Complete by: As directed    It has been a pleasure taking care of you!  You were hospitalized due to nausea, vomiting and abdominal pain.  It is unclear what caused your symptoms but your symptoms resolved.  Follow-up with your primary care doctor.    Take care,   Increase activity slowly   Complete by: As directed       Allergies as of 11/13/2023       Reactions   Bee Venom Swelling   Lexiscan  [regadenoson ] Other (See Comments)   Seizure like-activity after lexiscan  given.   Statins    Other reaction(s): Other (See Comments) Myalgias and CK increase.  Tried both atorvastatin  and rosuvastatin    Ace Inhibitors Cough        Medication List     TAKE these medications    acetaminophen  500 MG tablet Commonly known as: TYLENOL  Take 500 mg by mouth daily.   ascorbic acid 500 MG tablet Commonly known as: VITAMIN C Take 1 tablet by mouth daily.   aspirin  81 MG tablet Take 1 tablet (81 mg total) by mouth at bedtime.   atropine  1 % ophthalmic solution Place 1 drop into both eyes.   bisacodyl  10 MG suppository Commonly known as: DULCOLAX Place 10 mg rectally as needed for moderate constipation.   calcium  acetate 667 MG capsule Commonly known as: PHOSLO  Take 667 mg by mouth 3 (three) times daily.   carvedilol  3.125 MG tablet Commonly known as: COREG  Take 1 tablet (3.125 mg total) by mouth 2 (two) times daily with a meal. Take after dialysis on dialysis days What changed: Another medication with the same name  was removed. Continue taking this medication, and follow the directions you see here.   Cholecalciferol  50 MCG (2000 UT) Tabs Take 0.5 tablets (1,000 Units total) by mouth daily.   clopidogrel  75 MG tablet Commonly known as: PLAVIX  Take 75 mg by mouth daily.   cyanocobalamin  1000 MCG/ML injection Commonly known as: VITAMIN B12 Inject 1,000 mcg into the muscle every 30 (thirty) days. On the 26th   divalproex 125 MG DR tablet Commonly known as: DEPAKOTE Take 125 mg by mouth.   EPINEPHrine  0.3 mg/0.3 mL Soaj injection Commonly known as: EPI-PEN Inject 0.3 mg into the muscle once as needed for anaphylaxis.   ezetimibe  10 MG tablet Commonly known as: ZETIA  Take 10 mg by mouth every evening.   finasteride  5 MG tablet Commonly known as: PROSCAR  Take 5 mg by  mouth daily.   FLEET ENEMA RE Place 1 Dose rectally as needed (constipation).   folic acid  1 MG tablet Commonly known as: FOLVITE  Take 2 tablets (2 mg total) by mouth daily.   furosemide  40 MG tablet Commonly known as: LASIX  Take 1 tablet by mouth daily.   GLUCAGON EMERGENCY IJ Inject 1 mg into the muscle as needed (blood sugar less than 70).   Glucose 77.4 % Gel Take 1 Dose by mouth as needed (blood glucose less than 70).   Insulin  Aspart FlexPen 100 UNIT/ML Commonly known as: NOVOLOG  Inject 2-10 Units into the skin See admin instructions. Bid per sliding scale Less than 150= 0 units, 151-200= 2 units, 201-250=4 units, 251-300= 6 units, 301-350= 8 units, 351-400= 10 units, if less than 70 or over 401 call md   Iron 325 (65 Fe) MG Tabs Take 1 tablet by mouth daily.   losartan 50 MG tablet Commonly known as: COZAAR Take 50 mg by mouth daily.   melatonin 3 MG Tabs tablet Take 3 mg by mouth at bedtime.   memantine  10 MG tablet Commonly known as: NAMENDA  Take 10 mg by mouth 2 (two) times daily.   multivitamin Tabs tablet Take 1 tablet by mouth daily.   niacin  100 MG tablet Commonly known as: VITAMIN  B3 Take 200 mg by mouth every evening.   ondansetron  4 MG disintegrating tablet Commonly known as: ZOFRAN -ODT Take 4 mg by mouth every 4 (four) hours as needed for nausea or vomiting.   OVER THE COUNTER MEDICATION Take 1 Dose by mouth every evening. Yogurt for weight loss   polyethylene glycol 17 g packet Commonly known as: MIRALAX  / GLYCOLAX  Take 17 g by mouth 2 (two) times daily.   Red Yeast Rice 600 MG Caps Take 1,200 mg by mouth at bedtime.   senna-docusate 8.6-50 MG tablet Commonly known as: Senokot-S Take 2 tablets by mouth daily.   terbinafine 250 MG tablet Commonly known as: LAMISIL Take 250 mg by mouth daily.   traMADol  50 MG tablet Commonly known as: ULTRAM  Take 50 mg by mouth every 6 (six) hours as needed for moderate pain (pain score 4-6).   traZODone 150 MG tablet Commonly known as: DESYREL Take 150 mg by mouth at bedtime.        Consultations: Nephrology General Surgery  Procedures/Studies:   US  Abdomen Limited RUQ (LIVER/GB) Result Date: 11/11/2023 CLINICAL DATA:  Abdominal pain. EXAM: ULTRASOUND ABDOMEN LIMITED RIGHT UPPER QUADRANT COMPARISON:  CT scan of same day. FINDINGS: Gallbladder: No cholelithiasis or sonographic Murphy's sign is noted. Moderate gallbladder wall thickening is noted at 6 mm. Common bile duct: Diameter: 6 mm which is within normal limits. Liver: Nodular hepatic contours are noted suggesting hepatic cirrhosis. No definite focal abnormality is noted. Portal vein is patent on color Doppler imaging with normal direction of blood flow towards the liver. Other: None. IMPRESSION: Moderate gallbladder wall thickening is noted without cholelithiasis. Potentially this may represent acalculous cholecystitis or be secondary to adjacent hepatocellular disease or cirrhosis. HIDA scan may be performed for further evaluation. Electronically Signed   By: Rosalene Colon M.D.   On: 11/11/2023 15:21   CT ABDOMEN PELVIS WO CONTRAST Result Date:  11/11/2023 CLINICAL DATA:  Bowel obstruction suspected EXAM: CT ABDOMEN AND PELVIS WITHOUT CONTRAST TECHNIQUE: Multidetector CT imaging of the abdomen and pelvis was performed following the standard protocol without IV contrast. RADIATION DOSE REDUCTION: This exam was performed according to the departmental dose-optimization program which includes automated exposure control,  adjustment of the mA and/or kV according to patient size and/or use of iterative reconstruction technique. COMPARISON:  CT of the chest abdomen and pelvis performed March 09, 2020 FINDINGS: Lower chest: Enlarged heart.  Trace left pleural effusion. Hepatobiliary: Motion related changes in the upper abdomen. The gallbladder is contracted and demonstrates diffuse wall thickening. Possible pericholecystic fluid. Small volume perihepatic ascites. Pancreas: Unremarkable. No pancreatic ductal dilatation or surrounding inflammatory changes. Spleen: Normal in size without focal abnormality. Adrenals/Urinary Tract: Cortical thinning is present in both kidneys. Cystic changes are present bilaterally with the largest cyst in the right kidney estimated at 2.3 cm. The urinary bladder is contracted and demonstrates diffuse wall thickening. Stomach/Bowel: The stomach is nondilated. No dilated loops of small bowel are appreciated. No evidence of appendicitis. Vascular/Lymphatic: Extensive vascular calcifications are present. No evidence of significant lymphadenopathy. Reproductive: Nothing significant. Other: Diastasis of the ventral abdominal wall. Musculoskeletal: Degenerative changes in the imaged osseous structures. IMPRESSION: 1. Gallbladder wall thickening with possible cholecystic fluid. Consider dedicated right upper quadrant ultrasound to evaluate for acute cholecystitis. 2. No evidence of bowel obstruction. 3. Renal atrophy with contracted urinary bladder with diffuse uniform wall thickening. This may represent sequelae of chronic renal disease.  Electronically Signed   By: Reagan Camera M.D.   On: 11/11/2023 13:24   DG Chest Portable 1 View Result Date: 11/11/2023 CLINICAL DATA:  Cough and chest pain EXAM: PORTABLE CHEST 1 VIEW COMPARISON:  X-ray 12/22/2020 and older FINDINGS: Right upper chest battery pack with pacemaker leads overlying the right side of the heart. Similar configuration to previous. Enlarged cardiopericardial silhouette with calcified aorta. Overlapping cardiac leads. Film is rotated and tilted. The left inferior costophrenic angle is clipped off the edge of the film. There is some increasing left retrocardiac opacity. Subtle infiltrate is possible. No pneumothorax or edema. IMPRESSION: Enlarged heart.  Pacemaker. Left retrocardiac opacity. Subtle infiltrate is possible. Recommend follow-up. Electronically Signed   By: Adrianna Horde M.D.   On: 11/11/2023 12:08       The results of significant diagnostics from this hospitalization (including imaging, microbiology, ancillary and laboratory) are listed below for reference.     Microbiology: No results found for this or any previous visit (from the past 240 hours).   Labs:  CBC: Recent Labs  Lab 11/11/23 1349 11/12/23 2053 11/13/23 0941  WBC 4.9 6.0 6.6  NEUTROABS 3.8  --  5.2  HGB 11.5* 12.1* 12.6*  HCT 36.9* 38.4* 39.0  MCV 92.7 91.2 91.1  PLT 139* 129* 118*   BMP &GFR Recent Labs  Lab 11/11/23 1349 11/12/23 2053 11/13/23 0941  NA 137 136 135  K 4.2 4.3 4.6  CL 93* 95* 98  CO2 29 24 19*  GLUCOSE 84 123* 125*  BUN 21 29* 32*  CREATININE 7.54* 9.03* 9.22*  CALCIUM  7.8* 7.4* 7.4*   Estimated Creatinine Clearance: 8.3 mL/min (A) (by C-G formula based on SCr of 9.22 mg/dL (H)). Liver & Pancreas: Recent Labs  Lab 11/11/23 1349 11/12/23 2053 11/13/23 0941  AST 27 24 27   ALT 13 12 8   ALKPHOS 69 63 61  BILITOT 0.8 0.8 0.8  PROT 7.1 7.1 6.4*  ALBUMIN 3.5 3.6 3.3*   No results for input(s): "LIPASE", "AMYLASE" in the last 168 hours. No results  for input(s): "AMMONIA" in the last 168 hours. Diabetic: Recent Labs    11/11/23 1955  HGBA1C 4.8   Recent Labs  Lab 11/12/23 2352 11/13/23 0202 11/13/23 0354 11/13/23 0829 11/13/23 1135  GLUCAP  110* 174* 136* 99 151*   Cardiac Enzymes: No results for input(s): "CKTOTAL", "CKMB", "CKMBINDEX", "TROPONINI" in the last 168 hours. No results for input(s): "PROBNP" in the last 8760 hours. Coagulation Profile: No results for input(s): "INR", "PROTIME" in the last 168 hours. Thyroid Function Tests: No results for input(s): "TSH", "T4TOTAL", "FREET4", "T3FREE", "THYROIDAB" in the last 72 hours. Lipid Profile: No results for input(s): "CHOL", "HDL", "LDLCALC", "TRIG", "CHOLHDL", "LDLDIRECT" in the last 72 hours. Anemia Panel: No results for input(s): "VITAMINB12", "FOLATE", "FERRITIN", "TIBC", "IRON", "RETICCTPCT" in the last 72 hours. Urine analysis:    Component Value Date/Time   COLORURINE YELLOW 12/22/2020 1645   APPEARANCEUR CLEAR 12/22/2020 1645   LABSPEC 1.014 12/22/2020 1645   PHURINE 7.0 12/22/2020 1645   GLUCOSEU NEGATIVE 12/22/2020 1645   HGBUR SMALL (A) 12/22/2020 1645   BILIRUBINUR NEGATIVE 12/22/2020 1645   KETONESUR NEGATIVE 12/22/2020 1645   PROTEINUR 100 (A) 12/22/2020 1645   UROBILINOGEN 0.2 09/24/2014 0216   NITRITE NEGATIVE 12/22/2020 1645   LEUKOCYTESUR NEGATIVE 12/22/2020 1645   Sepsis Labs: Invalid input(s): "PROCALCITONIN", "LACTICIDVEN"   SIGNED:  Theadore Finger, MD  [facility] 11/13/2023, 12:40 PM

## 2023-11-13 NOTE — Evaluation (Signed)
 Physical Therapy Evaluation Patient Details Name: Gregory Hood MRN: [id number] DOB: 08/13/1950 Today's Date: 11/13/2023  History of Present Illness  Pt is a 73 y.o. male admitted 5/16 from HD clinic due to N&V. PMH:  DM II, multiple CVA, dementia, learning disabilities, HTN, hyperlipidemia, multiple CVA, NSVT, pacemaker, SSS, Chronic diastolic heart failure, anemia of chronic disease, ESRD on HD.   Clinical Impression  PT eval complete. PTA pt resided at [facility] and [facility], primarily w/c bound with staff assist for transfers. On eval, pt required min assist sit to stand, min assist SPT, and mod assist amb 5' with RW. Pt appears at/near baseline. No further skilled PT services indicated. PT signing off.      If plan is discharge home, recommend the following:     Can travel by private vehicle   No    Equipment Recommendations None recommended by PT  Recommendations for Other Services       Functional Status Assessment Patient has not had a recent decline in their functional status     Precautions / Restrictions Precautions Precautions: Fall      Mobility  Bed Mobility               General bed mobility comments: Pt sitting EOB on arrival.    Transfers Overall transfer level: Needs assistance Equipment used: Rolling walker (2 wheels) Transfers: Sit to/from Stand Sit to Stand: Min assist           General transfer comment: assist to power up    Ambulation/Gait Ambulation/Gait assistance: Mod assist Gait Distance (Feet): 5 Feet Assistive device: Rolling walker (2 wheels) Gait Pattern/deviations: Step-through pattern, Decreased stride length, Narrow base of support       General Gait Details: unsteady, assist to maintain balance and manage RW, poor command follow and difficult to redirect  Stairs            Wheelchair Mobility     Tilt Bed    Modified Rankin (Stroke Patients Only)       Balance Overall balance  assessment: Needs assistance Sitting-balance support: Feet supported, Single extremity supported Sitting balance-Leahy Scale: Fair     Standing balance support: Bilateral upper extremity supported, During functional activity, Reliant on assistive device for balance Standing balance-Leahy Scale: Poor                               Pertinent Vitals/Pain Pain Assessment Pain Assessment: No/denies pain    Home Living Family/patient expects to be discharged to:: Skilled nursing facility                   Additional Comments: [facility]    Prior Function Prior Level of Function : Needs assist;Patient poor historian/Family not available             Mobility Comments: primarily w/c bound. Staff assists with transfers ADLs Comments: assist from SNF staff     Extremity/Trunk Assessment   Upper Extremity Assessment Upper Extremity Assessment: Generalized weakness (residual deficits from previous strokes)    Lower Extremity Assessment Lower Extremity Assessment: Generalized weakness (residual deficits from previous strokes)    Cervical / Trunk Assessment Cervical / Trunk Assessment: Kyphotic  Communication   Communication Communication: Impaired Factors Affecting Communication: Difficulty expressing self    Cognition Arousal: Alert Behavior During Therapy: Impulsive   PT - Cognitive impairments: No family/caregiver present to determine baseline  PT - Cognition Comments: dementia at baseline Following commands: Impaired Following commands impaired: Follows one step commands inconsistently     Cueing Cueing Techniques: Verbal cues, Gestural cues, Tactile cues, Visual cues     General Comments      Exercises     Assessment/Plan    PT Assessment Patient does not need any further PT services  PT Problem List Decreased strength;Decreased balance;Decreased cognition;Decreased knowledge of  precautions;Decreased mobility;Decreased activity tolerance;Decreased safety awareness       PT Treatment Interventions Functional mobility training;Balance training;Patient/family education;Therapeutic activities;Gait training;Therapeutic exercise    PT Goals (Current goals can be found in the Care Plan section)  Acute Rehab PT Goals Patient Stated Goal: not stated PT Goal Formulation: All assessment and education complete, DC therapy Time For Goal Achievement: 11/27/23 Potential to Achieve Goals: Fair    Frequency Min 1X/week     Co-evaluation               AM-PAC PT "6 Clicks" Mobility  Outcome Measure Help needed turning from your back to your side while in a flat bed without using bedrails?: A Little Help needed moving from lying on your back to sitting on the side of a flat bed without using bedrails?: A Little Help needed moving to and from a bed to a chair (including a wheelchair)?: A Lot Help needed standing up from a chair using your arms (e.g., wheelchair or bedside chair)?: A Little Help needed to walk in hospital room?: A Lot Help needed climbing 3-5 steps with a railing? : Total 6 Click Score: 14    End of Session Equipment Utilized During Treatment: Gait belt Activity Tolerance: Patient tolerated treatment well Patient left: in chair;with call bell/phone within reach;with chair alarm set (alarm belt) Nurse Communication: Mobility status PT Visit Diagnosis: Other abnormalities of gait and mobility (R26.89)    Time: 4098-1191 PT Time Calculation (min) (ACUTE ONLY): 20 min   Charges:   PT Evaluation $PT Eval Moderate Complexity: 1 Mod   PT General Charges $$ ACUTE PT VISIT: 1 Visit         Dorothye Gathers., PT  Office # [phone]   Guadelupe Leech 11/13/2023, 11:33 AM

## 2023-11-13 NOTE — Discharge Planning (Signed)
 [facility] Patient Discharge Orders- [facility] CLINIC: [place]  Patient's name: Gregory Hood Admit/DC Dates: 11/11/2023 - 11/13/23  Discharge Diagnoses: Acute cholecystitis   Aranesp : Given: no   Date and amount of last dose: n/a  Last Hgb: 12.6 PRBC's Given: no Date/# of units: n/a  ESA dose for discharge:none IV Iron dose at discharge: none  Heparin  change: no  EDW Change: no New EDW:   [place] Change: no  Access intervention/Change: no Details:  Hectorol/Calcitriol  change: no  Discharge Labs: Calcium  7.4 Phosphorus --  Albumin 3.3 K+ 4.6  IV Antibiotics: no Details:  On Coumadin?: no Last INR: Next INR: Managed By:   OTHER/APPTS/LAB ORDERS:    D/C Meds to be reconciled by nurse after every discharge.  Completed By: Ramona Burner, PA-C 11/13/2023, 12:43 PM  [facility] Pager: [phone]    Reviewed by: MD:______ RN_______

## 2023-11-13 NOTE — Evaluation (Addendum)
 Occupational Therapy Evaluation Patient Details Name: Gregory Hood MRN: [id number] DOB: 1950-07-30 Today's Date: 11/13/2023   History of Present Illness   Pt is a 73 y.o. male admitted 5/16 from HD clinic due to N&V. PMH:  DM II, multiple CVA, dementia, learning disabilities, HTN, hyperlipidemia, multiple CVA, NSVT, pacemaker, SSS, Chronic diastolic heart failure, anemia of chronic disease, ESRD on HD.     Clinical Impressions Pt questionable historian, assume assist at baseline with ADL/functional mobility. Pt from SNF. Pt currently needing up to max A for ADLs, mod A for bed mobility and min-mod +2 for tranfers. Pt ambulates to bathroom min A, with cues and physical assist to move RW for navigation, pt needing mod +2 to stand and pivot from low commode surface. Pt with impaired command follow, and needs mod cues to redirect throughout. Pt presenting with impairments listed below, will follow acutely. Recommend return to LTC at d/c.      If plan is discharge home, recommend the following:   Two people to help with walking and/or transfers;A lot of help with bathing/dressing/bathroom;Assistance with cooking/housework;Direct supervision/assist for medications management;Direct supervision/assist for financial management;Assist for transportation;Help with stairs or ramp for entrance     Functional Status Assessment   Patient has had a recent decline in their functional status and demonstrates the ability to make significant improvements in function in a reasonable and predictable amount of time.     Equipment Recommendations   Other (comment) (defer)     Recommendations for Other Services   PT consult     Precautions/Restrictions   Precautions Precautions: Fall Restrictions Weight Bearing Restrictions Per Provider Order: No     Mobility Bed Mobility Overal bed mobility: Needs Assistance Bed Mobility: Sit to Supine       Sit to supine: Mod assist   General bed  mobility comments: mod A to initiate    Transfers Overall transfer level: Needs assistance Equipment used: Rolling walker (2 wheels) Transfers: Sit to/from Stand, Bed to chair/wheelchair/BSC Sit to Stand: Min assist     Step pivot transfers: Mod assist, +2 physical assistance     General transfer comment: assist to physically move the RW to navigate environment      Balance Overall balance assessment: Needs assistance Sitting-balance support: Feet supported, Single extremity supported Sitting balance-Leahy Scale: Fair     Standing balance support: Bilateral upper extremity supported, During functional activity, Reliant on assistive device for balance Standing balance-Leahy Scale: Poor                             ADL either performed or assessed with clinical judgement   ADL Overall ADL's : Needs assistance/impaired Eating/Feeding: Sitting;Minimal assistance   Grooming: Minimal assistance;Sitting;Bed level   Upper Body Bathing: Moderate assistance;Bed level;Sitting   Lower Body Bathing: Maximal assistance;Sitting/lateral leans;Bed level   Upper Body Dressing : Moderate assistance;Sitting;Bed level   Lower Body Dressing: Maximal assistance;Sitting/lateral leans;Bed level   Toilet Transfer: Moderate assistance;+2 for physical assistance;Stand-pivot;Ambulation;Minimal assistance           Functional mobility during ADLs: Minimal assistance;Moderate assistance;+2 for physical assistance       Vision   Additional Comments: questionable impaired vision L eye     Perception Perception: Not tested       Praxis Praxis: Not tested       Pertinent Vitals/Pain Pain Assessment Pain Assessment: No/denies pain     Extremity/Trunk Assessment Upper Extremity Assessment Upper Extremity Assessment:  Generalized weakness   Lower Extremity Assessment Lower Extremity Assessment: Defer to PT evaluation   Cervical / Trunk Assessment Cervical / Trunk  Assessment: Kyphotic   Communication Communication Communication: Impaired Factors Affecting Communication: Difficulty expressing self   Cognition Arousal: Alert Behavior During Therapy: Impulsive Cognition: History of cognitive impairments             OT - Cognition Comments: hx dementia                 Following commands: Impaired Following commands impaired: Follows one step commands inconsistently     Cueing  General Comments   Cueing Techniques: Verbal cues;Gestural cues;Tactile cues;Visual cues  VSS on RA; HR 73 end of session   Exercises     Shoulder Instructions      Home Living Family/patient expects to be discharged to:: Skilled nursing facility                                 Additional Comments: [facility]      Prior Functioning/Environment Prior Level of Function : Needs assist;Patient poor historian/Family not available             Mobility Comments: primarily w/c bound. Staff assists with transfers ADLs Comments: assist from SNF staff    OT Problem List: Decreased strength;Decreased range of motion;Decreased activity tolerance;Impaired balance (sitting and/or standing);Decreased cognition;Decreased safety awareness;Impaired vision/perception;Cardiopulmonary status limiting activity   OT Treatment/Interventions: Self-care/ADL training;Therapeutic exercise;Energy conservation;DME and/or AE instruction;Therapeutic activities;Patient/family education;Balance training      OT Goals(Current goals can be found in the care plan section)   Acute Rehab OT Goals Patient Stated Goal: none stated OT Goal Formulation: With patient Time For Goal Achievement: 11/27/23 Potential to Achieve Goals: Good   OT Frequency:  Min 2X/week    Co-evaluation              AM-PAC OT "6 Clicks" Daily Activity     Outcome Measure Help from another person eating meals?: A Little Help from another person taking  care of personal grooming?: A Little Help from another person toileting, which includes using toliet, bedpan, or urinal?: A Lot Help from another person bathing (including washing, rinsing, drying)?: A Lot Help from another person to put on and taking off regular upper body clothing?: A Lot Help from another person to put on and taking off regular lower body clothing?: A Lot 6 Click Score: 14   End of Session Equipment Utilized During Treatment: Rolling walker (2 wheels);Gait belt Nurse Communication: Mobility status  Activity Tolerance: Patient tolerated treatment well Patient left: in bed;with call bell/phone within reach;with bed alarm set  OT Visit Diagnosis: Unsteadiness on feet (R26.81);Other abnormalities of gait and mobility (R26.89);Muscle weakness (generalized) (M62.81)                Time: 5409-8119 OT Time Calculation (min): 26 min Charges:  OT General Charges $OT Visit: 1 Visit OT Evaluation $OT Eval Moderate Complexity: 1 Mod OT Treatments $Self Care/Home Management : 8-22 mins  Sherea Liptak K, OTD, OTR/L [facility] [phone]   Benedict Brain Koonce 11/13/2023, 12:45 PM

## 2023-11-13 NOTE — Plan of Care (Signed)

## 2023-11-13 NOTE — Consult Note (Signed)
 WOC consulted, patient to be DC back to [facility] today, no care orders written for that reason Meital Riehl [facility], CNS, CWON-AP [phone]

## 2023-11-13 NOTE — Progress Notes (Signed)
 [facility] Progress Note   Subjective:   Patient seen in room, NT attempting to reposition. He is alert and intermittently following commands. He denies SOB, CP, dizziness. States he is ready to go home.   Objective Vitals:   11/12/23 2044 11/12/23 2349 11/13/23 0350 11/13/23 0752  BP: 112/75 115/67 138/82 (!) 124/59  Pulse: 69 74 81 75  Resp: 18 18 18    Temp: 97.8 F (36.6 C) 98.7 F (37.1 C) 97.8 F (36.6 C) 97.6 F (36.4 C)  TempSrc: Oral Oral Oral Oral  SpO2: 100% 100% 100% 100%  Weight:      Height:       Physical Exam General: Alert male in NAD Heart: RRR, no murmurs, rubs or gallops Lungs: CTA bilaterally, respirations unlabored on RA Abdomen: Soft, non-distended, +BS Extremities: No edema b/l lower extremities Dialysis Access:  LUE AVG + t/b  Additional Objective Labs: Basic Metabolic Panel: Recent Labs  Lab 11/11/23 1349 11/12/23 2053 11/13/23 0941  NA 137 136 135  K 4.2 4.3 4.6  CL 93* 95* 98  CO2 29 24 19*  GLUCOSE 84 123* 125*  BUN 21 29* 32*  CREATININE 7.54* 9.03* 9.22*  CALCIUM  7.8* 7.4* 7.4*   Liver Function Tests: Recent Labs  Lab 11/11/23 1349 11/12/23 2053 11/13/23 0941  AST 27 24 27   ALT 13 12 8   ALKPHOS 69 63 61  BILITOT 0.8 0.8 0.8  PROT 7.1 7.1 6.4*  ALBUMIN 3.5 3.6 3.3*   No results for input(s): "LIPASE", "AMYLASE" in the last 168 hours. CBC: Recent Labs  Lab 11/11/23 1349 11/12/23 2053 11/13/23 0941  WBC 4.9 6.0 6.6  NEUTROABS 3.8  --  5.2  HGB 11.5* 12.1* 12.6*  HCT 36.9* 38.4* 39.0  MCV 92.7 91.2 91.1  PLT 139* 129* 118*   Blood Culture No results found for: "SDES", "SPECREQUEST", "CULT", "REPTSTATUS"  Cardiac Enzymes: No results for input(s): "CKTOTAL", "CKMB", "CKMBINDEX", "TROPONINI" in the last 168 hours. CBG: Recent Labs  Lab 11/12/23 2045 11/12/23 2352 11/13/23 0202 11/13/23 0354 11/13/23 0829  GLUCAP 123* 110* 174* 136* 99   Iron Studies: No results for input(s): "IRON",  "TIBC", "TRANSFERRIN", "FERRITIN" in the last 72 hours. @lablastinr3 @ Studies/Results: US  Abdomen Limited RUQ (LIVER/GB) Result Date: 11/11/2023 CLINICAL DATA:  Abdominal pain. EXAM: ULTRASOUND ABDOMEN LIMITED RIGHT UPPER QUADRANT COMPARISON:  CT scan of same day. FINDINGS: Gallbladder: No cholelithiasis or sonographic Murphy's sign is noted. Moderate gallbladder wall thickening is noted at 6 mm. Common bile duct: Diameter: 6 mm which is within normal limits. Liver: Nodular hepatic contours are noted suggesting hepatic cirrhosis. No definite focal abnormality is noted. Portal vein is patent on color Doppler imaging with normal direction of blood flow towards the liver. Other: None. IMPRESSION: Moderate gallbladder wall thickening is noted without cholelithiasis. Potentially this may represent acalculous cholecystitis or be secondary to adjacent hepatocellular disease or cirrhosis. HIDA scan may be performed for further evaluation. Electronically Signed   By: Rosalene Colon M.D.   On: 11/11/2023 15:21   CT ABDOMEN PELVIS WO CONTRAST Result Date: 11/11/2023 CLINICAL DATA:  Bowel obstruction suspected EXAM: CT ABDOMEN AND PELVIS WITHOUT CONTRAST TECHNIQUE: Multidetector CT imaging of the abdomen and pelvis was performed following the standard protocol without IV contrast. RADIATION DOSE REDUCTION: This exam was performed according to the departmental dose-optimization program which includes automated exposure control, adjustment of the mA and/or kV according to patient size and/or use of iterative reconstruction technique. COMPARISON:  CT of the chest abdomen  and pelvis performed March 09, 2020 FINDINGS: Lower chest: Enlarged heart.  Trace left pleural effusion. Hepatobiliary: Motion related changes in the upper abdomen. The gallbladder is contracted and demonstrates diffuse wall thickening. Possible pericholecystic fluid. Small volume perihepatic ascites. Pancreas: Unremarkable. No pancreatic ductal  dilatation or surrounding inflammatory changes. Spleen: Normal in size without focal abnormality. Adrenals/Urinary Tract: Cortical thinning is present in both kidneys. Cystic changes are present bilaterally with the largest cyst in the right kidney estimated at 2.3 cm. The urinary bladder is contracted and demonstrates diffuse wall thickening. Stomach/Bowel: The stomach is nondilated. No dilated loops of small bowel are appreciated. No evidence of appendicitis. Vascular/Lymphatic: Extensive vascular calcifications are present. No evidence of significant lymphadenopathy. Reproductive: Nothing significant. Other: Diastasis of the ventral abdominal wall. Musculoskeletal: Degenerative changes in the imaged osseous structures. IMPRESSION: 1. Gallbladder wall thickening with possible cholecystic fluid. Consider dedicated right upper quadrant ultrasound to evaluate for acute cholecystitis. 2. No evidence of bowel obstruction. 3. Renal atrophy with contracted urinary bladder with diffuse uniform wall thickening. This may represent sequelae of chronic renal disease. Electronically Signed   By: Reagan Camera M.D.   On: 11/11/2023 13:24   DG Chest Portable 1 View Result Date: 11/11/2023 CLINICAL DATA:  Cough and chest pain EXAM: PORTABLE CHEST 1 VIEW COMPARISON:  X-ray 12/22/2020 and older FINDINGS: Right upper chest battery pack with pacemaker leads overlying the right side of the heart. Similar configuration to previous. Enlarged cardiopericardial silhouette with calcified aorta. Overlapping cardiac leads. Film is rotated and tilted. The left inferior costophrenic angle is clipped off the edge of the film. There is some increasing left retrocardiac opacity. Subtle infiltrate is possible. No pneumothorax or edema. IMPRESSION: Enlarged heart.  Pacemaker. Left retrocardiac opacity. Subtle infiltrate is possible. Recommend follow-up. Electronically Signed   By: Adrianna Horde M.D.   On: 11/11/2023 12:08   Medications:    aspirin   81 mg Oral QHS   calcium  acetate  667 mg Oral TID   carvedilol   3.125 mg Oral BID WC   Chlorhexidine  Gluconate Cloth  6 each Topical Q0600   cholecalciferol   1,000 Units Oral Daily   [START ON 11/21/2023] cyanocobalamin   1,000 mcg Intramuscular Q30 days   ezetimibe   10 mg Oral QPM   finasteride   5 mg Oral Daily   folic acid   2 mg Oral Daily   heparin  injection (subcutaneous)  5,000 Units Subcutaneous Q8H   multivitamin  1 tablet Oral Daily   ondansetron  (ZOFRAN ) IV  4 mg Intravenous Once   polyethylene glycol  17 g Oral BID   senna-docusate  2 tablet Oral Daily    Dialysis Orders: MWF [place]  3h   B450   88.2kg   LUE AVG   Heparin  none  Last OP HD 5/14, post wt 88.5kg, getting to dry wt last 3 sessions  Assessment/Plan: Abd pain: w/ nausea, vomiting. Unable to get HIDA scan this weekend. Possibly discharging. Per PMD  ESRD: on HD MWF but missed Friday. Was unable to get HD yesterday. K+ and respiratory status are stable. If he remains admitted, will plan for HD tonight. If discharges, can return to his regular HD unit tomorrow. HTN: bp's stable 140/80 range Volume: euvolemic on exam, BP stable. UF to EDW as tolerated Anemia of esrd: Hb 12.6, no ESA indicated Secondary hyperparathyroidism: CCa a bit low, add on phos, continue calcium  acetate   Ramona Burner, PA-C 11/13/2023, 11:25 AM  [facility] Pager: [phone]

## 2023-11-22 LAB — HEPATITIS B SURFACE ANTIBODY, QUANTITATIVE

## 2023-12-20 NOTE — Progress Notes (Signed)
 Referring Provider: Wendell Boone Skates, Md [address] [place],  [place] [address]  Primary Care Provider: Valma Carwin, MD [address] [facility] [address] [place] [facility] [place] [id number]   Patient Identification: Gregory Hood is a 73 y.o. male here for follow-up of his atrial fibrillation. To review his history:   PMH significant for HTN, ESRD on HD, HFpEF, SSS s/p MDT DC PM 2016, strokes (7), vascular dementia, NSVT, and EF with regional WMA.    Patient is wheelchair-bound and has resided at [facility] since 2020. Gregory Hood was hospitalized at OSH and treated for PNA 10/22-10/27, then hospitalized at [facility] 10/30-11/6 with acute metabolic encephalopathy in the setting of resolving PNA infection. CT head without acute abnormalities at that time. No infectious symptoms and ID did not recommend antibiotics.   He was discharged back to SNF on 11/6 and since that time has had increasing confusion and several falls. His wife estimates about 6 falls in the last month. These are sometimes associated with transfers (ie, wheelchair to toilet). Patient mentions some dizziness but overall unable to speak to what he thinks is leading to falls. During most recent fall, SNF had concern for head injury so he presented to ED where head CT was negative for acute abnormality.   While in ED, he had an episode of VT lasting almost a minute followed by several short runs of NSVT overnight. His wife saw his eyes rolls back into his head at one point in what she described as seizure-like activity lasting approximately 1 minute. She is not sure if this correlated with the wide-complex tachycardia, but it was reminscent of his symptoms prior to PPM implantation. Subsequent device interrogation confirmed NSVT as well as AT/AF burden of ~6h/day (new for the patient). TTE showed EF 50-55% with mild basal-inferior HK. Additional findings of RV dilation, mild  BAE, mod-severe TR, severely elevated PASP. Patient has not complained of any chest pain or shortness of breath. Sometimes gets hypotensive after dialysis, but BP generally normal.  Interval History:  History of Present Illness Gregory Hood is a 73 year old male with atrial tachycardia and dialysis dependency who presents for follow-up after episodes of syncope. He is accompanied by his wife, Gregory Hood.  Patient was last seen 06/09/23 at the time of his discharge.  He was transferred to [facility] from an outside hospital with concern that non-sustained ventricular tachycardia was the cause of his frequent falls and confusion.  His device interrogation demonstrated that he has one short episode of NSVT in November of 2024, and he has not had any episodes since that time.  He also had no episodes of atrial fibrillation in the last several months.  Therefore, arrhythmias were not found to be the cause of his falls and confusion.  He was found to have an EF of 45%, and the decision in combination with his family was made to not pursue invasive evaluation of his mildly reduced ejection fraction, given his vascular dementia and functional status.  Neurology was also consulted during this hospitalization for evaluation of seizures as a cause of his declining functional status.  An EEG was performed, which demonstrated no indications of seizure activity.  The history was also not consistent with seizure.  Ultimately, his declining  functional status was felt to be due to progressive vascular dementia.  A palliative care consultation was performed, but the patient's family was not interested in pursuing palliative care at this time.  His hemodialysis was continued throughout the hospitalization.  He was discharged to a SNF.  At the time of discharge, he was regular rate and rhythm, normal S1 and S2, no murmurs/rubs/gallops, no lower extremity edema bilaterally.   11/11/23 due to nausea, vomiting and abdominal pain for 2  days, and admitted due to concern for possible acalculous cholecystitis. Patient is not a great historian. He had no fever or leukocytosis. No LFT elevation either. CT abdomen and pelvis without contrast showed gallbladder wall thickening with possible cholecystic fluid, and RUQ US  was recommended. RUQ US  showed moderate gallbladder wall thickening without cholelithiasis, and HIDA scan was recommended. General surgery consulted and evaluated patient. Patient has not had further nausea, vomiting or abdominal pain. He tolerated regular diet. He has not had fever or leukocytosis either. Acute cholecystitis felt to be unlikely. He was cleared for discharge by general surgery for outpatient follow-up. He will be returning to LTC   He is here today with his lovely wife with concerns of elevated BNP level.    He is currently undergoing dialysis on a Monday, Wednesday, Friday schedule. There is a concern about fluid accumulation with a reported elevated BNP level, although his weight has remained stable from October to now. No chest pain, shortness of breath, dizziness, or passing out.  Reports more fatigue recently.  Limited communication.  He has been experiencing weakness since June.  His current medications include Plavix , which he is taking for his history of stroke.  He has a history of multiple strokes and TIAs, which have affected his speech and vision. Since December, he has become more nonverbal and has difficulty communicating. His wife reports that he used to talk frequently but now struggles with speech.  He has a history of diabetes, which was discovered during a hospital visit after experiencing symptoms at a church convention. His family has a history of diabetes, including his parents and extended family.  He is currently residing in a nursing home in a private room, and his wife has installed a camera to monitor him. He has not had any recent falls, but there are concerns about his mobility and  strength. He is receiving physical therapy to maintain leg strength.  Since we last saw him, he denies chest pain, chest pressure/discomfort, dyspnea, exertional chest pressure/discomfort, fatigue, irregular heart beat, lower extremity edema, near-syncope, palpitations, and syncope.   Pertinent details of the medical, social and family history were reviewed and are unchanged except as noted.  Problem List:  Problem List  Date Reviewed: 12/20/2023        ICD-10-CM Priority Class Noted - Resolved Diagnosed   Altered mental status, unspecified altered mental status type R41.82   04/27/2023 - Present    Personal history of COVID-19 Z86.16   09/04/2021 - Present    Wide-complex tachycardia R00.0   10/08/2020 - Present    Other specified complication of vascular prosthetic devices, implants and grafts, subsequent encounter T82.898D   03/27/2020 - Present    Sepsis (CMS/HHS-HCC) A41.9   03/17/2020 - Present    Bacteremia due to methicillin susceptible Staphylococcus aureus (MSSA) R78.81, B95.61   03/11/2020 - Present    Hemorrhage due to vascular prosthetic devices, implants and grafts, initial encounter () T82.838A   03/11/2020 - Present    CHF with unknown LVEF (  CMS/HHS-HCC) I50.9   03/10/2020 - Present    Complication of arteriovenous dialysis fistula T82.9XXA   03/10/2020 - Present    Presence of arteriovenous fistula for hemodialysis () Z99.2   03/10/2020 - Present    Pruritus, unspecified L29.9   09/08/2018 - Present    Diarrhea, unspecified R19.7   09/01/2018 - Present    Mild protein-calorie malnutrition (CMS/HHS-HCC) E44.1   08/29/2018 - Present    Toenail fungus B35.1   08/22/2018 - Present    Anemia in chronic kidney disease N18.9, D63.1   08/11/2018 - Present    Coagulation defect, unspecified (CMS/HHS-HCC) D68.9   08/11/2018 - Present    End stage renal disease (CMS/HHS-HCC) N18.6   08/11/2018 - Present    Iron deficiency anemia, unspecified D50.9   08/11/2018 - Present    Liver disease, unspecified  K76.9   08/11/2018 - Present    Disorder of phosphorus metabolism, unspecified E83.30   08/11/2018 - Present    Pain, unspecified R52   08/11/2018 - Present    Secondary hyperparathyroidism of renal origin (CMS/HHS-HCC) N25.81   08/11/2018 - Present    Eyelid laceration S01.119A   07/30/2018 - Present    Fall on same level, unspecified, initial encounter W18.30XA   07/26/2018 - Present    Cellulitis of right leg L03.115   07/11/2018 - Present    Hypertensive urgency I16.0   07/11/2018 - Present    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      Chronic diastolic CHF (congestive heart failure), NYHA class 3 (CMS/HHS-HCC) I50.32   11/25/2016 - Present    Organ transplant candidate Z76.82   12/11/2015 - Present    Fitting or adjustment of cardiac pacemaker Z45.018   07/04/2015 - Present    Bilateral edema of lower extremity R60.0   03/26/2015 - Present    Weakness R53.1   03/25/2015 - Present    Cardiac pacemaker in situ Z95.0   03/25/2015 - Present    Overview Addendum 04/03/2023  8:35 PM by Ishmael Givens, RN  Medtronic Advisa DR MRI [id number] SN: [id number] Implanted: 24-Mar-2015 Atrial Lead: Medtronic 5076 CapSureFix Novus SN: [id number] Implanted: 24-Mar-2015 RV Lead: Medtronic 5076 CapSureFix Novus SN: [id number] Implanted: 24-Mar-2015      Sick sinus syndrome (CMS/HHS-HCC) I49.5   03/21/2015 - Present    TRD (traction retinal detachment), 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   12/03/2014 - Present    Vitreous hemorrhage of right eye (CMS/HHS-HCC) H43.11   08/29/2014 - Present    Overview Signed 09/26/2014  7:54 AM by Laurence Ip, MD  s/p 23/27g PPV/MP/TRD repair/EL OD  09/20/14      Proliferative diabetic retinopathy without macular edema associated with type 2 diabetes mellitus (CMS/HHS-HCC) Z88.6400   06/25/2014 - Present    Proliferative diabetic retinopathy without macular edema associated with diabetes mellitus due to underlying condition (CMS/HHS-HCC) Z91.6400   03/25/2014 - Present    H/O RD (retinal detachment) Z86.69   10/26/2013 - Present    Overview Signed 03/18/2014 10:05 AM by Mannie, Summer, COT  OS      History of ischemic multifocal  multiple vascular territories stroke Z86.73   10/09/2012 - Present    Visual field cut H53.40   08/23/2012 - Present    Chronic kidney disease N18.9   11/08/2011 - Present    Elevated homocysteine (CMS-HCC) R79.89   11/08/2011 - Present    Hyperlipidemia LDL goal <70, unspecified Z21.4   09/28/2011 - Present    Essential hypertension I10   09/28/2011 - Present    Obesity E66.9   09/28/2011 - Present    RESOLVED: Cardiac syncope R55   03/21/2015 - 10/20/2015    RESOLVED: Diabetes mellitus (CMS/HHS-HCC) E11.9   09/28/2011 - 04/17/2022     Medications: Current Outpatient Medications  Medication Sig Dispense Refill  . acetaminophen  (TYLENOL ) 500 MG tablet Take 500 mg by mouth once daily    . ascorbic acid, vitamin C, (VITAMIN C) 500 MG tablet Take 1 tablet by mouth    . aspirin  81 MG chewable tablet Take 81 mg by mouth nightly      . atropine  1 % ophthalmic solution Place 1 drop into both eyes at bedtime    . bisacodyL  (DULCOLAX) 10 mg suppository Place 10 mg rectally as needed    . calcium  acetate (PHOSLO ) 667 mg tablet Take 667 mg by mouth 3 (three) times daily before meals    . cinacalcet (SENSIPAR) 30 MG tablet Take 30 mg by mouth once daily    . clopidogreL  (PLAVIX ) 75 mg tablet Take 1 tablet (75 mg total) by mouth once daily Resume Plavix  12/18/20.    . cyanocobalamin  (VITAMIN B12) 1,000 mcg/mL injection Inject 1,000 mcg into the muscle monthly    . divalproex (DEPAKOTE) 125 MG DR tablet     . EPINEPHrine  (EPIPEN ) 0.3  mg/0.3 mL auto-injector Inject 0.3 mg into the muscle as needed    . ferrous sulfate 325 (65 FE) MG tablet Take 1 tablet by mouth once daily    . FUROsemide  (LASIX ) 40 MG tablet Take 40 mg by mouth once daily    . insulin  ASPART (NOVOLOG  FLEXPEN) pen injector (concentration 100 units/mL)     . lidocaine -prilocaine  (EMLA ) cream Apply 1 Application  topically every Monday, Wednesday, and Friday For Dialysis shunt    . losartan (COZAAR) 50 MG tablet Take 50 mg by mouth once daily    . losartan-hydroCHLOROthiazide (HYZAAR) 100-12.5 mg tablet Take 1 tablet by mouth once daily    . melatonin 3 mg tablet Take 3 tablets (9 mg total) by mouth at bedtime    . memantine  (NAMENDA ) 10 MG tablet Take 10 mg by mouth once daily    . polyethylene glycol (MIRALAX ) packet Take 17 g by mouth once daily    . prednisoLONE  acetate (PRED FORTE ) 1 % ophthalmic suspension     . RED YEAST RICE ORAL Take 2 tablets by mouth nightly.      SABRA spironolactone (ALDACTONE) 50 MG tablet Take 50 mg by mouth once daily    . traZODone (DESYREL) 50 MG tablet Take 1 tablet (50 mg total) by mouth at bedtime    . apixaban (ELIQUIS) 5 mg tablet Take 1 tablet (5 mg total) by mouth every 12 (twelve) hours (Patient not taking: Reported on 12/20/2023) 60 tablet 11  . b complex multivitamin (RENA-VITE) 0.8 mg Take 1 tablet by mouth once daily (Patient not taking: Reported on 12/20/2023)    . carvediloL  (COREG ) 25 MG tablet Take 25 mg by mouth 2 (two) times daily with meals Off HD days (Tue,Thu, Sat, Sun) and 25 mg at  bedtime on Monday, Wednesday and Fridays (Patient not taking: Reported on 12/20/2023)    . cholecalciferol  (VITAMIN D3) 1000 unit capsule Take 1,000 Units by mouth once daily (Patient not taking: Reported on 12/20/2023)    . ezetimibe  (ZETIA ) 10 mg tablet Take 1 tablet (10 mg total) by mouth once daily 30 tablet 0  . finasteride  (PROSCAR ) 5 mg tablet Take 5 mg by mouth once daily (Patient not taking: Reported on 12/20/2023)    . folic  acid (FOLVITE ) 1 MG tablet Take 2 tablets (2 mg total) by mouth nightly. Pt takes two tabs daily (Patient not taking: Reported on 12/20/2023) 60 tablet 1  . niacin  100 MG tablet Take 200 mg by mouth every evening (Patient not taking: Reported on 12/20/2023)    . ondansetron  (ZOFRAN ) 4 MG tablet Take by mouth every 6 (six) hours (Patient not taking: Reported on 12/20/2023)    . sennosides (SENOKOT) 8.6 mg tablet Take 2 tablets by mouth nightly (Patient not taking: Reported on 12/20/2023)     No current facility-administered medications for this visit.    An updated, reconciled list of all medications, including OTC and herbal, was reviewed and a copy of the updated list was provided to the patient. Treatment plan and medications were discussed with the patient who voiced understanding. No barriers to learning.  Allergies: Allergies  Allergen Reactions  . Insect Venom Anaphylaxis    Ant bites=anaphylaxis  . Ace Inhibitors Cough  . Regadenoson  Other (See Comments)    Seizure like-activity after lexiscan  given.  SABRA Statins-Hmg-Coa Reductase Inhibitors Muscle Pain    Myalgias and CK increase.  Tried both atorvastatin  and rosuvastatin   . Venom-Honey Bee Swelling and Other (See Comments)  . Wasp Venom Swelling    Multiple bees    ROS: Review of systems otherwise negative except as indicated above in the history.  Physical Exam:  Vitals: BP 117/65 (BP Location: Left upper arm, Patient Position: Sitting, BP Cuff Size: Adult)   Pulse 73   Ht 177.8 cm (5' 10)   SpO2 99%   BMI 27.81 kg/m   BMI: Body mass index is 27.81 kg/m.  General Appearance:  Appears well, no distress; Alert and oriented x 3     Lungs:   clear to auscultation, without rales or wheeze, good air exchange  Heart:  RRR no MRG  Abdomen:   Soft, non-tender, bowel sounds active,  Extremities: Extremities normal, no cyanosis. 1+ edema Bilateral   Pulses: Radials 2+ and symmetric bilaterally  Skin: No rashes or ulcers   Neurologic: Alert, interactive, and appropriate, grossly moving all 4 extremities    12 lead ECG:  Results for orders placed or performed in visit on 12/20/23  ECG 12-lead   Collection Time: 12/20/23 10:11 AM  Result Value Ref Range   Vent Rate (bpm) 71    QRS Interval (msec) 192    QT Interval (msec) 510    QTc (msec) 554    VP with AT vs Atrial flutter  Labs: Hematology Lab Results  Component Value Date   WBC 4.3 06/09/2023   HGB 10.9 (L) 06/09/2023   HCT 35.2 (L) 06/09/2023   MCV 91 06/09/2023   PLT 225 06/09/2023    Chemistries Lab Results  Component Value Date   CREATININE 5.5 (H) 06/09/2023   BUN 18 06/09/2023   NA 135 06/09/2023   K 3.7 06/09/2023   CL 96 (L) 06/09/2023   CO2 25 06/09/2023    Liver Function Studies Lab Results  Component Value Date   ALT 12 (L) 06/04/2023   AST 23 06/04/2023   ALKPHOS 60 06/04/2023    Thyroid Function Studies Lab Results  Component Value Date   TSH 2.44 04/29/2023    INR Lab Results  Component Value Date   INR 1.1 06/04/2023   INR 1.2 (H) 04/28/2023   INR 1.2 (H) 04/27/2023   INR 1.0 09/09/2022   INR 1.0 04/24/2017   INR 1.1 03/20/2015   INR 1.0 11/23/2012    Assessment/plan:  1). Atrial arrhythmia - Today he is in atrial flutter vs tachycardia.  This could contribute to his weakness.  Will plan for a TEE/DCCV to reassess symptoms.  Rates are overall controlled on histograms.  Previous mention of device interrogation at OSH showed 6h burden of AT/AF daily. Not detected on ECG. No previous diagnosis of same.   2). Anti-coagulation - The patient's current CHADS-VASC risk score is 6 for Congestive heart failure or low EF (1), Hypertension (1), Diabetes (1), Prior stroke or TIA or embolism (2), and Age 75-74 (1). He is anti-coagulated with apixiban. His anti-coagulation will be initiated. Will d/w Dr. Wendell stopping Plavix   3). SSS s/p MDT DC PM Stable battery and lead function.  No arrhythmogenic etiology  of syncope/falls. Will ensure he has remote monitoring set up.   4) NSVT None on interrogation  5). Chronic heart failure with preserved ejection fraction TTE at OSH showed EF 50-55 with inferobasal WMA and new finding of moderate-severe TR with elevated PASP, mildly enlarged RV with normal systolic function. TTE at [facility] revealed EF 45% calc EF (3D) 53%, G2DD, moderate TR, dilated ascending aorta (4.3 cm) . Held goals of care discussion with family who were in agreement that they would not want to seek any invasive diagnostic or treatments at this time and requested palliative care consultation. As such, ischemic evaluation was declined by family.  Will continue daily Lasix  (oliguric at baseline). Continue HD for volume management. Continue home carvedilol  and losartan. Discussed that BNP measurements are not usually helpful while on HD and weights are beneficial in management of fluid accumulation.  Follow-up:  as planned in October with Dr. Wendell Ellison Statement:   I personally performed the service, non-incident to. (WP)   ALISA BRETT LIKE, NP I spent a total of in both face-to-face and non-face-to-face activities, excluding procedures performed, for this visit on the date of this encounter. This note has been created using automated tools and reviewed for accuracy by [facility].

## 2023-12-23 NOTE — Progress Notes (Addendum)
 PRE-PROCEDURE PHONE CALL  Yes Patient called to remind of appointment scheduled for TEE/DCCV on 12/27/2023 at  1000.   Yes Pt instructed to check in to Room 7416 for appointment at  1000 Yes Pt instructed on driver present for procedure and to drive home  Yes Pt instructed nothing to eat or drink after midnight Yes Pt instructed may take medications with sip of water in am  Yes Pt confirms compliance with anticoagulation medications and no missed doses within the last month No  Pt denies any problems with swallowing or history of esophageal strictures No Pt confirms diagnosis of sleep apnea, or suspected sleep apnea (snoring, or witnessed apnea) No Electrical stimulator implantable device for sleep apnea present**Provider made aware of implanted devices* YES Other Electrical stimulators or implantable devices present--listed below **Provider made aware of implanted devices* No Patient takes GLP-1 Agonoists **If yes, Patient instructed clear liquids only day prior to procedure, NPO at midnight**   PPM - [employer] 03/24/15

## 2024-02-25 NOTE — Progress Notes (Signed)
 Left without being seen.

## 2024-02-27 NOTE — Progress Notes (Signed)
  Gregory Hood is a 73 y.o. male    1. Low vision right eye category 1, blindness left eye category 5   2. TRD (traction retinal detachment), right       The Chief Complaint, HPI, ROS, and PFSHx as documented were reviewed with the patient and updated as appropriate. Previously seen by Dr. Rutherford and Dr. Dyana, but now on dialysis MWF and cannot see Dr. Rutherford. Here for PDR f/u. 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 - s/p PRP OD, no hx of injections - s/p 23/27g PPV/MP/EL OD (mahmoud/chen) 09/20/14 Today [facility] 20/160 OCT borderline quality, ERM with some IRF overall improved Pt overall not interested in intraocular injections  # Pthisis Bulbi OS # Monocular - Hx RD repair OS at outside institution March/April 2015 complicated by IOP spikes, cataract extraction, resultant NLP vision from chronic RD - Monocular precautions discussed Recent hx of periocular trauma - with ecchymosis and edema and now resolved (seen in photos) Pred forte  bid os and atropine  bid as needed for pain   # Dry eye, moderate/severe - Start preservative free artificial tears QID OU  Plan Observe   Rtc 4 months for dfe/oct ou   Saw [facility]  The Chief Complaint, HPI, ROS, and PFSHx as documented were reviewed with the patient and updated as appropriate.

## 2024-02-28 NOTE — Progress Notes (Signed)
  Gregory Hood is a 73 y.o. 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 No epi defect or acute cornea issues - explained the condition to Patient and caregiver   # Dry eye, moderate/severe - preservative free artificial tears QID OU  RTC cornea PRN Retina as scheduled

## 2024-03-06 NOTE — Progress Notes (Signed)
 PRE-PROCEDURE PHONE CALL  Spoke with pts son. Yes Patient called to remind of appointment scheduled for DCCV on9/04/2024  at  0830 AM.    Yes Pt instructed to check in to Room 7416 for appointment at 0830 am . Yes Pt instructed on driver present for procedure and to drive home  Yes Pt instructed nothing to eat or drink after midnight Yes Pt instructed may take medications with sip of water in am  Yes Pt confirms compliance with anticoagulation medications and no missed doses within the last month No  Pt denies any problems with swallowing or history of esophageal strictures No Pt confirms diagnosis of sleep apnea, or suspected sleep apnea (snoring, or witnessed apnea) No Electrical stimulator implantable device for sleep apnea present**Provider made aware of implanted devices* Yes Other Electrical stimulators or implantable devices present--listed below **Provider made aware of implanted devices* No Patient takes GLP-1 Agonoists **If yes, Patient instructed clear liquids only day prior to procedure, NPO at midnight**   PPM [employer]

## 2024-03-08 NOTE — Progress Notes (Signed)
 Pre Procedure CDU  Name: Gregory Hood  MRN: [id number]  DOB: 05-10-1951  Age: 73 y.o. Vital Signs: BP (!) 144/79   Pulse 71   Resp (!) 0   SpO2 100%  Consent Obtained: Child  Lab Results  Component Value Date   WBC 4.3 06/09/2023   HCT 35.2 (L) 06/09/2023   MCV 91 06/09/2023   PLT 225 06/09/2023   CREATININE 5.5 (H) 06/09/2023   BUN 18 06/09/2023   NA 137 03/08/2024   K 3.7 06/09/2023   CL 96 (L) 06/09/2023   CO2 25 06/09/2023   MG 2.1 06/09/2023   INR 1.1 06/04/2023   Mallampati Classification: III (soft and hard palate and base of uvula visible)  ASA Classification: III - Severe disease  Planned Procedure: DCCV  Anesthesia: Moderate Sedation  Post Procedure Plan: Discharge to Home  CDU Attending: Maree Dancer    Post Procedure CDU  Name: Henderson Frampton  MRN: [id number]  DOB: 1951/06/01  Age: 73 y.o. Vital Signs: BP (!) 135/83   Pulse 80   Resp (!) 0   SpO2 100%    Procedure: DCCV  Anesthesia: Moderate Sedation  Complications: None  Estimated Blood Loss: N/A  Specimen Removed: N/A  Final Disposition: Discharge to Home  CDU Attending: Maree Dancer  Full report can be found in the procedure tab once final.    Sidra Dearth, MD Cardiology Fellow

## 2024-03-08 NOTE — Anesthesia Postprocedure Evaluation (Signed)
 Discharge from Anesthesia Care  Patient: Gregory Hood  Procedures performed: CARD CARDIOVERSION  Anesthesia type: general Vitals Value Taken Time  BP    Temp    Pulse    Resp    SpO2     *If vital value is absent from grid, please refer to corresponding flowsheet vitals data  Anesthesia Post Evaluation  Patient participation: complete - patient participated Regional recovery expected within 48 hours: complete Level of consciousness: oriented x3 Pain management: adequate Airway patency: patent Respiratory status: spontaneous ventilation Cardiovascular status: hemodynamically stable Hydration status: WDL PONV: none    Recent Flowsheet Information: No vitals data found for the desired time range.

## 2024-03-08 NOTE — Progress Notes (Signed)
 Anesthesia Transfer of Care Note  Patient: Gregory Hood  Procedures performed: CARD CARDIOVERSION  Report given/handover to: Telemetry/Step Down Unit Nurse

## 2024-04-18 NOTE — Progress Notes (Signed)
 [facility]   PRIMARY CARE PROVIDER: Valma Carwin, MD [address] [place] [facility] [place] [address]   PATIENT PROFILE: Gregory Hood is a 74 y.o. male who presents today  HISTORY OF PRESENT ILLNESS:   Chart review: NAREG BREIGHNER Hood is a 73 y.o. male with a history of HTN, ESRD on HD, HFpEF, SSS s/p DC PM 2016 R sided, strokes (7), who has been referred for management of his DC PM . He was last seen by me on 04/06/23. At that time he was doing well with no issues. Remote monitoring found new onset AT/AFL with low burden in June, no signficant symptoms. Started on apixaban. Had recurrence in August, repeat DCCV in September. Here to discuss rhythm control.  History of Present Illness JOHNROBERT FOTI Hood is a 73 year old male with atrial fibrillation and a pacemaker who presents with persistent atrial arrhythmia and increased ventricular pacing. He is accompanied by the fleeta driver from his facility, [facility].  He has been experiencing persistent atrial fibrillation despite undergoing cardioversion procedures twice in the past two months. He feels worse after the cardioversion, and his atrial fibrillation has been persistent since the last procedure.  He is currently pacing from his pacemaker approximately 75% of the time, a significant increase from 7% last year. His pacemaker has about a year of battery life remaining.  He attends dialysis three times a week and missed his session today due to the appointment. Since December, he has experienced hand contractures, which began after a hospitalization. There is concern about fluid management during dialysis sessions.  He has a history of strokes and is currently on dialysis. He has not experienced episodes of heart rate dropping to seizure-like levels in the past four months, with the last documented episode occurring in December after a hospitalization.  He reports generalized pain. He  is unable to walk independently and requires assistance with transportation.  REVIEW OF SYSTEMS:    A complete review of systems was completed. All pertinent positives and negatives are noted in the HPI. All other systems were negative. Answers submitted by the patient for this visit: Recent Medical Symptoms (Submitted on 04/18/2024) Night sweats: No Appetite Loss: No Weight loss: No Fever: No Daytime sleepiness: Yes visual change: No Hearing loss: No Sore throat: No Hoarseness: No Cough: No Hemoptysis (Coughing up blood): No Shortness of breath: No Wheezing: No Chest pain: No Tachycardia (heart racing): No leg pain: No Nausea: Yes Vomiting: No Diarrhea: No Constipation: Yes Abdominal pain: No Bowel habits change: No Melena (Black tar-like stool): No Dysuria (Pain with urination): No Frequency (The need to urinate many times a day): No Hesitancy (Difficulty in beginning the flow of urine): No Joint pain: No Joint swelling: No Myalgias (Muscle pain): No Rash: No Pigment changes/discoloration: No Memory loss: No Syncope (Fainting or passing out): No Extremity weakness: No Paresthesias (Pins and Needles): No Loss of balance: No   ALLERGIES:   Allergies  Allergen Reactions  . Insect Venom Anaphylaxis    Ant bites=anaphylaxis  . Ace Inhibitors Cough  . Regadenoson  Other (See Comments)    Seizure like-activity after lexiscan  given.  SABRA Statins-Hmg-Coa Reductase Inhibitors Muscle Pain    Myalgias and CK increase.  Tried both atorvastatin  and rosuvastatin   . Venom-Honey Bee Swelling and Other (See Comments)  . Wasp Venom Swelling    Multiple bees    MEDICATIONS:  Current Outpatient Medications  Medication Sig Dispense Refill  .  acetaminophen  (TYLENOL ) 500 MG tablet Take 500 mg by mouth once daily    . aspirin  81 MG chewable tablet Take 81 mg by mouth nightly      . atropine  1 % ophthalmic solution Place 1 drop into both eyes at bedtime    . bisacodyL   (DULCOLAX) 10 mg suppository Place 10 mg rectally as needed    . calcium  acetate (PHOSLO ) 667 mg tablet Take 667 mg by mouth 3 (three) times daily before meals    . clopidogreL  (PLAVIX ) 75 mg tablet Take by mouth    . dext 70/polycarbophil/peg/NaCl (ARTIFICIAL TEAR SOLUTION OPHTH) Apply 1 drop to eye 4 (four) times daily For dry eyes    . divalproex (DEPAKOTE) 125 MG DR tablet 125 mg once daily Pt takes every afternoon for agitation from dementia    . EPINEPHrine  (EPIPEN ) 0.3 mg/0.3 mL auto-injector Inject 0.3 mg into the muscle as needed    . ferrous sulfate 325 (65 FE) MG tablet Take 1 tablet by mouth once daily    . finasteride  (PROSCAR ) 5 mg tablet Take 5 mg by mouth once daily    . folic acid  (FOLVITE ) 1 MG tablet Take 2 tablets (2 mg total) by mouth nightly. Pt takes two tabs daily 60 tablet 1  . lidocaine -prilocaine  (EMLA ) cream Apply 1 Application  topically every Monday, Wednesday, and Friday For Dialysis shunt    . losartan (COZAAR) 50 MG tablet Take 50 mg by mouth once daily    . losartan-hydroCHLOROthiazide (HYZAAR) 100-12.5 mg tablet Take 1 tablet by mouth once daily    . melatonin 3 mg tablet Take 3 tablets (9 mg total) by mouth at bedtime    . memantine  (NAMENDA ) 10 MG tablet Take 10 mg by mouth once daily    . polyethylene glycol (MIRALAX ) packet Take 17 g by mouth once daily    . prednisoLONE  acetate (PRED FORTE ) 1 % ophthalmic suspension     . RED YEAST RICE ORAL Take 2 tablets by mouth nightly.      SABRA spironolactone (ALDACTONE) 50 MG tablet Take 50 mg by mouth once daily    . traMADoL  (ULTRAM ) 50 mg tablet Take 50 mg by mouth every 6 (six) hours as needed for Pain    . traZODone (DESYREL) 50 MG tablet Take 1 tablet (50 mg total) by mouth at bedtime    . apixaban (ELIQUIS) 5 mg tablet Take 1 tablet (5 mg total) by mouth every 12 (twelve) hours (Patient not taking: Reported on 02/28/2024) 60 tablet 11  . ascorbic acid, vitamin C, (VITAMIN C) 500 MG tablet Take 1 tablet by mouth     . b complex multivitamin (RENA-VITE) 0.8 mg Take 1 tablet by mouth once daily (Patient not taking: Reported on 02/28/2024)    . carvediloL  (COREG ) 25 MG tablet Take 25 mg by mouth 2 (two) times daily with meals Off HD days (Tue,Thu, Sat, Sun) and 25 mg at bedtime on Monday, Wednesday and Fridays (Patient not taking: Reported on 02/28/2024)    . cholecalciferol  (VITAMIN D3) 1000 unit capsule Take 1,000 Units by mouth once daily (Patient not taking: Reported on 12/20/2023)    . cinacalcet (SENSIPAR) 30 MG tablet Take 30 mg by mouth once daily (Patient not taking: Reported on 02/24/2024)    . cyanocobalamin  (VITAMIN B12) 1,000 mcg/mL injection Inject 1,000 mcg into the muscle monthly    . ezetimibe  (ZETIA ) 10 mg tablet Take 1 tablet (10 mg total) by mouth once daily 30 tablet 0  .  FUROsemide  (LASIX ) 40 MG tablet Take 40 mg by mouth once daily    . insulin  ASPART (NOVOLOG  FLEXPEN) pen injector (concentration 100 units/mL)     . multivitamin tablet Take 1 tablet by mouth once daily    . niacin  100 MG tablet Take 200 mg by mouth every evening (Patient not taking: Reported on 12/20/2023)    . ondansetron  (ZOFRAN ) 4 MG tablet Take by mouth every 6 (six) hours (Patient not taking: Reported on 12/20/2023)    . sennosides (SENOKOT) 8.6 mg tablet Take 2 tablets by mouth nightly (Patient not taking: Reported on 12/20/2023)     No current facility-administered medications for this visit.    PHYSICAL EXAM: Vital Signs: BP 134/70 (BP Location: Right upper arm, Patient Position: Sitting, BP Cuff Size: Adult)   Pulse 73   Resp 17   Ht 177.8 cm (5' 10)   Wt 83 kg (182 lb 14.4 oz)   BMI 26.24 kg/m  General appearance: He appears well, in no apparent distress, Sitting in wheel chair, responsive, but short answers and not engaged in conversation. Neurological: Moves all extemities, no focal deficits noted Lungs: clear to auscultation, no wheezes, rales or rhonchi, symmetric air entry  CV: normal rate, regular rhythm,  normal S1, S2, no murmurs, rubs, clicks or gallops. Abdomen: soft, nontender, nondistended, no masses or organomegaly  Musculoskeletal: no joint tenderness, deformity or swelling  Extremities: warm and well-perfused, no pedal edema, no clubbing or cyanosis   ACCESSORY CLINICAL FINDINGS:    12 lead EKG today I have personally reviewed and interpreted the 12 lead EKG today which demonstrated V paced at 74 The intervals were as follows: PR NA, QRS duration 200, and corrected QT 528 msec.  12/20/23: atrial flutter  Device interrogation: Please see Pacemate for full report. Briefly AF burden 69.6% (AF not flutter), 1 year battery, 74.8% Vpaced, 37% A paced.   Echocardiography (06/07/23) demonstrated mildly reduced left ventricular function with an LVEF of 45%. The left atrial diameter was 5.1 cm.  Laboratory Data: Lab Results  Component Value Date   WBC 4.3 06/09/2023   HGB 10.9 (L) 06/09/2023   HCT 35.2 (L) 06/09/2023   MCV 91 06/09/2023   PLT 225 06/09/2023     Chemistry      Component Value Date/Time   NA 137 03/08/2024 0908   NA 135 06/09/2023 0624   NA 138 08/23/2012 1207   K 3.7 06/09/2023 0624   K 4.5 08/23/2012 1207   CL 96 (L) 06/09/2023 0624   CL 103 08/23/2012 1207   CO2 25 06/09/2023 0624   BUN 18 06/09/2023 0624   CREATININE 5.5 (H) 06/09/2023 0624   CREATININE 2.4 (H) 08/23/2012 1207   GLUCOSE 82 06/09/2023 0624   GLUCOSE 186 (H) 08/23/2012 1207      Component Value Date/Time   CALCIUM  9.3 06/09/2023 0624   CALCIUM  9.1 08/23/2012 1207   ALKPHOS 60 06/04/2023 1713   ALKPHOS 79 08/23/2012 1207   AST 23 06/04/2023 1713   AST 32 08/23/2012 1207   ALT 12 (L) 06/04/2023 1713   ALT 25 08/23/2012 1207   TBILI 0.6 06/04/2023 1713   TBILI 0.5 08/23/2012 1207      Lab Results  Component Value Date   TSH 2.44 04/29/2023    ASSESSMENT AND PLAN:  Assessment & Plan Persistent atrial fibrillation with dual-chamber pacemaker and sick sinus syndrome Persistent  atrial fibrillation with increased ventricular pacing. V pacing increase correlates with AF episodes (prior V pacing in  NSR 7%).  Previous cardioversions have not maintained normal rhythm. Consideration of amiodarone to maintain normal rhythm and reduce ventricular pacing. Risks of amiodarone include long-term effects on liver, lungs, and thyroid. Shared decision-making with family, who are the decision-makers due to his health issues. The goal is to reduce atrial fibrillation and ventricular pacing to improve heart function. If amiodarone is not effective, AV node ablation and pacemaker upgrade may be considered. - Start amiodarone: 400 BID x 7d, then 200mg  daily - Schedule remote transmission in six weeks to assess rhythm. - Consider cardioversion if not converted to normal rhythm in six weeks. - Monitor for side effects of amiodarone, including liver, lung, and thyroid function.  Heart failure with mildly reduced ejection fraction Heart failure with ejection fraction at 45%. Increased ventricular pacing potentially related to atrial fibrillation. Aim to minimize ventricular pacing to improve heart function. - Monitor ventricular pacing and heart function in conjunction with atrial fibrillation management.  Diagnoses and all orders for this visit:  Cardiac pacemaker in situ -     Follow up in Cardiology -     ECG 12-lead  Sick sinus syndrome (CMS/HHS-HCC) -     ECG 12-lead  Persistent atrial fibrillation (CMS/HHS-HCC) -     ECG 12-lead    I spent a total of 30 minutes in both face-to-face and non-face-to-face activities, excluding procedures performed, for this visit on the date of this encounter.        Follow-up     Normal Orders This Visit   Follow up in Cardiology [REF12F Custom]    Questions:   Who is this follow-up with?: Me   What visit type is needed?: EP Return   Does this order need to be coordinated with another visit or should it be hidden from the patient portal?  If yes to either, the patient will need to stop by the front desk or call to schedule.: No   Can this appointment be overbooked by a scheduler?: No         This note has been created using automated tools and reviewed for accuracy by ZAK AUSTIN LORING.   Boone Morel, MD, MHS Assistant Professor [facility]

## 2024-05-14 ENCOUNTER — Emergency Department (HOSPITAL_COMMUNITY)

## 2024-05-14 ENCOUNTER — Inpatient Hospital Stay (HOSPITAL_COMMUNITY)
Admission: EM | Admit: 2024-05-14 | Discharge: 2024-05-20 | DRG: 884 | Disposition: A | Discharge status: Skilled Nursing Facility

## 2024-05-14 ENCOUNTER — Encounter (HOSPITAL_COMMUNITY): Payer: Self-pay

## 2024-05-14 DIAGNOSIS — Z833 Family history of diabetes mellitus: Secondary | ICD-10-CM

## 2024-05-14 DIAGNOSIS — Z7982 Long term (current) use of aspirin: Secondary | ICD-10-CM

## 2024-05-14 DIAGNOSIS — E162 Hypoglycemia, unspecified: Secondary | ICD-10-CM

## 2024-05-14 DIAGNOSIS — D696 Thrombocytopenia, unspecified: Secondary | ICD-10-CM | POA: Diagnosis present

## 2024-05-14 DIAGNOSIS — Z6824 Body mass index (BMI) 24.0-24.9, adult: Secondary | ICD-10-CM

## 2024-05-14 DIAGNOSIS — J440 Chronic obstructive pulmonary disease with acute lower respiratory infection: Secondary | ICD-10-CM | POA: Diagnosis present

## 2024-05-14 DIAGNOSIS — I495 Sick sinus syndrome: Secondary | ICD-10-CM | POA: Diagnosis present

## 2024-05-14 DIAGNOSIS — I4891 Unspecified atrial fibrillation: Secondary | ICD-10-CM | POA: Insufficient documentation

## 2024-05-14 DIAGNOSIS — I10 Essential (primary) hypertension: Secondary | ICD-10-CM | POA: Diagnosis present

## 2024-05-14 DIAGNOSIS — R4182 Altered mental status, unspecified: Secondary | ICD-10-CM | POA: Diagnosis present

## 2024-05-14 DIAGNOSIS — E669 Obesity, unspecified: Secondary | ICD-10-CM | POA: Diagnosis present

## 2024-05-14 DIAGNOSIS — E875 Hyperkalemia: Secondary | ICD-10-CM | POA: Diagnosis present

## 2024-05-14 DIAGNOSIS — G8929 Other chronic pain: Secondary | ICD-10-CM | POA: Diagnosis present

## 2024-05-14 DIAGNOSIS — E1129 Type 2 diabetes mellitus with other diabetic kidney complication: Secondary | ICD-10-CM | POA: Diagnosis present

## 2024-05-14 DIAGNOSIS — Z950 Presence of cardiac pacemaker: Secondary | ICD-10-CM

## 2024-05-14 DIAGNOSIS — E11649 Type 2 diabetes mellitus with hypoglycemia without coma: Secondary | ICD-10-CM | POA: Diagnosis present

## 2024-05-14 DIAGNOSIS — Z79899 Other long term (current) drug therapy: Secondary | ICD-10-CM

## 2024-05-14 DIAGNOSIS — I132 Hypertensive heart and chronic kidney disease with heart failure and with stage 5 chronic kidney disease, or end stage renal disease: Secondary | ICD-10-CM | POA: Diagnosis present

## 2024-05-14 DIAGNOSIS — Z794 Long term (current) use of insulin: Secondary | ICD-10-CM

## 2024-05-14 DIAGNOSIS — K5909 Other constipation: Secondary | ICD-10-CM | POA: Diagnosis present

## 2024-05-14 DIAGNOSIS — I4819 Other persistent atrial fibrillation: Secondary | ICD-10-CM | POA: Diagnosis present

## 2024-05-14 DIAGNOSIS — E1122 Type 2 diabetes mellitus with diabetic chronic kidney disease: Secondary | ICD-10-CM | POA: Diagnosis present

## 2024-05-14 DIAGNOSIS — R601 Generalized edema: Secondary | ICD-10-CM

## 2024-05-14 DIAGNOSIS — J189 Pneumonia, unspecified organism: Secondary | ICD-10-CM | POA: Diagnosis present

## 2024-05-14 DIAGNOSIS — D631 Anemia in chronic kidney disease: Secondary | ICD-10-CM | POA: Diagnosis present

## 2024-05-14 DIAGNOSIS — E785 Hyperlipidemia, unspecified: Secondary | ICD-10-CM | POA: Diagnosis present

## 2024-05-14 DIAGNOSIS — N186 End stage renal disease: Secondary | ICD-10-CM | POA: Diagnosis present

## 2024-05-14 DIAGNOSIS — G4733 Obstructive sleep apnea (adult) (pediatric): Secondary | ICD-10-CM | POA: Diagnosis present

## 2024-05-14 DIAGNOSIS — Z8673 Personal history of transient ischemic attack (TIA), and cerebral infarction without residual deficits: Secondary | ICD-10-CM

## 2024-05-14 DIAGNOSIS — Z823 Family history of stroke: Secondary | ICD-10-CM

## 2024-05-14 DIAGNOSIS — Z993 Dependence on wheelchair: Secondary | ICD-10-CM

## 2024-05-14 DIAGNOSIS — Z7902 Long term (current) use of antithrombotics/antiplatelets: Secondary | ICD-10-CM

## 2024-05-14 DIAGNOSIS — Z1152 Encounter for screening for COVID-19: Secondary | ICD-10-CM

## 2024-05-14 DIAGNOSIS — Z91030 Bee allergy status: Secondary | ICD-10-CM

## 2024-05-14 DIAGNOSIS — Z87891 Personal history of nicotine dependence: Secondary | ICD-10-CM

## 2024-05-14 DIAGNOSIS — F0150 Vascular dementia without behavioral disturbance: Secondary | ICD-10-CM | POA: Diagnosis not present

## 2024-05-14 DIAGNOSIS — E11319 Type 2 diabetes mellitus with unspecified diabetic retinopathy without macular edema: Secondary | ICD-10-CM | POA: Diagnosis present

## 2024-05-14 DIAGNOSIS — Z91158 Patient's noncompliance with renal dialysis for other reason: Secondary | ICD-10-CM

## 2024-05-14 DIAGNOSIS — G9340 Encephalopathy, unspecified: Principal | ICD-10-CM

## 2024-05-14 DIAGNOSIS — Z888 Allergy status to other drugs, medicaments and biological substances status: Secondary | ICD-10-CM

## 2024-05-14 DIAGNOSIS — I5032 Chronic diastolic (congestive) heart failure: Secondary | ICD-10-CM | POA: Diagnosis present

## 2024-05-14 DIAGNOSIS — Z8249 Family history of ischemic heart disease and other diseases of the circulatory system: Secondary | ICD-10-CM

## 2024-05-14 DIAGNOSIS — I5020 Unspecified systolic (congestive) heart failure: Secondary | ICD-10-CM | POA: Insufficient documentation

## 2024-05-14 DIAGNOSIS — Z8419 Family history of other disorders of kidney and ureter: Secondary | ICD-10-CM

## 2024-05-14 DIAGNOSIS — G928 Other toxic encephalopathy: Principal | ICD-10-CM | POA: Diagnosis present

## 2024-05-14 DIAGNOSIS — N400 Enlarged prostate without lower urinary tract symptoms: Secondary | ICD-10-CM | POA: Diagnosis present

## 2024-05-14 DIAGNOSIS — R569 Unspecified convulsions: Secondary | ICD-10-CM | POA: Diagnosis present

## 2024-05-14 DIAGNOSIS — Z992 Dependence on renal dialysis: Secondary | ICD-10-CM

## 2024-05-14 LAB — COMPREHENSIVE METABOLIC PANEL WITH GFR
ALT: 11 U/L (ref 0–44)
AST: 20 U/L (ref 15–41)
Albumin: 3.3 g/dL — ABNORMAL LOW (ref 3.5–5.0)
Alkaline Phosphatase: 65 U/L (ref 38–126)
Anion gap: 17 — ABNORMAL HIGH (ref 5–15)
BUN: 69 mg/dL — ABNORMAL HIGH (ref 8–23)
CO2: 27 mmol/L (ref 22–32)
Calcium: 9.2 mg/dL (ref 8.9–10.3)
Chloride: 96 mmol/L — ABNORMAL LOW (ref 98–111)
Creatinine, Ser: 11.28 mg/dL — ABNORMAL HIGH (ref 0.61–1.24)
GFR, Estimated: 4 mL/min — ABNORMAL LOW (ref >60)
Glucose, Bld: 63 mg/dL — ABNORMAL LOW (ref 70–99)
Potassium: 5.2 mmol/L — ABNORMAL HIGH (ref 3.5–5.1)
Sodium: 140 mmol/L (ref 135–145)
Total Bilirubin: 1 mg/dL (ref 0.0–1.2)
Total Protein: 7 g/dL (ref 6.5–8.1)

## 2024-05-14 LAB — TSH: TSH: 4.439 u[IU]/mL (ref 0.350–4.500)

## 2024-05-14 LAB — RESPIRATORY PANEL BY PCR

## 2024-05-14 LAB — RESP PANEL BY RT-PCR (RSV, FLU A&B, COVID)  RVPGX2
Influenza A by PCR: NEGATIVE
Influenza B by PCR: NEGATIVE
Resp Syncytial Virus by PCR: NEGATIVE
SARS Coronavirus 2 by RT PCR: NEGATIVE

## 2024-05-14 LAB — CBC
HCT: 31.1 % — ABNORMAL LOW (ref 39.0–52.0)
Hemoglobin: 10.1 g/dL — ABNORMAL LOW (ref 13.0–17.0)
MCH: 30.9 pg (ref 26.0–34.0)
MCHC: 32.5 g/dL (ref 30.0–36.0)
MCV: 95.1 fL (ref 80.0–100.0)
Platelets: 88 K/uL — ABNORMAL LOW (ref 150–400)
RBC: 3.27 MIL/uL — ABNORMAL LOW (ref 4.22–5.81)
RDW: 13.5 % (ref 11.5–15.5)
WBC: 4.3 K/uL (ref 4.0–10.5)
nRBC: 0 % (ref 0.0–0.2)

## 2024-05-14 LAB — PHOSPHORUS: Phosphorus: 6.1 mg/dL — ABNORMAL HIGH (ref 2.5–4.6)

## 2024-05-14 LAB — HEMOGLOBIN A1C
Hgb A1c MFr Bld: 5.2 % (ref 4.8–5.6)
Mean Plasma Glucose: 102.54 mg/dL

## 2024-05-14 LAB — HEPATITIS B SURFACE ANTIGEN: Hepatitis B Surface Ag: NONREACTIVE

## 2024-05-14 LAB — CBG MONITORING, ED
Glucose-Capillary: 54 mg/dL — ABNORMAL LOW (ref 70–99)
Glucose-Capillary: 57 mg/dL — ABNORMAL LOW (ref 70–99)
Glucose-Capillary: 129 mg/dL — ABNORMAL HIGH (ref 70–99)
Glucose-Capillary: 78 mg/dL (ref 70–99)

## 2024-05-14 LAB — MAGNESIUM: Magnesium: 2.5 mg/dL — ABNORMAL HIGH (ref 1.7–2.4)

## 2024-05-14 MED ORDER — MEMANTINE HCL 10 MG PO TABS
10.0000 mg | ORAL_TABLET | Freq: Two times a day (BID) | ORAL | Status: DC
  Administered 2024-05-14 – 2024-05-16 (×4): 10 mg via ORAL
  Filled 2024-05-14 (×4): qty 1

## 2024-05-14 MED ORDER — AMIODARONE HCL 200 MG PO TABS
200.0000 mg | ORAL_TABLET | Freq: Every day | ORAL | Status: DC
  Administered 2024-05-14 – 2024-05-20 (×7): 200 mg via ORAL
  Filled 2024-05-14 (×7): qty 1

## 2024-05-14 MED ORDER — LOSARTAN POTASSIUM-HCTZ 100-12.5 MG PO TABS: 1.0000 | ORAL_TABLET | Freq: Every day | ORAL | Status: DC

## 2024-05-14 MED ORDER — SODIUM CHLORIDE 0.9 % IV BOLUS
500.0000 mL | Freq: Once | INTRAVENOUS | Status: AC
  Administered 2024-05-14: 500 mL via INTRAVENOUS

## 2024-05-14 MED ORDER — ACETAMINOPHEN 650 MG RE SUPP: 650.0000 mg | Freq: Four times a day (QID) | RECTAL | Status: DC | PRN

## 2024-05-14 MED ORDER — ACETAMINOPHEN 325 MG PO TABS
650.0000 mg | ORAL_TABLET | Freq: Four times a day (QID) | ORAL | Status: DC | PRN
  Administered 2024-05-14: 650 mg via ORAL
  Filled 2024-05-14: qty 2

## 2024-05-14 MED ORDER — ATROPINE SULFATE 1 % OP SOLN: 1.0000 [drp] | Freq: Two times a day (BID) | OPHTHALMIC | Status: DC | PRN

## 2024-05-14 MED ORDER — SPIRONOLACTONE 25 MG PO TABS
50.0000 mg | ORAL_TABLET | Freq: Every day | ORAL | Status: DC
  Administered 2024-05-14 – 2024-05-20 (×7): 50 mg via ORAL
  Filled 2024-05-14: qty 2
  Filled 2024-05-14: qty 4
  Filled 2024-05-14 (×5): qty 2

## 2024-05-14 MED ORDER — ACETAMINOPHEN 650 MG RE SUPP
650.0000 mg | Freq: Four times a day (QID) | RECTAL | Status: DC
  Filled 2024-05-14 (×3): qty 1

## 2024-05-14 MED ORDER — FINASTERIDE 5 MG PO TABS
5.0000 mg | ORAL_TABLET | Freq: Every day | ORAL | Status: DC
  Administered 2024-05-14: 5 mg via ORAL
  Filled 2024-05-14: qty 1

## 2024-05-14 MED ORDER — ASPIRIN 81 MG PO TBEC
81.0000 mg | DELAYED_RELEASE_TABLET | Freq: Every day | ORAL | Status: DC
  Administered 2024-05-14 – 2024-05-19 (×6): 81 mg via ORAL
  Filled 2024-05-14 (×6): qty 1

## 2024-05-14 MED ORDER — MELATONIN 3 MG PO TABS
3.0000 mg | ORAL_TABLET | Freq: Every day | ORAL | Status: DC
  Administered 2024-05-14 – 2024-05-19 (×6): 3 mg via ORAL
  Filled 2024-05-14 (×6): qty 1

## 2024-05-14 MED ORDER — ACETAMINOPHEN 500 MG PO TABS
1000.0000 mg | ORAL_TABLET | Freq: Four times a day (QID) | ORAL | Status: DC
  Administered 2024-05-14 – 2024-05-20 (×18): 1000 mg via ORAL
  Filled 2024-05-14 (×20): qty 2

## 2024-05-14 MED ORDER — HYDROCHLOROTHIAZIDE 12.5 MG PO TABS
12.5000 mg | ORAL_TABLET | Freq: Every day | ORAL | Status: DC
  Administered 2024-05-14: 12.5 mg via ORAL
  Filled 2024-05-14: qty 1

## 2024-05-14 MED ORDER — CHLORHEXIDINE GLUCONATE CLOTH 2 % EX PADS
6.0000 | MEDICATED_PAD | Freq: Every day | CUTANEOUS | Status: DC
  Administered 2024-05-15 – 2024-05-20 (×6): 6 via TOPICAL

## 2024-05-14 MED ORDER — INSULIN ASPART 100 UNIT/ML IJ SOLN: 0.0000 [IU] | Freq: Three times a day (TID) | INTRAMUSCULAR | Status: DC

## 2024-05-14 MED ORDER — LOSARTAN POTASSIUM 50 MG PO TABS
100.0000 mg | ORAL_TABLET | Freq: Every day | ORAL | Status: DC
  Administered 2024-05-14 – 2024-05-19 (×6): 100 mg via ORAL
  Filled 2024-05-14 (×6): qty 2

## 2024-05-14 MED ORDER — POLYETHYLENE GLYCOL 3350 17 G PO PACK
17.0000 g | PACK | Freq: Every day | ORAL | Status: DC | PRN
Start: 2024-05-14 — End: 2024-05-20

## 2024-05-14 MED ORDER — HEPARIN SODIUM (PORCINE) 5000 UNIT/ML IJ SOLN
5000.0000 [IU] | Freq: Three times a day (TID) | INTRAMUSCULAR | Status: DC
  Administered 2024-05-14 – 2024-05-20 (×17): 5000 [IU] via SUBCUTANEOUS
  Filled 2024-05-14 (×19): qty 1

## 2024-05-14 MED ORDER — CLOPIDOGREL BISULFATE 75 MG PO TABS
75.0000 mg | ORAL_TABLET | Freq: Every day | ORAL | Status: DC
  Administered 2024-05-14 – 2024-05-20 (×7): 75 mg via ORAL
  Filled 2024-05-14 (×7): qty 1

## 2024-05-14 MED ORDER — CALCIUM ACETATE (PHOS BINDER) 667 MG PO CAPS
667.0000 mg | ORAL_CAPSULE | Freq: Three times a day (TID) | ORAL | Status: DC
  Administered 2024-05-14 – 2024-05-20 (×16): 667 mg via ORAL
  Filled 2024-05-14 (×16): qty 1

## 2024-05-14 MED ORDER — LIDOCAINE 5 % EX PTCH
2.0000 | MEDICATED_PATCH | CUTANEOUS | Status: DC
  Administered 2024-05-14 – 2024-05-19 (×5): 2 via TRANSDERMAL
  Filled 2024-05-14 (×7): qty 2

## 2024-05-14 MED ORDER — DEXTROSE 50 % IV SOLN
1.0000 | Freq: Once | INTRAVENOUS | Status: AC
  Administered 2024-05-14: 50 mL via INTRAVENOUS
  Filled 2024-05-14: qty 50

## 2024-05-14 MED ORDER — DIVALPROEX SODIUM 125 MG PO DR TAB
125.0000 mg | DELAYED_RELEASE_TABLET | Freq: Every day | ORAL | Status: DC
  Administered 2024-05-14 – 2024-05-20 (×7): 125 mg via ORAL
  Filled 2024-05-14 (×9): qty 1

## 2024-05-14 NOTE — ED Notes (Signed)
 Patient transported to CT

## 2024-05-14 NOTE — ED Provider Triage Note (Signed)
 Emergency Medicine Provider Triage Evaluation Note  WHYATT KLINGER , a 73 y.o. male  was evaluated in triage.  Pt complains of mental status change..  Review of Systems  Positive: Confusion Negative: Fevers  Physical Exam  BP 107/70 (BP Location: Right Arm)   Pulse 69   Temp 98.3 F (36.8 C)   Resp 18   SpO2 100%  Right pupil constricted.  Will answer questions.  Dialysis access left upper arm.  Medical Decision Making  Medically screening exam initiated at 8:56 AM.  Appropriate orders placed.  LORENZA WINKLEMAN was informed that the remainder of the evaluation will be completed by another provider, this initial triage assessment does not replace that evaluation, and the importance of remaining in the ED until their evaluation is complete.  Patient reported mental status change.  Reportedly recent start of tramadol .  Does have constricted pupil on the right but is also dialysis.  States he is Monday Wednesday Friday.  Has not been dialyzed yet today.  Electrolyte abnormality considered.  EKG is paced.  Will get blood work.  Will monitor for now since unknown potassium.   Patsey Lot, MD 05/14/24 [phone]

## 2024-05-14 NOTE — Hospital Course (Addendum)
  Hospital Course  Acute Encephalopathy  Patient with vascular dementia presented with 2-3 days of worsening confusion and lethargy. CT head was negative for acute pathology, and there were no infectious signs or leukocytosis. EEG showed no seizures. Polypharmacy was suspected; trazodone and tramadol  were discontinued, and memantine  was reduced to 5 mg BID based on renal function, though mentation did not improve. Encephalopathy was ultimately attributed to progression of underlying vascular dementia.  Pneumonia: Mild cough with chest X-ray showing a slight interval increase in consolidation. Treated with ceftriaxone  and azithromycin  during hospitalization; transitioned to complete 2 additional days of cefpodoxime .  ESRD on Hemodialysis (MWF): He had missed his prior HD session and presented with elevated BUN/Cr, mild hyperkalemia, and uremia. He received urgent dialysis on admission and resumed MWF treatments. Mental status did not improve with clearance of uremia.  Hypoglycemia: Intermittent episodes treated with IV D50; blood glucose remained stable at discharge.  Hypertension: Losartan /HCTZ was discontinued, and losartan  was increased to 100 mg daily. Spironolactone  50 mg daily continued.  Atrial Fibrillation / Sick Sinus Syndrome s/p Pacemaker: Hemodynamically stable. Continues amiodarone  200 mg for rhythm control. Not on rate control since carvedilol  was discontinued in 10/2023.  Polypharmacy: Medications contributing to sedation or not beneficial in ESRD were discontinued, including losartan /HCTZ, finasteride , multivitamins, vitamin C, folic acid , red yeast rice, tramadol , and trazodone. Memantine  was reduced to 5 mg BID. Wife updated on all changes.

## 2024-05-14 NOTE — Consult Note (Addendum)
 Renal Service Consult Note [facility] [name], MD  Patient: Gregory Hood Date: 05/14/2024 Requesting Physician: Dr. Garrick  Reason for Consult: ESRD pt w/ altered mental status HPI: The patient is a 73 y.o. year-old w/ PMH as below who presented to ED sent from SNF for gen'd weakness, lethargy and AMS. Started on tramadol  3 days ago. In Ed BP was 119/82, HR 75, RR 15, temp 98. 96% sats on RA. K+ 5.2, BUN 69, creat 11.28. Alb 3.3, HB 10.1.  He rec'd 500 cc NS bolus and 1 amp D50 for BS in the 60s. Pt to be admitted. We are asked to see for dialysis.    Pt seen in ED. Pt is minimally verbal. Family member at the bedside notes that SNF said he was febrile recently. He does have dementia w/ poor short-term memory. Hx of prior CVA, esrd on HD MWF.    ROS - poor historian   Past Medical History  Past Medical History:  Diagnosis Date   COPD (chronic obstructive pulmonary disease) (HCC)    Diabetes mellitus without complication (HCC)    Diabetic retinopathy (HCC)    ESRD on hemodialysis (HCC)    Hyperlipidemia    Hypertension    Obesity    Orthostatic hypotension    OSA on CPAP    Retinal detachment    Seizures (HCC)    Stroke (HCC)    TIA (transient ischemic attack)    Umbilical hernia    Past Surgical History  Past Surgical History:  Procedure Laterality Date   BASCILIC VEIN TRANSPOSITION Left 03/06/2015   Procedure: LET BASCILIC VEIN TRANSPOSITION;  Surgeon: Lonni GORMAN Blade, MD;  Location: [facility] OR;  Service: Vascular;  Laterality: Left;   CIRCUMCISION     EYE SURGERY Bilateral    had surgery several different times.   HAMMER TOE SURGERY Left 30 years ago   small toe left toe   HERNIA REPAIR     IR AV DIALY SHUNT INTRO NEEDLE/INTRACATH INITIAL W/PTA/IMG LEFT  07/20/2018   SEPTOPLASTY  20 years ago   Family History  Family History  Problem Relation Age of Onset   Hypertension Mother    Diabetes type II Mother    Transient ischemic attack  Mother    Kidney disease Mother    Diabetes Mother    Varicose Veins Mother    Hypertension Father    Diabetes type II Father    Diabetes Father    Diabetes type II Sister    Hypertension Sister    Diabetes Sister    Learning disabilities Maternal Uncle    Social History  reports that he quit smoking about 50 years ago. His smoking use included cigarettes. He has never used smokeless tobacco. He reports that he does not drink alcohol and does not use drugs. Allergies  Allergies  Allergen Reactions   Bee Venom Swelling   Lexiscan  [Regadenoson ] Other (See Comments)    Seizure like-activity after lexiscan  given.   Statins     Other reaction(s): Other (See Comments) Myalgias and CK increase.  Tried both atorvastatin  and rosuvastatin    Ace Inhibitors Cough   Home medications Prior to Admission medications   Medication Sig Start Date End Date Taking? Authorizing Provider  acetaminophen  (TYLENOL ) 500 MG tablet Take 500 mg by mouth daily.    [provider]  ascorbic acid (VITAMIN C) 500 MG tablet Take 1 tablet by mouth daily. 07/22/23   [provider]  aspirin  81 MG  tablet Take 1 tablet (81 mg total) by mouth at bedtime. 12/24/20   Hongalgi, Anand D, MD  atropine  1 % ophthalmic solution Place 1 drop into both eyes. 10/25/23   [provider]  bisacodyl  (DULCOLAX) 10 MG suppository Place 10 mg rectally as needed for moderate constipation.    [provider]  calcium  acetate (PHOSLO ) 667 MG capsule Take 667 mg by mouth 3 (three) times daily. 08/29/20   [provider]  carvedilol  (COREG ) 3.125 MG tablet Take 1 tablet (3.125 mg total) by mouth 2 (two) times daily with a meal. Take after dialysis on dialysis days 11/13/23   Kathrin Mignon DASEN, MD  Cholecalciferol  50 MCG (2000 UT) TABS Take 0.5 tablets (1,000 Units total) by mouth daily. 12/24/20   Hongalgi, Anand D, MD  clopidogrel  (PLAVIX ) 75 MG tablet Take 75 mg by mouth daily. 03/10/18   [provider]  cyanocobalamin  (,VITAMIN B-12,) 1000 MCG/ML injection Inject 1,000 mcg into the muscle every 30 (thirty) days. On the 26th    [provider]  Dextrose , Diabetic Use, (GLUCOSE) 77.4 % GEL Take 1 Dose by mouth as needed (blood glucose less than 70).    [provider]  divalproex (DEPAKOTE) 125 MG DR tablet Take 125 mg by mouth.    [provider]  EPINEPHrine  0.3 mg/0.3 mL IJ SOAJ injection Inject 0.3 mg into the muscle once as needed for anaphylaxis. 10/12/20   [provider]  ezetimibe  (ZETIA ) 10 MG tablet Take 10 mg by mouth every evening. 04/22/19   [provider]  Ferrous Sulfate (IRON) 325 (65 Fe) MG TABS Take 1 tablet by mouth daily. 07/22/23   [provider]  finasteride  (PROSCAR ) 5 MG tablet Take 5 mg by mouth daily. 03/26/19   [provider]  folic acid  (FOLVITE ) 1 MG tablet Take 2 tablets (2 mg total) by mouth daily. 04/03/14   Nishan, Peter C, MD  furosemide  (LASIX ) 40 MG tablet Take 1 tablet by mouth daily. 05/04/23   [provider]  Glucagon, rDNA, (GLUCAGON EMERGENCY IJ) Inject 1 mg into the muscle as needed (blood sugar less than 70).    [provider]  Insulin  Aspart FlexPen 100 UNIT/ML SOPN Inject 2-10 Units into the skin See admin instructions. Bid per sliding scale Less than 150= 0 units, 151-200= 2 units, 201-250=4 units, 251-300= 6 units, 301-350= 8 units, 351-400= 10 units, if less than 70 or over 401 call md 12/16/20   [provider]  losartan (COZAAR) 50 MG tablet Take 50 mg by mouth daily. 08/27/22   [provider]  melatonin 3 MG TABS tablet Take 3 mg by mouth at bedtime. 05/04/23   [provider]  memantine  (NAMENDA ) 10 MG tablet Take 10 mg by mouth 2 (two) times daily.    [provider]  multivitamin (RENA-VIT) TABS tablet Take 1 tablet by mouth daily.    [provider]  niacin  100 MG tablet Take 200 mg by mouth every evening.     [provider]  ondansetron  (ZOFRAN -ODT) 4 MG disintegrating tablet Take 4 mg by mouth every 4 (four) hours as needed for nausea or vomiting.    [provider]  OVER THE COUNTER MEDICATION Take 1 Dose by mouth every evening. Yogurt for weight loss    [provider]  polyethylene glycol (MIRALAX  / GLYCOLAX ) packet Take 17 g by mouth 2 (two) times daily. 03/30/18   Hongalgi, Anand D, MD  Red Yeast Rice 600  MG CAPS Take 1,200 mg by mouth at bedtime.    [provider]  senna-docusate (SENOKOT-S) 8.6-50 MG tablet Take 2 tablets by mouth daily. 07/19/18   Christobal Guadalajara, MD  Sodium Phosphates (FLEET ENEMA RE) Place 1 Dose rectally as needed (constipation).    [provider]  terbinafine (LAMISIL) 250 MG tablet Take 250 mg by mouth daily. 10/12/23   [provider]  traMADol  (ULTRAM ) 50 MG tablet Take 50 mg by mouth every 6 (six) hours as needed for moderate pain (pain score 4-6). 07/06/23   [provider]  traZODone (DESYREL) 150 MG tablet Take 150 mg by mouth at bedtime. 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 Per SNF was febrile. Temp here 98, wbc 4K.  ESRD: on HD MWF. Got minimal HD on Friday. Plan HD today/tonight. AVF loud bruit, no signs of clogging.  HTN: bp's are stable, resume SNF meds if needed.  Volume: euvolemic on exam. CXR no edema. UF 1.5- 2 L as tolerated. 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  Dementia SNF dependent: per family member     Myer Fret  MD [facility] 05/14/2024, 2:59 PM  Recent Labs  Lab 05/14/24 0930  HGB 10.1*  ALBUMIN 3.3*  CALCIUM  9.2  CREATININE 11.28*  K 5.2*   Inpatient medications:  [START ON 05/15/2024] Chlorhexidine  Gluconate Cloth  6 each Topical Q0600

## 2024-05-14 NOTE — ED Notes (Signed)
 Pt does not make urine much anymore per family member. Bladder scan showed only 20 mL.

## 2024-05-14 NOTE — H&P (Cosign Needed Addendum)
 Date: 05/14/2024               Patient Name:  Gregory Hood MRN: [id number]  DOB: July 23, 1950 Age / Sex: 73 y.o., male   PCP: Valma Carwin, MD         Medical Service: Internal Medicine Teaching Service         Attending Physician: Dr. Mliss Foot      First Contact: Armando Rossetti, MD}    Second Contact: Dr. Missy Sandhoff, MD         Pager Information: First Contact Pager: [phone]   Second Contact Pager: [phone]   SUBJECTIVE   Chief Complaint: Acute Encephalopathy   History of Present Illness: Gregory Hood is a 73 y.o. male with history of ESRD on HD (MWF), multiple prior strokes, vascular dementia, HFpEF, atrial fibrillation on rhythm control with pacemaker, chronic pain, and functional dependence at baseline, who presents from his rehabilitation facility for acute change in mental status.  Per his wife, patient has been more lethargic, sleeping excessively for the past 3 days, and today was "talking out of his head," calling out random names, and unable to hold a conversation. She also reports shivering at the facility and a low-grade fever earlier today. His interaction is notably worse than his baseline, where he can usually respond briefly, follow simple conversation, and express basic needs.  She additionally reports a new dry cough that began yesterday but no shortness of breath or chest pain. Bowel movements are normal; no diarrhea. Urine output has been minimal as expected for ESRD.  The facility recently increased his tramadol  dose for chronic buttock/back pain, and wife believes his confusion worsened after this change.  Collateral information from SNF staff [facility]) differs: according to them, patient's baseline is alert, oriented, able to sit up and hold conversation, and they denied cough, SOB, fever, or new symptoms. They note chronic constipation treated with stool softeners, mechanical soft diet with 2-L fluid restriction, and medications given with  applesauce. They also stated that on Friday he was transported to dialysis, but there were concerns for possible fistula infiltration, and the wife declined dialysis at that visit..   ED Course: Labs significant for: K 5.2 BUN 69 / Cr 11.28 (GFR 4) Glucose 63 and  fingerstick 54, treated, improved to 129 Platelets 88 Hgb 10.1 Albumin 3.3 No leukocytosis  Imaging: CT Head: no acute abnormality; chronic microvascular ischemia; old infarcts CXR: possible small L pleural effusion vs LLL pneumonia; basilar opacities; cardiomegaly  Received: D50 IV for hypoglycemia 500 mL NS Oral carbohydrates  Consulted: Nephrology: plans for dialysis today; possible extra session tomorrow  Meds:  Patient reported:  Acetaminophen  500 mg daily Aspirin  81 mg nightly Atropine  ophthalmic solution 1 drop nightly both eyes Dulcolax 10 mg suppository as needed PhosLo  667 mg 3 times daily before meals Plavix  and 5 mg daily Artificial tears solution 4 times daily Depakote 125 mg daily Epinephrine  pen Ferrous sulfate 325 mg daily Finasteride  5 mg daily daily Folic acid  1 2 mg nightly Lidocaine -prilocaine  cream Losartan-HCTZ 100-12.5 mg daily Melatonin 9 mg nightly Memantine  10 mg daily MiraLAX  17 mg daily Prednisolone  acetate 1% ophthalmic solution Red yeast rice 2 tablets nightly Aldactone 50 mg daily Tramadol  50 mg every 6 hours as needed Trazodone 50 mg nightly Eliquis 5 mg twice daily Vitamin C 500 mg daily Rena-Vite 0.8 mg daily Vitamin D3 1000 units daily Cinacalcet 30 mg daily Vitamin B12 1000 mg monthly NovoLog  FlexPen Multivitamin 1 tablet daily  Niacin  200 mg every night Zofran  4 mg every 6 hours Senokot 8.6 mg 2 tablets nightly   Current Meds  Medication Sig   acetaminophen  (TYLENOL ) 500 MG tablet Take 500 mg by mouth daily.   amiodarone (PACERONE) 400 MG tablet Take 200 mg by mouth daily.   aspirin  81 MG tablet Take 1 tablet (81 mg total) by mouth at bedtime.   calcium   acetate (PHOSLO ) 667 MG capsule Take 667 mg by mouth 3 (three) times daily.   clopidogrel  (PLAVIX ) 75 MG tablet Take 75 mg by mouth daily.   divalproex (DEPAKOTE) 125 MG DR tablet Take 125 mg by mouth daily at 12 noon.   Ferrous Sulfate (IRON) 325 (65 Fe) MG TABS Take 1 tablet by mouth daily.   finasteride  (PROSCAR ) 5 MG tablet Take 5 mg by mouth daily.   folic acid  (FOLVITE ) 1 MG tablet Take 2 tablets (2 mg total) by mouth daily. (Patient taking differently: Take 1 mg by mouth daily.)   losartan-hydrochlorothiazide (HYZAAR) 100-12.5 MG tablet Take 1 tablet by mouth at bedtime.   melatonin 3 MG TABS tablet Take 3 mg by mouth at bedtime.   memantine  (NAMENDA ) 10 MG tablet Take 10 mg by mouth 2 (two) times daily.   polyethylene glycol (MIRALAX  / GLYCOLAX ) packet Take 17 g by mouth 2 (two) times daily. (Patient taking differently: Take 17 g by mouth daily.)   Red Yeast Rice 600 MG CAPS Take 1,200 mg by mouth at bedtime.   spironolactone (ALDACTONE) 50 MG tablet Take 1 tablet by mouth daily.   traMADol  (ULTRAM ) 50 MG tablet Take 50 mg by mouth every 6 (six) hours as needed for moderate pain (pain score 4-6).   traZODone (DESYREL) 150 MG tablet Take 150 mg by mouth at bedtime.    Past Medical History ESRD on hemodialysis (MWF) Multiple prior CVA (?8), vascular dementia HFpEF (EF 45-55%), LVH, grade I-II diastolic dysfunction Atrial fibrillation / tachyarrhythmias s/p dual-chamber pacemaker COPD Type II diabetes mellitus Orthostatic hypotension Seizure like activity (Per EMS pt had near syncope/  seizure episode after church. Pt wife say that he went unconscious and started to have jerking movements. Pt sts he remembers everything that happen. Pt denies any seizure hx ) OSA HLD BPH Obesity  Past Surgical History Past Surgical History:  Procedure Laterality Date   BASCILIC VEIN TRANSPOSITION Left 03/06/2015   Procedure: LET BASCILIC VEIN TRANSPOSITION;  Surgeon: Lonni GORMAN Blade, MD;   Location: [facility] OR;  Service: Vascular;  Laterality: Left;   CIRCUMCISION     EYE SURGERY Bilateral    had surgery several different times.   HAMMER TOE SURGERY Left 30 years ago   small toe left toe   HERNIA REPAIR     IR AV DIALY SHUNT INTRO NEEDLE/INTRACATH INITIAL W/PTA/IMG LEFT  07/20/2018   SEPTOPLASTY  20 years ago    Social:  Lives With: SNF, [facility] Occupation: Retired education officer, environmental Support: Excellent support from wife and family Level of Function: Wheelchair-bound, dependent in most ADLs/IADLs PCP: Valma Carwin, MD Substances: Tobacco: Quit 50 years ago Alcohol: None Recreational drugs: None  Family History:  Family History  Problem Relation Age of Onset   Hypertension Mother    Diabetes type II Mother    Transient ischemic attack Mother    Kidney disease Mother    Diabetes Mother    Varicose Veins Mother    Hypertension Father    Diabetes type II Father    Diabetes Father  Diabetes type II Sister    Hypertension Sister    Diabetes Sister    Learning disabilities Maternal Uncle      Allergies: Allergies as of 05/14/2024 - Reviewed 05/14/2024  Allergen Reaction Noted   Bee venom Swelling 03/22/2018   Lexiscan  [regadenoson ] Other (See Comments) 06/25/2014   Statins  01/30/2018   Ace inhibitors Cough 02/17/2014    Review of Systems: A complete ROS was negative except as per HPI.   OBJECTIVE:   Physical Exam: Blood pressure 124/70, pulse 69, temperature 97.8 F (36.6 C), temperature source Oral, resp. rate 14, SpO2 100%.  Constitutional: Cachectic, sleeping in bed, in no acute distress. Oriented to self and son. Follows commands poorly. HENT: Normocephalic, atraumatic; mucous membranes moist. Eyes: Left eye blind with opaque pupil; right pupil constricted; conjunctivae non-erythematous. Neck: Supple, no meningismus. Cardiovascular: Regular rate and rhythm; no murmurs, rubs, or gallops. Pulmonary/Chest: Normal work of breathing on  room air; lungs clear to auscultation bilaterally. Abdomen: Soft, non-tender, non-distended; no guarding or rebound. MSK: Poor muscle bulk, especially in lower extremities; chronic generalized weakness; normal tone in upper and lower extremities. Neurological: Somnolent but arousable; oriented to self and son; limited verbal output; moves all extremities spontaneously; no focal deficits appreciated. Skin: Warm and dry; no rashes or lesions. Psych: Flat affect; limited engagement.  Labs: CBC    Component Value Date/Time   WBC 4.3 05/14/2024 0930   RBC 3.27 (L) 05/14/2024 0930   HGB 10.1 (L) 05/14/2024 0930   HCT 31.1 (L) 05/14/2024 0930   HCT 38.2 07/11/2018 2330   PLT 88 (L) 05/14/2024 0930   MCV 95.1 05/14/2024 0930   MCH 30.9 05/14/2024 0930   MCHC 32.5 05/14/2024 0930   RDW 13.5 05/14/2024 0930   LYMPHSABS 0.5 (L) 11/13/2023 0941   MONOABS 0.9 11/13/2023 0941   EOSABS 0.1 11/13/2023 0941   BASOSABS 0.0 11/13/2023 0941     CMP     Component Value Date/Time   NA 140 05/14/2024 0930   K 5.2 (H) 05/14/2024 0930   CL 96 (L) 05/14/2024 0930   CO2 27 05/14/2024 0930   GLUCOSE 63 (L) 05/14/2024 0930   BUN 69 (H) 05/14/2024 0930   CREATININE 11.28 (H) 05/14/2024 0930   CALCIUM  9.2 05/14/2024 0930   PROT 7.0 05/14/2024 0930   ALBUMIN 3.3 (L) 05/14/2024 0930   AST 20 05/14/2024 0930   ALT 11 05/14/2024 0930   ALKPHOS 65 05/14/2024 0930   BILITOT 1.0 05/14/2024 0930   GFRNONAA 4 (L) 05/14/2024 0930   GFRAA 19 (L) 01/19/2019 2111    Imaging: DG Chest 2 View Result Date: 05/14/2024 EXAM: 2 VIEW(S) XRAY OF THE CHEST 05/14/2024 12:59:00 PM COMPARISON: 11/11/2023 CLINICAL HISTORY: weakness FINDINGS: LINES, TUBES AND DEVICES: Dual lead pacer with leads at right atrium and right ventricle. LUNGS AND PLEURA: Left greater than right basilar airspace disease. Possible small left pleural effusion versus artifact due to overlying soft tissues. No pneumothorax. HEART AND MEDIASTINUM:  Moderate cardiomegaly. BONES AND SOFT TISSUES: No acute osseous abnormality. LIMITATIONS/ARTIFACTS: Lateral view degraded by patient arm position and overlying artifact. Frontal AP view mildly degraded by patient body habitus. IMPRESSION: 1. Both views are degraded secondary to positioning and technique as detailed above. 2. Possible small left pleural effusion with left greater than right basilar airspace disease. Although this could all represent atelectasis, cannot exclude left lower lobe pneumonia. 3. Cardiomegaly without congestive heart failure. Electronically signed by: Rockey Kilts MD 05/14/2024 01:55 PM EST RP Workstation: [id number]  CT Head Wo Contrast Result Date: 05/14/2024 EXAM: CT HEAD WITHOUT CONTRAST 05/14/2024 12:43:00 PM TECHNIQUE: CT of the head was performed without the administration of intravenous contrast. Automated exposure control, iterative reconstruction, and/or weight based adjustment of the mA/kV was utilized to reduce the radiation dose to as low as reasonably achievable. COMPARISON: CT Head 06/01/23 CLINICAL HISTORY: Mental status change, unknown cause FINDINGS: BRAIN AND VENTRICLES: No acute hemorrhage. No evidence of acute infarct. No hydrocephalus. No extra-axial collection. No mass effect or midline shift. Cerebral atrophy. Patchy white matter hypodensities, compatible with chronic microvascular ischemic changes. 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 ORBITS: No acute abnormality. SINUSES: No acute abnormality. SOFT TISSUES AND SKULL: No acute soft tissue abnormality. No skull fracture. IMPRESSION: 1. No acute intracranial abnormality. 2. Cerebral atrophy and chronic microvascular ischemic change. Electronically signed by: Gilmore Molt MD 05/14/2024 01:01 PM EST RP Workstation: [id number]     EKG: personally reviewed my interpretation is normal rate:70 and ventricular paced rhythm .   ASSESSMENT & PLAN:   Assessment & Plan by  Problem: Active Problems:   * No active hospital problems. *  CHASON MCIVER is a 73 y.o. male with ESRD on HD, multiple prior strokes with vascular dementia, HFpEF, and AF s/p pacemaker who presented with acute encephalopathy and weakness, admitted for evaluation and management of metabolic encephalopathy on hospital day 0.  # Acute Encephalopathy Subacute decline over 2-3 days per wife; somnolent, minimally verbal. Likely multifactorial: missed dialysis ? uremia, recent increase in tramadol , hypoglycemia in ED, possible early infection. CT head negative; no focal deficits. Plan: - HD today; evaluate for need of second session tomorrow - Hold tramadol  and other sedating meds like trazodone  - Neuro checks q4h - Trend glucose AC/HS - Follow cultures; start antibiotics only if fever/WBC rise or respiratory decline - Monitor mental status after HD  # ESRD on Hemodialysis (MWF) Missed Friday HD due to concern for fistula infiltration and wife declining treatment; now with elevated BUN/Cr and mild hyperkalemia. Plan: - HD today per nephrology - BMP in AM - strict I/O; daily weights - Avoid nephrotoxic meds  # Possible LLL Pneumonia vs Atelectasis CXR with basilar opacities and possible small L pleural effusion; afebrile and no leukocytosis here; SNF denies respiratory symptoms. Plan: - Monitor symptoms and vitals - Consider starting empiric ceftriaxone and azithromycin only if clinical decline - Incentive spirometry  # Hypoglycemia Initial glucose 54; improved after D50 and PO intake. Plan: - AC/HS glucose checks - Ensure adequate PO intake - Review meds for hypoglycemia risk (none significant)  # Atrial Fibrillation s/p Pacemaker V-paced rhythm on EKG; rate controlled. Plan: Continue amiodarone Continue aspirin  + clopidogrel  Telemetry monitoring  # HFpEF Mild volume overload likely from missed HD. Plan: - HD as above - Continue losartan-HCTZ and spironolactone - Monitor  respiratory status, daily weights -Fluid restriction (2 L)  # Multiple Prior CVA / Vascular Dementia Lower neurologic reserve; worsened by metabolic derangements. Plan: - Continue memantine  - Address reversible contributors (uremia, pain meds, hypoglycemia)  # Chronic Pain Tramadol  recently increased at SNF; high risk in ESRD. Plan: - HOLD tramadol  - Scheduled Tylenol  PRN - Avoid opioids unless severe breakthrough  # Constipation At baseline; on stool softener. Plan: Continue stool regimen Monitor BM frequency  # Chronic Anemia of CKD Hgb 10.1, stable. Plan: Continue iron + folate Monitor CBC  # Thrombocytopenia Platelets 88; chronic. Plan: - Monitor CBC - Continue heparin  for DVT ppx unless bleeding  Best practice: Diet:  Normal VTE: Heparin  IVF: None,None Code: Full  Disposition planning: Prior to Admission Living Arrangement: SNF, [place] Anticipated Discharge Location: SNF  Dispo: Admit patient to Observation with expected length of stay less than 2 midnights.  Signed: Nyanna Heideman, MD Internal Medicine Resident  05/14/2024, 3:12 PM  On Call pager: [phone]

## 2024-05-14 NOTE — ED Triage Notes (Signed)
 Pt last 3 days showing increased weakness and lethargy. AOX4 which is baseline. Pt started Tramadol  3 days ago. Left sided fistula, family noticed swelling on it today but unsure how long it has been that way. Dialysis pt.

## 2024-05-14 NOTE — TOC CM/SW Note (Signed)
 TOC consult received for SNF placement. SW to f/u. Dialysis Navigator aware of patient. Patient receives HD on a MWF schedule at [facility].  Merilee Batty, MSN, RN Case Management [phone]

## 2024-05-14 NOTE — ED Notes (Signed)
 Pt had large incontinent bowel movement. Pt provided peri care and full linen change.

## 2024-05-14 NOTE — ED Notes (Signed)
 Pt to dialysis.

## 2024-05-14 NOTE — Progress Notes (Signed)
 Pt receives out-pt HD at [facility], MWF 0930am chair time. Will continue to assist as needed.   Lavanda Zorina Mallin Dialysis Navigator [phone]

## 2024-05-14 NOTE — ED Notes (Signed)
 Attempt at blood culture unsuccessful rn kaitlin aware at

## 2024-05-14 NOTE — ED Provider Notes (Signed)
 [place] EMERGENCY DEPARTMENT AT [facility] Provider Note   CSN: [id number] Arrival date & time: 05/14/24  9165     Patient presents with: Weakness   Gregory Hood is a 73 y.o. male.   HPI Patient presents with his wife. Patient is minimally verbal, he is in a rehabilitation facility due to history of prior stroke, end-stage renal disease.  Wife notes the patient was less interactive than usual this morning, and nursing home staff felt the patient was febrile. The patient was self does answer some questions appropriately, directly. Wife notes that the patient last had dialysis 5 days ago, missed the subsequent session due to some concern about this fistula, and today was his dialysis day. Level 5 caveat secondary to confusion.    Prior to Admission medications   Medication Sig Start Date End Date Taking? Authorizing Provider  acetaminophen  (TYLENOL ) 500 MG tablet Take 500 mg by mouth daily.    [provider]  ascorbic acid (VITAMIN C) 500 MG tablet Take 1 tablet by mouth daily. 07/22/23   [provider]  aspirin  81 MG tablet Take 1 tablet (81 mg total) by mouth at bedtime. 12/24/20   Hongalgi, Anand D, MD  atropine  1 % ophthalmic solution Place 1 drop into both eyes. 10/25/23   [provider]  bisacodyl  (DULCOLAX) 10 MG suppository Place 10 mg rectally as needed for moderate constipation.    [provider]  calcium  acetate (PHOSLO ) 667 MG capsule Take 667 mg by mouth 3 (three) times daily. 08/29/20   [provider]  carvedilol  (COREG ) 3.125 MG tablet Take 1 tablet (3.125 mg total) by mouth 2 (two) times daily with a meal. Take after dialysis on dialysis days 11/13/23   Kathrin Mignon DASEN, MD  Cholecalciferol  50 MCG (2000 UT) TABS Take 0.5 tablets (1,000 Units total) by mouth daily. 12/24/20   Hongalgi, Anand D, MD  clopidogrel  (PLAVIX ) 75 MG tablet Take 75 mg by mouth daily. 03/10/18   [provider]  cyanocobalamin   (,VITAMIN B-12,) 1000 MCG/ML injection Inject 1,000 mcg into the muscle every 30 (thirty) days. On the 26th    [provider]  Dextrose , Diabetic Use, (GLUCOSE) 77.4 % GEL Take 1 Dose by mouth as needed (blood glucose less than 70).    [provider]  divalproex (DEPAKOTE) 125 MG DR tablet Take 125 mg by mouth.    [provider]  EPINEPHrine  0.3 mg/0.3 mL IJ SOAJ injection Inject 0.3 mg into the muscle once as needed for anaphylaxis. 10/12/20   [provider]  ezetimibe  (ZETIA ) 10 MG tablet Take 10 mg by mouth every evening. 04/22/19   [provider]  Ferrous Sulfate (IRON) 325 (65 Fe) MG TABS Take 1 tablet by mouth daily. 07/22/23   [provider]  finasteride  (PROSCAR ) 5 MG tablet Take 5 mg by mouth daily. 03/26/19   [provider]  folic acid  (FOLVITE ) 1 MG tablet Take 2 tablets (2 mg total) by mouth daily. 04/03/14   Delford Maude BROCKS, MD  furosemide  (LASIX ) 40 MG tablet Take 1 tablet by mouth daily. 05/04/23   [provider]  Glucagon, rDNA, (GLUCAGON EMERGENCY IJ) Inject 1 mg into the muscle as needed (blood sugar less than 70).    [provider]  Insulin  Aspart FlexPen 100 UNIT/ML SOPN Inject 2-10 Units into the skin See admin instructions. Bid per sliding scale Less than 150= 0 units, 151-200= 2 units, 201-250=4 units, 251-300= 6 units, 301-350= 8  units, 351-400= 10 units, if less than 70 or over 401 call md 12/16/20   [provider]  losartan (COZAAR) 50 MG tablet Take 50 mg by mouth daily. 08/27/22   [provider]  melatonin 3 MG TABS tablet Take 3 mg by mouth at bedtime. 05/04/23   [provider]  memantine  (NAMENDA ) 10 MG tablet Take 10 mg by mouth 2 (two) times daily.    [provider]  multivitamin (RENA-VIT) TABS tablet Take 1 tablet by mouth daily.    [provider]  niacin  100 MG tablet Take 200 mg by mouth every evening.    [provider]   ondansetron  (ZOFRAN -ODT) 4 MG disintegrating tablet Take 4 mg by mouth every 4 (four) hours as needed for nausea or vomiting.    [provider]  OVER THE COUNTER MEDICATION Take 1 Dose by mouth every evening. Yogurt for weight loss    [provider]  polyethylene glycol (MIRALAX  / GLYCOLAX ) packet Take 17 g by mouth 2 (two) times daily. 03/30/18   Hongalgi, Anand D, MD  Red Yeast Rice 600 MG CAPS Take 1,200 mg by mouth at bedtime.    [provider]  senna-docusate (SENOKOT-S) 8.6-50 MG tablet Take 2 tablets by mouth daily. 07/19/18   Christobal Guadalajara, MD  Sodium Phosphates (FLEET ENEMA RE) Place 1 Dose rectally as needed (constipation).    [provider]  terbinafine (LAMISIL) 250 MG tablet Take 250 mg by mouth daily. 10/12/23   [provider]  traMADol  (ULTRAM ) 50 MG tablet Take 50 mg by mouth every 6 (six) hours as needed for moderate pain (pain score 4-6). 07/06/23   [provider]  traZODone (DESYREL) 150 MG tablet Take 150 mg by mouth at bedtime. 10/30/23   [provider]    Allergies: Bee venom, Lexiscan  [regadenoson ], Statins, and Ace inhibitors    Review of Systems  Updated Vital Signs BP 121/71   Pulse 70   Temp 97.8 F (36.6 C) (Oral)   Resp 19   SpO2 100%   Physical Exam Vitals and nursing note reviewed.  Constitutional:      General: He is not in acute distress.    Appearance: He is well-developed.  HENT:     Head: Normocephalic and atraumatic.  Eyes:     Conjunctiva/sclera: Conjunctivae normal.  Cardiovascular:     Rate and Rhythm: Normal rate and regular rhythm.     Comments: Paced Pulmonary:     Effort: Pulmonary effort is normal. No respiratory distress.     Breath sounds: No stridor.  Abdominal:     General: There is no distension.  Skin:    General: Skin is warm and dry.  Neurological:     Mental Status: He is alert.     Motor: Weakness, atrophy and abnormal muscle tone present.  Psychiatric:         Cognition and Memory: Cognition is impaired. Memory is impaired.     (all labs ordered are listed, but only abnormal results are displayed) Labs Reviewed  COMPREHENSIVE METABOLIC PANEL WITH GFR - Abnormal; Notable for the following components:      Result Value   Potassium 5.2 (*)    Chloride 96 (*)    Glucose, Bld 63 (*)    BUN 69 (*)    Creatinine, Ser 11.28 (*)    Albumin 3.3 (*)    GFR, Estimated 4 (*)    Anion gap 17 (*)    All other  components within normal limits  CBC - Abnormal; Notable for the following components:   RBC 3.27 (*)    Hemoglobin 10.1 (*)    HCT 31.1 (*)    Platelets 88 (*)    All other components within normal limits  CBG MONITORING, ED - Abnormal; Notable for the following components:   Glucose-Capillary 54 (*)    All other components within normal limits  CBG MONITORING, ED - Abnormal; Notable for the following components:   Glucose-Capillary 57 (*)    All other components within normal limits  CBG MONITORING, ED - Abnormal; Notable for the following components:   Glucose-Capillary 129 (*)    All other components within normal limits  URINALYSIS, ROUTINE W REFLEX MICROSCOPIC    EKG: EKG Interpretation Date/Time:  Monday May 14 2024 08:51:58 EST Ventricular Rate:  70 PR Interval:    QRS Duration:  206 QT Interval:  508 QTC Calculation: 548 R Axis:   -83  Text Interpretation: Ventricular-paced rhythm Abnormal ECG Confirmed by Garrick Charleston [phone]) on 05/14/2024 11:51:09 AM  Radiology: DG Chest 2 View Result Date: 05/14/2024 EXAM: 2 VIEW(S) XRAY OF THE CHEST 05/14/2024 12:59:00 PM COMPARISON: 11/11/2023 CLINICAL HISTORY: weakness FINDINGS: LINES, TUBES AND DEVICES: Dual lead pacer with leads at right atrium and right ventricle. LUNGS AND PLEURA: Left greater than right basilar airspace disease. Possible small left pleural effusion versus artifact due to overlying soft tissues. No pneumothorax. HEART AND MEDIASTINUM: Moderate  cardiomegaly. BONES AND SOFT TISSUES: No acute osseous abnormality. LIMITATIONS/ARTIFACTS: Lateral view degraded by patient arm position and overlying artifact. Frontal AP view mildly degraded by patient body habitus. IMPRESSION: 1. Both views are degraded secondary to positioning and technique as detailed above. 2. Possible small left pleural effusion with left greater than right basilar airspace disease. Although this could all represent atelectasis, cannot exclude left lower lobe pneumonia. 3. Cardiomegaly without congestive heart failure. Electronically signed by: Rockey Kilts MD 05/14/2024 01:55 PM EST RP Workstation: [id number]   CT Head Wo Contrast Result Date: 05/14/2024 EXAM: CT HEAD WITHOUT CONTRAST 05/14/2024 12:43:00 PM TECHNIQUE: CT of the head was performed without the administration of intravenous contrast. Automated exposure control, iterative reconstruction, and/or weight based adjustment of the mA/kV was utilized to reduce the radiation dose to as low as reasonably achievable. COMPARISON: CT Head 06/01/23 CLINICAL HISTORY: Mental status change, unknown cause FINDINGS: BRAIN AND VENTRICLES: No acute hemorrhage. No evidence of acute infarct. No hydrocephalus. No extra-axial collection. No mass effect or midline shift. Cerebral atrophy. Patchy white matter hypodensities, compatible with chronic microvascular ischemic changes. 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 ORBITS: No acute abnormality. SINUSES: No acute abnormality. SOFT TISSUES AND SKULL: No acute soft tissue abnormality. No skull fracture. IMPRESSION: 1. No acute intracranial abnormality. 2. Cerebral atrophy and chronic microvascular ischemic change. Electronically signed by: Gilmore Molt MD 05/14/2024 01:01 PM EST RP Workstation: [id number]     Procedures   Medications Ordered in the ED  dextrose  50 % solution 50 mL (50 mLs Intravenous Given 05/14/24 1159)  sodium chloride  0.9 % bolus 500 mL (0  mLs Intravenous Stopped 05/14/24 1411)    Clinical Course as of 05/14/24 1432  Mon May 14, 2024  1140 Blood work returned with sugar of 63.  Now checked fingerstick which shows 54.  Will give oral feeding but will also give D50. [NP]    Clinical Course User Index [NP] Patsey Lot, MD  Medical Decision Making Adult male, on dialysis, multiple prior strokes presents with family concerns of weakness, given missed dialysis several days ago, concern for uremic and cephalopathy versus infection, though the patient is afebrile here.  On exam patient is edematous, suggesting fluid overload status.   Patient was hypoglycemic prior to my initial evaluation received dextrose , reportedly had some amp in his condition, suggesting possible infection.  Pulse ox 99% room air cardiac 70 paced abnormal  Amount and/or Complexity of Data Reviewed Independent Historian: spouse External Data Reviewed: notes.    Details: [employer] notes Labs: ordered. Decision-making details documented in ED Course. Radiology: ordered and independent interpretation performed. Decision-making details documented in ED Course. ECG/medicine tests: ordered and independent interpretation performed. Decision-making details documented in ED Course.  Risk Prescription drug management. Decision regarding hospitalization. Diagnosis or treatment significantly limited by social determinants of health.   2:39 PM At bedside I discussed the patient's presentation with nephrology colleague.  Patient is on schedule for dialysis today.  I reviewed the CT, x-ray, discussed findings with patient, wife, with concern for encephalopathy, fluid overload status, patient will be admitted with anticipated dialysis today. No additional episodes of hypoglycemia, and the patient is on continuous monitoring. With consideration of occult infection patient will have blood culture sent, remain on  monitoring. Patient admitted for further monitoring, management.  CRITICAL CARE Performed by: Lamar Salen Total critical care time: 35 minutes Critical care time was exclusive of separately billable procedures and treating other patients. Critical care was necessary to treat or prevent imminent or life-threatening deterioration. Critical care was time spent personally by me on the following activities: development of treatment plan with patient and/or surrogate as well as nursing, discussions with consultants, evaluation of patient's response to treatment, examination of patient, obtaining history from patient or surrogate, ordering and performing treatments and interventions, ordering and review of laboratory studies, ordering and review of radiographic studies, pulse oximetry and re-evaluation of patient's condition.   Final diagnoses:  Acute encephalopathy  Anasarca  Hypoglycemia    ED Discharge Orders     None          Salen Lamar, MD 05/14/24 1440

## 2024-05-15 DIAGNOSIS — E785 Hyperlipidemia, unspecified: Secondary | ICD-10-CM | POA: Diagnosis present

## 2024-05-15 DIAGNOSIS — Z794 Long term (current) use of insulin: Secondary | ICD-10-CM | POA: Diagnosis not present

## 2024-05-15 DIAGNOSIS — I4819 Other persistent atrial fibrillation: Secondary | ICD-10-CM | POA: Diagnosis present

## 2024-05-15 DIAGNOSIS — I132 Hypertensive heart and chronic kidney disease with heart failure and with stage 5 chronic kidney disease, or end stage renal disease: Secondary | ICD-10-CM | POA: Diagnosis present

## 2024-05-15 DIAGNOSIS — R059 Cough, unspecified: Secondary | ICD-10-CM | POA: Diagnosis not present

## 2024-05-15 DIAGNOSIS — N186 End stage renal disease: Secondary | ICD-10-CM

## 2024-05-15 DIAGNOSIS — Z950 Presence of cardiac pacemaker: Secondary | ICD-10-CM

## 2024-05-15 DIAGNOSIS — Z792 Long term (current) use of antibiotics: Secondary | ICD-10-CM | POA: Diagnosis not present

## 2024-05-15 DIAGNOSIS — I495 Sick sinus syndrome: Secondary | ICD-10-CM | POA: Diagnosis present

## 2024-05-15 DIAGNOSIS — Z8673 Personal history of transient ischemic attack (TIA), and cerebral infarction without residual deficits: Secondary | ICD-10-CM

## 2024-05-15 DIAGNOSIS — R601 Generalized edema: Secondary | ICD-10-CM | POA: Diagnosis present

## 2024-05-15 DIAGNOSIS — D631 Anemia in chronic kidney disease: Secondary | ICD-10-CM | POA: Diagnosis present

## 2024-05-15 DIAGNOSIS — G8929 Other chronic pain: Secondary | ICD-10-CM | POA: Diagnosis present

## 2024-05-15 DIAGNOSIS — I120 Hypertensive chronic kidney disease with stage 5 chronic kidney disease or end stage renal disease: Secondary | ICD-10-CM | POA: Diagnosis not present

## 2024-05-15 DIAGNOSIS — E11319 Type 2 diabetes mellitus with unspecified diabetic retinopathy without macular edema: Secondary | ICD-10-CM | POA: Diagnosis present

## 2024-05-15 DIAGNOSIS — Z992 Dependence on renal dialysis: Secondary | ICD-10-CM

## 2024-05-15 DIAGNOSIS — E11649 Type 2 diabetes mellitus with hypoglycemia without coma: Secondary | ICD-10-CM | POA: Diagnosis present

## 2024-05-15 DIAGNOSIS — E162 Hypoglycemia, unspecified: Secondary | ICD-10-CM | POA: Diagnosis not present

## 2024-05-15 DIAGNOSIS — Z7902 Long term (current) use of antithrombotics/antiplatelets: Secondary | ICD-10-CM | POA: Diagnosis not present

## 2024-05-15 DIAGNOSIS — G9340 Encephalopathy, unspecified: Secondary | ICD-10-CM

## 2024-05-15 DIAGNOSIS — Z1152 Encounter for screening for COVID-19: Secondary | ICD-10-CM | POA: Diagnosis not present

## 2024-05-15 DIAGNOSIS — G928 Other toxic encephalopathy: Secondary | ICD-10-CM | POA: Diagnosis present

## 2024-05-15 DIAGNOSIS — J189 Pneumonia, unspecified organism: Secondary | ICD-10-CM | POA: Diagnosis present

## 2024-05-15 DIAGNOSIS — E1122 Type 2 diabetes mellitus with diabetic chronic kidney disease: Secondary | ICD-10-CM | POA: Diagnosis present

## 2024-05-15 DIAGNOSIS — R4182 Altered mental status, unspecified: Secondary | ICD-10-CM | POA: Diagnosis not present

## 2024-05-15 DIAGNOSIS — F01C18 Vascular dementia, severe, with other behavioral disturbance: Secondary | ICD-10-CM

## 2024-05-15 DIAGNOSIS — Z79899 Other long term (current) drug therapy: Secondary | ICD-10-CM

## 2024-05-15 DIAGNOSIS — I5032 Chronic diastolic (congestive) heart failure: Secondary | ICD-10-CM | POA: Diagnosis present

## 2024-05-15 DIAGNOSIS — F0150 Vascular dementia without behavioral disturbance: Secondary | ICD-10-CM | POA: Diagnosis present

## 2024-05-15 DIAGNOSIS — R918 Other nonspecific abnormal finding of lung field: Secondary | ICD-10-CM

## 2024-05-15 DIAGNOSIS — K5900 Constipation, unspecified: Secondary | ICD-10-CM

## 2024-05-15 DIAGNOSIS — E875 Hyperkalemia: Secondary | ICD-10-CM | POA: Diagnosis present

## 2024-05-15 DIAGNOSIS — E669 Obesity, unspecified: Secondary | ICD-10-CM | POA: Diagnosis present

## 2024-05-15 DIAGNOSIS — J440 Chronic obstructive pulmonary disease with acute lower respiratory infection: Secondary | ICD-10-CM | POA: Diagnosis present

## 2024-05-15 DIAGNOSIS — R569 Unspecified convulsions: Secondary | ICD-10-CM | POA: Diagnosis present

## 2024-05-15 DIAGNOSIS — K5909 Other constipation: Secondary | ICD-10-CM | POA: Diagnosis present

## 2024-05-15 DIAGNOSIS — I5030 Unspecified diastolic (congestive) heart failure: Secondary | ICD-10-CM

## 2024-05-15 DIAGNOSIS — D696 Thrombocytopenia, unspecified: Secondary | ICD-10-CM | POA: Diagnosis present

## 2024-05-15 LAB — GLUCOSE, CAPILLARY
Glucose-Capillary: 55 mg/dL — ABNORMAL LOW (ref 70–99)
Glucose-Capillary: 81 mg/dL (ref 70–99)
Glucose-Capillary: 84 mg/dL (ref 70–99)
Glucose-Capillary: 71 mg/dL (ref 70–99)
Glucose-Capillary: 73 mg/dL (ref 70–99)

## 2024-05-15 LAB — CBC
HCT: 31.9 % — ABNORMAL LOW (ref 39.0–52.0)
Hemoglobin: 10.5 g/dL — ABNORMAL LOW (ref 13.0–17.0)
MCH: 30.8 pg (ref 26.0–34.0)
MCHC: 32.9 g/dL (ref 30.0–36.0)
MCV: 93.5 fL (ref 80.0–100.0)
Platelets: 87 K/uL — ABNORMAL LOW (ref 150–400)
RBC: 3.41 MIL/uL — ABNORMAL LOW (ref 4.22–5.81)
RDW: 13.4 % (ref 11.5–15.5)
WBC: 3.8 K/uL — ABNORMAL LOW (ref 4.0–10.5)
nRBC: 0 % (ref 0.0–0.2)

## 2024-05-15 LAB — RENAL FUNCTION PANEL
Albumin: 3.4 g/dL — ABNORMAL LOW (ref 3.5–5.0)
Anion gap: 17 — ABNORMAL HIGH (ref 5–15)
BUN: 43 mg/dL — ABNORMAL HIGH (ref 8–23)
CO2: 26 mmol/L (ref 22–32)
Calcium: 9.3 mg/dL (ref 8.9–10.3)
Chloride: 93 mmol/L — ABNORMAL LOW (ref 98–111)
Creatinine, Ser: 8.45 mg/dL — ABNORMAL HIGH (ref 0.61–1.24)
GFR, Estimated: 6 mL/min — ABNORMAL LOW (ref >60)
Glucose, Bld: 84 mg/dL (ref 70–99)
Phosphorus: 4.6 mg/dL (ref 2.5–4.6)
Potassium: 4.4 mmol/L (ref 3.5–5.1)
Sodium: 136 mmol/L (ref 135–145)

## 2024-05-15 LAB — VALPROIC ACID LEVEL: Valproic Acid Lvl: <10 ug/mL — ABNORMAL LOW (ref 50–100)

## 2024-05-15 MED ORDER — DICLOFENAC SODIUM 1 % EX GEL
4.0000 g | Freq: Four times a day (QID) | CUTANEOUS | Status: DC
  Administered 2024-05-15 – 2024-05-20 (×15): 4 g via TOPICAL
  Filled 2024-05-15 (×2): qty 100

## 2024-05-15 NOTE — Evaluation (Signed)
 Occupational Therapy Evaluation Patient Details Name: Gregory Hood MRN: [id number] DOB: [birth year] Today's Date: 05/15/2024   History of Present Illness   73 y.o. male presents to [facility] hospital on 05/14/2024 with AMS. Pt missed dialysis recently. CXR concerning for possible LLL PNA. PMH includes ESRD, CVA, vascular dementia, HFpEF, afib s/p PPM, chronic pain.     Clinical Impressions PTA, pt is a LTC resident at [facility]. Per family present, pt uses sit to stand lift for OOB transfers and has assist for all ADLs. Pt presents now limited by lethargy and baseline cognitive impairments. Pt requires Max-Total A x 2 for bed mobility and partial standing attempts at bedside. Pt grimacing with movements but unable to specify location of discomfort - RN present and aware. Pt currently requiring Total A for all ADLs bed level though feel if lethargy improves, pt would be able to assist with simple UB ADLs. Will follow distantly while admitted and recommend return to SNF upon DC.     If plan is discharge home, recommend the following:   A lot of help with walking and/or transfers;Two people to help with walking and/or transfers;A lot of help with bathing/dressing/bathroom;Assistance with feeding     Functional Status Assessment   Patient has had a recent decline in their functional status and demonstrates the ability to make significant improvements in function in a reasonable and predictable amount of time.     Equipment Recommendations   None recommended by OT     Recommendations for Other Services         Precautions/Restrictions   Precautions Precautions: Fall Restrictions Weight Bearing Restrictions Per Provider Order: No     Mobility Bed Mobility Overal bed mobility: Needs Assistance Bed Mobility: Supine to Sit, Sit to Supine     Supine to sit: Total assist, +2 for physical assistance, +2 for safety/equipment, HOB elevated Sit to supine: Max assist,  +2 for physical assistance, +2 for safety/equipment   General bed mobility comments: minimal initiation to EOB, assist for BLE and trunk lifting needed. On return to supine, pt able to assist in bringing LE back up to bed when cued with assist for trunk guidance    Transfers Overall transfer level: Needs assistance Equipment used: 2 person hand held assist Transfers: Sit to/from Stand Sit to Stand: Total assist, +2 physical assistance, +2 safety/equipment           General transfer comment: Total A x 2 for two partial stands at bedside. Pt with difficulty initiating, tucking bottom in and at times felt to be pushing UE rather than pulling as cued.      Balance Overall balance assessment: Needs assistance Sitting-balance support: Feet supported, Bilateral upper extremity supported, Single extremity supported, No upper extremity supported Sitting balance-Leahy Scale: Poor Sitting balance - Comments: L lateral lean requiring Mod A to correct intermittently Postural control: Left lateral lean Standing balance support: Bilateral upper extremity supported, During functional activity Standing balance-Leahy Scale: Zero                             ADL either performed or assessed with clinical judgement   ADL Overall ADL's : Needs assistance/impaired Eating/Feeding: Total assistance;Sitting Eating/Feeding Details (indicate cue type and reason): assist to hold cup, drink from straw in juice cup sitting EOB Grooming: Total assistance   Upper Body Bathing: Total assistance   Lower Body Bathing: Total assistance;Bed level   Upper Body Dressing :  Total assistance;Bed level   Lower Body Dressing: Total assistance;Bed level       Toileting- Clothing Manipulation and Hygiene: Total assistance;Bed level               Vision Ability to See in Adequate Light: 2 Moderately impaired Patient Visual Report: No change from baseline Vision Assessment?: Vision impaired- to be  further tested in functional context Additional Comments: L eye blindness     Perception         Praxis         Pertinent Vitals/Pain Pain Assessment Pain Assessment: Faces Faces Pain Scale: Hurts even more Pain Location: pt unable to state Pain Descriptors / Indicators: Grimacing Pain Intervention(s): Monitored during session, Other (comment) (RN present and aware)     Extremity/Trunk Assessment Upper Extremity Assessment Upper Extremity Assessment: Difficult to assess due to impaired cognition   Lower Extremity Assessment Lower Extremity Assessment: Defer to PT evaluation   Cervical / Trunk Assessment Cervical / Trunk Assessment: Normal   Communication Communication Communication: Impaired Factors Affecting Communication: Difficulty expressing self   Cognition Arousal: Lethargic Behavior During Therapy: Flat affect Cognition: Cognition impaired, Difficult to assess Difficult to assess due to: Level of arousal           OT - Cognition Comments: hx of dementia, pt awakens to name, keeps eyes closed for much of session. inconsistent command following. able to answer yes/no questions at times                 Following commands: Impaired Following commands impaired: Follows one step commands inconsistently, Follows one step commands with increased time     Cueing  General Comments   Cueing Techniques: Verbal cues;Gestural cues;Tactile cues  Granddaughter, Faith, at bedside, supportive and providing PLOF info   Exercises     Shoulder Instructions      Home Living Family/patient expects to be discharged to:: Skilled nursing facility                                 Additional Comments: [facility]      Prior Functioning/Environment Prior Level of Function : Needs assist             Mobility Comments: per granddaughter, pt uses sit to stand lift for transfers OOB to wheelchair ADLs Comments: assist with  bathing, dressing and toileting. Granddaughter also reports assist for meals    OT Problem List: Decreased strength;Decreased activity tolerance;Impaired balance (sitting and/or standing);Decreased cognition;Impaired vision/perception   OT Treatment/Interventions: Self-care/ADL training;Therapeutic exercise;Energy conservation;DME and/or AE instruction;Therapeutic activities;Patient/family education      OT Goals(Current goals can be found in the care plan section)   Acute Rehab OT Goals Patient Stated Goal: granddaughter hopeful for pt to get back to a normal sleep schedule OT Goal Formulation: With family Time For Goal Achievement: 05/29/24 Potential to Achieve Goals: Fair   OT Frequency:  Min 1X/week    Co-evaluation PT/OT/SLP Co-Evaluation/Treatment: Yes Reason for Co-Treatment: For patient/therapist safety;To address functional/ADL transfers;Necessary to address cognition/behavior during functional activity PT goals addressed during session: Strengthening/ROM;Balance OT goals addressed during session: Strengthening/ROM      AM-PAC OT 6 Clicks Daily Activity     Outcome Measure Help from another person eating meals?: Total Help from another person taking care of personal grooming?: Total Help from another person toileting, which includes using toliet, bedpan, or urinal?: Total Help from another person bathing (  including washing, rinsing, drying)?: Total Help from another person to put on and taking off regular upper body clothing?: Total Help from another person to put on and taking off regular lower body clothing?: Total 6 Click Score: 6   End of Session Equipment Utilized During Treatment: Gait belt Nurse Communication: Mobility status;Other (comment) (pain meds)  Activity Tolerance: Patient limited by lethargy Patient left: in bed;with bed alarm set;with call bell/phone within reach;with family/visitor present  OT Visit Diagnosis: Unsteadiness on feet  (R26.81);Muscle weakness (generalized) (M62.81);Other symptoms and signs involving cognitive function                Time: 1353-1408 OT Time Calculation (min): 15 min Charges:  OT General Charges $OT Visit: 1 Visit OT Evaluation $OT Eval Moderate Complexity: 1 Mod  Mliss NOVAK, OTR/L Acute Rehab Services Office: [phone]   Mliss Fish 05/15/2024, 2:22 PM

## 2024-05-15 NOTE — Progress Notes (Signed)
 Pharmacist rounding with the [facility]. Drug monitoring level of divalproex (DEPAKOTE) 125 mg DR tablet which the patient's family states has been taking (as well as documented on the medication administration record from the facility the patient was transferred from to hospital) for mood stabilization, value is < 10 micrograms/mL collected at 1236 hours today. Normal reference  range for this assay of divalproex is 50 - 100 micrograms/mL.   Lynwood Lites, PharmD, CPP Clinical Pharmacist Practitioner

## 2024-05-15 NOTE — Evaluation (Signed)
 Physical Therapy Evaluation Patient Details Name: FRANKIE SCIPIO MRN: [id number] DOB: 11-Mar-1951 Today's Date: 05/15/2024  History of Present Illness  73 y.o. male presents to [facility] hospital on 05/14/2024 with AMS. Pt missed dialysis recently. CXR concerning for possible LLL PNA. PMH includes ESRD, CVA, vascular dementia, HFpEF, afib s/p PPM, chronic pain.  Clinical Impression  Pt presents to PT with deficits in cognition, arousal, functional mobility, balance, strength. Pt's granddaughter is present and reports the staff at pts LTC facility utilize a sit to stand assist device to aide in transferring the patient from bed to wheelchair. Pt is dependent for wheelchair mobility at baseline. Currently the pt requires significant assistance to perform bed mobility and to transfer, initially appearing lethargic but arousing some with conversation and mobility. Pt will benefit from utilization of lift equipment to transfer out of bed to the recliner or to a wheelchair if available during this admission. PT recommends return to LTC without PT follow-up when medically appropriate.        If plan is discharge home, recommend the following: Two people to help with walking and/or transfers;Two people to help with bathing/dressing/bathroom;Assistance with cooking/housework;Assistance with feeding;Direct supervision/assist for medications management;Direct supervision/assist for financial management;Assist for transportation;Help with stairs or ramp for entrance;Supervision due to cognitive status   Can travel by private vehicle   No    Equipment Recommendations None recommended by PT  Recommendations for Other Services       Functional Status Assessment Patient has had a recent decline in their functional status and demonstrates the ability to make significant improvements in function in a reasonable and predictable amount of time.     Precautions / Restrictions Precautions Precautions: Fall Recall of  Precautions/Restrictions: Impaired Restrictions Weight Bearing Restrictions Per Provider Order: No      Mobility  Bed Mobility Overal bed mobility: Needs Assistance Bed Mobility: Supine to Sit, Sit to Supine     Supine to sit: Total assist, +2 for physical assistance, +2 for safety/equipment, HOB elevated Sit to supine: Max assist, +2 for physical assistance, +2 for safety/equipment   General bed mobility comments: pt initiates sit to supine by elevating BLE and lying onto R side. Pt requires near totalA for supine to sit, pulling some with BUE with hand hold but providing <25% of total effort    Transfers Overall transfer level: Needs assistance Equipment used: 2 person hand held assist Transfers: Sit to/from Stand Sit to Stand: Total assist, +2 physical assistance, +2 safety/equipment           General transfer comment: Total A x 2 for two partial stands at bedside. Pt with difficulty initiating, tucking bottom in and at times felt to be pushing UE rather than pulling as cued.    Ambulation/Gait                  Stairs            Wheelchair Mobility     Tilt Bed    Modified Rankin (Stroke Patients Only)       Balance Overall balance assessment: Needs assistance Sitting-balance support: Single extremity supported, Feet supported Sitting balance-Leahy Scale: Poor Sitting balance - Comments: L lateral lean requiring Mod A to correct intermittently Postural control: Left lateral lean Standing balance support: Bilateral upper extremity supported, During functional activity Standing balance-Leahy Scale: Zero Standing balance comment: totalA for brief period of standing  Pertinent Vitals/Pain Pain Assessment Pain Assessment: PAINAD Breathing: normal Negative Vocalization: none Facial Expression: facial grimacing Body Language: tense, distressed pacing, fidgeting Consolability: no need to console PAINAD  Score: 3 Pain Location: pt with intermittent grimacing with activity, unable to report locaion of pain Pain Descriptors / Indicators: Grimacing Pain Intervention(s): Monitored during session    Home Living Family/patient expects to be discharged to:: Skilled nursing facility                   Additional Comments: [facility]    Prior Function Prior Level of Function : Needs assist             Mobility Comments: per granddaughter, pt uses sit to stand lift for transfers OOB to wheelchair ADLs Comments: assist with bathing, dressing and toileting. Granddaughter also reports assist for meals     Extremity/Trunk Assessment   Upper Extremity Assessment Upper Extremity Assessment: Defer to OT evaluation    Lower Extremity Assessment Lower Extremity Assessment: Generalized weakness    Cervical / Trunk Assessment Cervical / Trunk Assessment: Kyphotic  Communication   Communication Communication: Impaired Factors Affecting Communication: Difficulty expressing self    Cognition Arousal: Lethargic Behavior During Therapy: Flat affect   PT - Cognitive impairments: History of cognitive impairments                       PT - Cognition Comments: history of vascular dementia per chart. Granddaughter reports variable levels of conversation at baseline Following commands: Impaired Following commands impaired: Follows one step commands with increased time, Follows multi-step commands inconsistently     Cueing Cueing Techniques: Verbal cues, Gestural cues, Tactile cues, Visual cues     General Comments General comments (skin integrity, edema, etc.): Granddaughter, Faith, at bedside, supportive and providing PLOF info    Exercises     Assessment/Plan    PT Assessment Patient needs continued PT services  PT Problem List Decreased strength;Decreased activity tolerance;Decreased balance;Decreased mobility;Decreased cognition;Decreased  knowledge of use of DME;Decreased safety awareness;Decreased knowledge of precautions;Pain       PT Treatment Interventions DME instruction;Functional mobility training;Therapeutic activities;Therapeutic exercise;Balance training;Neuromuscular re-education;Cognitive remediation;Patient/family education    PT Goals (Current goals can be found in the Care Plan section)  Acute Rehab PT Goals Patient Stated Goal: to continue to transfer out of bed to wheelchair PT Goal Formulation: With family Time For Goal Achievement: 05/29/24 Potential to Achieve Goals: Fair    Frequency Min 1X/week     Co-evaluation PT/OT/SLP Co-Evaluation/Treatment: Yes Reason for Co-Treatment: Complexity of the patient's impairments (multi-system involvement);For patient/therapist safety;Necessary to address cognition/behavior during functional activity;To address functional/ADL transfers PT goals addressed during session: Mobility/safety with mobility;Balance;Proper use of DME;Strengthening/ROM OT goals addressed during session: Strengthening/ROM       AM-PAC PT 6 Clicks Mobility  Outcome Measure Help needed turning from your back to your side while in a flat bed without using bedrails?: Total Help needed moving from lying on your back to sitting on the side of a flat bed without using bedrails?: Total Help needed moving to and from a bed to a chair (including a wheelchair)?: Total Help needed standing up from a chair using your arms (e.g., wheelchair or bedside chair)?: Total Help needed to walk in hospital room?: Total Help needed climbing 3-5 steps with a railing? : Total 6 Click Score: 6    End of Session Equipment Utilized During Treatment: Gait belt Activity Tolerance: Patient limited by lethargy Patient left:  in bed;with call bell/phone within reach;with bed alarm set;with family/visitor present Nurse Communication: Mobility status;Need for lift equipment PT Visit Diagnosis: Other abnormalities of  gait and mobility (R26.89);Muscle weakness (generalized) (M62.81)    Time: 8646-8591 PT Time Calculation (min) (ACUTE ONLY): 15 min   Charges:   PT Evaluation $PT Eval Low Complexity: 1 Low   PT General Charges $$ ACUTE PT VISIT: 1 Visit         Bernardino JINNY Ruth, PT, DPT Acute Rehabilitation Office [phone]   Bernardino JINNY Ruth 05/15/2024, 2:40 PM

## 2024-05-15 NOTE — Progress Notes (Addendum)
 HD#0 SUBJECTIVE:  Patient Summary: Gregory Hood is a 73 y.o. male with ESRD on HD, multiple prior strokes with vascular dementia, HFpEF, and AF s/p pacemaker who presented with acute encephalopathy and weakness, admitted for evaluation and management of metabolic encephalopathy on hospital day 1.  Overnight Events: NAEON  Interim History: Patient was resting in bed without signs of acute distress, though he grimaced frequently when attempting to roll. He was less communicative today compared to yesterday. His wife reported that they were unable to sleep last night due to his hemodialysis session and believes that some of his confusion may be related to lack of sleep. The patient denied fever, shortness of breath, and chest pain. He has had a dry cough for the past two days without sputum production.  Vital Signs: Vitals:   05/15/24 0345 05/15/24 0523 05/15/24 0737 05/15/24 1200  BP: 131/73 (!) 122/57 137/83 107/71  Pulse: 70 71 71 70  Resp: 18 16 18 18   Temp: 98.2 F (36.8 C) (!) 97.4 F (36.3 C) 98 F (36.7 C) 98 F (36.7 C)  TempSrc: Oral Axillary    SpO2: 100% 99% 100% 100%   Supplemental O2: Room Air SpO2: 100 %  There were no vitals filed for this visit.   Intake/Output Summary (Last 24 hours) at 05/15/2024 1419 Last data filed at 05/15/2024 0345 Gross per 24 hour  Intake --  Output 1400 ml  Net -1400 ml   Net IO Since Admission: -900 mL [05/15/24 1419]  Physical Exam: Physical Exam Constitutional: Cachectic, sleeping in bed, in no acute distress. Oriented to self but not to his wife; more delirious compared to yesterday. Does not follow commands. HENT: Normocephalic and atraumatic; mucous membranes moist. Eyes: Left eye blind with opaque pupil; right pupil constricted; conjunctivae non-erythematous. Neck: Supple; no meningismus. Cardiovascular: Regular rate and rhythm; no murmurs, rubs, or gallops. Pulmonary/Chest: Normal work of breathing on room air; lungs  clear to auscultation bilaterally. Abdomen: Soft, non-tender, non-distended; no guarding or rebound. MSK: Poor muscle bulk, particularly in the lower extremities; chronic generalized weakness; normal tone in upper and lower extremities. Neurological: Somnolent but arousable; oriented to self only; limited verbal output; does not follow commands; moves all extremities spontaneously; no new focal deficits appreciated. Skin: Warm and dry; no rashes or lesions. Psych: Flat affect; limited engagement.  Patient Lines/Drains/Airways Status     Active Line/Drains/Airways     Name Placement date Placement time Site Days   Peripheral IV 05/15/24 22 G 2.5 Anterior;Right Forearm 05/15/24  0631  Forearm  less than 1   Fistula / Graft Left Upper arm Arteriovenous fistula 03/06/15  1150  Upper arm  3358   Fistula / Graft Left Upper arm 03/22/18  0627  Upper arm  2246            Pertinent labs and imaging:      Latest Ref Rng & Units 05/15/2024   12:36 PM 05/14/2024    9:30 AM 11/13/2023    9:41 AM  CBC  WBC 4.0 - 10.5 K/uL 3.8  4.3  6.6   Hemoglobin 13.0 - 17.0 g/dL 89.4  89.8  87.3   Hematocrit 39.0 - 52.0 % 31.9  31.1  39.0   Platelets 150 - 400 K/uL 87  88  118        Latest Ref Rng & Units 05/15/2024   12:36 PM 05/14/2024    9:30 AM 11/13/2023    9:41 AM  CMP  Glucose 70 -  99 mg/dL 84  63  874   BUN 8 - 23 mg/dL 43  69  32   Creatinine 0.61 - 1.24 mg/dL 1.54  88.71  0.77   Sodium 135 - 145 mmol/L 136  140  135   Potassium 3.5 - 5.1 mmol/L 4.4  5.2  4.6   Chloride 98 - 111 mmol/L 93  96  98   CO2 22 - 32 mmol/L 26  27  19    Calcium  8.9 - 10.3 mg/dL 9.3  9.2  7.4   Total Protein 6.5 - 8.1 g/dL  7.0  6.4   Total Bilirubin 0.0 - 1.2 mg/dL  1.0  0.8   Alkaline Phos 38 - 126 U/L  65  61   AST 15 - 41 U/L  20  27   ALT 0 - 44 U/L  11  8     No results found.  ASSESSMENT/PLAN:  Assessment: Principal Problem:   Altered mental status Active Problems:   Essential  hypertension, benign   DM (diabetes mellitus), type 2 with renal complications (HCC)   End stage renal disease (HCC)   BPH (benign prostatic hyperplasia)   HFrEF (heart failure with reduced ejection fraction) (HCC)   SSS (sick sinus syndrome) (HCC)   Polypharmacy   Hypoglycemia   Vascular dementia (HCC)   ESRD on dialysis (HCC)   Plan: [name] is a 73 y.o. male with ESRD on HD, multiple prior strokes with vascular dementia, HFpEF, and AF s/p pacemaker who presented with acute encephalopathy and weakness, admitted for evaluation and management of metabolic encephalopathy on hospital day 1.   # Acute Encephalopathy Subacute mental status decline over the past 2-3 days per wife, with the patient now somnolent and minimally verbal. Etiology is likely multifactorial, including missed dialysis leading to uremia, recent increase in tramadol  use, hypoglycemia noted in the ED, and possible early infection. CT head was negative, and no focal neurological deficits were observed.  Today, the patient's mentation is worse compared to yesterday; he did not recognize his wife and was unable to follow simple commands (e.g., opening his eyes), which he had been able to do previously. He underwent hemodialysis last night and was unable to sleep. His inattention and disorientation are likely multifactorial, potentially related to delirium, uremia, underlying vascular dementia, and chronic cerebral infarcts. Plan: - HD yesterday and back to routine MWF - Hold tramadol  and other sedating meds like trazodone  - Neuro checks q4h - Trend glucose AC/HS - Follow cultures; start antibiotics only if fever/WBC rise or respiratory decline - Monitor mental status after HD - Consider MRI if no improvement  - Depakote level below 10  # ESRD on Hemodialysis (MWF) Missed Friday HD due to concern for fistula infiltration and wife declining treatment; now with elevated BUN/Cr and mild hyperkalemia. Plan: - HD today  per nephrology - BMP in AM - strict I/O; daily weights - Avoid nephrotoxic meds  #polypharmacy - Discontinue losartan/HCTZ as the patient is anuric; start losartan 100 mg daily. - Discontinue finasteride  (patient is not producing urine). - Discontinue multivitamin, vitamin C, folic acid , and red yeast rice. - Hold tramadol  and trazodone due to altered mental status. Notably, the patient has not been taking Eliquis or carvedilol .  # Possible LLL Pneumonia vs Atelectasis CXR with basilar opacities and possible small L pleural effusion; afebrile and no leukocytosis here; SNF denies respiratory symptoms. Plan: - Monitor symptoms and vitals - Consider starting empiric ceftriaxone and azithromycin only if clinical  decline - Incentive spirometry  # Hypoglycemia Improved.  Plan: - AC/HS glucose checks - Ensure adequate PO intake - Review meds for hypoglycemia risk (none significant)  # Atrial Fibrillation s/p Pacemaker V-paced rhythm on EKG; rate controlled. Plan: Continue amiodarone Continue aspirin  + clopidogrel  Telemetry monitoring  # HFpEF Mild volume overload likely from missed HD. Plan: - HD as above - Continue losartan-HCTZ and spironolactone - Monitor respiratory status, daily weights -Fluid restriction (2 L)  # Multiple Prior CVA / Vascular Dementia Lower neurologic reserve; worsened by metabolic derangements. Plan: - Continue memantine  - Address reversible contributors (uremia, pain meds, hypoglycemia)  # Chronic Pain Tramadol  recently increased at SNF; high risk in ESRD. Plan: - HOLD tramadol  - Scheduled Tylenol  PRN - Avoid opioids unless severe breakthrough  # Constipation At baseline; on stool softener. Plan: Continue stool regimen Monitor BM frequency  # Chronic Anemia of CKD Hgb 10.1, stable. Plan: Continue iron + folate Monitor CBC  # Thrombocytopenia Platelets 88; chronic. Plan: - Monitor CBC - Continue heparin  for DVT ppx unless  bleeding   Best practice: Diet: Normal VTE: Heparin  IVF: None,None Code: Full   Disposition planning: Prior to Admission Living Arrangement: SNF, [facility] [place] Anticipated Discharge Location: SNF Signature:  Alfa Leibensperger [facility]  2:19 PM, 05/15/2024  On Call pager [phone]

## 2024-05-15 NOTE — Progress Notes (Signed)
  [facility] Progress Note   Subjective: Seen in room. Completed dialysis early this am.  Wife at bedside. He remains lethargic, not back to usual baseline.   Objective Vitals:   05/15/24 0330 05/15/24 0345 05/15/24 0523 05/15/24 0737  BP: 118/70 131/73 (!) 122/57 137/83  Pulse:  70 71 71  Resp: 18 18 16 18   Temp:  98.2 F (36.8 C) (!) 97.4 F (36.3 C) 98 F (36.7 C)  TempSrc:  Oral Axillary   SpO2: 100% 100% 99% 100%    Additional Objective Labs: Basic Metabolic Panel: Recent Labs  Lab 05/14/24 0930 05/14/24 1700  NA 140  --   K 5.2*  --   CL 96*  --   CO2 27  --   GLUCOSE 63*  --   BUN 69*  --   CREATININE 11.28*  --   CALCIUM  9.2  --   PHOS  --  6.1*   CBC: Recent Labs  Lab 05/14/24 0930  WBC 4.3  HGB 10.1*  HCT 31.1*  MCV 95.1  PLT 88*   Blood Culture    Component Value Date/Time   SDES BLOOD SITE NOT SPECIFIED 05/14/2024 1700   SPECREQUEST  05/14/2024 1700    BOTTLES DRAWN AEROBIC AND ANAEROBIC Blood Culture adequate volume   CULT  05/14/2024 1700    NO GROWTH < 24 HOURS Performed at [facility], [address], [place], [place] [address]    REPTSTATUS PENDING 05/14/2024 1700     Physical Exam General: Chronically ill appearing, nad Heart: RRR Lungs: Clear, normal wob Abdomen: non tender Extremities: no sig LE edema  Dialysis Access: L arm AVF +bruit   Medications:   acetaminophen   1,000 mg Oral Q6H   Or   acetaminophen   650 mg Rectal Q6H   amiodarone  200 mg Oral Daily   aspirin  EC  81 mg Oral QHS   calcium  acetate  667 mg Oral TID with meals   Chlorhexidine  Gluconate Cloth  6 each Topical Q0600   clopidogrel   75 mg Oral Daily   divalproex  125 mg Oral Q1200   heparin   5,000 Units Subcutaneous Q8H   insulin  aspart  0-6 Units Subcutaneous TID WC   lidocaine   2 patch Transdermal Q24H   losartan  100 mg Oral QHS   melatonin  3 mg Oral QHS   memantine   10 mg Oral BID   spironolactone  50 mg Oral Daily     Dialysis Orders:  MWF [place] 3h  B450  79.8kg   AVG    Heparin  none Last OP HD 11/14 Friday, post wt 80.0kg, only stayed on 1 min Signing off sig early the past 1-2 wks, 2- 3hrs  Last Hb 11.3 on 11/12 Last mircera 30mg  on 10/13  Assessment/Plan: Acute encephalopathy. Multifactorial. No acute findings on Head CT.  Concern for polypharmacy. Sedating meds held. Missed dialysis prior to admit. Follow.  ESRD. HD MWF.  Continue on schedule. Next HD Wed.  HTN. BP acceptable.  Volume. Appears euvolemic  Anemia. Hgb 10-12. No ESA needs currently. Follow.  2HPTH. Phos elevated. Resume home meds. Follow.  Dementia SNF dependent: per family member  [name] PA-C [facility] 05/15/2024,9:26 AM

## 2024-05-15 NOTE — Evaluation (Addendum)
 Clinical/Bedside Swallow Evaluation Patient Details  Name: Gregory Hood MRN: [id number] Date of Birth: 29-May-1951  Today's Date: 05/15/2024 Time: SLP Start Time (ACUTE ONLY): 0934 SLP Stop Time (ACUTE ONLY): 1000 SLP Time Calculation (min) (ACUTE ONLY): 26 min  Past Medical History:  Past Medical History:  Diagnosis Date   COPD (chronic obstructive pulmonary disease) (HCC)    Diabetes mellitus without complication (HCC)    Diabetic retinopathy (HCC)    ESRD on hemodialysis (HCC)    Hyperlipidemia    Hypertension    Obesity    Orthostatic hypotension    OSA on CPAP    Retinal detachment    Seizures (HCC)    Stroke (HCC)    TIA (transient ischemic attack)    Umbilical hernia    Past Surgical History:  Past Surgical History:  Procedure Laterality Date   BASCILIC VEIN TRANSPOSITION Left 03/06/2015   Procedure: LET BASCILIC VEIN TRANSPOSITION;  Surgeon: Lonni GORMAN Blade, MD;  Location: [facility] OR;  Service: Vascular;  Laterality: Left;   CIRCUMCISION     EYE SURGERY Bilateral    had surgery several different times.   HAMMER TOE SURGERY Left 30 years ago   small toe left toe   HERNIA REPAIR     IR AV DIALY SHUNT INTRO NEEDLE/INTRACATH INITIAL W/PTA/IMG LEFT  07/20/2018   SEPTOPLASTY  20 years ago   HPI:  Patient is a 73 y.o. 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 Patient's wife reports a new dry cough that began on 11/16 but no shortness of breath or chest pain. Bedside swallow evaluation ordered to assess patient's swallow.    Assessment / Plan / Recommendation  Clinical Impression  SLP noted clinical s/s of dysphagia per BSE through coughing with thin liquids and oral holding puree solids. Patient's wife was at bedside who shared  with SLP that patient appears weaker than baseline and is not eating as much, however is still eating a sufficient amount during meals. Wife shared patient has had a continuous dry cough since 11/16 noted twice after consecutive straw sips. She shared that cutting up his food is helpful for him to slow down and not over eat to the point of throwing up which she shared has happened during this hospitilization. SLP and wife in agreement that Dys 2 (minced) would be beneficial to the patient and wife who is feeding him. SLP communciated diet recommendations and treatment plan with RN and will continue to follow for diet toleration.  SLP Visit Diagnosis: Dysphagia, unspecified (R13.10)    Aspiration Risk       Diet Recommendation Dysphagia 2 (Fine chop);Thin liquid    Liquid Administration via: Cup;Straw Medication Administration: Other (Comment) (As tolerated) Supervision: Staff to assist with self feeding;Full supervision/cueing for compensatory strategies Compensations: Minimize environmental distractions;Slow rate;Small sips/bites Postural Changes: Seated upright at 90 degrees;Remain upright for at least 30 minutes after po intake    Other  Recommendations Oral Care Recommendations: Oral care BID     Assistance Recommended at Discharge    Functional Status Assessment Patient has had a recent decline in their functional status and demonstrates the ability to make significant improvements in function in a reasonable and predictable amount of time.  Frequency and Duration min 1 x/week  1 week  Prognosis Prognosis for improved oropharyngeal function: Fair      Swallow Study   General Date of Onset: 05/15/24 HPI: Patient is a 73 y.o. male with history of ESRD on HD (MWF), multiple prior strokes, vascular dementia, HFpEF, atrial fibrillation on rhythm control with pacemaker, chronic pain, and functional dependence at baseline, who presented from his rehabilitation facility on 05/14/24  for acute change in mental status. Patient's wife reports a new dry cough that began on 11/16 but no shortness of breath or chest pain. Bedside swallow evaluation ordered to assess patient's swallow. Type of Study: Bedside Swallow Evaluation Diet Prior to this Study: Dysphagia 3 (mechanical soft);Thin liquids (Level 0) Temperature Spikes Noted: No Respiratory Status: Room air History of Recent Intubation: No Behavior/Cognition: Alert;Cooperative Oral Cavity Assessment: Within Functional Limits Oral Care Completed by SLP: No Oral Cavity - Dentition: Missing dentition Vision: Functional for self-feeding Self-Feeding Abilities: Needs assist;Needs set up Patient Positioning: Upright in bed    Oral/Motor/Sensory Function Overall Oral Motor/Sensory Function: Within functional limits   Ice Chips     Thin Liquid Thin Liquid: Impaired Presentation: Straw Pharyngeal  Phase Impairments: Cough - Immediate Other Comments: Wife shared that coughing doesn't happen normally, he has had a dry cough since the 16th.    Nectar Thick     Honey Thick     Puree Puree: Impaired Presentation: Spoon Oral Phase Functional Implications: Oral holding   Solid           Damien Hy  Graduate SLP Clinican

## 2024-05-15 NOTE — Progress Notes (Signed)
   05/15/24 0345  Vitals  Temp 98.2 F (36.8 C)  Temp Source Oral  BP 131/73  MAP (mmHg) 96  BP Location Right Arm  BP Method Automatic  Patient Position (if appropriate) Lying  Pulse Rate 70  Pulse Rate Source Monitor  ECG Heart Rate (!) 18  Resp 18  Oxygen Therapy  SpO2 100 %  O2 Device Room Air  During Treatment Monitoring  Blood Flow Rate (mL/min) 0 mL/min  Arterial Pressure (mmHg) 0 mmHg  Venous Pressure (mmHg) 0 mmHg  TMP (mmHg) 18.38 mmHg  Ultrafiltration Rate (mL/min) 786 mL/min  Dialysate Flow Rate (mL/min) 300 ml/min  Dialysate Potassium Concentration 2  Dialysate Calcium  Concentration 2.5  Duration of HD Treatment -hour(s) 3.05 hour(s)  Cumulative Fluid Removed (mL) per Treatment  1349.97  HD Safety Checks Performed Yes  Intra-Hemodialysis Comments Tx completed  Dialysis Fluid Bolus Normal Saline  Dialysate Change 2K;2.5 Ca  Post Treatment  Dialyzer Clearance Lightly streaked  Liters Processed 64.3  Fluid Removed (mL) 1400 mL  Tolerated HD Treatment Yes  Post-Hemodialysis Comments  (Pt wife request 3hrs only)  AVG/AVF Arterial Site Held (minutes) 10 minutes  AVG/AVF Venous Site Held (minutes) 10 minutes

## 2024-05-15 NOTE — Plan of Care (Signed)

## 2024-05-16 DIAGNOSIS — G9340 Encephalopathy, unspecified: Secondary | ICD-10-CM | POA: Diagnosis not present

## 2024-05-16 DIAGNOSIS — D631 Anemia in chronic kidney disease: Secondary | ICD-10-CM | POA: Diagnosis not present

## 2024-05-16 DIAGNOSIS — N186 End stage renal disease: Secondary | ICD-10-CM | POA: Diagnosis not present

## 2024-05-16 DIAGNOSIS — Z992 Dependence on renal dialysis: Secondary | ICD-10-CM | POA: Diagnosis not present

## 2024-05-16 DIAGNOSIS — I5032 Chronic diastolic (congestive) heart failure: Secondary | ICD-10-CM

## 2024-05-16 DIAGNOSIS — F01B18 Vascular dementia, moderate, with other behavioral disturbance: Secondary | ICD-10-CM

## 2024-05-16 LAB — RENAL FUNCTION PANEL
Albumin: 3.3 g/dL — ABNORMAL LOW (ref 3.5–5.0)
Anion gap: 19 — ABNORMAL HIGH (ref 5–15)
BUN: 46 mg/dL — ABNORMAL HIGH (ref 8–23)
CO2: 24 mmol/L (ref 22–32)
Calcium: 9.3 mg/dL (ref 8.9–10.3)
Chloride: 95 mmol/L — ABNORMAL LOW (ref 98–111)
Creatinine, Ser: 9.12 mg/dL — ABNORMAL HIGH (ref 0.61–1.24)
GFR, Estimated: 6 mL/min — ABNORMAL LOW (ref >60)
Glucose, Bld: 88 mg/dL (ref 70–99)
Phosphorus: 4.9 mg/dL — ABNORMAL HIGH (ref 2.5–4.6)
Potassium: 4.9 mmol/L (ref 3.5–5.1)
Sodium: 138 mmol/L (ref 135–145)

## 2024-05-16 LAB — GLUCOSE, CAPILLARY
Glucose-Capillary: 103 mg/dL — ABNORMAL HIGH (ref 70–99)
Glucose-Capillary: 66 mg/dL — ABNORMAL LOW (ref 70–99)
Glucose-Capillary: 71 mg/dL (ref 70–99)
Glucose-Capillary: 72 mg/dL (ref 70–99)
Glucose-Capillary: 97 mg/dL (ref 70–99)

## 2024-05-16 LAB — CBC
HCT: 31.2 % — ABNORMAL LOW (ref 39.0–52.0)
Hemoglobin: 10.3 g/dL — ABNORMAL LOW (ref 13.0–17.0)
MCH: 30.9 pg (ref 26.0–34.0)
MCHC: 33 g/dL (ref 30.0–36.0)
MCV: 93.7 fL (ref 80.0–100.0)
Platelets: 82 K/uL — ABNORMAL LOW (ref 150–400)
RBC: 3.33 MIL/uL — ABNORMAL LOW (ref 4.22–5.81)
RDW: 13.5 % (ref 11.5–15.5)
WBC: 4.8 K/uL (ref 4.0–10.5)
nRBC: 0 % (ref 0.0–0.2)

## 2024-05-16 LAB — VITAMIN B12: Vitamin B-12: 1456 pg/mL — ABNORMAL HIGH (ref 180–914)

## 2024-05-16 MED ORDER — ALTEPLASE 2 MG IJ SOLR: 2.0000 mg | Freq: Once | INTRAMUSCULAR | Status: DC | PRN

## 2024-05-16 MED ORDER — LIDOCAINE-PRILOCAINE 2.5-2.5 % EX CREA: 1.0000 | TOPICAL_CREAM | CUTANEOUS | Status: DC | PRN

## 2024-05-16 MED ORDER — NEPRO/CARBSTEADY PO LIQD: 237.0000 mL | ORAL | Status: DC | PRN

## 2024-05-16 MED ORDER — LIDOCAINE HCL (PF) 1 % IJ SOLN: 5.0000 mL | INTRAMUSCULAR | Status: DC | PRN

## 2024-05-16 MED ORDER — ANTICOAGULANT SODIUM CITRATE 4% (200MG/5ML) IV SOLN: 5.0000 mL | Status: DC | PRN

## 2024-05-16 MED ORDER — MEMANTINE HCL 10 MG PO TABS
5.0000 mg | ORAL_TABLET | Freq: Two times a day (BID) | ORAL | Status: DC
  Administered 2024-05-16 – 2024-05-20 (×8): 5 mg via ORAL
  Filled 2024-05-16 (×8): qty 1

## 2024-05-16 MED ORDER — PENTAFLUOROPROP-TETRAFLUOROETH EX AERO: 1.0000 | INHALATION_SPRAY | CUTANEOUS | Status: DC | PRN

## 2024-05-16 MED ORDER — HEPARIN SODIUM (PORCINE) 1000 UNIT/ML DIALYSIS: 1000.0000 [IU] | INTRAMUSCULAR | Status: DC | PRN

## 2024-05-16 NOTE — Progress Notes (Signed)
 Not available for EEG at the moment will try back later as schedule allows.

## 2024-05-16 NOTE — Progress Notes (Signed)
 SLP Cancellation Note  Patient Details Name: Gregory Hood MRN: [id number] DOB: February 16, 1951   Cancelled treatment:       Reason Eval/Treat Not Completed: Patient at procedure or test/unavailable. At HD.  Norleen IVAR Blase, MA, CCC-SLP Speech Therapy

## 2024-05-16 NOTE — Plan of Care (Signed)

## 2024-05-16 NOTE — Progress Notes (Signed)
 Received patient in bed to unit.  Alert and oriented.  Informed consent signed and in chart.   TX duration:3.5 hours  Patient tolerated well.  Transported back to the room  Alert, without acute distress.  Hand-off given to patient's nurse.   Access used: Left Upper arm fistula Access issues: none  Total UF removed: 1L   05/16/24 1716  Vitals  Temp (!) 96.9 F (36.1 C)  Temp Source Axillary  BP (!) 126/58  Pulse Rate 78  Resp 15  Weight 78.1 kg  Type of Weight Post-Dialysis  Oxygen Therapy  SpO2 98 %  O2 Device Room Air  During Treatment Monitoring  Duration of HD Treatment -hour(s) 3.5 hour(s)  HD Safety Checks Performed Yes  Intra-Hemodialysis Comments Tx completed  Post Treatment  Dialyzer Clearance Clear  Liters Processed 84  Fluid Removed (mL) 1000 mL  Tolerated HD Treatment Yes  Post-Hemodialysis Comments Patient likes to move his arm around setting machine off.  AVG/AVF Arterial Site Held (minutes) 7 minutes  AVG/AVF Venous Site Held (minutes) 7 minutes  Fistula / Graft Left Upper arm  Placement Date/Time: 03/22/18 0627   Placed prior to admission: Yes  Orientation: Left  Access Location: Upper arm  Site Condition No complications  Fistula / Graft Assessment Present;Thrill;Bruit  Status Patent;Deaccessed  Drainage Description None     Camellia Brasil LPN [facility]

## 2024-05-16 NOTE — Progress Notes (Signed)
 HD#1 SUBJECTIVE:  Patient Summary: Gregory Hood is a 73 y.o. male with ESRD on HD, multiple prior strokes with vascular dementia, HFpEF, and AF s/p pacemaker who presented with AMS and weakness, admitted for evaluation and management of acute encephalopathy on hospital day 2.   Overnight Events: NAEON  Interim History: Patient was resting in bed and not in acute distress. was somnolent and unable to communicate or follow commands. He did not respond to orientation questions limited verbal output; According to his wife, he was not communicative but was able to eat and drink. He was unable to sleep last night, with frequent awakenings and repeatedly calling his son's and wife's names. Overall, his condition appears mostly unchanged from yesterday, per his wife.  Vital Signs: Vitals:   05/16/24 0321 05/16/24 0400 05/16/24 0437 05/16/24 0740  BP:   126/76 111/72  Pulse:   70 73  Resp: 16 16 20 18   Temp:   (!) 97.4 F (36.3 C) 97.6 F (36.4 C)  TempSrc:      SpO2:   100% 100%  Weight:    79.6 kg  Height:    5' 9 (1.753 m)   Supplemental O2: Room Air SpO2: 100 %  Filed Weights   05/16/24 0740  Weight: 79.6 kg    No intake or output data in the 24 hours ending 05/16/24 1033 Net IO Since Admission: -900 mL [05/16/24 1033]  Physical Exam: Physical Exam Constitutional: Cachectic, sleeping in bed, in no acute distress. Oriented to self but not to his wife; more delirious compared to yesterday. Does not follow commands. HENT: Normocephalic and atraumatic; mucous membranes moist. Eyes: Left eye blind with opaque pupil; right pupil constricted; conjunctivae non-erythematous. Neck: Supple; no meningismus. Cardiovascular: Regular rate and rhythm; no murmurs, rubs, or gallops. Pulmonary/Chest: Normal work of breathing on room air; lungs clear to auscultation bilaterally. Abdomen: Soft, non-tender, non-distended; no guarding or rebound. MSK: Poor muscle bulk, particularly in the lower  extremities; chronic generalized weakness; normal tone in upper Ext. Neurological:Patient was somnolent and unable to communicate or follow commands. He did not respond to orientation questions limited verbal output; does not follow commands; moves all extremities spontaneously; no new focal deficits appreciated. Skin: Warm and dry; no rashes or lesions. Psych: Flat affect; limited engagement.   Patient Lines/Drains/Airways Status     Active Line/Drains/Airways     Name Placement date Placement time Site Days   Peripheral IV 05/15/24 22 G 2.5 Anterior;Right Forearm 05/15/24  0631  Forearm  1   Fistula / Graft Left Upper arm Arteriovenous fistula 03/06/15  1150  Upper arm  3359   Fistula / Graft Left Upper arm 03/22/18  0627  Upper arm  2247            Pertinent labs and imaging:      Latest Ref Rng & Units 05/16/2024    2:05 AM 05/15/2024   12:36 PM 05/14/2024    9:30 AM  CBC  WBC 4.0 - 10.5 K/uL 4.8  3.8  4.3   Hemoglobin 13.0 - 17.0 g/dL 89.6  89.4  89.8   Hematocrit 39.0 - 52.0 % 31.2  31.9  31.1   Platelets 150 - 400 K/uL 82  87  88        Latest Ref Rng & Units 05/16/2024    2:05 AM 05/15/2024   12:36 PM 05/14/2024    9:30 AM  CMP  Glucose 70 - 99 mg/dL 88  84  63  BUN 8 - 23 mg/dL 46  43  69   Creatinine 0.61 - 1.24 mg/dL 0.87  1.54  88.71   Sodium 135 - 145 mmol/L 138  136  140   Potassium 3.5 - 5.1 mmol/L 4.9  4.4  5.2   Chloride 98 - 111 mmol/L 95  93  96   CO2 22 - 32 mmol/L 24  26  27    Calcium  8.9 - 10.3 mg/dL 9.3  9.3  9.2   Total Protein 6.5 - 8.1 g/dL   7.0   Total Bilirubin 0.0 - 1.2 mg/dL   1.0   Alkaline Phos 38 - 126 U/L   65   AST 15 - 41 U/L   20   ALT 0 - 44 U/L   11     No results found.  ASSESSMENT/PLAN:  Assessment: Principal Problem:   Altered mental status Active Problems:   Essential hypertension, benign   DM (diabetes mellitus), type 2 with renal complications (HCC)   End stage renal disease (HCC)   BPH (benign prostatic  hyperplasia)   HFrEF (heart failure with reduced ejection fraction) (HCC)   SSS (sick sinus syndrome) (HCC)   Polypharmacy   Hypoglycemia   Vascular dementia (HCC)   ESRD on dialysis (HCC)   AMS (altered mental status)   Plan: Gregory Hood is a 73 y.o. male with ESRD on HD, multiple prior strokes with vascular dementia, HFpEF, and AF s/p pacemaker who presented with acute encephalopathy and weakness, admitted for evaluation and management of metabolic encephalopathy on hospital day 1.   # Acute Encephalopathy Subacute mental status decline over the past 2-3 days per wife, with the patient now somnolent and minimally verbal. Etiology is likely multifactorial, including missed dialysis leading to uremia, recent increase in tramadol  use, hypoglycemia noted in the ED, and possible early infection. CT head was negative, and no focal neurological deficits were observed.  Today; pt was somnolent and unable to communicate or follow commands. He did not respond to orientation questions limited verbal output;He was unable to sleep last night, with frequent awakenings and repeatedly calling his son's and wife's names. Overall, his condition appears mostly unchanged from yesterday, per his wife, he did not recognize his wife and was unable to follow simple commands (e.g., opening his eyes), which he had been able to do previously.   His inattention and disorientation are likely multifactorial, potentially related to delirium, uremia, underlying vascular dementia, and chronic cerebral infarcts.  Plan: - EEG - Check Vit B 12 level - HD MWF - Hold tramadol  and other sedating meds like trazodone  - Neuro checks q4h - Trend glucose AC/HS - Follow cultures; start antibiotics only if fever/WBC rise or respiratory decline - Monitor mental status  - Consider MRI if no improvement  - Depakote level below 10  # ESRD on Hemodialysis (MWF) Missed Friday HD due to concern for fistula infiltration and wife  declining treatment; now with elevated BUN/Cr and mild hyperkalemia. Plan: - HD today per nephrology - BMP in AM - strict I/O; daily weights - Avoid nephrotoxic meds   #polypharmacy - Discontinue losartan/HCTZ as the patient is anuric; start losartan 100 mg daily. - Discontinue finasteride  (patient is not producing urine). - Discontinue multivitamin, vitamin C, folic acid , and red yeast rice. - Hold tramadol  and trazodone due to altered mental status. - Decrease Namenda  to 5mg  BID from 10 mg Notably, the patient has not been taking Eliquis or carvedilol .  # Possible LLL Pneumonia  vs Atelectasis CXR with basilar opacities and possible small L pleural effusion; afebrile and no leukocytosis here; SNF denies respiratory symptoms. Resp viral panel: negative, cough has not been getting worse. Plan: - Monitor symptoms and vitals - Consider starting empiric ceftriaxone  and azithromycin  only if clinical decline - Incentive spirometry  # Hypoglycemia Improved.  Plan: - Hold Correctional Insulin  - AC/HS glucose checks - Ensure adequate PO intake - Review meds for hypoglycemia risk (none significant)  # Persistent Atrial Fibrillation s/p Pacemaker # Atrial flutter vs tachycardia Hx (12/20/23) V-paced rhythm on EKG; rate controlled. Plan: - Continue amiodarone  - Continue aspirin  + clopidogrel  - DC Telemetry monitoring  #Chronic HFpEF Patient is euvolemic.  Plan: - HD as above - DC losartan -HCTZ  - Continue  spironolactone  and losartan  100 - Monitor respiratory status, daily weights - Fluid restriction (2 L)  # Multiple Prior CVA / Vascular Dementia Lower neurologic reserve; worsened by metabolic derangements. Plan: - Continue memantine  - Address reversible contributors (uremia, pain meds, hypoglycemia)  # Chronic Pain Tramadol  recently increased at SNF; high risk in ESRD. Plan: - HOLD tramadol  - Scheduled Tylenol  PRN - Lidocaine  patch for back pain - Avoid opioids unless  severe breakthrough  # Constipation At baseline; on stool softener. Plan: Continue stool regimen Monitor BM frequency  # Chronic Anemia of CKD Hgb 10.3, stable. Plan: Continue iron  Monitor CBC  # Thrombocytopenia Platelets 82; chronic. Plan: - Monitor CBC - Continue heparin  for DVT ppx unless bleeding     Best practice: Diet: Normal VTE: Heparin  IVF: None,None Code: Full   Disposition planning: Prior to Admission Living Arrangement: SNF, [place] Anticipated Discharge Location: SNF   Daryan Buell [facility]  10:33 AM, 05/16/2024  On Call pager [phone]

## 2024-05-16 NOTE — Progress Notes (Signed)
  Progress Note   Date: 05/16/2024  Patient Name: Gregory Hood        MRN#: [id number]  Clarification of the type of atrial fibrillation:  Other explanation of clinical findings (please specify)  Patient has atrial tachycardia vs atrial flutter

## 2024-05-16 NOTE — Progress Notes (Signed)
  Progress Note   Date: 05/16/2024  Patient Name: Gregory Hood        MRN#: [id number]  Clarification of diagnosis:  Chronic HFpEF

## 2024-05-16 NOTE — Progress Notes (Signed)
 [facility] Progress Note   Subjective:  Seen in room. Wife at bedside. No new events overnight. He remains minimally responsive. Will just say his name.  Dialysis today  Objective Vitals:   05/16/24 0321 05/16/24 0400 05/16/24 0437 05/16/24 0740  BP:   126/76 111/72  Pulse:   70 73  Resp: 16 16 20 18   Temp:   (!) 97.4 F (36.3 C) 97.6 F (36.4 C)  TempSrc:      SpO2:   100% 100%  Weight:    79.6 kg  Height:    5' 9 (1.753 m)    Additional Objective Labs: Basic Metabolic Panel: Recent Labs  Lab 05/14/24 0930 05/14/24 1700 05/15/24 1236 05/16/24 0205  NA 140  --  136 138  K 5.2*  --  4.4 4.9  CL 96*  --  93* 95*  CO2 27  --  26 24  GLUCOSE 63*  --  84 88  BUN 69*  --  43* 46*  CREATININE 11.28*  --  8.45* 9.12*  CALCIUM  9.2  --  9.3 9.3  PHOS  --  6.1* 4.6 4.9*   CBC: Recent Labs  Lab 05/14/24 0930 05/15/24 1236 05/16/24 0205  WBC 4.3 3.8* 4.8  HGB 10.1* 10.5* 10.3*  HCT 31.1* 31.9* 31.2*  MCV 95.1 93.5 93.7  PLT 88* 87* 82*   Blood Culture    Component Value Date/Time   SDES BLOOD RIGHT ARM 05/15/2024 1236   SPECREQUEST  05/15/2024 1236    BOTTLES DRAWN AEROBIC ONLY Blood Culture adequate volume   CULT  05/15/2024 1236    NO GROWTH < 24 HOURS Performed at [facility], [address], [place], [place] [address]    REPTSTATUS PENDING 05/15/2024 1236     Physical Exam General: Chronically ill appearing, nad Heart: RRR Lungs: Clear, normal wob Abdomen: non tender Extremities: no sig LE edema  Dialysis Access: L arm AVF +bruit   Medications:   acetaminophen   1,000 mg Oral Q6H   Or   acetaminophen   650 mg Rectal Q6H   amiodarone   200 mg Oral Daily   aspirin  EC  81 mg Oral QHS   calcium  acetate  667 mg Oral TID with meals   Chlorhexidine  Gluconate Cloth  6 each Topical Q0600   clopidogrel   75 mg Oral Daily   diclofenac  Sodium  4 g Topical QID   divalproex   125 mg Oral Q1200   heparin   5,000 Units Subcutaneous Q8H    insulin  aspart  0-6 Units Subcutaneous TID WC   lidocaine   2 patch Transdermal Q24H   losartan   100 mg Oral QHS   melatonin  3 mg Oral QHS   memantine   10 mg Oral BID   spironolactone   50 mg Oral Daily    Dialysis Orders:  MWF [place] 3h  B450  79.8kg   AVG    Heparin  none Last OP HD 11/14 Friday, post wt 80.0kg, only stayed on 1 min Signing off sig early the past 1-2 wks, 2- 3hrs  Last Hb 11.3 on 11/12 Last mircera 30mg  on 10/13  Assessment/Plan: Acute encephalopathy. Multifactorial. No acute findings on Head CT.  Concern for polypharmacy. Sedating meds held. Missed dialysis prior to admit. Follow.  ESRD. HD MWF.  Continue on schedule. Next HD Wed.  HTN. BP acceptable.  Volume. Appears euvolemic  Anemia. Hgb 10-12. No ESA needs currently. Follow.  2HPTH. Ca/Phos ok. Follow trends.  Dementia SNF dependent: per family member  [name] PA-C [facility] 05/16/2024,9:29 AM

## 2024-05-17 ENCOUNTER — Inpatient Hospital Stay (HOSPITAL_COMMUNITY)

## 2024-05-17 ENCOUNTER — Other Ambulatory Visit: Payer: Self-pay

## 2024-05-17 DIAGNOSIS — G9340 Encephalopathy, unspecified: Secondary | ICD-10-CM | POA: Diagnosis not present

## 2024-05-17 DIAGNOSIS — Z950 Presence of cardiac pacemaker: Secondary | ICD-10-CM | POA: Diagnosis not present

## 2024-05-17 DIAGNOSIS — N186 End stage renal disease: Secondary | ICD-10-CM | POA: Diagnosis not present

## 2024-05-17 DIAGNOSIS — Z992 Dependence on renal dialysis: Secondary | ICD-10-CM | POA: Diagnosis not present

## 2024-05-17 LAB — GLUCOSE, CAPILLARY
Glucose-Capillary: 79 mg/dL (ref 70–99)
Glucose-Capillary: 64 mg/dL — ABNORMAL LOW (ref 70–99)
Glucose-Capillary: 114 mg/dL — ABNORMAL HIGH (ref 70–99)
Glucose-Capillary: 115 mg/dL — ABNORMAL HIGH (ref 70–99)

## 2024-05-17 LAB — HEPATITIS B SURFACE ANTIBODY, QUANTITATIVE: Hep B S AB Quant (Post): 34.3 m[IU]/mL

## 2024-05-17 MED ORDER — DEXTROSE 5 % IV SOLN: INTRAVENOUS | Status: AC

## 2024-05-17 MED ORDER — GUAIFENESIN 200 MG PO TABS: 200.0000 mg | ORAL_TABLET | ORAL | Status: DC | PRN

## 2024-05-17 MED ORDER — GUAIFENESIN 200 MG PO TABS
200.0000 mg | ORAL_TABLET | Freq: Four times a day (QID) | ORAL | Status: DC
  Administered 2024-05-17 – 2024-05-20 (×12): 200 mg via ORAL
  Filled 2024-05-17 (×17): qty 1

## 2024-05-17 MED ORDER — AZITHROMYCIN 500 MG PO TABS
500.0000 mg | ORAL_TABLET | Freq: Every day | ORAL | Status: AC
Start: 2024-05-17 — End: 2024-05-19
  Administered 2024-05-17 – 2024-05-19 (×3): 500 mg via ORAL
  Filled 2024-05-17 (×3): qty 1

## 2024-05-17 MED ORDER — SODIUM CHLORIDE 0.9 % IV SOLN
1.0000 g | INTRAVENOUS | Status: AC
  Administered 2024-05-17 – 2024-05-19 (×2): 1 g via INTRAVENOUS
  Filled 2024-05-17 (×2): qty 10

## 2024-05-17 NOTE — TOC Initial Note (Signed)
 Transition of Care [facility]) - Initial/Assessment Note    Patient Details  Name: Gregory Hood MRN: [id number] Date of Birth: [birth year]  [facility] CM/SW Contact:    Sherline Clack, LCSWA Phone Number: 05/17/2024, 4:40 PM  Clinical Narrative:                  CSW spoke with patient's wife at bedside regarding discharge plan. Patient has been at [facility] since [facility] and patient's wife would like him to return. CSW also spoke with the front desk person at [facility] via phone call about patient's return and was told patient is able to return whenever he is medically ready. Provider made aware. Anticipated discharge plan is to return to [facility].   Expected Discharge Plan: Long Term Nursing Home Barriers to Discharge: Continued Medical Work up   Patient Goals and CMS Choice            Expected Discharge Plan and Services       Living arrangements for the past 2 months: Assisted Living Facility                                      Prior Living Arrangements/Services Living arrangements for the past 2 months: Assisted Living Facility Lives with:: Facility Resident                   Activities of Daily Living   ADL Screening (condition at time of admission) Independently performs ADLs?: No Does the patient have a NEW difficulty with bathing/dressing/toileting/self-feeding that is expected to last >3 days?: Yes (Initiates electronic notice to provider for possible OT consult) Does the patient have a NEW difficulty with getting in/out of bed, walking, or climbing stairs that is expected to last >3 days?: Yes (Initiates electronic notice to provider for possible PT consult) Does the patient have a NEW difficulty with communication that is expected to last >3 days?: Yes (Initiates electronic notice to provider for possible SLP consult) Is the patient deaf or have difficulty hearing?: No Does the patient have difficulty seeing,  even when wearing glasses/contacts?: Yes Does the patient have difficulty concentrating, remembering, or making decisions?: Yes  Permission Sought/Granted                  Emotional Assessment Appearance:: Appears stated age Attitude/Demeanor/Rapport: Unable to Assess Affect (typically observed): Unable to Assess Orientation: : Oriented to Self      Admission diagnosis:  Anasarca [R60.1] Altered mental status [R41.82] Hypoglycemia [E16.2] Acute encephalopathy [G93.40] AMS (altered mental status) [R41.82] Patient Active Problem List   Diagnosis Date Noted   Hypoglycemia 05/15/2024   Vascular dementia (HCC) 05/15/2024   ESRD on dialysis (HCC) 05/15/2024   AMS (altered mental status) 05/15/2024   Altered mental status 05/14/2024   HFrEF (heart failure with reduced ejection fraction) (HCC) 05/14/2024   SSS (sick sinus syndrome) (HCC) 05/14/2024   Polypharmacy 05/14/2024   Cholecystitis 11/11/2023   Wide-complex tachycardia 10/08/2020   Syncope 10/08/2020   History of completed stroke 07/11/2018   Acute encephalopathy 07/11/2018   Cellulitis of right leg 07/11/2018   Hypertensive urgency 07/11/2018   Acute exacerbation of CHF (congestive heart failure) (HCC) 03/22/2018   CVA (cerebral vascular accident) (HCC) 03/22/2018   OSA (obstructive sleep apnea) 03/22/2018   BPH (benign prostatic hyperplasia) 03/22/2018   Thrombocytopenia 03/22/2018   Normocytic anemia 03/22/2018   Metabolic  acidosis 03/22/2018   Uremia 03/22/2018   End stage renal disease (HCC) 03/14/2014   Orthostatic hypotension 02/22/2014   Elevated troponin 02/22/2014   Chronic diastolic heart failure (HCC) 02/22/2014   Elevated lipids 02/22/2014   Essential hypertension, benign 02/17/2014   DM (diabetes mellitus), 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   Abnormal ECG 02/17/2014   PCP:  Valma Carwin, MD Pharmacy:   [id number] - [place], [place] - [address]  [address] [place] [address] Phone: [phone] Fax: [phone]  [name]) - [place], [place] - [address] [facility] [address] [place] [address] [place] [address] Phone: [phone] Fax: [phone]  [facility] [address] [place] [address] Phone: [phone] Fax: [phone]     Social Drivers of Health (SDOH) Social History: SDOH Screenings   Food Insecurity: Patient Unable To Answer (05/15/2024)  Housing: Low Risk  (05/15/2024)  Transportation Needs: No Transportation Needs (05/15/2024)  Utilities: Not At Risk (05/15/2024)  Social Connections: Unknown (05/15/2024)  Tobacco Use: Medium Risk (05/14/2024)   SDOH Interventions:     Readmission Risk Interventions     No data to display

## 2024-05-17 NOTE — Progress Notes (Signed)
 Patient and wife refused labs this morning. Per lab tech she wanted to see the provider first.

## 2024-05-17 NOTE — Procedures (Signed)
 Patient Name: TAARIQ LEITZ  MRN: [id number]  Epilepsy Attending: Pastor Falling  Referring Physician/Provider: No ref. provider found      Date: 05/17/2024 Duration:  24 minutes   Patient history: 73 year old man with altered mental status. EEG to rule out seizures   Level of alertness: Drowsy,   AEDs during EEG study: None   Technical aspects: This EEG study was done with scalp electrodes positioned according to the 10-20 International system of electrode placement. Electrical activity was reviewed with band pass filter of 1-70Hz , sensitivity of 7 uV/mm, display speed of 14mm/sec with a 60Hz  notched filter applied as appropriate. EEG data were recorded continuously and digitally stored.  Video monitoring was available and reviewed as appropriate.  Description: EEG showed continuous generalized delta slowing. Hyperventilation and photic stimulation were not performed.     ABNORMALITY - Continuous slow, generalized  IMPRESSION: This study is suggestive of moderate to severe diffuse encephalopathy, nonspecific etiology but likely related to sedation, toxic-metabolic etiology.  No seizures or epileptiform discharges were seen throughout the recording.   Pastor Falling MD Neurology

## 2024-05-17 NOTE — Progress Notes (Signed)
 [name] Progress Note   Subjective:  Completed dialysis yesterday. No issues reported.  Seen in room with wife at bedside BP low this am, am meds held. No much change in mental status.   Objective Vitals:   05/16/24 2124 05/17/24 0125 05/17/24 0429 05/17/24 0759  BP: 134/70 116/67 128/76 (!) 103/35  Pulse: 69 72 80 70  Resp:    19  Temp: 98.8 F (37.1 C) (!) 97.4 F (36.3 C) 98 F (36.7 C) 98.4 F (36.9 C)  TempSrc:    Oral  SpO2: 94% 100% 100% 100%  Weight:      Height:        Additional Objective Labs: Basic Metabolic Panel: Recent Labs  Lab 05/14/24 0930 05/14/24 1700 05/15/24 1236 05/16/24 0205  NA 140  --  136 138  K 5.2*  --  4.4 4.9  CL 96*  --  93* 95*  CO2 27  --  26 24  GLUCOSE 63*  --  84 88  BUN 69*  --  43* 46*  CREATININE 11.28*  --  8.45* 9.12*  CALCIUM  9.2  --  9.3 9.3  PHOS  --  6.1* 4.6 4.9*   CBC: Recent Labs  Lab 05/14/24 0930 05/15/24 1236 05/16/24 0205  WBC 4.3 3.8* 4.8  HGB 10.1* 10.5* 10.3*  HCT 31.1* 31.9* 31.2*  MCV 95.1 93.5 93.7  PLT 88* 87* 82*   Blood Culture    Component Value Date/Time   SDES BLOOD RIGHT ARM 05/15/2024 1236   SPECREQUEST  05/15/2024 1236    BOTTLES DRAWN AEROBIC ONLY Blood Culture adequate volume   CULT  05/15/2024 1236    NO GROWTH 2 DAYS Performed at [facility], [address] [place], [place] [address]    REPTSTATUS PENDING 05/15/2024 1236     Physical Exam General: Chronically ill appearing, nad Heart: RRR Lungs: Clear, normal wob Abdomen: non tender Extremities: no sig LE edema  Dialysis Access: L arm AVF +bruit   Medications:   acetaminophen   1,000 mg Oral Q6H   Or   acetaminophen   650 mg Rectal Q6H   amiodarone   200 mg Oral Daily   aspirin  EC  81 mg Oral QHS   calcium  acetate  667 mg Oral TID with meals   Chlorhexidine  Gluconate Cloth  6 each Topical Q0600   clopidogrel   75 mg Oral Daily   diclofenac  Sodium  4 g Topical QID   divalproex   125 mg  Oral Q1200   heparin   5,000 Units Subcutaneous Q8H   lidocaine   2 patch Transdermal Q24H   losartan   100 mg Oral QHS   melatonin  3 mg Oral QHS   memantine   5 mg Oral BID   spironolactone   50 mg Oral Daily    Dialysis Orders:  MWF [place] 3h  B450  79.8kg   AVG    Heparin  none Last OP HD 11/14 Friday, post wt 80.0kg, only stayed on 1 min Signing off sig early the past 1-2 wks, 2- 3hrs  Last Hb 11.3 on 11/12 Last mircera 30mg  on 10/13  Assessment/Plan: Acute encephalopathy. Multifactorial. No acute findings on Head CT. Concern for polypharmacy. Sedating meds held. Missed dialysis prior to admit. Follow.  ESRD. HD MWF.  Continue on schedule. Next HD Friday.  HTN. BP acceptable. On losartan  100, spironolactone  50. Follow trends.  Volume. Appears euvolemic Minimal UF with HD  Anemia. Hgb 10-12. No ESA needs currently. Follow.  2HPTH. Ca/Phos ok. Follow  trends.  Dementia SNF resident   Gregory Hood Gregory Boggus PA-C [facility] 05/17/2024,8:46 AM

## 2024-05-17 NOTE — Plan of Care (Signed)

## 2024-05-17 NOTE — Progress Notes (Signed)
 HD#2 SUBJECTIVE:  Patient Summary: Gregory Hood is a 73 y.o. male with ESRD on HD, multiple prior strokes with vascular dementia, HFpEF, and AF s/p pacemaker who presented with AMS and weakness, admitted for evaluation and management of acute encephalopathy on hospital day 2.   Overnight Events: NAEON  Interim History: Patient was resting in bed without acute distress. Eyes were open; however, he remained inattentive, though he responded to our greeting ("Good morning"). He was oriented to self and his wife.  Per the wife, the patient did not sleep well overnight. He refused breakfast this morning but ate dinner last night. She also reported that his cough began a few days ago and was initially dry; since yesterday, she believes there is some mucus or discharge that he is unable to expectorate. Additionally, she noted that he experienced significant sweating last night, requiring staff to change all the bed sheets.  The wife expressed concern about possible pneumonia. We discussed the potential causes of his symptoms and reviewed the evaluation plan, which includes obtaining a chest X-ray to assess for any new changes, completing the EEG today, and considering an MRI if initial studies are unremarkable.   OBJECTIVE:  Vital Signs: Vitals:   05/17/24 0125 05/17/24 0429 05/17/24 0759 05/17/24 1001  BP: 116/67 128/76 (!) 103/35 130/65  Pulse: 72 80 70 72  Resp:   19   Temp: (!) 97.4 F (36.3 C) 98 F (36.7 C) 98.4 F (36.9 C)   TempSrc:   Oral   SpO2: 100% 100% 100%   Weight:      Height:       Supplemental O2: Room Air SpO2: 100 %  Filed Weights   05/16/24 1232 05/16/24 1326 05/16/24 1716  Weight: 79.1 kg 79.1 kg 78.1 kg     Intake/Output Summary (Last 24 hours) at 05/17/2024 1100 Last data filed at 05/16/2024 1716 Gross per 24 hour  Intake --  Output 1000 ml  Net -1000 ml   Net IO Since Admission: -1,900 mL [05/17/24 1100]  Physical Exam: Physical Exam Oriented to  self but not to his wife; more delirious compared to yesterday. Does not follow commands. HENT: Normocephalic and atraumatic; mucous membranes moist. Eyes: Left eye blind with opaque pupil; right pupil constricted; conjunctivae non-erythematous. Neck: Supple; no meningismus. Cardiovascular: Regular rate and rhythm; no murmurs, rubs, or gallops. Pulmonary/Chest: Normal work of breathing on room air; lungs clear to auscultation except mild rales in left lower side.. Abdomen: Soft, non-tender, non-distended; no guarding or rebound. MSK: Poor muscle bulk, particularly in the lower extremities; chronic generalized weakness; normal tone in upper Ext. Neurological:Patient was; moves all extremities spontaneously; no new focal deficits appreciated. Skin: Warm and dry; no rashes or lesions. Psych: Flat affect; limited engagement.  Patient Lines/Drains/Airways Status     Active Line/Drains/Airways     Name Placement date Placement time Site Days   Peripheral IV 05/15/24 22 G 2.5 Anterior;Right Forearm 05/15/24  0631  Forearm  2   Fistula / Graft Left Upper arm Arteriovenous fistula 03/06/15  1150  Upper arm  3360   Fistula / Graft Left Upper arm 03/22/18  0627  Upper arm  2248            Pertinent labs and imaging:      Latest Ref Rng & Units 05/16/2024    2:05 AM 05/15/2024   12:36 PM 05/14/2024    9:30 AM  CBC  WBC 4.0 - 10.5 K/uL 4.8  3.8  4.3  Hemoglobin 13.0 - 17.0 g/dL 89.6  89.4  89.8   Hematocrit 39.0 - 52.0 % 31.2  31.9  31.1   Platelets 150 - 400 K/uL 82  87  88        Latest Ref Rng & Units 05/16/2024    2:05 AM 05/15/2024   12:36 PM 05/14/2024    9:30 AM  CMP  Glucose 70 - 99 mg/dL 88  84  63   BUN 8 - 23 mg/dL 46  43  69   Creatinine 0.61 - 1.24 mg/dL 0.87  1.54  88.71   Sodium 135 - 145 mmol/L 138  136  140   Potassium 3.5 - 5.1 mmol/L 4.9  4.4  5.2   Chloride 98 - 111 mmol/L 95  93  96   CO2 22 - 32 mmol/L 24  26  27    Calcium  8.9 - 10.3 mg/dL 9.3  9.3  9.2    Total Protein 6.5 - 8.1 g/dL   7.0   Total Bilirubin 0.0 - 1.2 mg/dL   1.0   Alkaline Phos 38 - 126 U/L   65   AST 15 - 41 U/L   20   ALT 0 - 44 U/L   11     No results found.  ASSESSMENT/PLAN:  Assessment: Principal Problem:   Altered mental status Active Problems:   Essential hypertension, benign   DM (diabetes mellitus), type 2 with renal complications (HCC)   End stage renal disease (HCC)   BPH (benign prostatic hyperplasia)   HFrEF (heart failure with reduced ejection fraction) (HCC)   SSS (sick sinus syndrome) (HCC)   Polypharmacy   Hypoglycemia   Vascular dementia (HCC)   ESRD on dialysis (HCC)   AMS (altered mental status)   Plan: Gregory Hood is a 73 y.o. male with ESRD on HD, multiple prior strokes with vascular dementia, HFpEF, and AF s/p pacemaker who presented with acute encephalopathy and weakness, admitted for evaluation and management of metabolic encephalopathy on hospital day 1.   # Acute Encephalopathy Subacute mental status decline has been reported over the past four days by the patient's wife, with the patient now somnolent and minimally verbal. The etiology appears multifactorial. A CT head was unremarkable, and no focal neurological deficits were observed. Metabolic evaluation revealed no vitamin deficiencies or electrolyte abnormalities; TSH was normal, and there was no leukocytosis. Chest X-ray demonstrated a mild left-sided pleural effusion with findings suggestive of possible atelectasis or pneumonia, unchanged from imaging obtained several months ago. A respiratory viral panel was negative.Depakote level below 10.  Today: The patient was inattentive and sleepy, though he communicated better than yesterday. He was oriented to himself and his wife. His inattention and disorientation are likely multifactorial, potentially related to delirium, underlying vascular dementia, chronic cerebral infarcts, and possible lobar pneumonia.  Plan: - EEG - chest  xray - Consider MRI if no improvement  - HD MWF - Hold tramadol  and other sedating meds like trazodone  - Neuro checks q4h - Trend glucose AC/HS - Follow cultures; start antibiotics if chest xray showed changes - Monitor mental status    # ESRD on Hemodialysis (MWF) Patient was euvolemic, he does not make urine. Lab data is consistent with ESRD. Plan: - HD today per nephrology - Lab holiday today, BMP tomorrow morning - strict I/O; daily weights - Avoid nephrotoxic meds   #polypharmacy - Discontinue losartan/HCTZ as the patient is anuric; start losartan 100 mg daily. - Discontinue finasteride  (patient is  not producing urine). - Discontinue multivitamin, vitamin C, folic acid , and red yeast rice. - Hold tramadol  and trazodone due to altered mental status. - Decrease Namenda  to 5mg  BID from 10 mg Notably, the patient has not been taking Eliquis or carvedilol .  # Possible LLL Pneumonia vs Atelectasis CXR with basilar opacities and possible small L pleural effusion; afebrile and no leukocytosis here; SNF denies respiratory symptoms. Resp viral panel: negative, cough has not been getting worse. Plan: - Repeat chest x-ray - Start Mucinex  - Monitor symptoms and vitals - Consider starting empiric ceftriaxone  and azithromycin  only if clinical decline - Incentive spirometry  # Hypoglycemia Improved.  Plan: - Hold Correctional Insulin  - start Glucose gel - AC/HS glucose checks - Ensure adequate PO intake - Review meds for hypoglycemia risk (none significant)  # Persistent Atrial Fibrillation s/p Pacemaker # Atrial flutter vs tachycardia Hx (12/20/23) V-paced rhythm on EKG; rate controlled. Plan: - Continue amiodarone  - Continue aspirin  + clopidogrel  - DC Telemetry monitoring  #Chronic HFpEF Patient is euvolemic.  Plan: - HD as above - DC losartan -HCTZ  - Continue  spironolactone  and losartan  100 - Monitor respiratory status, daily weights - Fluid restriction (2 L)  #  Multiple Prior CVA / Vascular Dementia Lower neurologic reserve; worsened by metabolic derangements. Plan: - Continue memantine  - Address reversible contributors (uremia, pain meds, hypoglycemia)  # Chronic Pain Tramadol  recently increased at SNF; high risk in ESRD. Plan: - HOLD tramadol  - Scheduled Tylenol  PRN - Lidocaine  patch for back pain - Avoid opioids unless severe breakthrough  # Constipation At baseline; on stool softener. Plan: Continue stool regimen Monitor BM frequency  # Chronic Anemia of CKD Hgb 10.3, stable. Plan: Continue iron  Monitor CBC  # Thrombocytopenia Platelets 82; chronic. Plan: - Monitor CBC - Continue heparin  for DVT ppx unless bleeding     Best practice: Diet: Normal VTE: Heparin  IVF: None,None Code: Full   Disposition planning: Prior to Admission Living Arrangement: SNF, [place] Anticipated Discharge Location: SNF   Signature:  Hayla Hinger [facility]  11:00 AM, 05/17/2024  On Call pager [phone]

## 2024-05-17 NOTE — Progress Notes (Signed)
 EEG complete - results pending

## 2024-05-18 DIAGNOSIS — R918 Other nonspecific abnormal finding of lung field: Secondary | ICD-10-CM | POA: Diagnosis not present

## 2024-05-18 DIAGNOSIS — R059 Cough, unspecified: Secondary | ICD-10-CM

## 2024-05-18 DIAGNOSIS — Z792 Long term (current) use of antibiotics: Secondary | ICD-10-CM

## 2024-05-18 DIAGNOSIS — G9340 Encephalopathy, unspecified: Secondary | ICD-10-CM | POA: Diagnosis not present

## 2024-05-18 DIAGNOSIS — J189 Pneumonia, unspecified organism: Secondary | ICD-10-CM

## 2024-05-18 LAB — GLUCOSE, CAPILLARY
Glucose-Capillary: 116 mg/dL — ABNORMAL HIGH (ref 70–99)
Glucose-Capillary: 99 mg/dL (ref 70–99)
Glucose-Capillary: 148 mg/dL — ABNORMAL HIGH (ref 70–99)
Glucose-Capillary: 115 mg/dL — ABNORMAL HIGH (ref 70–99)
Glucose-Capillary: 78 mg/dL (ref 70–99)

## 2024-05-18 LAB — RENAL FUNCTION PANEL
Albumin: 3.4 g/dL — ABNORMAL LOW (ref 3.5–5.0)
Anion gap: 18 — ABNORMAL HIGH (ref 5–15)
BUN: 36 mg/dL — ABNORMAL HIGH (ref 8–23)
CO2: 24 mmol/L (ref 22–32)
Calcium: 9.3 mg/dL (ref 8.9–10.3)
Chloride: 93 mmol/L — ABNORMAL LOW (ref 98–111)
Creatinine, Ser: 8 mg/dL — ABNORMAL HIGH (ref 0.61–1.24)
GFR, Estimated: 7 mL/min — ABNORMAL LOW (ref >60)
Glucose, Bld: 100 mg/dL — ABNORMAL HIGH (ref 70–99)
Phosphorus: 4.4 mg/dL (ref 2.5–4.6)
Potassium: 4.7 mmol/L (ref 3.5–5.1)
Sodium: 135 mmol/L (ref 135–145)

## 2024-05-18 LAB — CBC
HCT: 34.5 % — ABNORMAL LOW (ref 39.0–52.0)
Hemoglobin: 11.2 g/dL — ABNORMAL LOW (ref 13.0–17.0)
MCH: 30.7 pg (ref 26.0–34.0)
MCHC: 32.5 g/dL (ref 30.0–36.0)
MCV: 94.5 fL (ref 80.0–100.0)
Platelets: 102 K/uL — ABNORMAL LOW (ref 150–400)
RBC: 3.65 MIL/uL — ABNORMAL LOW (ref 4.22–5.81)
RDW: 13.7 % (ref 11.5–15.5)
WBC: 5 K/uL (ref 4.0–10.5)
nRBC: 0 % (ref 0.0–0.2)

## 2024-05-18 MED ORDER — LIDOCAINE-PRILOCAINE 2.5-2.5 % EX CREA: 1.0000 | TOPICAL_CREAM | CUTANEOUS | Status: DC | PRN

## 2024-05-18 MED ORDER — PENTAFLUOROPROP-TETRAFLUOROETH EX AERO: 1.0000 | INHALATION_SPRAY | CUTANEOUS | Status: DC | PRN

## 2024-05-18 MED ORDER — ANTICOAGULANT SODIUM CITRATE 4% (200MG/5ML) IV SOLN: 5.0000 mL | Status: DC | PRN

## 2024-05-18 MED ORDER — ALTEPLASE 2 MG IJ SOLR: 2.0000 mg | Freq: Once | INTRAMUSCULAR | Status: DC | PRN

## 2024-05-18 MED ORDER — ANTICOAGULANT SODIUM CITRATE 4% (200MG/5ML) IV SOLN
5.0000 mL | Status: DC | PRN
  Filled 2024-05-18: qty 5

## 2024-05-18 MED ORDER — LIDOCAINE HCL (PF) 1 % IJ SOLN: 5.0000 mL | INTRAMUSCULAR | Status: DC | PRN

## 2024-05-18 MED ORDER — PENTAFLUOROPROP-TETRAFLUOROETH EX AERO
INHALATION_SPRAY | CUTANEOUS | Status: AC
  Filled 2024-05-18: qty 30

## 2024-05-18 MED ORDER — HEPARIN SODIUM (PORCINE) 1000 UNIT/ML DIALYSIS: 1000.0000 [IU] | INTRAMUSCULAR | Status: DC | PRN

## 2024-05-18 NOTE — Progress Notes (Signed)
 [facility] Progress Note   Subjective:  Seen in room. Seems to have perked up some today. Wife giving him breakfast, eating better  Dialysis today    Objective Vitals:   05/17/24 2115 05/18/24 0032 05/18/24 0510 05/18/24 0740  BP: 131/65 123/64 109/73 115/81  Pulse: 78 78 72 70  Resp:  18 19 18   Temp: 98 F (36.7 C) 99.2 F (37.3 C) (!) 97.5 F (36.4 C) (!) 97.3 F (36.3 C)  TempSrc:  Oral Oral   SpO2: 100% 100% 100% 100%  Weight:      Height:        Additional Objective Labs: Basic Metabolic Panel: Recent Labs  Lab 05/15/24 1236 05/16/24 0205 05/18/24 0559  NA 136 138 135  K 4.4 4.9 4.7  CL 93* 95* 93*  CO2 26 24 24   GLUCOSE 84 88 100*  BUN 43* 46* 36*  CREATININE 8.45* 9.12* 8.00*  CALCIUM  9.3 9.3 9.3  PHOS 4.6 4.9* 4.4   CBC: Recent Labs  Lab 05/14/24 0930 05/15/24 1236 05/16/24 0205 05/18/24 0559  WBC 4.3 3.8* 4.8 5.0  HGB 10.1* 10.5* 10.3* 11.2*  HCT 31.1* 31.9* 31.2* 34.5*  MCV 95.1 93.5 93.7 94.5  PLT 88* 87* 82* 102*   Blood Culture    Component Value Date/Time   SDES BLOOD RIGHT ARM 05/15/2024 1236   SPECREQUEST  05/15/2024 1236    BOTTLES DRAWN AEROBIC ONLY Blood Culture adequate volume   CULT  05/15/2024 1236    NO GROWTH 3 DAYS Performed at [facility], [address] [place], [place] [address]    REPTSTATUS PENDING 05/15/2024 1236     Physical Exam General: Chronically ill appearing, nad Heart: RRR Lungs: Clear, normal wob Abdomen: non tender Extremities: no sig LE edema  Dialysis Access: L arm AVF +bruit   Medications:  cefTRIAXone  (ROCEPHIN )  IV Stopped (05/17/24 1523)    acetaminophen   1,000 mg Oral Q6H   Or   acetaminophen   650 mg Rectal Q6H   amiodarone   200 mg Oral Daily   aspirin  EC  81 mg Oral QHS   azithromycin   500 mg Oral Daily   calcium  acetate  667 mg Oral TID with meals   Chlorhexidine  Gluconate Cloth  6 each Topical Q0600   clopidogrel   75 mg Oral Daily   diclofenac  Sodium  4  g Topical QID   divalproex   125 mg Oral Q1200   guaiFENesin   200 mg Oral Q6H   heparin   5,000 Units Subcutaneous Q8H   lidocaine   2 patch Transdermal Q24H   losartan   100 mg Oral QHS   melatonin  3 mg Oral QHS   memantine   5 mg Oral BID   spironolactone   50 mg Oral Daily    Dialysis Orders:  MWF [place] 3h  B450  79.8kg   AVG    Heparin  none Last OP HD 11/14 Friday, post wt 80.0kg, only stayed on 1 min Signing off sig early the past 1-2 wks, 2- 3hrs  Last Hb 11.3 on 11/12 Last mircera 30mg  on 10/13  Assessment/Plan: Acute encephalopathy. Multifactorial. No acute findings on Head CT. Concern for polypharmacy. Sedating meds held. Missed dialysis prior to admit. Follow.  ESRD. HD MWF.  Continue on schedule. Next HD Friday.  HTN. BP acceptable. On losartan  100, spironolactone  50. Follow trends.  Volume. Appears euvolemic Minimal UF with HD  Anemia. Hgb 10-12. No ESA needs currently. Follow.  2HPTH. Ca/Phos ok. Follow trends.  Dementia at  baseline  SNF resident   Gregory Ronnald Acosta PA-C [facility] 05/18/2024,9:25 AM

## 2024-05-18 NOTE — Progress Notes (Signed)
 HD#3 SUBJECTIVE:  Patient Summary: Gregory Hood is a 73 y.o. male with ESRD on HD, multiple prior strokes with vascular dementia, HFpEF, and AF s/p pacemaker who presented with AMS and weakness, admitted for evaluation and management of acute encephalopathy on hospital day 3.   Overnight Events: NAEON  Interim History: The patient is oriented to self and his wife, more alert today, and communicates in short, simple phrases. His eyes were open, and his wife noted that he is eating better.  OBJECTIVE:  Vital Signs: Vitals:   05/17/24 1627 05/17/24 2115 05/18/24 0032 05/18/24 0510  BP: 139/76 131/65 123/64 109/73  Pulse: 71 78 78 72  Resp: 19  18 19   Temp: 98.9 F (37.2 C) 98 F (36.7 C) 99.2 F (37.3 C) (!) 97.5 F (36.4 C)  TempSrc: Oral  Oral Oral  SpO2: 100% 100% 100% 100%  Weight:      Height:       Supplemental O2: Room Air SpO2: 100 %  Filed Weights   05/16/24 1232 05/16/24 1326 05/16/24 1716  Weight: 79.1 kg 79.1 kg 78.1 kg     Intake/Output Summary (Last 24 hours) at 05/18/2024 0720 Last data filed at 05/17/2024 1758 Gross per 24 hour  Intake 152.9 ml  Output --  Net 152.9 ml   Net IO Since Admission: -1,747.1 mL [05/18/24 0720]  Physical Exam: Physical Exam HENT: Normocephalic and atraumatic; mucous membranes moist. Eyes: Left eye blind with opaque pupil; right pupil constricted; conjunctivae non-erythematous. Neck: Supple; no meningismus. Cardiovascular: Regular rate and rhythm; no murmurs, rubs, or gallops. Pulmonary/Chest: Normal work of breathing on room air; lungs clear to auscultation except mild rales in left lower side.. Abdomen: Soft, non-tender, non-distended; no guarding or rebound. MSK: Poor muscle bulk, particularly in the lower extremities; chronic generalized weakness; normal tone in upper Ext. Neurological:Patient was; moves all extremities spontaneously; no new focal deficits appreciated. Skin: Warm and dry; no rashes or  lesions. Psych: Flat affect; limited engagement.  The patient is oriented to self and his wife, more alert today, and communicates in short, simple phrases.   Patient Lines/Drains/Airways Status     Active Line/Drains/Airways     Name Placement date Placement time Site Days   Peripheral IV 05/15/24 22 G 2.5 Anterior;Right Forearm 05/15/24  0631  Forearm  3   Fistula / Graft Left Upper arm Arteriovenous fistula 03/06/15  1150  Upper arm  3361   Fistula / Graft Left Upper arm 03/22/18  0627  Upper arm  2249            Pertinent labs and imaging:      Latest Ref Rng & Units 05/18/2024    5:59 AM 05/16/2024    2:05 AM 05/15/2024   12:36 PM  CBC  WBC 4.0 - 10.5 K/uL 5.0  4.8  3.8   Hemoglobin 13.0 - 17.0 g/dL 88.7  89.6  89.4   Hematocrit 39.0 - 52.0 % 34.5  31.2  31.9   Platelets 150 - 400 K/uL 102  82  87        Latest Ref Rng & Units 05/16/2024    2:05 AM 05/15/2024   12:36 PM 05/14/2024    9:30 AM  CMP  Glucose 70 - 99 mg/dL 88  84  63   BUN 8 - 23 mg/dL 46  43  69   Creatinine 0.61 - 1.24 mg/dL 0.87  1.54  88.71   Sodium 135 - 145 mmol/L 138  136  140   Potassium 3.5 - 5.1 mmol/L 4.9  4.4  5.2   Chloride 98 - 111 mmol/L 95  93  96   CO2 22 - 32 mmol/L 24  26  27    Calcium  8.9 - 10.3 mg/dL 9.3  9.3  9.2   Total Protein 6.5 - 8.1 g/dL   7.0   Total Bilirubin 0.0 - 1.2 mg/dL   1.0   Alkaline Phos 38 - 126 U/L   65   AST 15 - 41 U/L   20   ALT 0 - 44 U/L   11     DG Chest 2 View Result Date: 05/17/2024 EXAM: 2 VIEW(S) XRAY OF THE CHEST 05/17/2024 01:50:00 PM COMPARISON: 05/14/2024 CLINICAL HISTORY: Pneumonia FINDINGS: LINES, TUBES AND DEVICES: Right chest pacemaker with a single lead in the right atrium and 2 leads overlying the right ventricle. LUNGS AND PLEURA: Possible small left pleural effusion. Left basilar pulmonary opacity. No pneumothorax. HEART AND MEDIASTINUM: Cardiomegaly. Atherosclerotic calcifications. BONES AND SOFT TISSUES: No acute osseous  abnormality. IMPRESSION: 1. Left basilar pulmonary opacity with possible small left pleural effusion, compatible with pneumonia. Electronically signed by: Morgane Naveau MD 05/17/2024 05:54 PM EST RP Workstation: [id number]   EEG adult Result Date: 05/17/2024 Gregg Lek, MD     05/17/2024 12:33 PM Patient Name: Gregory Hood MRN: [id number] Epilepsy Attending: Lek Gregg Referring Physician/Provider: No ref. provider found     Date: 05/17/2024 Duration:  24 minutes Patient history: 73 year old man with altered mental status. EEG to rule out seizures Level of alertness: Drowsy, AEDs during EEG study: None Technical aspects: This EEG study was done with scalp electrodes positioned according to the 10-20 International system of electrode placement. Electrical activity was reviewed with band pass filter of 1-70Hz , sensitivity of 7 uV/mm, display speed of 40mm/sec with a 60Hz  notched filter applied as appropriate. EEG data were recorded continuously and digitally stored.  Video monitoring was available and reviewed as appropriate. Description: EEG showed continuous generalized delta slowing. Hyperventilation and photic stimulation were not performed.   ABNORMALITY - Continuous slow, generalized IMPRESSION: This study is suggestive of moderate to severe diffuse encephalopathy, nonspecific etiology but likely related to sedation, toxic-metabolic etiology.  No seizures or epileptiform discharges were seen throughout the recording. Lek Gregg MD Neurology      ASSESSMENT/PLAN:  Assessment: Principal Problem:   Altered mental status Active Problems:   Essential hypertension, benign   DM (diabetes mellitus), type 2 with renal complications (HCC)   End stage renal disease (HCC)   BPH (benign prostatic hyperplasia)   HFrEF (heart failure with reduced ejection fraction) (HCC)   SSS (sick sinus syndrome) (HCC)   Polypharmacy   Hypoglycemia   Vascular dementia (HCC)   ESRD on dialysis (HCC)   AMS  (altered mental status)   Plan: Gregory Hood is a 73 y.o. male with ESRD on HD, multiple prior strokes with vascular dementia, HFpEF, and AF s/p pacemaker who presented with acute encephalopathy and weakness, admitted for evaluation and management of metabolic encephalopathy on hospital day 1.   # Acute Encephalopathy Subacute mental status decline has been reported over the past four days by the patient's wife, with the patient now somnolent and minimally verbal. The etiology appears multifactorial. A CT head was unremarkable, and no focal neurological deficits were observed. Metabolic evaluation revealed no vitamin deficiencies or electrolyte abnormalities; TSH was normal, and there was no leukocytosis. Chest X-ray demonstrated a mild left-sided pleural effusion with findings suggestive  of possible atelectasis or pneumonia, unchanged from imaging obtained several months ago. A respiratory viral panel was negative.Depakote  level below 10.  Today: The patient was inattentive and sleepy, though he communicated better than yesterday. He was oriented to himself and his wife. His inattention and disorientation are likely multifactorial, potentially related to delirium, underlying vascular dementia, chronic cerebral infarcts, and possible lobar pneumonia. The patient is oriented to self and his wife, more alert today, and communicates in short, simple phrases. His eyes were open, and his wife noted that he is eating better. EEG: consistent with chronic issues Plan: - cont Azithro 500 (day 2) _ cont Ceftriaxone  (day 2) - Consider MRI if no improvement  - HD MWF - Hold tramadol  and other sedating meds like trazodone  - Neuro checks q4h - Trend glucose AC/HS - Follow cultures; start antibiotics if chest xray showed changes - Monitor mental status    # ESRD on Hemodialysis (MWF) Patient was euvolemic, he does not make urine. Lab data is consistent with ESRD. Plan: - HD today per nephrology -  BMP - Avoid nephrotoxic meds   #polypharmacy - Discontinue losartan /HCTZ as the patient is anuric; start losartan  100 mg daily. - Discontinue finasteride  (patient is not producing urine). - Discontinue multivitamin, vitamin C, folic acid , and red yeast rice. - Hold tramadol  and trazodone due to altered mental status. - Decrease Namenda  to 5mg  BID from 10 mg Notably, the patient has not been taking Eliquis or carvedilol .  # Possible LLL Pneumonia vs Atelectasis CXR with basilar opacities and possible small L pleural effusion; afebrile and no leukocytosis here; SNF denies respiratory symptoms. Resp viral panel: negative, cough has not been getting worse.  Plan: - Cont Mucinex  - Monitor symptoms and vitals - Consider starting empiric ceftriaxone  and azithromycin  only if clinical decline - Incentive spirometry  # Hypoglycemia Improved.  Plan: - Hold Correctional Insulin  - start Glucose gel - AC/HS glucose checks - Ensure adequate PO intake - Review meds for hypoglycemia risk (none significant)  # Persistent Atrial Fibrillation s/p Pacemaker # Atrial flutter vs tachycardia Hx (12/20/23) V-paced rhythm on EKG; rate controlled. Plan: - Continue amiodarone  - Continue aspirin  + clopidogrel  - DC Telemetry monitoring  #Chronic HFpEF Patient is euvolemic.  Plan: - HD as above - DC losartan -HCTZ  - Continue  spironolactone  and losartan  100 - Monitor respiratory status, daily weights - Fluid restriction (2 L)  # Multiple Prior CVA / Vascular Dementia Lower neurologic reserve; worsened by metabolic derangements. Plan: - Continue memantine  - Address reversible contributors (uremia, pain meds, hypoglycemia)  # Chronic Pain Tramadol  recently increased at SNF; high risk in ESRD. Plan: - HOLD tramadol  - Scheduled Tylenol  PRN - Lidocaine  patch for back pain - Avoid opioids unless severe breakthrough  # Constipation At baseline; on stool softener. Plan: Continue stool  regimen Monitor BM frequency  # Chronic Anemia of CKD Hgb 10.3, stable. Plan: Continue iron  Monitor CBC  # Thrombocytopenia Platelets 82; chronic. Plan: - Monitor CBC - Continue heparin  for DVT ppx unless bleeding     Best practice: Diet: Normal VTE: Heparin  IVF: None,None Code: Full   Disposition planning: Prior to Admission Living Arrangement: SNF, [place] Anticipated Discharge Location: SNF    Signature:  Naziah Portee [facility]  7:20 AM, 05/18/2024  On Call pager [phone]

## 2024-05-18 NOTE — Procedures (Signed)
 Received patient in bed to unit.  Alert and oriented to self. Informed consent signed and in chart. All procedures explained. LUA AVG cannulated x 2 with 15g needles per policy. Secured well with tape. Tx initiated per order. No UF goal today.  TX duration: 3 hours  Tx complete. Blood returned, needles removed, sites held x 2 until hemostasis achieved, gauze changed prior to taping  Patient tolerated well.  Transported back to the room  Alert, without acute distress.  Hand-off given to patient's nurse.   Access used: LUA AVG Access issues: none  Total UF removed: 0 Medication(s) given: See MAR   Powell LITTIE [facility]

## 2024-05-18 NOTE — Care Management Important Message (Signed)
 Important Message  Patient Details  Name: Gregory Hood MRN: [id number] Date of Birth: [birth year]   Important Message Given:  Yes - Medicare IM     [name] 05/18/2024, 2:29 PM

## 2024-05-18 NOTE — Progress Notes (Signed)
 Speech Language Pathology Treatment: Dysphagia  Patient Details Name: Gregory Hood MRN: [id number] DOB: March 19, 1951 Today's Date: 05/18/2024 Time: 8664-8655 SLP Time Calculation (min) (ACUTE ONLY): 9 min  Assessment / Plan / Recommendation Clinical Impression  Pt seen for dysphagia and ability to upgrade and discussed with wife on arrival who reports she prefers pt remain on minced/chopped food as he is tolerating with some intermittent residue present and was on this at SNF. Observed with pieces of graham cracker that he was able to masticate timely but leaving mild diffuse small pieces of cracker on lingual surface that he could not clear with cues to use tongue sweep and subsequent swallow. Sips water efficiently cleared and wife states she uses this with pt. No s/s aspiration. Recommend continue Dys 2 (minced/fine chopped), thin liquids and check for pocketed food using strategies to clear. No further ST needed on acute and pt can have ST at SNF if needed.    HPI HPI: Patient is a 73 y.o. 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 Patient's wife reports a new dry cough that began on 11/16 but no shortness of breath or chest pain. Bedside swallow evaluation ordered to assess patient's swallow.      SLP Plan  All goals met;Discharge SLP treatment due to (comment)          Recommendations  Diet recommendations: Dysphagia 2 (fine chop);Thin liquid Liquids provided via: Cup;Straw Medication Administration: Other (Comment) (as tolerated) Supervision: Full supervision/cueing for compensatory strategies Compensations: Slow rate;Small sips/bites;Lingual sweep for clearance of pocketing Postural Changes and/or Swallow Maneuvers: Seated upright 90  degrees                  Oral care BID   Frequent or constant Supervision/Assistance Dysphagia, unspecified (R13.10)     All goals met;Discharge SLP treatment due to (comment)     Dustin Olam Bull  05/18/2024, 2:28 PM

## 2024-05-18 NOTE — Plan of Care (Signed)
   Problem: Coping: Goal: Ability to adjust to condition or change in health will improve Outcome: Progressing   Problem: Fluid Volume: Goal: Ability to maintain a balanced intake and output will improve Outcome: Progressing   Problem: Skin Integrity: Goal: Risk for impaired skin integrity will decrease Outcome: Progressing

## 2024-05-19 DIAGNOSIS — D631 Anemia in chronic kidney disease: Secondary | ICD-10-CM | POA: Diagnosis not present

## 2024-05-19 DIAGNOSIS — Z992 Dependence on renal dialysis: Secondary | ICD-10-CM | POA: Diagnosis not present

## 2024-05-19 DIAGNOSIS — F0150 Vascular dementia without behavioral disturbance: Secondary | ICD-10-CM

## 2024-05-19 DIAGNOSIS — N186 End stage renal disease: Secondary | ICD-10-CM | POA: Diagnosis not present

## 2024-05-19 DIAGNOSIS — G9340 Encephalopathy, unspecified: Secondary | ICD-10-CM | POA: Diagnosis not present

## 2024-05-19 DIAGNOSIS — D696 Thrombocytopenia, unspecified: Secondary | ICD-10-CM

## 2024-05-19 LAB — CULTURE, BLOOD (ROUTINE X 2)
Culture: NO GROWTH
Special Requests: ADEQUATE

## 2024-05-19 LAB — GLUCOSE, CAPILLARY
Glucose-Capillary: 79 mg/dL (ref 70–99)
Glucose-Capillary: 114 mg/dL — ABNORMAL HIGH (ref 70–99)
Glucose-Capillary: 126 mg/dL — ABNORMAL HIGH (ref 70–99)

## 2024-05-19 MED ORDER — CHLORHEXIDINE GLUCONATE CLOTH 2 % EX PADS
6.0000 | MEDICATED_PAD | Freq: Every day | CUTANEOUS | Status: DC
  Administered 2024-05-20: 6 via TOPICAL

## 2024-05-19 MED ORDER — SODIUM CHLORIDE 0.9 % IV SOLN
1.0000 g | INTRAVENOUS | Status: AC
  Administered 2024-05-20: 1 g via INTRAVENOUS
  Filled 2024-05-19: qty 10

## 2024-05-19 NOTE — Plan of Care (Signed)
  Problem: Health Behavior/Discharge Planning: Goal: Ability to identify and utilize available resources and services will improve Outcome: Progressing   Problem: Health Behavior/Discharge Planning: Goal: Ability to identify and utilize available resources and services will improve Outcome: Progressing   Problem: Education: Goal: Ability to describe self-care measures that may prevent or decrease complications (Diabetes Survival Skills Education) will improve Outcome: Progressing   Problem: Health Behavior/Discharge Planning: Goal: Ability to identify and utilize available resources and services will improve Outcome: Progressing   Problem: Metabolic: Goal: Ability to maintain appropriate glucose levels will improve Outcome: Progressing   Problem: Nutritional: Goal: Maintenance of adequate nutrition will improve Outcome: Progressing   Problem: Elimination: Goal: Will not experience complications related to bowel motility Outcome: Progressing Goal: Will not experience complications related to urinary retention Outcome: Progressing   Problem: Pain Managment: Goal: General experience of comfort will improve and/or be controlled Outcome: Progressing   Problem: Safety: Goal: Ability to remain free from injury will improve Outcome: Progressing   Problem: Skin Integrity: Goal: Risk for impaired skin integrity will decrease Outcome: Progressing

## 2024-05-19 NOTE — Progress Notes (Signed)
 IMTS Cross Cover Note  Received page from nursing about patient being found with his head on the floor but not completely out of the bed. There were floor mats on the one side of the bed but not the side that the patient had gone out of the bed on. The bed alarm was on. It is unclear whether the patient hit his head on the floor. A portion of her torso was hanging out of the bed but his legs and feet were still in the bed. Upon arrival in the room the patient had been moved back to the bed by nursing. He initially reported that he did not hit his head, despite his head being on the ground when nursing arrived and stated that he slowly slipped out of the bed. Later he denied falling out of the bed at all. He denied pain, fevers, chills, confusion. He did endorse that he was sleepy. He did not note any changes in his vision.   Exam: Vitals:   05/19/24 0255 05/19/24 0342  BP: 117/74 122/75  Pulse: 69 78  Resp: 18 16  Temp: 98.5 F (36.9 C)   SpO2: 100% 100%    General: Sleep elderly male in bed. Cardio: RRR Pulm: CTAB Neuro: Alert to place and self. EOMI, sensation of the face, arms and legs symmetrical. Pupil on the right was small but reactive. Pupil not visualized on the left but this is the patients baseline. Head: Palpable, hard nodule on the back of the skull. No areas of fluctuance.   Fall? Question whether the patient had an actual fall or if he slowly slid down to the floor. He is not currently in any pain. He is oriented to place and self and has no focal neurologic deficits. He is on aspirin  and plavix  but not on any other blood thinners. Will defer CT head given low energy fall and lack of neurologic deterioration.  Plan: - Telesitter, bed alarm, and floor mats on both sides of the bed. - Monitor neuro status - Assess for continued pain in the morning, treat as appropriate - Continue Delirium precautions   [facility] Internal Medicine Resident PGY-1 Please contact  the on-call pager after 5 pm and on weekends at [phone]

## 2024-05-19 NOTE — Progress Notes (Signed)
 Ok to give scheduled IV Rocephin  now as pt may be possibly discharged today per Dr. Celestina.

## 2024-05-19 NOTE — Progress Notes (Signed)
 Post Fall Note  05/19/24 0342  What Happened  Was fall witnessed? No  Was patient injured? Yes  Patient found on floor  Found by Staff-comment  Stated prior activity other (comment) (Pt confused)  Provider Notification  Provider Name/Title Elnora, MD; Napoleon, MD  Date Provider Notified 05/19/24  Time Provider Notified [phone]  Method of Notification Virtual Face-to-face (Secure Chat)  Notification Reason Fall  Provider response En route;See new orders  Date of Provider Response 05/19/24  Time of Provider Response 0351  Follow Up  Family notified Yes - comment (will call Wife, [name])  Progress note created (see row info) Yes  Adult Fall Risk Assessment  Risk Factor Category (scoring not indicated) Fall has occurred during this admission (document High fall risk)  Age 15  Fall History: Fall within 6 months prior to admission 0  Elimination; Bowel and/or Urine Incontinence 2  Elimination; Bowel and/or Urine Urgency/Frequency 2  Medications: includes PCA/Opiates, Anti-convulsants, Anti-hypertensives, Diuretics, Hypnotics, Laxatives, Sedatives, and Psychotropics 3  Patient Care Equipment 1  Mobility-Assistance 2  Mobility-Gait 2  Mobility-Sensory Deficit 2  Altered awareness of immediate physical environment 1  Impulsiveness 2  Lack of understanding of one's physical/cognitive limitations 4  Total Score 23  Adult Fall Risk Interventions  Required Bundle Interventions *See Row Information* High fall risk  Screening for Fall Injury Risk (To be completed on HIGH fall risk patients) - Assessing Need for Floor Mats  Risk For Fall Injury- Criteria for Floor Mats Confusion/dementia (+NuDESC, CIWA, TBI, etc.)  Will Implement Floor Mats Yes  Vitals  BP 122/75  MAP (mmHg) 90  BP Location Left Arm  BP Method Automatic  Patient Position (if appropriate) Lying  Pulse Rate 78  Pulse Rate Source Monitor  Resp 16  Oxygen Therapy  SpO2 100 %  O2 Device Room Air  Pain Assessment   Pain Scale Faces  Faces Pain Scale 6  PAINAD (Pain Assessment in Advanced Dementia)  Breathing 0  Negative Vocalization 1  Facial Expression 2  Body Language 2  Consolability 1  PAINAD Score 6  PCA/Epidural/Spinal Assessment  Respiratory Pattern Regular;Unlabored  Neurological  Neuro (WDL) X  Level of Consciousness Alert  Orientation Level Oriented to person  Cognition Poor safety awareness;Poor attention/concentration;Memory impairment;Follows commands  Speech Incomprehensible  Glasgow Coma Scale  Eye Opening 4  Best Verbal Response (NON-intubated) 4  Best Motor Response 6  Glasgow Coma Scale Score 14  Musculoskeletal  Musculoskeletal (WDL) X  Assistive Device None  Generalized Weakness Yes  Weight Bearing Restrictions Per Provider Order No  Musculoskeletal Details  RLE Limited movement  LLE Limited movement  Integumentary  Integumentary (WDL) WDL

## 2024-05-19 NOTE — Progress Notes (Signed)
 HD#4 SUBJECTIVE:  Patient Summary: Gregory Hood is a 73 y.o. male with ESRD on HD, multiple prior strokes with vascular dementia, HFpEF, and AF s/p pacemaker who presented with AMS and weakness, admitted for evaluation and management of acute encephalopathy on hospital day 3   Overnight Events: The patient was found partially slipped from the bed with his head near the floor but no clear evidence of head impact, denied pain or symptoms, and remained stable after being assisted back to bed.  Interim History: The patient is oriented to self and his wife, more alert today, and communicates in short, simple phrases. His eyes were open, and his wife noted that he is eating better.    OBJECTIVE:  Vital Signs: Vitals:   05/18/24 2203 05/19/24 0255 05/19/24 0342 05/19/24 0724  BP: 136/75 117/74 122/75 130/87  Pulse: 70 69 78 70  Resp: 18 18 16 20   Temp: 98 F (36.7 C) 98.5 F (36.9 C)  97.7 F (36.5 C)  TempSrc:    Oral  SpO2: 100% 100% 100% 95%  Weight:      Height:       Supplemental O2: Room Air SpO2: 95 %  Filed Weights   05/16/24 1716 05/18/24 1730 05/18/24 2108  Weight: 78.1 kg 80.1 kg 80.1 kg     Intake/Output Summary (Last 24 hours) at 05/19/2024 1314 Last data filed at 05/19/2024 0900 Gross per 24 hour  Intake 125 ml  Output 0 ml  Net 125 ml   Net IO Since Admission: -1,623.1 mL [05/19/24 1314]  Physical Exam: Physical Exam HENT: Normocephalic and atraumatic; mucous membranes moist. Eyes: Left eye blind with opaque pupil; right pupil constricted; conjunctivae non-erythematous. Neck: Supple; no meningismus. Cardiovascular: Regular rate and rhythm; no murmurs, rubs, or gallops. Pulmonary/Chest: Normal work of breathing on room air; lungs clear to auscultation except mild rales in left lower side.. Abdomen: Soft, non-tender, non-distended; no guarding or rebound. MSK: Poor muscle bulk, particularly in the lower extremities; chronic generalized weakness; normal  tone in upper Ext. Neurological:Patient was; moves all extremities spontaneously; no new focal deficits appreciated. Skin: Warm and dry; no rashes or lesions. Psych: Flat affect; limited engagement.  The patient is oriented to self and his wife, more alert today, and communicates in short, simple phrases.  Patient Lines/Drains/Airways Status     Active Line/Drains/Airways     Name Placement date Placement time Site Days   Peripheral IV 05/18/24 22 G 1.75 Anterior;Right;Upper Arm 05/18/24  1451  Arm  1   Fistula / Graft Left Upper arm Arteriovenous fistula 03/06/15  1150  Upper arm  3362   Fistula / Graft Left Upper arm 03/22/18  0627  Upper arm  2250            Pertinent labs and imaging:      Latest Ref Rng & Units 05/18/2024    5:59 AM 05/16/2024    2:05 AM 05/15/2024   12:36 PM  CBC  WBC 4.0 - 10.5 K/uL 5.0  4.8  3.8   Hemoglobin 13.0 - 17.0 g/dL 88.7  89.6  89.4   Hematocrit 39.0 - 52.0 % 34.5  31.2  31.9   Platelets 150 - 400 K/uL 102  82  87        Latest Ref Rng & Units 05/18/2024    5:59 AM 05/16/2024    2:05 AM 05/15/2024   12:36 PM  CMP  Glucose 70 - 99 mg/dL 899  88  84  BUN 8 - 23 mg/dL 36  46  43   Creatinine 0.61 - 1.24 mg/dL 1.99  0.87  1.54   Sodium 135 - 145 mmol/L 135  138  136   Potassium 3.5 - 5.1 mmol/L 4.7  4.9  4.4   Chloride 98 - 111 mmol/L 93  95  93   CO2 22 - 32 mmol/L 24  24  26    Calcium  8.9 - 10.3 mg/dL 9.3  9.3  9.3     No results found.  ASSESSMENT/PLAN:  Assessment: Principal Problem:   Altered mental status Active Problems:   Essential hypertension, benign   DM (diabetes mellitus), type 2 with renal complications (HCC)   End stage renal disease (HCC)   BPH (benign prostatic hyperplasia)   HFrEF (heart failure with reduced ejection fraction) (HCC)   SSS (sick sinus syndrome) (HCC)   Polypharmacy   Hypoglycemia   Vascular dementia (HCC)   ESRD on dialysis (HCC)   AMS (altered mental status)   Community acquired  pneumonia   Plan:  Gregory Hood is a 73 y.o. male with ESRD on HD, multiple prior strokes with vascular dementia, HFpEF, and AF s/p pacemaker who presented with acute encephalopathy and weakness, admitted for evaluation and management of metabolic encephalopathy on hospital day 1.   # Acute Encephalopathy Subacute mental status decline has been reported over the past four days by the patient's wife, with the patient now somnolent and minimally verbal. The etiology appears multifactorial. A CT head was unremarkable, and no focal neurological deficits were observed. Metabolic evaluation revealed no vitamin deficiencies or electrolyte abnormalities; TSH was normal, and there was no leukocytosis. Chest X-ray demonstrated a mild left-sided pleural effusion with findings suggestive of possible atelectasis or pneumonia, unchanged from imaging obtained several months ago. A respiratory viral panel was negative.Depakote  level below 10.  Today: The patient was inattentive and sleepy, though he communicated better than yesterday. He was oriented to himself and his wife. His inattention and disorientation are likely multifactorial, potentially related to delirium, underlying vascular dementia, chronic cerebral infarcts, and possible lobar pneumonia. The patient is oriented to self and his wife, more alert today, and communicates in short, simple phrases. His eyes were open, and his wife noted that he is eating better. EEG: consistent with chronic issues Plan: - cont Azithro 500 (day 3, last day) - cont Ceftriaxone  for 5 days (day 3) - Consider MRI if no improvement  - HD MWF - Hold tramadol  and other sedating meds like trazodone  - Neuro checks q4h - Trend glucose AC/HS - Follow cultures; start antibiotics if chest xray showed changes - Monitor mental status    # ESRD on Hemodialysis (MWF) Patient was euvolemic, he does not make urine. Lab data is consistent with ESRD. Plan: - HD today per  nephrology - BMP - Avoid nephrotoxic meds   #polypharmacy - Discontinue losartan /HCTZ as the patient is anuric; start losartan  100 mg daily. - Discontinue finasteride  (patient is not producing urine). - Discontinue multivitamin, vitamin C, folic acid , and red yeast rice. - Hold tramadol  and trazodone due to altered mental status. - Decrease Namenda  to 5mg  BID from 10 mg Notably, the patient has not been taking Eliquis or carvedilol .  # Possible LLL Pneumonia vs Atelectasis CXR with basilar opacities and possible small L pleural effusion; afebrile and no leukocytosis here; SNF denies respiratory symptoms. Resp viral panel: negative, cough has not been getting worse.  Plan: - Cont Mucinex  - Monitor symptoms and vitals -  Consider starting empiric ceftriaxone  and azithromycin  only if clinical decline - Incentive spirometry  # Hypoglycemia Improved.  Plan: - Hold Correctional Insulin  - start Glucose gel - AC/HS glucose checks - Ensure adequate PO intake - Review meds for hypoglycemia risk (none significant)  # Persistent Atrial Fibrillation s/p Pacemaker # Atrial flutter vs tachycardia Hx (12/20/23) V-paced rhythm on EKG; rate controlled. Plan: - Continue amiodarone  - Continue aspirin  + clopidogrel  - DC Telemetry monitoring  #Chronic HFpEF Patient is euvolemic.  Plan: - HD as above - DC losartan -HCTZ  - Continue  spironolactone  and losartan  100 - Monitor respiratory status, daily weights - Fluid restriction (2 L)  # Multiple Prior CVA / Vascular Dementia Lower neurologic reserve; worsened by metabolic derangements. Plan: - Continue memantine  - Address reversible contributors (uremia, pain meds, hypoglycemia)  # Chronic Pain Tramadol  recently increased at SNF; high risk in ESRD. Plan: - HOLD tramadol  - Scheduled Tylenol  PRN - Lidocaine  patch for back pain - Avoid opioids unless severe breakthrough  # Constipation At baseline; on stool softener. Plan: Continue  stool regimen Monitor BM frequency  # Chronic Anemia of CKD Hgb 11.2, stable. Plan: Continue iron  Monitor CBC  # Thrombocytopenia Platelets 102; chronic. Plan: - Monitor CBC - Continue heparin  for DVT ppx unless bleeding     Best practice: Diet: Normal VTE: Heparin  IVF: None,None Code: Full   Disposition planning: Prior to Admission Living Arrangement: SNF, [place] Anticipated Discharge Location: SNF   Signature:  Zacharey Jensen [facility]  1:14 PM, 05/19/2024  On Call pager [phone]

## 2024-05-19 NOTE — Discharge Instructions (Signed)
 Thank you for allowing us  to be part of your care. You were hospitalized because of confusion, weakness and a cough.  We treated your cough with antibiotics and you seem overall doing better.  We have stopped some CNS sedating medicines, and your mentation seems better.  See the changes in your medications and management of your chronic conditions below:  *We have stopped this medications - Tramadol  50 mg - Trazodone 50 mg - Losartan -HCTZ 100-12.5 mg  For pneumonia, you will continue with cefpodoxime  200 mg twice a day for 2 more days.  - For vascular dementia, we have decreased the dose of memantine  to 5 mg twice daily; based on your kidney function. - We have stopped trazodone and tramadol  as this can worsen mentation; make you feel more drowsy.  For hypertension, we have discontinued the combination blood pressure medicine losartan -HCTZ due to softer blood pressures. - We have continued her on 100 mg losartan  daily.  Please make a follow-up appointment with your primary care doctor in the next 7 to 10 days.   Please call your PCP or our clinic if you have any questions or concerns, we may be able to help and keep you from a long and expensive emergency room wait. Our clinic and after hours phone number is [phone]. The best time to call is Monday through Friday 9 am to 4 pm but there is always someone available 24/7 if you have an emergency. If you need medication refills please notify your pharmacy one week in advance and they will send us  a request.   We are glad you are feeling better,  Missy Sandhoff Internal Medicine Inpatient Teaching Service at [facility]

## 2024-05-19 NOTE — H&P (Incomplete)
 Name: Gregory Hood MRN: [id number] DOB: [birth year] 73 y.o. PCP: Gregory Carwin, MD  Date of Admission: 05/14/2024  8:34 AM Date of Discharge: 05/19/2024 Attending Physician: Dr. Jone Hood  Discharge Diagnosis: 1. Principal Problem:   Altered mental status Active Problems:   Essential hypertension, benign   DM (diabetes mellitus), type 2 with renal complications (HCC)   End stage renal disease (HCC)   BPH (benign prostatic hyperplasia)   HFrEF (heart failure with reduced ejection fraction) (HCC)   SSS (sick sinus syndrome) (HCC)   Polypharmacy   Hypoglycemia   Vascular dementia (HCC)   ESRD on dialysis (HCC)   AMS (altered mental status)   Community acquired pneumonia   Discharge Medications: Allergies as of 05/20/2024       Reactions   Bee Venom Swelling   Ace Inhibitors Cough   Lexiscan  [regadenoson ] Other (See Comments)   Seizure like-activity after lexiscan  given.   Statins Other (See Comments)   Other reaction(s): Other (See Comments) Myalgias and CK increase.  Tried both atorvastatin  and rosuvastatin         Medication List     PAUSE taking these medications    spironolactone  50 MG tablet Wait to take this until your doctor or other care provider tells you to start again. Commonly known as: ALDACTONE  Take 1 tablet by mouth daily.       STOP taking these medications    carvedilol  3.125 MG tablet Commonly known as: COREG    cyanocobalamin  1000 MCG/ML injection Commonly known as: VITAMIN B12   EPINEPHrine  0.3 mg/0.3 mL Soaj injection Commonly known as: EPI-PEN   ezetimibe  10 MG tablet Commonly known as: ZETIA    folic acid  1 MG tablet Commonly known as: FOLVITE    furosemide  40 MG tablet Commonly known as: LASIX    Insulin  Aspart FlexPen 100 UNIT/ML Commonly known as: NOVOLOG    Iron 325 (65 Fe) MG Tabs   losartan -hydrochlorothiazide  100-12.5 MG tablet Commonly known as: HYZAAR   multivitamin Tabs tablet   polyethylene glycol 17 g  packet Commonly known as: MIRALAX  / GLYCOLAX    Red Yeast Rice 600 MG Caps   terbinafine 250 MG tablet Commonly known as: LAMISIL   traMADol  50 MG tablet Commonly known as: ULTRAM    traZODone 150 MG tablet Commonly known as: DESYREL       TAKE these medications    acetaminophen  500 MG tablet Commonly known as: TYLENOL  Take 500 mg by mouth daily.   amiodarone  200 MG tablet Commonly known as: PACERONE  Take 200 mg by mouth daily.   aspirin  81 MG tablet Take 1 tablet (81 mg total) by mouth at bedtime.   atropine  1 % ophthalmic solution Place 1 drop into both eyes.   bisacodyl  10 MG suppository Commonly known as: DULCOLAX Place 10 mg rectally as needed for moderate constipation.   calcium  acetate 667 MG capsule Commonly known as: PHOSLO  Take 667 mg by mouth 3 (three) times daily.   cefpodoxime  200 MG tablet Commonly known as: VANTIN  Take 1 tablet (200 mg total) by mouth 2 (two) times daily.   clopidogrel  75 MG tablet Commonly known as: PLAVIX  Take 75 mg by mouth daily.   divalproex  125 MG DR tablet Commonly known as: DEPAKOTE  Take 125 mg by mouth daily at 12 noon.   finasteride  5 MG tablet Commonly known as: PROSCAR  Take 5 mg by mouth daily.   GLUCAGON EMERGENCY IJ Inject 1 mg into the muscle as needed (blood sugar less than 70).   Glucose 77.4 %  Gel Take 1 Dose by mouth as needed (blood glucose less than 70).   losartan  100 MG tablet Commonly known as: COZAAR  Take 1 tablet (100 mg total) by mouth at bedtime. What changed:  medication strength how much to take when to take this   melatonin 3 MG Tabs tablet Take 3 mg by mouth at bedtime.   memantine  5 MG tablet Commonly known as: NAMENDA  Take 1 tablet (5 mg total) by mouth 2 (two) times daily. What changed:  medication strength how much to take        Disposition and follow-up:   [name] was discharged from [facility] in stable condition.  At the hospital  follow-up visit, please address:   1. Acute Encephalopathy Recovery Assess current mental status compared to SNF baseline. Review for improvement after dialysis resumption, medication adjustments, and pneumonia treatment. Ensure sedating medications (tramadol , trazodone) remain discontinued.  2. Pneumonia Follow-Up Confirm completion of ceftriaxone /azithromycin . Evaluate ongoing cough, sputum retention, fever, or oxygen need.  3. ESRD on Hemodialysis (MWF) Verify adherence to outpatient dialysis schedule. Review volume status and blood pressure trends. Ensure no new issues with AV fistula.  4. Medication Reconciliation Confirm the following changes are continued: STOPPED: tramadol , trazodone, losartan /HCTZ, finasteride , multivitamin, folic acid , vitamin C, red yeast rice ADJUSTED: memantine  reduced to 5 mg BID Review need for restarting carvedilol  or anticoagulation (Eliquis), which he has not been taking at SNF.  5. Blood Glucose Stability Assess for recurrence of hypoglycemia. Review SNF AC/HS glucose log and ensure adequate oral intake.  6. Heart Failure / AF / Pacemaker Continue rate control with amiodarone . Continue aspirin  + clopidogrel . Evaluate blood pressure, weight, and volume status.  7. PT/OT Needs Review functional status, mobility, transfers, and potential therapy adjustments.   Labs / Imaging Needed at Follow-Up CBC BMP AC/HS glucose log review Repeat chest X-ray only if symptoms persist (ongoing cough, fever, or hypoxia)  Follow-up Appointments:    Hospital Course by problem list: Gregory Hood is a 73 y.o. person living with a history of ESRD on hemodialysis, multiple prior strokes with vascular dementia, HFpEF, and atrial fibrillation s/p pacemaker who presented with worsening confusion and weakness and was admitted for multifactorial acute encephalopathy, now being discharged on hospital day 4 with the following pertinent hospital course.   Acute  Encephalopathy (Multifactorial) The patient presented from his SNF with 2-3 days of progressive confusion, lethargy, poor interaction, and decreased oral intake. Initial evaluation revealed hypoglycemia, uremia after missing dialysis, and polypharmacy with sedating medications, all likely contributing. CT head showed no acute intracranial process. He was afebrile with no leukocytosis.  He received IV D50 and oral carbohydrates with improvement in glucose. His mental status improved markedly after hemodialysis on admission night, supporting uremia as a key factor. Sedating agents (tramadol , trazodone, high-dose memantine , melatonin) were held.  On HD#2 he became more somnolent again with increasing cough; CXR revealed left basilar opacity concerning for possible pneumonia. EEG showed moderate-severe diffuse slowing without seizures, consistent with metabolic encephalopathy. Ceftriaxone  and azithromycin  were started for presumed CAP.  By HD#3 he was more alert, oriented to self and wife, eating better, and speaking in short phrases. No focal deficits developed. Mental status returned close but not to his reported baseline by discharge.   ESRD on Hemodialysis (MWF) He had missed his scheduled dialysis session prior to admission. Labs on presentation showed elevated BUN/Cr, mild hyperkalemia, and uremia. Nephrology recommended urgent hemodialysis, which he received the night of admission with good  tolerance and improved alertness.  He remained hemodynamically stable, euvolemic, and continued on his usual MWF routine during hospitalization. BMPs remained stable. He was cleared to return to his outpatient dialysis center at discharge.  Hypoglycemia Initial glucose in the ED was 54-63 mg/dL, likely from poor intake and excessive sedation from tramadol . He received D50 and oral carbohydrates with rapid correction. No recurrent episodes occurred with improvement in mental status and resumed PO intake.  Correctional insulin  was held and glucose remained stable on AC/HS checks.  Left Basilar Opacity /Atelectasis vs Pneumonia Initial CXR on admission showed mild left airway opacities with small pleural effusion, thought to represent atelectasis given lack of fever, leukocytosis, or symptoms.  On HD#2 he developed increasing cough with suspected sputum retention and night sweats. Repeat CXR demonstrated left basilar consolidation with small pleural effusion concerning for pneumonia. He was started on ceftriaxone  and azithromycin  with clinical improvement in alertness and respiratory symptoms. He remained on room air throughout admission.   Heart Failure with Preserved EF He had mild volume elevation on admission secondary to missed dialysis but showed no pulmonary edema. Ultrafiltration during HD corrected his volume status. Blood pressures and oxygenation remained stable. No diuretics were required, and he remained on home HF medications as adjusted by nephrology.   Atrial Fibrillation / Sick Sinus Syndrome s/p Pacemaker  He remained ventricular-paced with controlled rates throughout admission. No arrhythmias were captured on telemetry. He continued home amiodarone  and dual antiplatelet therapy. Carvedilol  had not been taken consistently at SNF and remained held given stable rate control.   Multiple Prior CVAs / Vascular Dementia The patient's baseline includes limited verbal output and poor recall. Mental status worsened with metabolic derangements (uremia, hypoglycemia, medications), but gradually returned closer to baseline with correction of reversible factors and treatment of suspected pneumonia. EEG confirmed diffuse metabolic encephalopathy without seizure activity.  . Polypharmacy Several medications contributing to sedation or without benefit in anuric ESRD were discontinued. Adjustments included:  Stopping losartan /HCTZ and initiating losartan  100 mg daily  Discontinuing finasteride ,  multivitamins, vitamin C, folic acid , red yeast rice  Discontinued tramadol  and trazodone  Reducing memantine  to 5 mg BID  These changes likely aided in mental status improvement. Wife was updated on all medication adjustments.   Chronic Pain His SNF had recently increased tramadol  dose, likely worsening encephalopathy. Tramadol  was held throughout admission. Pain was managed successfully with scheduled acetaminophen  and lidocaine  patches. No opioids were required.   Constipation Chronic issue at baseline. He continued his home bowel regimen with adequate bowel movements and no abdominal symptoms during hospitalization. .   Stable chronic medical conditions: Essential hypertension Type 2 diabetes Vascular dementia  Subjective Cough is much improved, mental status about the same. Seen in HD.  Discharge Exam:   BP (!) 148/86   Pulse 72   Temp 97.9 F (36.6 C)   Resp 17   Ht 5' 9 (1.753 m)   Wt 77.3 kg   SpO2 93%   BMI 25.17 kg/m  Discharge exam:  HENT: Normocephalic and atraumatic; mucous membranes moist. Eyes: Left eye blind with opaque pupil; right pupil constricted; conjunctivae non-erythematous. Neck: Supple; no meningismus. Cardiovascular: Regular rate and rhythm; no murmurs, rubs, or gallops. Pulmonary/Chest: Normal work of breathing on room air; lungs clear to auscultation except mild rales in left lower side.. Abdomen: Soft, non-tender, non-distended; no guarding or rebound. MSK: Poor muscle bulk, particularly in the lower extremities; chronic generalized weakness; normal tone in upper Ext. Neurological:Patient was; moves all extremities spontaneously; no  new focal deficits appreciated. Skin: Warm and dry; no rashes or lesions. Psych: Flat affect; limited engagement.  The patient is oriented to self and his wife, more alert today, and communicates in short, simple phrases. Pertinent Labs, Studies, and Procedures:     Latest Ref Rng & Units 05/20/2024    5:00  AM 05/18/2024    5:59 AM 05/16/2024    2:05 AM  CBC  WBC 4.0 - 10.5 K/uL 5.3  5.0  4.8   Hemoglobin 13.0 - 17.0 g/dL 88.7  88.7  89.6   Hematocrit 39.0 - 52.0 % 33.3  34.5  31.2   Platelets 150 - 400 K/uL 112  102  82        Latest Ref Rng & Units 05/20/2024    6:47 AM 05/20/2024    5:00 AM 05/18/2024    5:59 AM  CMP  Glucose 70 - 99 mg/dL 85  92  899   BUN 8 - 23 mg/dL 31  31  36   Creatinine 0.61 - 1.24 mg/dL 2.31  2.38  1.99   Sodium 135 - 145 mmol/L 134  134  135   Potassium 3.5 - 5.1 mmol/L 4.3  4.3  4.7   Chloride 98 - 111 mmol/L 92  93  93   CO2 22 - 32 mmol/L 26  26  24    Calcium  8.9 - 10.3 mg/dL 9.5  9.6  9.3     DG Chest 2 View Result Date: 05/14/2024 EXAM: 2 VIEW(S) XRAY OF THE CHEST 05/14/2024 12:59:00 PM COMPARISON: 11/11/2023 CLINICAL HISTORY: weakness FINDINGS: LINES, TUBES AND DEVICES: Dual lead pacer with leads at right atrium and right ventricle. LUNGS AND PLEURA: Left greater than right basilar airspace disease. Possible small left pleural effusion versus artifact due to overlying soft tissues. No pneumothorax. HEART AND MEDIASTINUM: Moderate cardiomegaly. BONES AND SOFT TISSUES: No acute osseous abnormality. LIMITATIONS/ARTIFACTS: Lateral view degraded by patient arm position and overlying artifact. Frontal AP view mildly degraded by patient body habitus. IMPRESSION: 1. Both views are degraded secondary to positioning and technique as detailed above. 2. Possible small left pleural effusion with left greater than right basilar airspace disease. Although this could all represent atelectasis, cannot exclude left lower lobe pneumonia. 3. Cardiomegaly without congestive heart failure. Electronically signed by: Rockey Kilts MD 05/14/2024 01:55 PM EST RP Workstation: [id number]   CT Head Wo Contrast Result Date: 05/14/2024 EXAM: CT HEAD WITHOUT CONTRAST 05/14/2024 12:43:00 PM TECHNIQUE: CT of the head was performed without the administration of intravenous contrast.  Automated exposure control, iterative reconstruction, and/or weight based adjustment of the mA/kV was utilized to reduce the radiation dose to as low as reasonably achievable. COMPARISON: CT Head 06/01/23 CLINICAL HISTORY: Mental status change, unknown cause FINDINGS: BRAIN AND VENTRICLES: No acute hemorrhage. No evidence of acute infarct. No hydrocephalus. No extra-axial collection. No mass effect or midline shift. Cerebral atrophy. Patchy white matter hypodensities, compatible with chronic microvascular ischemic changes. 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 ORBITS: No acute abnormality. SINUSES: No acute abnormality. SOFT TISSUES AND SKULL: No acute soft tissue abnormality. No skull fracture. IMPRESSION: 1. No acute intracranial abnormality. 2. Cerebral atrophy and chronic microvascular ischemic change. Electronically signed by: Gilmore Molt MD 05/14/2024 01:01 PM EST RP Workstation: [id number]     Discharge Instructions:   Signed: Celestina Czar, MD 05/20/2024, 10:01 AM

## 2024-05-19 NOTE — Plan of Care (Signed)
  Problem: Education: Goal: Ability to describe self-care measures that may prevent or decrease complications (Diabetes Survival Skills Education) will improve 05/19/2024 0500 by Armida Delon BRAVO, RN Outcome: Progressing 05/19/2024 0500 by Armida Delon BRAVO, RN Outcome: Progressing Goal: Individualized Educational Video(s) Outcome: Progressing   Problem: Coping: Goal: Ability to adjust to condition or change in health will improve Outcome: Progressing   Problem: Fluid Volume: Goal: Ability to maintain a balanced intake and output will improve Outcome: Progressing   Problem: Health Behavior/Discharge Planning: Goal: Ability to identify and utilize available resources and services will improve Outcome: Progressing Goal: Ability to manage health-related needs will improve Outcome: Progressing   Problem: Metabolic: Goal: Ability to maintain appropriate glucose levels will improve Outcome: Progressing   Problem: Nutritional: Goal: Maintenance of adequate nutrition will improve Outcome: Progressing Goal: Progress toward achieving an optimal weight will improve Outcome: Progressing   Problem: Skin Integrity: Goal: Risk for impaired skin integrity will decrease Outcome: Progressing   Problem: Tissue Perfusion: Goal: Adequacy of tissue perfusion will improve Outcome: Progressing   Problem: Education: Goal: Knowledge of General Education information will improve Description: Including pain rating scale, medication(s)/side effects and non-pharmacologic comfort measures Outcome: Progressing   Problem: Health Behavior/Discharge Planning: Goal: Ability to manage health-related needs will improve Outcome: Progressing   Problem: Clinical Measurements: Goal: Ability to maintain clinical measurements within normal limits will improve Outcome: Progressing Goal: Will remain free from infection Outcome: Progressing Goal: Diagnostic test results will improve Outcome:  Progressing Goal: Respiratory complications will improve Outcome: Progressing Goal: Cardiovascular complication will be avoided Outcome: Progressing   Problem: Activity: Goal: Risk for activity intolerance will decrease Outcome: Progressing   Problem: Nutrition: Goal: Adequate nutrition will be maintained Outcome: Progressing   Problem: Coping: Goal: Level of anxiety will decrease Outcome: Progressing   Problem: Elimination: Goal: Will not experience complications related to bowel motility Outcome: Progressing Goal: Will not experience complications related to urinary retention Outcome: Progressing   Problem: Skin Integrity: Goal: Risk for impaired skin integrity will decrease Outcome: Progressing   Problem: Pain Managment: Goal: General experience of comfort will improve and/or be controlled Outcome: Not Progressing   Problem: Safety: Goal: Ability to remain free from injury will improve Outcome: Not Progressing

## 2024-05-19 NOTE — Evaluation (Signed)
 Physical Therapy Re-Evaluation Patient Details Name: Gregory Hood MRN: [id number] DOB: January 24, 1951 Today's Date: 05/19/2024  History of Present Illness  73 y.o. male presents to [facility] hospital on 05/14/2024 with AMS. Pt missed dialysis recently. CXR concerning for possible LLL PNA. PMH includes ESRD, CVA, vascular dementia, HFpEF, afib s/p PPM, chronic pain.  Clinical Impression  Pt supine in bed on entry, oriented to himself. Able to come to seated EoB with totalAx2. Pt able to sit without outside support for brief periods of time but generally needs assist for L lateral lean. Pt is long term resident at [facility], and was able to participate in transfers in sit to stand lift to transfer to a wheelchair. Pt has experienced a decline in his mobility and would benefit from PT/OT services when he returns to [facility] to regain his prior level of independence. PT will continue to follow acutely.        If plan is discharge home, recommend the following: Two people to help with walking and/or transfers;Two people to help with bathing/dressing/bathroom;Assistance with cooking/housework;Assistance with feeding;Direct supervision/assist for medications management;Direct supervision/assist for financial management;Assist for transportation;Help with stairs or ramp for entrance;Supervision due to cognitive status   Can travel by private vehicle   No    Equipment Recommendations None recommended by PT  Recommendations for Other Services       Functional Status Assessment Patient has had a recent decline in their functional status and demonstrates the ability to make significant improvements in function in a reasonable and predictable amount of time.     Precautions / Restrictions Precautions Precautions: Fall Recall of Precautions/Restrictions: Impaired Restrictions Weight Bearing Restrictions Per Provider Order: No      Mobility  Bed Mobility Overal bed mobility: Needs  Assistance Bed Mobility: Supine to Sit, Sit to Supine     Supine to sit: Total assist, +2 for physical assistance, +2 for safety/equipment, HOB elevated Sit to supine: Min assist   General bed mobility comments: pt initiates coming to sitting EoB, but needs totalAx2 for bringing trunk to upright. Pt sits EoB with PT but reports he is tired and lays back down, needing only min A for getting his LE back into the bed    Transfers                   General transfer comment: deferred as pt reported he was too tired          Balance Overall balance assessment: Needs assistance Sitting-balance support: Single extremity supported, Feet supported Sitting balance-Leahy Scale: Fair Sitting balance - Comments: has L lateral lean, PT sat beside him and he was able to correct with minimal tactile cuing Postural control: Left lateral lean     Standing balance comment: did not attempt                             Pertinent Vitals/Pain Pain Assessment Pain Assessment: Faces Breathing: normal Negative Vocalization: none Facial Expression: facial grimacing Body Language: tense, distressed pacing, fidgeting Consolability: no need to console PAINAD Score: 3 Pain Location: pt with intermittent grimacing with activity, unable to report locaion of pain Pain Descriptors / Indicators: Grimacing Pain Intervention(s): Monitored during session    Home Living Family/patient expects to be discharged to:: Skilled nursing facility                   Additional Comments: [facility]  Prior Function Prior Level of Function : Needs assist             Mobility Comments: per granddaughter, pt uses sit to stand lift for transfers OOB to wheelchair ADLs Comments: assist with bathing, dressing and toileting. Granddaughter also reports assist for meals     Extremity/Trunk Assessment             Cervical / Trunk Assessment Cervical / Trunk  Assessment: Kyphotic  Communication   Communication Communication: Impaired Factors Affecting Communication: Difficulty expressing self    Cognition Arousal: Lethargic Behavior During Therapy: Flat affect   PT - Cognitive impairments: History of cognitive impairments                       PT - Cognition Comments: history of vascular dementia per chart. Oriented only to himself Following commands: Impaired Following commands impaired: Follows one step commands with increased time, Follows multi-step commands inconsistently     Cueing Cueing Techniques: Verbal cues, Gestural cues, Tactile cues, Visual cues     General Comments General comments (skin integrity, edema, etc.): VSS on RA        Assessment/Plan    PT Assessment Patient needs continued PT services  PT Problem List Decreased strength;Decreased activity tolerance;Decreased balance;Decreased mobility;Decreased cognition;Decreased knowledge of use of DME;Decreased safety awareness;Decreased knowledge of precautions;Pain       PT Treatment Interventions DME instruction;Functional mobility training;Therapeutic activities;Therapeutic exercise;Balance training;Neuromuscular re-education;Cognitive remediation;Patient/family education    PT Goals (Current goals can be found in the Care Plan section)  Acute Rehab PT Goals Patient Stated Goal: to continue to transfer out of bed to wheelchair PT Goal Formulation: With family Time For Goal Achievement: 05/29/24 Potential to Achieve Goals: Fair    Frequency Min 1X/week        AM-PAC PT 6 Clicks Mobility  Outcome Measure Help needed turning from your back to your side while in a flat bed without using bedrails?: Total Help needed moving from lying on your back to sitting on the side of a flat bed without using bedrails?: Total Help needed moving to and from a bed to a chair (including a wheelchair)?: Total Help needed standing up from a chair using your arms  (e.g., wheelchair or bedside chair)?: Total Help needed to walk in hospital room?: Total Help needed climbing 3-5 steps with a railing? : Total 6 Click Score: 6    End of Session   Activity Tolerance: Patient limited by lethargy Patient left: in bed;with call bell/phone within reach;with bed alarm set Nurse Communication: Mobility status;Need for lift equipment PT Visit Diagnosis: Other abnormalities of gait and mobility (R26.89);Muscle weakness (generalized) (M62.81)    Time: 9098-9083 PT Time Calculation (min) (ACUTE ONLY): 15 min   Charges:   PT Evaluation $PT Re-evaluation: 1 Re-eval   PT General Charges $$ ACUTE PT VISIT: 1 Visit         Dewie Ahart B. Fleeta Lapidus PT, DPT Acute Rehabilitation Services Please use secure chat or  Call Office [phone]   Almarie KATHEE Fleeta [facility] 05/19/2024, 9:51 AM

## 2024-05-19 NOTE — Progress Notes (Signed)
 [facility] Progress Note   Subjective:   Patient seen and examined at bedside.  Pleasantly confused. Does not recall hanging off the side of the bed yesterday.  Denies chest pain, shortness of breath, HA, nausea, vomiting and diarrhea.    Objective Vitals:   05/18/24 2203 05/19/24 0255 05/19/24 0342 05/19/24 0724  BP: 136/75 117/74 122/75 130/87  Pulse: 70 69 78 70  Resp: 18 18 16 20   Temp: 98 F (36.7 C) 98.5 F (36.9 C)  97.7 F (36.5 C)  TempSrc:    Oral  SpO2: 100% 100% 100% 95%  Weight:      Height:       Physical Exam General:chronically ill appearing male in NAD Heart:RRR, no mrg Lungs:CTAB, nml WOB on RA Abdomen:soft, NTND Extremities:no LE edema Dialysis Access: LU AVF +b/t   Filed Weights   05/16/24 1716 05/18/24 1730 05/18/24 2108  Weight: 78.1 kg 80.1 kg 80.1 kg    Intake/Output Summary (Last 24 hours) at 05/19/2024 1424 Last data filed at 05/19/2024 0900 Gross per 24 hour  Intake 125 ml  Output 0 ml  Net 125 ml    Additional Objective Labs: Basic Metabolic Panel: Recent Labs  Lab 05/15/24 1236 05/16/24 0205 05/18/24 0559  NA 136 138 135  K 4.4 4.9 4.7  CL 93* 95* 93*  CO2 26 24 24   GLUCOSE 84 88 100*  BUN 43* 46* 36*  CREATININE 8.45* 9.12* 8.00*  CALCIUM  9.3 9.3 9.3  PHOS 4.6 4.9* 4.4   Liver Function Tests: Recent Labs  Lab 05/14/24 0930 05/15/24 1236 05/16/24 0205 05/18/24 0559  AST 20  --   --   --   ALT 11  --   --   --   ALKPHOS 65  --   --   --   BILITOT 1.0  --   --   --   PROT 7.0  --   --   --   ALBUMIN 3.3* 3.4* 3.3* 3.4*   CBC: Recent Labs  Lab 05/14/24 0930 05/15/24 1236 05/16/24 0205 05/18/24 0559  WBC 4.3 3.8* 4.8 5.0  HGB 10.1* 10.5* 10.3* 11.2*  HCT 31.1* 31.9* 31.2* 34.5*  MCV 95.1 93.5 93.7 94.5  PLT 88* 87* 82* 102*   Blood Culture    Component Value Date/Time   SDES BLOOD RIGHT ARM 05/15/2024 1236   SPECREQUEST  05/15/2024 1236    BOTTLES DRAWN AEROBIC ONLY Blood Culture  adequate volume   CULT  05/15/2024 1236    NO GROWTH 4 DAYS Performed at [facility], [address] [place], [place] [address]    REPTSTATUS PENDING 05/15/2024 1236    Medications:  cefTRIAXone  (ROCEPHIN )  IV 1 g (05/19/24 1157)   [START ON 05/20/2024] cefTRIAXone  (ROCEPHIN )  IV      acetaminophen   1,000 mg Oral Q6H   Or   acetaminophen   650 mg Rectal Q6H   amiodarone   200 mg Oral Daily   aspirin  EC  81 mg Oral QHS   calcium  acetate  667 mg Oral TID with meals   Chlorhexidine  Gluconate Cloth  6 each Topical Q0600   clopidogrel   75 mg Oral Daily   diclofenac  Sodium  4 g Topical QID   divalproex   125 mg Oral Q1200   guaiFENesin   200 mg Oral Q6H   heparin   5,000 Units Subcutaneous Q8H   lidocaine   2 patch Transdermal Q24H   losartan   100 mg Oral QHS   melatonin  3  mg Oral QHS   memantine   5 mg Oral BID   spironolactone   50 mg Oral Daily    Dialysis Orders: MWF [place] 3h  B450  79.8kg   AVG    Heparin  none Last OP HD 11/14 Friday, post wt 80.0kg, only stayed on 1 min Signing off sig early the past 1-2 wks, 2- 3hrs  Last Hb 11.3 on 11/12 Last mircera 30mg  on 10/13   Assessment/Plan: Acute encephalopathy. Multifactorial. No acute findings on Head CT. EEG consistent with chronic issues.  Concern for polypharmacy. Sedating meds held. Missed dialysis prior to admit. Considering MRI. Follow.  ESRD. HD MWF. Due to Thanksgiving Holiday schedule, he will have dialysis on Sunday, Tuesday and Friday this week.  HTN. BP acceptable. On losartan  100, spironolactone  50. Follow trends.  Volume. Appears euvolemic. Minimal UF with HD  Anemia. Hgb 10-12. No ESA needs currently. Follow.  2HPTH. Ca/Phos in goal. Follow trends.  Continue home meds. Dementia at baseline  SNF resident  Nutrition - on dysphagia diet  Manuelita Labella, PA-C [facility] 05/19/2024,2:24 PM  LOS: 4 days

## 2024-05-20 DIAGNOSIS — F0150 Vascular dementia without behavioral disturbance: Secondary | ICD-10-CM | POA: Diagnosis not present

## 2024-05-20 DIAGNOSIS — J189 Pneumonia, unspecified organism: Secondary | ICD-10-CM | POA: Diagnosis not present

## 2024-05-20 DIAGNOSIS — G9340 Encephalopathy, unspecified: Secondary | ICD-10-CM | POA: Diagnosis not present

## 2024-05-20 DIAGNOSIS — I120 Hypertensive chronic kidney disease with stage 5 chronic kidney disease or end stage renal disease: Secondary | ICD-10-CM | POA: Diagnosis not present

## 2024-05-20 LAB — BASIC METABOLIC PANEL WITH GFR
Anion gap: 15 (ref 5–15)
BUN: 31 mg/dL — ABNORMAL HIGH (ref 8–23)
CO2: 26 mmol/L (ref 22–32)
Calcium: 9.6 mg/dL (ref 8.9–10.3)
Chloride: 93 mmol/L — ABNORMAL LOW (ref 98–111)
Creatinine, Ser: 7.61 mg/dL — ABNORMAL HIGH (ref 0.61–1.24)
GFR, Estimated: 7 mL/min — ABNORMAL LOW (ref >60)
Glucose, Bld: 92 mg/dL (ref 70–99)
Potassium: 4.3 mmol/L (ref 3.5–5.1)
Sodium: 134 mmol/L — ABNORMAL LOW (ref 135–145)

## 2024-05-20 LAB — RENAL FUNCTION PANEL
Albumin: 3.1 g/dL — ABNORMAL LOW (ref 3.5–5.0)
Anion gap: 16 — ABNORMAL HIGH (ref 5–15)
BUN: 31 mg/dL — ABNORMAL HIGH (ref 8–23)
CO2: 26 mmol/L (ref 22–32)
Calcium: 9.5 mg/dL (ref 8.9–10.3)
Chloride: 92 mmol/L — ABNORMAL LOW (ref 98–111)
Creatinine, Ser: 7.68 mg/dL — ABNORMAL HIGH (ref 0.61–1.24)
GFR, Estimated: 7 mL/min — ABNORMAL LOW (ref >60)
Glucose, Bld: 85 mg/dL (ref 70–99)
Phosphorus: 4.2 mg/dL (ref 2.5–4.6)
Potassium: 4.3 mmol/L (ref 3.5–5.1)
Sodium: 134 mmol/L — ABNORMAL LOW (ref 135–145)

## 2024-05-20 LAB — CBC
HCT: 33.3 % — ABNORMAL LOW (ref 39.0–52.0)
Hemoglobin: 11.2 g/dL — ABNORMAL LOW (ref 13.0–17.0)
MCH: 31.4 pg (ref 26.0–34.0)
MCHC: 33.6 g/dL (ref 30.0–36.0)
MCV: 93.3 fL (ref 80.0–100.0)
Platelets: 112 K/uL — ABNORMAL LOW (ref 150–400)
RBC: 3.57 MIL/uL — ABNORMAL LOW (ref 4.22–5.81)
RDW: 14.3 % (ref 11.5–15.5)
WBC: 5.3 K/uL (ref 4.0–10.5)
nRBC: 0.8 % — ABNORMAL HIGH (ref 0.0–0.2)

## 2024-05-20 LAB — GLUCOSE, CAPILLARY
Glucose-Capillary: 75 mg/dL (ref 70–99)
Glucose-Capillary: 73 mg/dL (ref 70–99)

## 2024-05-20 MED ORDER — LOSARTAN POTASSIUM 100 MG PO TABS: 100.0000 mg | ORAL_TABLET | Freq: Every day | ORAL | Status: AC

## 2024-05-20 MED ORDER — ALTEPLASE 2 MG IJ SOLR: 2.0000 mg | Freq: Once | INTRAMUSCULAR | Status: DC | PRN

## 2024-05-20 MED ORDER — MEMANTINE HCL 5 MG PO TABS: 5.0000 mg | ORAL_TABLET | Freq: Two times a day (BID) | ORAL | Status: DC

## 2024-05-20 MED ORDER — CEFPODOXIME PROXETIL 200 MG PO TABS: 200.0000 mg | ORAL_TABLET | Freq: Two times a day (BID) | ORAL | Status: DC

## 2024-05-20 MED ORDER — HEPARIN SODIUM (PORCINE) 1000 UNIT/ML DIALYSIS
1000.0000 [IU] | INTRAMUSCULAR | Status: DC | PRN
Start: 2024-05-20 — End: 2024-05-20

## 2024-05-20 MED ORDER — CEFPODOXIME PROXETIL 200 MG PO TABS: 200.0000 mg | ORAL_TABLET | Freq: Two times a day (BID) | ORAL | Status: AC

## 2024-05-20 MED ORDER — LOSARTAN POTASSIUM 100 MG PO TABS: 100.0000 mg | ORAL_TABLET | Freq: Every day | ORAL | Status: DC

## 2024-05-20 MED ORDER — LIDOCAINE-PRILOCAINE 2.5-2.5 % EX CREA
1.0000 | TOPICAL_CREAM | CUTANEOUS | Status: DC | PRN
Start: 2024-05-20 — End: 2024-05-20

## 2024-05-20 MED ORDER — PENTAFLUOROPROP-TETRAFLUOROETH EX AERO: 1.0000 | INHALATION_SPRAY | CUTANEOUS | Status: DC | PRN

## 2024-05-20 MED ORDER — ANTICOAGULANT SODIUM CITRATE 4% (200MG/5ML) IV SOLN: 5.0000 mL | Status: DC | PRN

## 2024-05-20 MED ORDER — LIDOCAINE HCL (PF) 1 % IJ SOLN: 5.0000 mL | INTRAMUSCULAR | Status: DC | PRN

## 2024-05-20 NOTE — Progress Notes (Signed)
 Called [facility] pt to be admitted to rm 301, report given to Lacretia Seip, LPN

## 2024-05-20 NOTE — Progress Notes (Signed)
 [facility] Progress Note   Subjective:   Patient seen and examined at bedside in dialysis.  Tolerating dialysis.  Sleeping now.  Does not wake to verbal stimuli.   Objective Vitals:   05/20/24 0435 05/20/24 0730 05/20/24 0746 05/20/24 0800  BP: 139/77 (!) 145/72 (!) 141/75 91/78  Pulse: 78 71 70 71  Resp: 16 13 14 20   Temp: 98.2 F (36.8 C) 97.9 F (36.6 C)    TempSrc:      SpO2: 100% 100% 95% 91%  Weight:  77.3 kg    Height:       Physical Exam General:chronically ill appearing male in NAD, sleeping Heart:RRR Lungs:CTAB anteriorly Abdomen:soft, NTND Extremities:no LE edema Dialysis Access: LU AVF in use   Filed Weights   05/18/24 1730 05/18/24 2108 05/20/24 0730  Weight: 80.1 kg 80.1 kg 77.3 kg   No intake or output data in the 24 hours ending 05/20/24 0920  Additional Objective Labs: Basic Metabolic Panel: Recent Labs  Lab 05/16/24 0205 05/18/24 0559 05/20/24 0500 05/20/24 0647  NA 138 135 134* 134*  K 4.9 4.7 4.3 4.3  CL 95* 93* 93* 92*  CO2 24 24 26 26   GLUCOSE 88 100* 92 85  BUN 46* 36* 31* 31*  CREATININE 9.12* 8.00* 7.61* 7.68*  CALCIUM  9.3 9.3 9.6 9.5  PHOS 4.9* 4.4  --  4.2   Liver Function Tests: Recent Labs  Lab 05/14/24 0930 05/15/24 1236 05/16/24 0205 05/18/24 0559 05/20/24 0647  AST 20  --   --   --   --   ALT 11  --   --   --   --   ALKPHOS 65  --   --   --   --   BILITOT 1.0  --   --   --   --   PROT 7.0  --   --   --   --   ALBUMIN 3.3*   < > 3.3* 3.4* 3.1*   < > = values in this interval not displayed.   CBC: Recent Labs  Lab 05/14/24 0930 05/15/24 1236 05/16/24 0205 05/18/24 0559 05/20/24 0500  WBC 4.3 3.8* 4.8 5.0 5.3  HGB 10.1* 10.5* 10.3* 11.2* 11.2*  HCT 31.1* 31.9* 31.2* 34.5* 33.3*  MCV 95.1 93.5 93.7 94.5 93.3  PLT 88* 87* 82* 102* 112*   Blood Culture    Component Value Date/Time   SDES BLOOD RIGHT ARM 05/15/2024 1236   SPECREQUEST  05/15/2024 1236    BOTTLES DRAWN AEROBIC ONLY Blood  Culture adequate volume   CULT  05/15/2024 1236    NO GROWTH 4 DAYS Performed at [facility], [address], [place], [place] [address]    REPTSTATUS PENDING 05/15/2024 1236    CBG: Recent Labs  Lab 05/18/24 1549 05/18/24 2205 05/19/24 0723 05/19/24 1550 05/19/24 1948  GLUCAP 115* 78 79 114* 126*    Medications:  anticoagulant sodium citrate      cefTRIAXone  (ROCEPHIN )  IV Stopped (05/19/24 1226)   cefTRIAXone  (ROCEPHIN )  IV      acetaminophen   1,000 mg Oral Q6H   Or   acetaminophen   650 mg Rectal Q6H   amiodarone   200 mg Oral Daily   aspirin  EC  81 mg Oral QHS   calcium  acetate  667 mg Oral TID with meals   Chlorhexidine  Gluconate Cloth  6 each Topical Q0600   Chlorhexidine  Gluconate Cloth  6 each Topical Q0600   clopidogrel   75 mg Oral Daily  diclofenac  Sodium  4 g Topical QID   divalproex   125 mg Oral Q1200   guaiFENesin   200 mg Oral Q6H   heparin   5,000 Units Subcutaneous Q8H   lidocaine   2 patch Transdermal Q24H   losartan   100 mg Oral QHS   melatonin  3 mg Oral QHS   memantine   5 mg Oral BID   spironolactone   50 mg Oral Daily    Dialysis Orders: MWF [place] 3h  B450  79.8kg   AVG    Heparin  none Last OP HD 11/14 Friday, post wt 80.0kg, only stayed on 1 min Signing off sig early the past 1-2 wks, 2- 3hrs  Last Hb 11.3 on 11/12 Last mircera 30mg  on 10/13   Assessment/Plan: Acute encephalopathy. Multifactorial. No acute findings on Head CT. EEG consistent with chronic issues.  Concern for polypharmacy. Sedating meds held. Missed dialysis prior to admit. Considering MRI. Follow.  ESRD. HD MWF. Due to Thanksgiving Holiday schedule, he will have dialysis on Sunday, Tuesday and Friday this week. HD today. HTN. BP acceptable. On losartan  100, spironolactone  50. Follow trends.  Volume. Appears euvolemic. Minimal UF with HD  Anemia. Hgb 10-12. No ESA needs currently. Follow.  2HPTH. Ca/Phos in goal. Follow trends.  Continue home meds. Dementia at  baseline  SNF resident  Nutrition - on dysphagia diet  Manuelita Labella, PA-C [facility] 05/20/2024,9:20 AM  LOS: 5 days

## 2024-05-20 NOTE — Discharge Summary (Signed)
 Name: Gregory Hood MRN: [id number] DOB: Nov 09, 1950 73 y.o. PCP: Valma Carwin, MD  Date of Admission: 05/14/2024  8:34 AM Date of Discharge: 05/20/2024 Attending Physician: Dr. Shawn  Discharge Diagnosis: Principal Problem:   Altered mental status Active Problems:   Essential hypertension, benign   DM (diabetes mellitus), type 2 with renal complications (HCC)   End stage renal disease (HCC)   BPH (benign prostatic hyperplasia)   HFrEF (heart failure with reduced ejection fraction) (HCC)   SSS (sick sinus syndrome) (HCC)   Polypharmacy   Hypoglycemia   Vascular dementia (HCC)   ESRD on dialysis (HCC)   AMS (altered mental status)   Community acquired pneumonia    Discharge Medications: Allergies as of 05/20/2024       Reactions   Bee Venom Swelling   Ace Inhibitors Cough   Lexiscan  [regadenoson ] Other (See Comments)   Seizure like-activity after lexiscan  given.   Statins Other (See Comments)   Other reaction(s): Other (See Comments) Myalgias and CK increase.  Tried both atorvastatin  and rosuvastatin         Medication List     STOP taking these medications    carvedilol  3.125 MG tablet Commonly known as: COREG    cyanocobalamin  1000 MCG/ML injection Commonly known as: VITAMIN B12   EPINEPHrine  0.3 mg/0.3 mL Soaj injection Commonly known as: EPI-PEN   ezetimibe  10 MG tablet Commonly known as: ZETIA    folic acid  1 MG tablet Commonly known as: FOLVITE    furosemide  40 MG tablet Commonly known as: LASIX    Insulin  Aspart FlexPen 100 UNIT/ML Commonly known as: NOVOLOG    Iron 325 (65 Fe) MG Tabs   losartan -hydrochlorothiazide  100-12.5 MG tablet Commonly known as: HYZAAR   multivitamin Tabs tablet   polyethylene glycol 17 g packet Commonly known as: MIRALAX  / GLYCOLAX    Red Yeast Rice 600 MG Caps   terbinafine 250 MG tablet Commonly known as: LAMISIL   traMADol  50 MG tablet Commonly known as: ULTRAM    traZODone 150 MG tablet Commonly known  as: DESYREL       TAKE these medications    acetaminophen  500 MG tablet Commonly known as: TYLENOL  Take 500 mg by mouth daily.   amiodarone  200 MG tablet Commonly known as: PACERONE  Take 200 mg by mouth daily.   aspirin  81 MG tablet Take 1 tablet (81 mg total) by mouth at bedtime.   atropine  1 % ophthalmic solution Place 1 drop into both eyes.   bisacodyl  10 MG suppository Commonly known as: DULCOLAX Place 10 mg rectally as needed for moderate constipation.   calcium  acetate 667 MG capsule Commonly known as: PHOSLO  Take 667 mg by mouth 3 (three) times daily.   cefpodoxime  200 MG tablet Commonly known as: VANTIN  Take 1 tablet (200 mg total) by mouth 2 (two) times daily for 2 days.   clopidogrel  75 MG tablet Commonly known as: PLAVIX  Take 75 mg by mouth daily.   divalproex  125 MG DR tablet Commonly known as: DEPAKOTE  Take 125 mg by mouth daily at 12 noon.   finasteride  5 MG tablet Commonly known as: PROSCAR  Take 5 mg by mouth daily.   GLUCAGON EMERGENCY IJ Inject 1 mg into the muscle as needed (blood sugar less than 70).   Glucose 77.4 % Gel Take 1 Dose by mouth as needed (blood glucose less than 70).   losartan  100 MG tablet Commonly known as: COZAAR  Take 1 tablet (100 mg total) by mouth at bedtime. What changed:  medication strength how much to  take when to take this   melatonin 3 MG Tabs tablet Take 3 mg by mouth at bedtime.   memantine  5 MG tablet Commonly known as: NAMENDA  Take 1 tablet (5 mg total) by mouth 2 (two) times daily. What changed:  medication strength how much to take   spironolactone  50 MG tablet Commonly known as: ALDACTONE  Take 1 tablet by mouth daily.        Disposition and follow-up:   Gregory Hood was discharged from [facility] in Stable condition.  At the hospital follow up visit please address:  1.  Follow-up:  a.  AMS Please follow-up on his mental status.  Workup here was unremarkable,  think this is a progression of his vascular dementia.  Decreased dose of memantine  to 5 mg twice daily based on his GFR. -Family declined palliative outpatient consult.   b.  Hypoglycemia Intermittent hypoglycemia requiring D50.  Follow-up on his appetite.  C.  Polypharmacy Discontinued trazodone and tramadol , and other vitamins that may not be useful for him at this time.  2.  Labs / imaging needed at time of follow-up: None  3.  Pending labs/ test needing follow-up: None    Follow-up Appointments: -Follow-up with PCP  Hospital Course  Acute Encephalopathy  Patient with vascular dementia presented with 2-3 days of worsening confusion and lethargy. CT head was negative for acute pathology, and there were no infectious signs or leukocytosis. EEG showed no seizures. Polypharmacy was suspected; trazodone and tramadol  were discontinued, and memantine  was reduced to 5 mg BID based on renal function, though mentation did not improve. Encephalopathy was ultimately attributed to progression of underlying vascular dementia.  Pneumonia: Mild cough with chest X-ray showing a slight interval increase in consolidation. Treated with ceftriaxone  and azithromycin  during hospitalization; transitioned to complete 2 additional days of cefpodoxime .  ESRD on Hemodialysis (MWF): He had missed his prior HD session and presented with elevated BUN/Cr, mild hyperkalemia, and uremia. He received urgent dialysis on admission and resumed MWF treatments. Mental status did not improve with clearance of uremia.  Hypoglycemia: Intermittent episodes treated with IV D50; blood glucose remained stable at discharge.  Hypertension: Losartan /HCTZ was discontinued, and losartan  was increased to 100 mg daily. Spironolactone  50 mg daily continued.  Atrial Fibrillation / Sick Sinus Syndrome s/p Pacemaker: Hemodynamically stable. Continues amiodarone  200 mg for rhythm control. Not on rate control since carvedilol  was  discontinued in 10/2023.  Polypharmacy: Medications contributing to sedation or not beneficial in ESRD were discontinued, including losartan /HCTZ, finasteride , multivitamins, vitamin C, folic acid , red yeast rice, tramadol , and trazodone. Memantine  was reduced to 5 mg BID. Wife updated on all changes.   Discharge Subjective: Patient evaluated at bedside this AM, seen in dialysis.  No new concerns.  Discharge Exam:   BP (!) (P) 154/89 (BP Location: Left Arm)   Pulse (P) 70   Temp (!) 97.4 F (36.3 C)   Resp 19   Ht 5' 9 (1.753 m)   Wt 76.4 kg   SpO2 (P) 100%   BMI 24.87 kg/m   HENT: Normocephalic and atraumatic; mucous membranes moist. Eyes: Left eye blind with opaque pupil; right pupil constricted; conjunctivae non-erythematous. Neck: Supple; no meningismus. Cardiovascular: RRR, no murmurs.   Pulmonary/Chest: Normal work of breathing on room air; lungs clear to auscultation except mild rales in left lower side.. Abdomen: Soft, non-tender, non-distended; no guarding or rebound. MSK: Chronic generalized weakness, poor muscle bulk.   Neurological:Patient was; moves all extremities spontaneously; no new focal  deficits appreciated. Skin: Warm and dry; no rashes or lesions. Psych: Flat affect; limited engagement.    Pertinent Labs, Studies, and Procedures:     Latest Ref Rng & Units 05/20/2024    5:00 AM 05/18/2024    5:59 AM 05/16/2024    2:05 AM  CBC  WBC 4.0 - 10.5 K/uL 5.3  5.0  4.8   Hemoglobin 13.0 - 17.0 g/dL 88.7  88.7  89.6   Hematocrit 39.0 - 52.0 % 33.3  34.5  31.2   Platelets 150 - 400 K/uL 112  102  82        Latest Ref Rng & Units 05/20/2024    6:47 AM 05/20/2024    5:00 AM 05/18/2024    5:59 AM  CMP  Glucose 70 - 99 mg/dL 85  92  899   BUN 8 - 23 mg/dL 31  31  36   Creatinine 0.61 - 1.24 mg/dL 2.31  2.38  1.99   Sodium 135 - 145 mmol/L 134  134  135   Potassium 3.5 - 5.1 mmol/L 4.3  4.3  4.7   Chloride 98 - 111 mmol/L 92  93  93   CO2 22 - 32 mmol/L  26  26  24    Calcium  8.9 - 10.3 mg/dL 9.5  9.6  9.3     DG Chest 2 View Result Date: 05/14/2024 EXAM: 2 VIEW(S) XRAY OF THE CHEST 05/14/2024 12:59:00 PM COMPARISON: 11/11/2023 CLINICAL HISTORY: weakness FINDINGS: LINES, TUBES AND DEVICES: Dual lead pacer with leads at right atrium and right ventricle. LUNGS AND PLEURA: Left greater than right basilar airspace disease. Possible small left pleural effusion versus artifact due to overlying soft tissues. No pneumothorax. HEART AND MEDIASTINUM: Moderate cardiomegaly. BONES AND SOFT TISSUES: No acute osseous abnormality. LIMITATIONS/ARTIFACTS: Lateral view degraded by patient arm position and overlying artifact. Frontal AP view mildly degraded by patient body habitus. IMPRESSION: 1. Both views are degraded secondary to positioning and technique as detailed above. 2. Possible small left pleural effusion with left greater than right basilar airspace disease. Although this could all represent atelectasis, cannot exclude left lower lobe pneumonia. 3. Cardiomegaly without congestive heart failure. Electronically signed by: Rockey Kilts MD 05/14/2024 01:55 PM EST RP Workstation: [id number]   CT Head Wo Contrast Result Date: 05/14/2024 EXAM: CT HEAD WITHOUT CONTRAST 05/14/2024 12:43:00 PM TECHNIQUE: CT of the head was performed without the administration of intravenous contrast. Automated exposure control, iterative reconstruction, and/or weight based adjustment of the mA/kV was utilized to reduce the radiation dose to as low as reasonably achievable. COMPARISON: CT Head 06/01/23 CLINICAL HISTORY: Mental status change, unknown cause FINDINGS: BRAIN AND VENTRICLES: No acute hemorrhage. No evidence of acute infarct. No hydrocephalus. No extra-axial collection. No mass effect or midline shift. Cerebral atrophy. Patchy white matter hypodensities, compatible with chronic microvascular ischemic changes. 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  ORBITS: No acute abnormality. SINUSES: No acute abnormality. SOFT TISSUES AND SKULL: No acute soft tissue abnormality. No skull fracture. IMPRESSION: 1. No acute intracranial abnormality. 2. Cerebral atrophy and chronic microvascular ischemic change. Electronically signed by: Gilmore Molt MD 05/14/2024 01:01 PM EST RP Workstation: [id number]     Discharge Instructions: Discharge Instructions     Call MD for:  difficulty breathing, headache or visual disturbances   Complete by: As directed    Diet - low sodium heart healthy   Complete by: As directed    Discharge instructions   Complete by: As directed    -  Follow-up with PCP   Increase activity slowly   Complete by: As directed        Signed: Celestina Czar, MD 05/20/2024, 2:06 PM   Pager: [phone]

## 2024-05-20 NOTE — Plan of Care (Signed)

## 2024-05-20 NOTE — Discharge Planning (Signed)
 [facility] Patient Discharge Orders- [facility] CLINIC: [place]  Patient's name: Gregory Hood Admit/DC Dates: 05/14/2024 - 05/20/24  Discharge Diagnoses: AMS thought to be 2/2 vascular dementia, work up unremarkable.  Hypoglycemia Polypharmacy   HD ORDER CHANGES: Heparin  change: no EDW Change: no Bath Change: no       ANEMIA MANAGEMENT: Aranesp : Given: no    PRBC's Given: no  ESA dose for discharge: no change IV Iron dose at discharge: no change   BONE/MINERAL MEDICATIONS: Hectorol/Calcitriol  change: no Sensipar/Parsabiv change: no   ACCESS INTERVENTION/CHANGE: no Details:   RECENT LABS: Recent Labs  Lab 05/20/24 0647  K 4.3  CALCIUM  9.5  ALBUMIN 3.1*  PHOS 4.2   Recent Labs  Lab 05/20/24 0500  HGB 11.2*   Blood Culture    Component Value Date/Time   SDES BLOOD RIGHT ARM 05/15/2024 1236   SPECREQUEST  05/15/2024 1236    BOTTLES DRAWN AEROBIC ONLY Blood Culture adequate volume   CULT  05/15/2024 1236    NO GROWTH 4 DAYS Performed at [facility], [address], [place], [place] [address]    REPTSTATUS PENDING 05/15/2024 1236       IV ANTIBIOTICS: none Details:   OTHER ANTICOAGULATION:  On Eliquis: no On Coumadin: no   OTHER/APPTS/LAB ORDERS:     D/C Meds to be reconciled by nurse after every discharge.  Completed By: Manuelita Labella PA-C   Reviewed by: MD:______ RN_______

## 2024-05-20 NOTE — TOC Transition Note (Signed)
 Transition of Care [facility]) - Discharge Note   Patient Details  Name: Gregory Hood MRN: [id number] Date of Birth: 1951/04/12  Transition of [facility]) CM/SW Contact:  Gwenn Julien [place], [place] Phone Number: 05/20/2024, 2:30 PM   Clinical Narrative: Pt for dc back to [facility] where he is a LTC/SNF resident. Spoke to Lauren in admissions who confirmed they are prepared to admit pt to room 301. Unable to reach pt's wife, left VM. Spoke to pt's son Gregory Hood who reports agreeable with dc plan. RN provided with number for report and PTAR arranged for transport. SW signing off at dc.   Julien Gwenn, MSW, LCSW [phone] (coverage)        Final next level of care: Skilled Nursing Facility Barriers to Discharge: Barriers Resolved   Patient Goals and CMS Choice            Discharge Placement              Patient chooses bed at: Other - please specify in the comment section below: ([facility]) Patient to be transferred to facility by: PTAR Name of family member notified: Gregory Hood/son Patient and family notified of of transfer: 05/20/24  Discharge Plan and Services Additional resources added to the After Visit Summary for                                       Social Drivers of Health (SDOH) Interventions SDOH Screenings   Food Insecurity: Patient Unable To Answer (05/15/2024)  Housing: Low Risk  (05/15/2024)  Transportation Needs: No Transportation Needs (05/15/2024)  Utilities: Not At Risk (05/15/2024)  Social Connections: Unknown (05/15/2024)  Tobacco Use: Medium Risk (05/14/2024)     Readmission Risk Interventions     No data to display

## 2024-05-20 NOTE — Progress Notes (Signed)
 OT Cancellation Note  Patient Details Name: Gregory Hood MRN: [id number] DOB: 1951-02-20   Cancelled Treatment:    Reason Eval/Treat Not Completed: Other (comment) (Dialysis)   Elliett Guarisco M. Bur[place], OTR/L [facility] [phone] Secure Chat Preferred  Rikki Burma 05/20/2024, 7:47 AM

## 2024-05-21 LAB — CULTURE, BLOOD (ROUTINE X 2)
Culture: NO GROWTH
Special Requests: ADEQUATE

## 2024-05-21 NOTE — Progress Notes (Signed)
 Late note entry 11/24 0945 am  D/c over weekend noted. Contacted [facility] to inform of pt d/c and anticipated arrival back this am. No further support needed.   Lavanda Oluwatosin Bracy Dialysis Navigator [phone]

## 2024-06-04 ENCOUNTER — Emergency Department (HOSPITAL_COMMUNITY)

## 2024-06-04 ENCOUNTER — Encounter (HOSPITAL_COMMUNITY): Payer: Self-pay

## 2024-06-04 ENCOUNTER — Observation Stay (HOSPITAL_COMMUNITY)
Admission: EM | Admit: 2024-06-04 | Discharge: 2024-06-08 | DRG: 637 | Disposition: A | Discharge status: Skilled Nursing Facility | Attending: Emergency Medicine | Admitting: Emergency Medicine

## 2024-06-04 DIAGNOSIS — I6200 Nontraumatic subdural hemorrhage, unspecified: Secondary | ICD-10-CM | POA: Insufficient documentation

## 2024-06-04 DIAGNOSIS — E162 Hypoglycemia, unspecified: Secondary | ICD-10-CM | POA: Diagnosis present

## 2024-06-04 DIAGNOSIS — N186 End stage renal disease: Secondary | ICD-10-CM

## 2024-06-04 DIAGNOSIS — S069XAA Unspecified intracranial injury with loss of consciousness status unknown, initial encounter: Secondary | ICD-10-CM

## 2024-06-04 DIAGNOSIS — Z992 Dependence on renal dialysis: Secondary | ICD-10-CM

## 2024-06-04 DIAGNOSIS — R4182 Altered mental status, unspecified: Secondary | ICD-10-CM | POA: Diagnosis present

## 2024-06-04 DIAGNOSIS — S065XAA Traumatic subdural hemorrhage with loss of consciousness status unknown, initial encounter: Principal | ICD-10-CM

## 2024-06-04 LAB — CBC WITH DIFFERENTIAL/PLATELET
Abs Immature Granulocytes: 0.02 K/uL (ref 0.00–0.07)
Basophils Absolute: 0.1 K/uL (ref 0.0–0.1)
Basophils Relative: 1 %
Eosinophils Absolute: 0.1 K/uL (ref 0.0–0.5)
Eosinophils Relative: 1 %
HCT: 29.3 % — ABNORMAL LOW (ref 39.0–52.0)
Hemoglobin: 9.5 g/dL — ABNORMAL LOW (ref 13.0–17.0)
Immature Granulocytes: 0 %
Lymphocytes Relative: 11 %
Lymphs Abs: 0.7 K/uL (ref 0.7–4.0)
MCH: 31.1 pg (ref 26.0–34.0)
MCHC: 32.4 g/dL (ref 30.0–36.0)
MCV: 96.1 fL (ref 80.0–100.0)
Monocytes Absolute: 0.5 K/uL (ref 0.1–1.0)
Monocytes Relative: 8 %
Neutro Abs: 5.4 K/uL (ref 1.7–7.7)
Neutrophils Relative %: 79 %
Platelets: 108 K/uL — ABNORMAL LOW (ref 150–400)
RBC: 3.05 MIL/uL — ABNORMAL LOW (ref 4.22–5.81)
RDW: 14.6 % (ref 11.5–15.5)
WBC: 6.8 K/uL (ref 4.0–10.5)
nRBC: 0 % (ref 0.0–0.2)

## 2024-06-04 LAB — COMPREHENSIVE METABOLIC PANEL WITH GFR
ALT: 16 U/L (ref 0–44)
AST: 25 U/L (ref 15–41)
Albumin: 3.1 g/dL — ABNORMAL LOW (ref 3.5–5.0)
Alkaline Phosphatase: 66 U/L (ref 38–126)
Anion gap: 15 (ref 5–15)
BUN: 44 mg/dL — ABNORMAL HIGH (ref 8–23)
CO2: 28 mmol/L (ref 22–32)
Calcium: 8.9 mg/dL (ref 8.9–10.3)
Chloride: 94 mmol/L — ABNORMAL LOW (ref 98–111)
Creatinine, Ser: 9.75 mg/dL — ABNORMAL HIGH (ref 0.61–1.24)
GFR, Estimated: 5 mL/min — ABNORMAL LOW (ref >60)
Glucose, Bld: 190 mg/dL — ABNORMAL HIGH (ref 70–99)
Potassium: 4.5 mmol/L (ref 3.5–5.1)
Sodium: 137 mmol/L (ref 135–145)
Total Bilirubin: 1.2 mg/dL (ref 0.0–1.2)
Total Protein: 6.4 g/dL — ABNORMAL LOW (ref 6.5–8.1)

## 2024-06-04 LAB — CBG MONITORING, ED
Glucose-Capillary: 120 mg/dL — ABNORMAL HIGH (ref 70–99)
Glucose-Capillary: 148 mg/dL — ABNORMAL HIGH (ref 70–99)
Glucose-Capillary: 193 mg/dL — ABNORMAL HIGH (ref 70–99)
Glucose-Capillary: 88 mg/dL (ref 70–99)

## 2024-06-04 LAB — GLUCOSE, CAPILLARY
Glucose-Capillary: 126 mg/dL — ABNORMAL HIGH (ref 70–99)
Glucose-Capillary: 55 mg/dL — ABNORMAL LOW (ref 70–99)
Glucose-Capillary: 71 mg/dL (ref 70–99)
Glucose-Capillary: 79 mg/dL (ref 70–99)
Glucose-Capillary: 85 mg/dL (ref 70–99)

## 2024-06-04 LAB — PROTIME-INR
INR: 1.4 — ABNORMAL HIGH (ref 0.8–1.2)
Prothrombin Time: 17.7 s — ABNORMAL HIGH (ref 11.4–15.2)

## 2024-06-04 LAB — I-STAT CG4 LACTIC ACID, ED: Lactic Acid, Venous: 1.3 mmol/L (ref 0.5–1.9)

## 2024-06-04 MED ORDER — ACETAMINOPHEN 500 MG PO TABS
500.0000 mg | ORAL_TABLET | Freq: Every day | ORAL | Status: DC
  Administered 2024-06-05 – 2024-06-08 (×4): 500 mg via ORAL
  Filled 2024-06-04 (×4): qty 1

## 2024-06-04 MED ORDER — MELATONIN 3 MG PO TABS
3.0000 mg | ORAL_TABLET | Freq: Every day | ORAL | Status: DC
  Administered 2024-06-05 – 2024-06-07 (×3): 3 mg via ORAL
  Filled 2024-06-04 (×3): qty 1

## 2024-06-04 MED ORDER — DIVALPROEX SODIUM 125 MG PO DR TAB
125.0000 mg | DELAYED_RELEASE_TABLET | Freq: Every day | ORAL | Status: DC
  Administered 2024-06-05 – 2024-06-08 (×3): 125 mg via ORAL
  Filled 2024-06-04 (×4): qty 1

## 2024-06-04 MED ORDER — DEXTROSE 50 % IV SOLN
INTRAVENOUS | Status: AC
  Filled 2024-06-04: qty 50

## 2024-06-04 MED ORDER — FINASTERIDE 5 MG PO TABS
5.0000 mg | ORAL_TABLET | Freq: Every day | ORAL | Status: DC
  Administered 2024-06-05: 5 mg via ORAL
  Filled 2024-06-04: qty 1

## 2024-06-04 MED ORDER — CHLORHEXIDINE GLUCONATE CLOTH 2 % EX PADS
6.0000 | MEDICATED_PAD | Freq: Every day | CUTANEOUS | Status: DC
  Administered 2024-06-05 – 2024-06-07 (×3): 6 via TOPICAL

## 2024-06-04 MED ORDER — SENNA 8.6 MG PO TABS
1.0000 | ORAL_TABLET | Freq: Two times a day (BID) | ORAL | Status: DC
  Administered 2024-06-05 – 2024-06-08 (×7): 8.6 mg via ORAL
  Filled 2024-06-04 (×7): qty 1

## 2024-06-04 MED ORDER — AMIODARONE HCL 200 MG PO TABS
200.0000 mg | ORAL_TABLET | Freq: Every day | ORAL | Status: DC
  Administered 2024-06-05 – 2024-06-07 (×3): 200 mg via ORAL
  Filled 2024-06-04 (×4): qty 1

## 2024-06-04 MED ORDER — DEXTROSE 50 % IV SOLN
12.5000 g | INTRAVENOUS | Status: AC
  Administered 2024-06-04: 12.5 g via INTRAVENOUS

## 2024-06-04 MED ORDER — DEXTROSE IN LACTATED RINGERS 5 % IV SOLN: INTRAVENOUS | Status: DC

## 2024-06-04 MED ORDER — BISACODYL 10 MG RE SUPP
10.0000 mg | RECTAL | Status: DC | PRN
  Administered 2024-06-06: 10 mg via RECTAL
  Filled 2024-06-04: qty 1

## 2024-06-04 NOTE — Hospital Course (Addendum)
 On Friday he was woken early for dialysis from his nursing home. Sitting in a chair, he fell out and struck his head and was sent to [facility]. He was bleeding. CT scan there showed   After most recent discharge form [facility], he was doing well at his facility.  Today he is sleepy, and sleepy liike he is today since he fell.  At his baseline, he recognizes close family and is oriented to place when in his nursing home. Baseline, AO to self only.  He eats but does not drink fluid much.  Missed dialysis on Friday  Laceration forehead.  CT on Friday was WNL.   Acetaminophen  Amiodarone  Aspirin  Bisacodyl  Calcium  Cefpodoxime  Clopidogrel  Depakote  Finasteride  Losartan  Melatonin Memantine  Spiro  Recently stopped Coreg  B12 Zetia  Folic acid  Lasix  Iron Hyzaar Terbinadine Tramadol  Trazodone 

## 2024-06-04 NOTE — Progress Notes (Signed)
 Patient ID: Gregory Hood, male   DOB: 1950-12-03, 73 y.o.   MRN: [id number] BP 104/74   Pulse 70   Resp 13   SpO2 95%  Films reviewed. Exceedingly small amount of parafalcine blood. There is no indication for operative intervention. The basal cisterns are widely patent, ventricles not effaced. Cerebral atrophy with ventriculomegaly ex vacuo.

## 2024-06-04 NOTE — ED Provider Notes (Signed)
 [place] EMERGENCY DEPARTMENT AT [facility] Provider Note   CSN: [id number] Arrival date & time: 06/04/24  1120     Patient presents with: Hypoglycemia   Gregory Hood is a 73 y.o. male.   HPI     73 year old male comes in with chief complaint of altered mental status.  Patient has history of diabetes, CHF, hypertension, multiple strokes and vascular dementia.  He is accompanied by his spouse, who provides significant part of the history.  Patient also has history of ESRD, hemodialysis days are Monday-Wednesday-Friday, last dialysis was last Wednesday.  According to the wife, on Friday, while patient was waiting for dialysis early in the morning, he fell down.  In the process, he struck his head.  He was taken to outside hospital.  He had CT scan of the head and neck, and he was told that there was no bleed, there was significant arthritis.  However, patient has not been the same since then.  Normally patient is jovial, able to carry on conversation, but the entire weekend he just slept all the time.  Today, the blood sugar was low and they also noted significant bruising around both eyes, which got everyone concerned and patient was sent to the emergency room.  Review of system is negative for any new cough, fevers, chills, nausea, vomiting.  Prior to Admission medications   Medication Sig Start Date End Date Taking? Authorizing Provider  acetaminophen  (TYLENOL ) 500 MG tablet Take 500 mg by mouth daily.    [provider]  amiodarone  (PACERONE ) 200 MG tablet Take 200 mg by mouth daily. 04/19/24   [provider]  aspirin  81 MG tablet Take 1 tablet (81 mg total) by mouth at bedtime. 12/24/20   Hongalgi, Anand D, MD  atropine  1 % ophthalmic solution Place 1 drop into both eyes. 10/25/23   [provider]  bisacodyl  (DULCOLAX) 10 MG suppository Place 10 mg rectally as needed for moderate constipation.    [provider]  calcium  acetate  (PHOSLO ) 667 MG capsule Take 667 mg by mouth 3 (three) times daily. 08/29/20   [provider]  clopidogrel  (PLAVIX ) 75 MG tablet Take 75 mg by mouth daily. 03/10/18   [provider]  Dextrose , Diabetic Use, (GLUCOSE) 77.4 % GEL Take 1 Dose by mouth as needed (blood glucose less than 70).    [provider]  divalproex  (DEPAKOTE ) 125 MG DR tablet Take 125 mg by mouth daily at 12 noon.    [provider]  finasteride  (PROSCAR ) 5 MG tablet Take 5 mg by mouth daily. 03/26/19   [provider]  Glucagon, rDNA, (GLUCAGON EMERGENCY IJ) Inject 1 mg into the muscle as needed (blood sugar less than 70).    [provider]  losartan  (COZAAR ) 100 MG tablet Take 1 tablet (100 mg total) by mouth at bedtime. 05/20/24   Koomson, Julius, MD  melatonin 3 MG TABS tablet Take 3 mg by mouth at bedtime. 05/04/23   [provider]  memantine  (NAMENDA ) 5 MG tablet Take 1 tablet (5 mg total) by mouth 2 (two) times daily. 05/20/24   Celestina Czar, MD  spironolactone  (ALDACTONE ) 50 MG tablet Take 1 tablet by mouth daily. 12/16/23   [provider]    Allergies: Bee venom, Ace inhibitors, Lexiscan  [regadenoson ], and Statins    Review of Systems  All other systems reviewed and are negative.   Updated Vital Signs BP 104/74   Pulse 70   Resp 13  SpO2 95%   Physical Exam Vitals and nursing note reviewed.  Constitutional:      Appearance: He is well-developed.     Comments: Somnolent, but easily arousable  HENT:     Head: Atraumatic.  Eyes:     Comments: Pupils are 2 mm, equal. Periorbital ecchymosis noted around both eyes  Cardiovascular:     Rate and Rhythm: Normal rate.  Pulmonary:     Effort: Pulmonary effort is normal.  Musculoskeletal:     Cervical back: Neck supple.  Skin:    General: Skin is warm.  Neurological:     Mental Status: He is disoriented.     Comments: Confused appearing, moving all 4 extremities to noxious stimuli      (all labs ordered are listed, but only abnormal results are displayed) Labs Reviewed  COMPREHENSIVE METABOLIC PANEL WITH GFR - Abnormal; Notable for the following components:      Result Value   Chloride 94 (*)    Glucose, Bld 190 (*)    BUN 44 (*)    Creatinine, Ser 9.75 (*)    Total Protein 6.4 (*)    Albumin 3.1 (*)    GFR, Estimated 5 (*)    All other components within normal limits  CBC WITH DIFFERENTIAL/PLATELET - Abnormal; Notable for the following components:   RBC 3.05 (*)    Hemoglobin 9.5 (*)    HCT 29.3 (*)    Platelets 108 (*)    All other components within normal limits  PROTIME-INR - Abnormal; Notable for the following components:   Prothrombin Time 17.7 (*)    INR 1.4 (*)    All other components within normal limits  CBG MONITORING, ED - Abnormal; Notable for the following components:   Glucose-Capillary 120 (*)    All other components within normal limits  CBG MONITORING, ED - Abnormal; Notable for the following components:   Glucose-Capillary 148 (*)    All other components within normal limits  CBG MONITORING, ED - Abnormal; Notable for the following components:   Glucose-Capillary 193 (*)    All other components within normal limits  CULTURE, BLOOD (ROUTINE X 2)  CULTURE, BLOOD (ROUTINE X 2)  I-STAT CG4 LACTIC ACID, ED  CBG MONITORING, ED  I-STAT CG4 LACTIC ACID, ED  CBG MONITORING, ED    EKG: EKG Interpretation Date/Time:  Monday June 04 2024 12:34:54 EST Ventricular Rate:  70 PR Interval:  152 QRS Duration:  188 QT Interval:  489 QTC Calculation: 528 R Axis:   -73  Text Interpretation: Ventricular-paced rhythm No further analysis attempted due to paced rhythm Confirmed by Charlyn Sora [phone]) on 06/04/2024 3:39:38 PM  Radiology: CT Head Wo Contrast Result Date: 06/04/2024 EXAM: CT HEAD WITHOUT CONTRAST 06/04/2024 01:51:54 PM TECHNIQUE: CT of the head was performed without the administration of intravenous contrast. Automated  exposure control, iterative reconstruction, and/or weight based adjustment of the mA/kV was utilized to reduce the radiation dose to as low as reasonably achievable. COMPARISON: 06/01/2024 and 05/14/2024. CLINICAL HISTORY: Mental status change, unknown cause. FINDINGS: BRAIN AND VENTRICLES: There is a focus of subdural hemorrhage along the left aspect of the anterior falx which measures up to 5 mm in thickness best appreciated on series 5 image 53. Remote infarcts in the bilateral cerebellum. Nonspecific hypoattenuation in the periventricular and subcortical white matter, most likely representing chronic microvascular ischemic changes. Generalized parenchymal volume loss with associated prominence of the ventricles. Remote lacunar infarcts in the bilateral thalami. Atherosclerosis of the skull base  vessels. No evidence of acute infarct. No hydrocephalus. No mass effect or midline shift. ORBITS: Right lens replacement. Left phthisis bulbi. SINUSES: No acute abnormality. SOFT TISSUES AND SKULL: Prominent atherosclerosis of multiple vessels in the scalp. Mild generalized edema of the subcutaneous tissues in the scalp. Small midline frontal scalp hematoma. No skull fracture. IMPRESSION: 1. Focal acute subdural hemorrhage along the left aspect of the anterior falx measuring up to 5 mm in thickness. 2. Small midline frontal scalp hematoma, decreased since 06/01/24. 3. Remote infarcts in the bilateral cerebellum and bilateral thalami. 4. Moderate chronic microvascular ischemic change and generalized parenchymal volume loss. Electronically signed by: Donnice Mania MD 06/04/2024 02:29 PM EST RP Workstation: [id number]   DG Chest Port 1 View Result Date: 06/04/2024 CLINICAL DATA:  Sepsis EXAM: PORTABLE CHEST 1 VIEW COMPARISON:  Chest radiograph dated 05/17/2024 FINDINGS: Lines/tubes: Right chest wall pacemaker leads project over the right atrium and ventricle. Lungs: Well inflated lungs. Diffuse interstitial opacities. Dense  left retrocardiac opacity. Pleura: Small to moderate left pleural effusion.  No pneumothorax. Heart/mediastinum: The heart size and mediastinal contours are within normal limits. Bones: No acute osseous abnormality. IMPRESSION: 1. Small to moderate left pleural effusion with dense left retrocardiac opacity, which may represent atelectasis, aspiration, or pneumonia. 2. Diffuse interstitial opacities, which may represent pulmonary edema. Electronically Signed   By: Limin  Xu M.D.   On: 06/04/2024 14:22     .Critical Care  Performed by: Charlyn Sora, MD Authorized by: Charlyn Sora, MD   Critical care provider statement:    Critical care time (minutes):  48   Critical care time was exclusive of:  Separately billable procedures and treating other patients   Critical care was necessary to treat or prevent imminent or life-threatening deterioration of the following conditions:  CNS failure or compromise and renal failure   Critical care was time spent personally by me on the following activities:  Development of treatment plan with patient or surrogate, discussions with consultants, evaluation of patient's response to treatment, examination of patient, ordering and review of laboratory studies, ordering and review of radiographic studies, ordering and performing treatments and interventions, pulse oximetry, re-evaluation of patient's condition, review of old charts and obtaining history from patient or surrogate    Medications Ordered in the ED - No data to display                                  Medical Decision Making Amount and/or Complexity of Data Reviewed Labs: ordered. Radiology: ordered.  Risk Decision regarding hospitalization.   73 year old patient with pertinent past medical history of dementia, ESRD on hemodialysis, sick sinus syndrome status post pacemaker placement, recent fall for which she was taken to outside hospital comes in with chief complaint of acute altered mental  status change.  Collateral history provided by patient's spouse, was at the bedside.  I have reviewed patient's previous records including the medications.  Differential diagnosis considered for this patient includes: ICH / Stroke, subdural hematoma, acute coronary syndrome, Infection - UTI/Pneumonia/soft tissue infection leading to encephalopathy, encephalopathy due to electrolyte abnormality or drug interactions or toxins or metabolic conditions like adrenal insufficiency, hyperglycemia, paraneoplastic process  Based on initial assessment, higher suspicion for TBI/concussion and possibly uremia and significant electrolyte abnormality.  We will get repeat CT head.  Plan is to get basic labs and repeat CT scan of the brain.  Reassessment: I have independently reviewed the  following labs: BUN is 44.  Patient does not have hyperkalemia.  Hemoglobin is slightly lower than baseline at 9.5 and platelet is over 100 which appears to be patient's normal.  I have independently reviewed the following imaging: CT scan of the brain, I did not see any large bleed or any mass effect, but radiologist does confirm a 5 mm subdural hematoma.  Patient was also reassessed at 3 PM.  Results of the ED workup discussed with the patient and family.  I consulted neurosurgery, Dr. Gillie has cleared the patient from neurosurgery perspective.  I consulted nephrology, dr. Geralynn, patient will be on dialysis list.    Final diagnoses:  Subdural hematoma (HCC)  Traumatic brain injury, with unknown loss of consciousness status, initial encounter (HCC)  ESRD (end stage renal disease) on dialysis [facility])    ED Discharge Orders     None          Charlyn Sora, MD 06/04/24 1543

## 2024-06-04 NOTE — Progress Notes (Signed)
 Hypoglycemic Event  CBG: 55  Treatment: D50 25 mL (12.5 gm)  Symptoms: None  Follow-up CBG: Time:2118 CBG Result:126  Possible Reasons for Event: Inadequate meal intake  Comments/MD notified: Jolaine, MD    Gregory Hood

## 2024-06-04 NOTE — H&P (Cosign Needed Addendum)
 Date: 06/04/2024               Patient Name:  Gregory Hood MRN: [id number]  DOB: [birth year] Age / Sex: 73 y.o., male   PCP: Valma Carwin, MD         Medical Service: Internal Medicine Teaching Service         Attending Physician: Dr. Mliss Pouch      First Contact: Armando Rossetti, MD    Second Contact: Dr. Missy Sandhoff, MD         Pager Information: First Contact Pager: [phone]   Second Contact Pager: [phone]   SUBJECTIVE   Chief Complaint: AMS  History of Present Illness  Gregory Hood is a 73 y.o. 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 The patient was resting during interview, most of HPI obtained from spouse at bedside. After the patient was discharged from IMTS on 05/20/2024, the patient was feeling a bit better and closer to his baseline.  He was eating with assistance but did have trouble maintaining good adequate fluid intake due to his vascular dementia.  [name] (patient's wife) states that his SNF was no longer checking blood sugars consistently because they were told that the patient no longer has diabetes.  Early Friday morning, the patient was awakened early in the morning for dialysis by his nursing home.  The patient was sitting in a wheelchair, fell forward while he was at the nurses station, and struck his head.  The wound on his head did not stop bleeding so he was sent to [facility] for further evaluation in the ED.  He did receive a CT scan of his head and this was reportedly normal so the patient was discharged back to his SNF.  After his fall, the patient has been sleepier than normal.  He did miss dialysis on Friday but family notes that this is usually not a problem for him.  Family at bedside report that the patient has been complaining of pain where he hit his head but nowhere else. Only other symptom is cough that started today.  Per ED Triage note, the patient  was BIBEMS from [facility] with CBG of 57->24 which improved to 120 after 2mg  total of glucagon and two bags of d10.   Meds - no recent med changes since last discharge Acetaminophen  500 mg daily Amiodarone  200 mg daily Aspirin  81 mg at bedtime Atropine  1% ophthalmic solution 1 drop in both eyes Bisacodyl  10 mg as needed Calcium  acetate 667 mg 3 times daily Cefpodoxime  200 mg twice daily for 2 days Clopidogrel  75 mg daily Divalproex  125 mg daily at 12 noon Finasteride  5 mg daily Glucagon Emergency Injection 1 mg as needed Glucose 77.4% Gel as needed Losartan  100 mg at bedtime Melatonin 3 mg at bedtime Memantine  5 mg twice daily Spironolactone  50 mg daily   Allergies  Allergies as of 06/04/2024 - Review Complete 06/04/2024  Allergen Reaction Noted   Bee venom Swelling 03/22/2018   Ace inhibitors Cough 02/17/2014   Lexiscan  [regadenoson ] Other (See Comments) 06/25/2014   Statins Other (See Comments) 01/30/2018    Past Medical History ESRD on hemodialysis (MWF) Multiple prior CVA Vascular dementia HFpEF (EF 45-55%), LVH, grade I-II diastolic dysfunction Atrial fibrillation not on AC COPD Type II diabetes mellitus c/b ESRD Orthostatic hypotension OSA HLD BPH Obesity  Sick Sinus Syndrome s/p dual chamber pacemaker  Past Surgical  History Past Surgical History:  Procedure Laterality Date   BASCILIC VEIN TRANSPOSITION Left 03/06/2015   Procedure: LET BASCILIC VEIN TRANSPOSITION;  Surgeon: Lonni GORMAN Blade, MD;  Location: [facility] OR;  Service: Vascular;  Laterality: Left;   CIRCUMCISION     EYE SURGERY Bilateral    had surgery several different times.   HAMMER TOE SURGERY Left 30 years ago   small toe left toe   HERNIA REPAIR     IR AV DIALY SHUNT INTRO NEEDLE/INTRACATH INITIAL W/PTA/IMG LEFT  07/20/2018   SEPTOPLASTY  20 years ago   Social History  Lives With: SNF, [facility] Occupation: Retired education officer, environmental Support: Excellent  support from wife and family Level of Function: Wheelchair-bound, dependent in most ADLs/IADLs PCP: Valma Carwin, MD Substances: Tobacco: Quit 50 years ago Alcohol: None Recreational drugs: None   Family History  Family History  Problem Relation Age of Onset   Hypertension Mother    Diabetes type II Mother    Transient ischemic attack Mother    Kidney disease Mother    Diabetes Mother    Varicose Veins Mother    Hypertension Father    Diabetes type II Father    Diabetes Father    Diabetes type II Sister    Hypertension Sister    Diabetes Sister    Learning disabilities Maternal Uncle      Review of Systems  A complete ROS was negative except as per HPI.   OBJECTIVE:   Physical Exam: Blood pressure 104/74, pulse 70, temperature 98.9 F (37.2 C), temperature source Temporal, resp. rate 13, SpO2 95%.  Constitutional: Chronically ill-appearing elderly man in no acute distress; resting in bed HENT: normocephalic atraumatic, mucous membranes moist Eyes: conjunctiva non-erythematous, PERRL, no scleral icterus Cardiovascular: regular rate and rhythm, no m/r/g Pulmonary/Chest: normal work of breathing on room air; anterior lung fields CTAB - exam limited due to patient not inspiring deeply Abdominal: soft, non-tender, non-distended, bowel sounds normal Neurological: alert & oriented to name only; responds to name but does not respond to commands during exam, moving all extremities equally Skin: extremities cool to the touch with palpable pulses Extremities: no edema or cyanosis; peripheral pulses intact Psych: normal mood and affect, thought content normal  Labs: CBC    Component Value Date/Time   WBC 6.8 06/04/2024 1255   RBC 3.05 (L) 06/04/2024 1255   HGB 9.5 (L) 06/04/2024 1255   HCT 29.3 (L) 06/04/2024 1255   HCT 38.2 07/11/2018 2330   PLT 108 (L) 06/04/2024 1255   MCV 96.1 06/04/2024 1255   MCH 31.1 06/04/2024 1255   MCHC 32.4 06/04/2024 1255   RDW 14.6  06/04/2024 1255   LYMPHSABS 0.7 06/04/2024 1255   MONOABS 0.5 06/04/2024 1255   EOSABS 0.1 06/04/2024 1255   BASOSABS 0.1 06/04/2024 1255     CMP     Component Value Date/Time   NA 137 06/04/2024 1255   K 4.5 06/04/2024 1255   CL 94 (L) 06/04/2024 1255   CO2 28 06/04/2024 1255   GLUCOSE 190 (H) 06/04/2024 1255   BUN 44 (H) 06/04/2024 1255   CREATININE 9.75 (H) 06/04/2024 1255   CALCIUM  8.9 06/04/2024 1255   PROT 6.4 (L) 06/04/2024 1255   ALBUMIN 3.1 (L) 06/04/2024 1255   AST 25 06/04/2024 1255   ALT 16 06/04/2024 1255   ALKPHOS 66 06/04/2024 1255   BILITOT 1.2 06/04/2024 1255   GFRNONAA 5 (L) 06/04/2024 1255   GFRAA 19 (L) 01/19/2019 2111  Imaging: CT Head Wo Contrast Result Date: 06/04/2024 EXAM: CT HEAD WITHOUT CONTRAST 06/04/2024 01:51:54 PM TECHNIQUE: CT of the head was performed without the administration of intravenous contrast. Automated exposure control, iterative reconstruction, and/or weight based adjustment of the mA/kV was utilized to reduce the radiation dose to as low as reasonably achievable. COMPARISON: 06/01/2024 and 05/14/2024. CLINICAL HISTORY: Mental status change, unknown cause. FINDINGS: BRAIN AND VENTRICLES: There is a focus of subdural hemorrhage along the left aspect of the anterior falx which measures up to 5 mm in thickness best appreciated on series 5 image 53. Remote infarcts in the bilateral cerebellum. Nonspecific hypoattenuation in the periventricular and subcortical white matter, most likely representing chronic microvascular ischemic changes. Generalized parenchymal volume loss with associated prominence of the ventricles. Remote lacunar infarcts in the bilateral thalami. Atherosclerosis of the skull base vessels. No evidence of acute infarct. No hydrocephalus. No mass effect or midline shift. ORBITS: Right lens replacement. Left phthisis bulbi. SINUSES: No acute abnormality. SOFT TISSUES AND SKULL: Prominent atherosclerosis of multiple vessels in the  scalp. Mild generalized edema of the subcutaneous tissues in the scalp. Small midline frontal scalp hematoma. No skull fracture. IMPRESSION: 1. Focal acute subdural hemorrhage along the left aspect of the anterior falx measuring up to 5 mm in thickness. 2. Small midline frontal scalp hematoma, decreased since 06/01/24. 3. Remote infarcts in the bilateral cerebellum and bilateral thalami. 4. Moderate chronic microvascular ischemic change and generalized parenchymal volume loss. Electronically signed by: Donnice Mania MD 06/04/2024 02:29 PM EST RP Workstation: [id number]   DG Chest Port 1 View Result Date: 06/04/2024 CLINICAL DATA:  Sepsis EXAM: PORTABLE CHEST 1 VIEW COMPARISON:  Chest radiograph dated 05/17/2024 FINDINGS: Lines/tubes: Right chest wall pacemaker leads project over the right atrium and ventricle. Lungs: Well inflated lungs. Diffuse interstitial opacities. Dense left retrocardiac opacity. Pleura: Small to moderate left pleural effusion.  No pneumothorax. Heart/mediastinum: The heart size and mediastinal contours are within normal limits. Bones: No acute osseous abnormality. IMPRESSION: 1. Small to moderate left pleural effusion with dense left retrocardiac opacity, which may represent atelectasis, aspiration, or pneumonia. 2. Diffuse interstitial opacities, which may represent pulmonary edema. Electronically Signed   By: Limin  Xu M.D.   On: 06/04/2024 14:22    EKG: personally reviewed my interpretation is ventricular-paced rhythm.  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  #Encephalopathy #Subdural Hemorrhage #Hypoglycemia #Vascular Dementia Patient  appears somnolent on exam but does awaken when calling his name. Only oriented to name. No infectious signs. Encephalopathy likely multifactorial given patient's recent fall now with subdural hemorrhage, hypoglycemia, and missed HD sessions in the context of baseline vascular dementia. Patient's fall sounds to be in the setting of hypoglycemia which did improve after glucagon and D10 infusions. However, fall was unwitnessed per family.  Hypoglycemia likely in the setting of poor PO intake. Appears stable at this time with glucose up to 148, will continue to monitor blood sugars. Consider further D10 if glucose starts to drop. Neurosurgery consulted who deemed SDH too small for operative intervention. High risk of delirium, will place on precautions during hospitalization.  - q4h CBG - Delirium precautions -  Neuro checks - Continue melatonin 3 mg at bedtime   #ESRD on HD (MWF) Appears euvolemic. Does not make urine. Last HD session was on Wednesday as patient missed sessions on Friday and today.  Electrolytes stable.  Nephrology consulted by EDP and following patient, appreciate recommendations. - HD tomorrow AM per Nephro - Hold home BP meds   #Multiple prior CVA CT Head without evidence of acute infarct but does show small SDH. Will hold home medications due to concern for acute bleed. - Hold home ASA + Plavix   #PAF #Sick Sinus Syndrome s/p dual-chamber pacemaker CHA2DS2-VASc of 6.  Not currently on anticoagulation.  Given concern for acute SDH, would hold anyway. Takes amiodarone  at home. EKG showing ventricular-paced rhythm. No acute concerns at this time. - Continue home amiodarone    #Cough Acute cough onset today, lung exam difficult due to patient not following commands. No leukocytosis or other signs of infection. CXR showing left pleural effusion with retrocardiac opacity representing atelectasis vs. Aspiration vs. PNA.  Prior admission showing similar CXR findings.  Doubt infectious  etiology at this time, atelectasis more likely given clinical picture. - Incentive spirometry - Continue to monitor  #Chronic HFpEF Appears euvolemic. Last Echo on 06/02/23 showing LVEF of 50-55% with mild regional wall motion abnormality. No acute concerns at this time. - Holding home losartan   #Hypertension BP wnl upon presentation to ED. Will hold meds and continue to monitor. - Hold home antihypertensives per nephrology  #Anemia of ESRD Hgb with drop to 9.5 from 11.2 last week. Known small SDH in the context of chronic ESRD. No other reported bleeding symptoms.  Not actively bleeding on exam.  Will continue to follow. - Trend CBCs  #T2DM Last A1c 3 weeks ago within normal range. Not on any antihyperglycemics. Struggling with recent hypoglycemia as described above. Will not place on SSI given hypoglycemia.  - continue q4h CBG checks  Chronic Stable Medical Problems  #Chronic pain - Acetaminophen  500 mg daily - Consider topicals prn  #Constipation - continue home bowel regimen  #Chronic thrombocytopenia - continue to monitor  #BPH - continue home finasteride  5 mg at bedtime    Best practice: Diet: NPO until bedside swallow VTE: SCDs IVF: None,None Code: Full  Disposition planning: Prior to Admission Living Arrangement: SNF Anticipated Discharge Location: SNF  Dispo: Admit patient to Observation with expected length of stay less than 2 midnights.  Signed: Tavarius Grewe, MD [facility]  PGY-1 06/04/2024, 5:45 PM  Please contact IM Residency On-Call Pager at: [phone] or [phone].

## 2024-06-04 NOTE — ED Notes (Signed)
 1st BC collected from IV after multiple attempts at venipuncture by myself and phlebotomy.  Unable to collect 2nd set of cultures.

## 2024-06-04 NOTE — ED Triage Notes (Signed)
 Pt BIB [facility] from dialysis center, [facility]. Upon EMS arrival pt CBG was 57,  received 1 mg glucagon, dropped again to 68, started on d10 insfusion and dropped again to 24, given 1 mg of glucagon and another bag of d10. CBG currently 120 upon arrival.   117/75 HR 105 100% room air

## 2024-06-04 NOTE — Consult Note (Signed)
 Renal Service Consult Note [facility] [name], MD  Patient: Gregory Hood Date: 06/04/2024 Requesting Physician: Dr. Charlyn  Reason for Consult: ESRD pt with altered mental status and small SDH HPI: The patient is a 73 y.o. year-old w/ PMH as below who presented to ED today sent from HD center (did not do HD due to altered MS) for AMS and hypoglycemia. He was rx'd w/ glucagon and d10 IVF's for 1-2 rounds then BS came up to 120. In ED BP 117/75, HR 105, 100% on RA. CXR -> CM w/ no infiltrates, K+ 4.5, bun 44, creat 9.75  alb 3.1 , wBC 6K, Hb 9.5. CT head showed small SDH 5 mm at the L anterior falx, old infarcts bilat carebellum, remote lacunar infarcts bilat thalami and no acute CVA. Neurosurg were called and they said there was no indication for surgery. Pt admitted to medical service. We are asked to see for esrd.    Pt seen in ED. Pt is very drowsy and is snoring and not following commands. No hx obtained. The wife gave history. Had a fall last Friday. Went to ED in [place] and they did tests and sent him back to SNF. Then pt got worse from MS standpoint over the weekend and they came to ED at [facility].    ROS - n/a   Past Medical History  Past Medical History:  Diagnosis Date   COPD (chronic obstructive pulmonary disease) (HCC)    Diabetes mellitus without complication (HCC)    Diabetic retinopathy (HCC)    ESRD on hemodialysis (HCC)    Hyperlipidemia    Hypertension    Obesity    Orthostatic hypotension    OSA on CPAP    Retinal detachment    Seizures (HCC)    Stroke (HCC)    TIA (transient ischemic attack)    Umbilical hernia    Past Surgical History  Past Surgical History:  Procedure Laterality Date   BASCILIC VEIN TRANSPOSITION Left 03/06/2015   Procedure: LET BASCILIC VEIN TRANSPOSITION;  Surgeon: Lonni GORMAN Blade, MD;  Location: [facility] OR;  Service: Vascular;  Laterality: Left;   CIRCUMCISION     EYE SURGERY Bilateral    had surgery several  different times.   HAMMER TOE SURGERY Left 30 years ago   small toe left toe   HERNIA REPAIR     IR AV DIALY SHUNT INTRO NEEDLE/INTRACATH INITIAL W/PTA/IMG LEFT  07/20/2018   SEPTOPLASTY  20 years ago   Family History  Family History  Problem Relation Age of Onset   Hypertension Mother    Diabetes type II Mother    Transient ischemic attack Mother    Kidney disease Mother    Diabetes Mother    Varicose Veins Mother    Hypertension Father    Diabetes type II Father    Diabetes Father    Diabetes type II Sister    Hypertension Sister    Diabetes Sister    Learning disabilities Maternal Uncle    Social History  reports that he quit smoking about 50 years ago. His smoking use included cigarettes. He has never used smokeless tobacco. He reports that he does not drink alcohol and does not use drugs. Allergies  Allergies  Allergen Reactions   Bee Venom Swelling   Ace Inhibitors Cough   Lexiscan  [Regadenoson ] Other (See Comments)    Seizure like-activity after lexiscan  given.   Statins Other (See Comments)    Other reaction(s): Other (See Comments) Myalgias  and CK increase.  Tried both atorvastatin  and rosuvastatin    Home medications Prior to Admission medications   Medication Sig Start Date End Date Taking? Authorizing Provider  acetaminophen  (TYLENOL ) 500 MG tablet Take 500 mg by mouth daily.    [provider]  amiodarone  (PACERONE ) 200 MG tablet Take 200 mg by mouth daily. 04/19/24   [provider]  aspirin  81 MG tablet Take 1 tablet (81 mg total) by mouth at bedtime. 12/24/20   Hongalgi, Anand D, MD  atropine  1 % ophthalmic solution Place 1 drop into both eyes. 10/25/23   [provider]  bisacodyl  (DULCOLAX) 10 MG suppository Place 10 mg rectally as needed for moderate constipation.    [provider]  calcium  acetate (PHOSLO ) 667 MG capsule Take 667 mg by mouth 3 (three) times daily. 08/29/20   [provider]  clopidogrel  (PLAVIX )  75 MG tablet Take 75 mg by mouth daily. 03/10/18   [provider]  Dextrose , Diabetic Use, (GLUCOSE) 77.4 % GEL Take 1 Dose by mouth as needed (blood glucose less than 70).    [provider]  divalproex  (DEPAKOTE ) 125 MG DR tablet Take 125 mg by mouth daily at 12 noon.    [provider]  finasteride  (PROSCAR ) 5 MG tablet Take 5 mg by mouth daily. 03/26/19   [provider]  Glucagon, rDNA, (GLUCAGON EMERGENCY IJ) Inject 1 mg into the muscle as needed (blood sugar less than 70).    [provider]  losartan  (COZAAR ) 100 MG tablet Take 1 tablet (100 mg total) by mouth at bedtime. 05/20/24   Koomson, Julius, MD  melatonin 3 MG TABS tablet Take 3 mg by mouth at bedtime. 05/04/23   [provider]  memantine  (NAMENDA ) 5 MG tablet Take 1 tablet (5 mg total) by mouth 2 (two) times daily. 05/20/24   Celestina Czar, MD  spironolactone  (ALDACTONE ) 50 MG tablet Take 1 tablet by mouth daily. 12/16/23   [provider]     Vitals:   06/04/24 1150 06/04/24 1155 06/04/24 1503 06/04/24 1542  BP:   104/74   Pulse:   70   Resp: 17 16 13    Temp:    98.9 F (37.2 C)  TempSrc:    Temporal  SpO2:   95%    Exam Gen somnolent, not following commands Sclera anicteric, throat clear  No jvd or bruits Chest clear bilat to bases RRR no MRG Abd soft ntnd no mass or ascites +bs Ext no LE or UE edema, no other edema Neuro is alert, Ox 3 , nf    LUA AVF+bruit   Home bp meds: Losartan  Aldactone     OP HD: MWF [place] May 19, 2024 -> 3h B450 79.8kg AVG Heparin  none   CXR - CM, infiltrates K+ 4.5, bun 44, creat 9.75  alb 3.1 , wBC 6K, Hb 9.5   CT head -> small SDH at 5 mm, L anterior falx                    - old infarcts bilat carebellum        - remote lacunar infarcts bilat thalami         - no acute CVA   Assessment/ Plan: Fall / altered mental status: small 5 mm SDH by CT head. Prior strokes showed up on CT but no acute CVA. Was  here recently for AMS. H/o dementia.  ESRD: on HD MWF. Missed last Friday HD and  today. Volume/ labs are unremarkable.  Plan is for HD in am 1st shift. Given his AMS would rather not do HD at night w/ minimal staff. Have d/w family.  HTN: bp's soft 100-110 range this afternoon. Min UF w/ next HD. Hold home/ SNF bp lowering meds.  Volume: no vol excess on exam. Follow.  Anemia of esrd: Hb 9-10 here, follow.        Myer Fret  MD [facility] 06/04/2024, 3:45 PM  Recent Labs  Lab 06/04/24 1255  HGB 9.5*  ALBUMIN 3.1*  CALCIUM  8.9  CREATININE 9.75*  K 4.5   Inpatient medications:

## 2024-06-05 DIAGNOSIS — F0150 Vascular dementia without behavioral disturbance: Secondary | ICD-10-CM | POA: Diagnosis not present

## 2024-06-05 DIAGNOSIS — N186 End stage renal disease: Secondary | ICD-10-CM | POA: Diagnosis not present

## 2024-06-05 DIAGNOSIS — L89621 Pressure ulcer of left heel, stage 1: Secondary | ICD-10-CM | POA: Diagnosis not present

## 2024-06-05 LAB — GLUCOSE, CAPILLARY
Glucose-Capillary: 126 mg/dL — ABNORMAL HIGH (ref 70–99)
Glucose-Capillary: 73 mg/dL (ref 70–99)
Glucose-Capillary: 75 mg/dL (ref 70–99)
Glucose-Capillary: 78 mg/dL (ref 70–99)
Glucose-Capillary: 83 mg/dL (ref 70–99)
Glucose-Capillary: 84 mg/dL (ref 70–99)
Glucose-Capillary: 97 mg/dL (ref 70–99)

## 2024-06-05 LAB — CBC
HCT: 30 % — ABNORMAL LOW (ref 39.0–52.0)
Hemoglobin: 9.8 g/dL — ABNORMAL LOW (ref 13.0–17.0)
MCH: 30.9 pg (ref 26.0–34.0)
MCHC: 32.7 g/dL (ref 30.0–36.0)
MCV: 94.6 fL (ref 80.0–100.0)
Platelets: 114 K/uL — ABNORMAL LOW (ref 150–400)
RBC: 3.17 MIL/uL — ABNORMAL LOW (ref 4.22–5.81)
RDW: 14.5 % (ref 11.5–15.5)
WBC: 4.6 K/uL (ref 4.0–10.5)
nRBC: 0 % (ref 0.0–0.2)

## 2024-06-05 LAB — BASIC METABOLIC PANEL WITH GFR
Anion gap: 13 (ref 5–15)
BUN: 48 mg/dL — ABNORMAL HIGH (ref 8–23)
CO2: 27 mmol/L (ref 22–32)
Calcium: 8.7 mg/dL — ABNORMAL LOW (ref 8.9–10.3)
Chloride: 97 mmol/L — ABNORMAL LOW (ref 98–111)
Creatinine, Ser: 10.44 mg/dL — ABNORMAL HIGH (ref 0.61–1.24)
GFR, Estimated: 5 mL/min — ABNORMAL LOW (ref >60)
Glucose, Bld: 78 mg/dL (ref 70–99)
Potassium: 4.7 mmol/L (ref 3.5–5.1)
Sodium: 137 mmol/L (ref 135–145)

## 2024-06-05 LAB — MRSA NEXT GEN BY PCR, NASAL: MRSA by PCR Next Gen: DETECTED — AB

## 2024-06-05 MED ORDER — LIDOCAINE-PRILOCAINE 2.5-2.5 % EX CREA: 1.0000 | TOPICAL_CREAM | CUTANEOUS | Status: DC | PRN

## 2024-06-05 MED ORDER — ANTICOAGULANT SODIUM CITRATE 4% (200MG/5ML) IV SOLN: 5.0000 mL | Status: DC | PRN

## 2024-06-05 MED ORDER — LIDOCAINE HCL (PF) 1 % IJ SOLN: 5.0000 mL | INTRAMUSCULAR | Status: DC | PRN

## 2024-06-05 MED ORDER — HEPARIN SODIUM (PORCINE) 5000 UNIT/ML IJ SOLN
5000.0000 [IU] | Freq: Three times a day (TID) | INTRAMUSCULAR | Status: DC
  Administered 2024-06-05 – 2024-06-06 (×3): 5000 [IU] via SUBCUTANEOUS
  Filled 2024-06-05 (×5): qty 1

## 2024-06-05 MED ORDER — NEPRO/CARBSTEADY PO LIQD: 237.0000 mL | ORAL | Status: DC | PRN

## 2024-06-05 MED ORDER — DEXTROSE 5 % IV SOLN: INTRAVENOUS | Status: DC

## 2024-06-05 MED ORDER — ALTEPLASE 2 MG IJ SOLR: 2.0000 mg | Freq: Once | INTRAMUSCULAR | Status: DC | PRN

## 2024-06-05 MED ORDER — HEPARIN SODIUM (PORCINE) 1000 UNIT/ML DIALYSIS: 1000.0000 [IU] | INTRAMUSCULAR | Status: DC | PRN

## 2024-06-05 MED ORDER — PENTAFLUOROPROP-TETRAFLUOROETH EX AERO: 1.0000 | INHALATION_SPRAY | CUTANEOUS | Status: DC | PRN

## 2024-06-05 MED ORDER — RENA-VITE PO TABS
1.0000 | ORAL_TABLET | Freq: Every day | ORAL | Status: DC
  Administered 2024-06-05 – 2024-06-07 (×3): 1 via ORAL
  Filled 2024-06-05 (×4): qty 1

## 2024-06-05 NOTE — Evaluation (Signed)
 Clinical/Bedside Swallow Evaluation Patient Details  Name: Gregory Hood MRN: [id number] Date of Birth: [birth year]  Today's Date: 06/05/2024 Time: SLP Start Time (ACUTE ONLY): 1027 SLP Stop Time (ACUTE ONLY): 1052 SLP Time Calculation (min) (ACUTE ONLY): 25 min  Past Medical History:  Past Medical History:  Diagnosis Date   COPD (chronic obstructive pulmonary disease) (HCC)    Diabetes mellitus without complication (HCC)    Diabetic retinopathy (HCC)    ESRD on hemodialysis (HCC)    Hyperlipidemia    Hypertension    Obesity    Orthostatic hypotension    OSA on CPAP    Retinal detachment    Seizures (HCC)    Stroke (HCC)    TIA (transient ischemic attack)    Umbilical hernia    Past Surgical History:  Past Surgical History:  Procedure Laterality Date   BASCILIC VEIN TRANSPOSITION Left 03/06/2015   Procedure: LET BASCILIC VEIN TRANSPOSITION;  Surgeon: Lonni GORMAN Blade, MD;  Location: [facility] OR;  Service: Vascular;  Laterality: Left;   CIRCUMCISION     EYE SURGERY Bilateral    had surgery several different times.   HAMMER TOE SURGERY Left 30 years ago   small toe left toe   HERNIA REPAIR     IR AV DIALY SHUNT INTRO NEEDLE/INTRACATH INITIAL W/PTA/IMG LEFT  07/20/2018   SEPTOPLASTY  20 years ago   HPI:  Gregory Hood is a 73 y.o. male who presented 12/8 from SNF for evaluation of AMS. Pt had had fall at [facility] 12/5 in which he struck his head.  Head CT 12/8: Focal acute subdural hemorrhage along the left aspect of the anterior falx  measuring up to 5 mm in thickness.  CXR 12/8: Small to moderate left pleural effusion with dense left retrocardiac opacity, which may represent atelectasis, aspiration, or pneumonia.  Pt is known to this service from prior admission at end of November with pna noted at that time, with recommendations to continue dysphagia 2 diet with thin liquids, which is his baseline diet at his facility.  Per chart review pt has had decreased PO intake, leading to  hypoglycemic events. 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  Pt known to this service from prior admission and hsa known hx oral dysphagia.  He consumes minced & moist/D2 diet at baseline at his facility.  Today pt tolerated all consistencies trialed, including serial straw sips of thin liquids, with no clinical s/s of aspiration.  Pt exhibited adequate oral clearance of simulated ground texture.  There was mild diffuse oral residue which improved with liquid wash.  Discussed swallow function with wife.  Decreased PO intake and recent pna could possibly, but not necessarily, be related to changes in swallow function.  Pt has not had recurrent pna this year.  Offered MBSS to assess swallow function if desired. Today's presentation seems consistent with his baseline level of function and with evaulation during admission last month.  Wife prefers to initiate PO diet and monitor clinically to determine if there is anyneed for further swallow evaluation.    Recommend dysphagia 2 (chopped/ground)/minced and moist diet texture with thin liquids.   SLP Visit Diagnosis: Dysphagia, oral phase (R13.11)    Aspiration Risk  Mild aspiration risk    Diet Recommendation Dysphagia 2 (Fine chop);Thin liquid    Liquid Administration via: Cup;Straw Medication  Administration: Crushed with puree Supervision: Staff to assist with self feeding Compensations: Slow rate;Small sips/bites Postural Changes: Seated upright at 90 degrees    Other Recommendations Oral Care Recommendations: Oral care BID     Swallow Evaluation Recommendations  See above    Assistance Recommended at Discharge  N/A  Functional Status Assessment Patient has had a recent decline in their functional status and demonstrates the ability to make significant improvements in function in a reasonable  and predictable amount of time.  Frequency and Duration min 2x/week  2 weeks       Prognosis Prognosis for improved oropharyngeal function: Fair      Swallow Study   General Date of Onset: 06/04/24 HPI: Gregory Hood is a 73 y.o. male who presented 12/8 from SNF for evaluation of AMS. Pt had had fall at [facility] 12/5 in which he struck his head.  Head CT 12/8: Focal acute subdural hemorrhage along the left aspect of the anterior falx  measuring up to 5 mm in thickness.  CXR 12/8: Small to moderate left pleural effusion with dense left retrocardiac opacity, which may represent atelectasis, aspiration, or pneumonia.  Pt is known to this service from prior admission at end of November with pna noted at that time, with recommendations to continue dysphagia 2 diet with thin liquids, which is his baseline diet at his facility.  Per chart review pt has had decreased PO intake, leading to hypoglycemic events. 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  No prior instrumental Diet Prior to this Study: NPO Temperature Spikes Noted: No Respiratory Status: Room air History of Recent Intubation: No Behavior/Cognition: Alert;Cooperative;Confused Oral Cavity Assessment: Within Functional Limits Oral Care Completed by SLP: No Oral Cavity - Dentition: Adequate natural dentition;Missing dentition Self-Feeding Abilities: Needs assist Patient Positioning: Upright in bed Baseline Vocal Quality: Normal Volitional Cough: Cognitively unable to elicit Volitional Swallow: Unable to elicit    Oral/Motor/Sensory Function Overall Oral Motor/Sensory Function: Within functional limits Facial ROM: Within Functional Limits Facial Symmetry: Within Functional Limits Lingual ROM: Within Functional Limits Lingual Symmetry: Within Functional  Limits Lingual Strength:  (could not test) Lingual Sensation: Within Functional Limits Velum: Within Functional Limits   Ice Chips Ice chips: Not tested   Thin Liquid Thin Liquid: Within functional limits Presentation: Straw    Nectar Thick Nectar Thick Liquid: Not tested   Honey Thick Honey Thick Liquid: Not tested   Puree Puree: Within functional limits   Solid     Solid: Within functional limits (for simulated ground consistency, which is his baseline diet texture) Presentation: Carollynn Anette FORBES Delana, MA, CCC-SLP Acute Rehabilitation Services Office: [phone] 06/05/2024,11:09 AM

## 2024-06-05 NOTE — Plan of Care (Signed)
   Problem: Activity: Goal: Risk for activity intolerance will decrease Outcome: Progressing   Problem: Nutrition: Goal: Adequate nutrition will be maintained Outcome: Progressing   Problem: Elimination: Goal: Will not experience complications related to bowel motility Outcome: Progressing   Problem: Safety: Goal: Ability to remain free from injury will improve Outcome: Progressing

## 2024-06-05 NOTE — TOC CM/SW Note (Signed)
 Transition of Care [facility]) - Inpatient Brief Assessment   Patient Details  Name: Gregory Hood MRN: [id number] Date of Birth: Nov 16, 1950  [facility] CM/SW Contact:    [name], LCSW Phone Number: 06/05/2024, 10:14 AM   Clinical Narrative:    Patient is a LTC resident at [facility], where he has resided since 2020. Per SNF liaison, they have issues with the patient's wife interfering with care. She has concerns about him being dehydrated, so she will request he be taken off HD early, and also has concerns about polypharmacy even though the SNF hasn't changed any of his medications. SNF has to send the patient with a sitter to HD for him to tolerate. [facility] is prepared to admit patient back when he is medically ready. CSW to follow.    Transition of Care Asessment: Insurance and Status: Insurance coverage has been reviewed Patient has primary care physician: Yes Home environment has been reviewed: [facility] SNF Prior level of function:: SNF resident Prior/Current Home Services: No current home services Social Drivers of Health Review: SDOH reviewed no interventions necessary Readmission risk has been reviewed: Yes Transition of care needs: transition of care needs identified, TOC will continue to follow

## 2024-06-05 NOTE — TOC CAGE-AID Note (Signed)
 Transition of Care [facility]) - CAGE-AID Screening   Patient Details  Name: Gregory Hood MRN: [id number] Date of Birth: Nov 27, 1950  [facility]) CM/SW Contact:    Tisheena Maguire E Navdeep Halt, LCSW Phone Number: 06/05/2024, 8:58 AM   Clinical Narrative: Disoriented x 2.   CAGE-AID Screening: Substance Abuse Screening unable to be completed due to: : Patient unable to participate

## 2024-06-05 NOTE — Progress Notes (Signed)
 2 liters ultrafiltration, post hemodialysis condition stable, and report was given to the primary RN , and without complication.

## 2024-06-05 NOTE — Progress Notes (Signed)
 Pt receives out-pt HD at [facility] on MWF 9:30 am chair time. Will assist as needed.   Randine Mungo Dialysis Navigator [phone]

## 2024-06-05 NOTE — Progress Notes (Signed)
  [facility] Progress Note  Subjective:  Seen in room Pt eyes closed but would respond to simple questions Wife at bedside  Presentation summary: 73 y.o. year-old w/ PMH as below who presented to ED today sent from HD center (did not do HD due to altered MS) for AMS and hypoglycemia. He was rx'd w/ glucagon and d10 IVF's for 1-2 rounds then BS came up to 120. In ED BP 117/75, HR 105, 100% on RA. CXR -> CM w/ no infiltrates, K+ 4.5, bun 44, creat 9.75  alb 3.1 , wBC 6K, Hb 9.5. CT head showed very small SDH (5 mm) at the L anterior falx, old infarcts bilat carebellum, remote lacunar infarcts bilat thalami and no acute CVA. Neurosurg were called and they said there was no indication for surgery. Pt admitted to medical service. We are asked to see for esrd.     Vitals:   06/05/24 1430 06/05/24 1500 06/05/24 1530 06/05/24 1602  BP: 122/70 136/68 115/85 115/67  Pulse: 72 75 70 70  Resp: 17  12 12   Temp:      TempSrc:      SpO2: 100% 100% 100% 100%  Weight:        Exam: Gen somnolent, follows simple commands Sclera anicteric, throat clear  No jvd or bruits Chest clear bilat to bases RRR no MRG Abd soft ntnd no mass or ascites +bs Ext no LE or UE edema, no other edema Neuro is as above    LUA AVF+bruit    Home bp meds: Losartan  Aldactone      OP HD: MWF [place] May 19, 2024 -> 3h B450 79.8kg AVG Heparin  none    CXR - CM, no infiltrates CT head -> small SDH at 5 mm, L anterior falx                    - old infarcts bilat carebellum        - remote lacunar infarcts bilat thalami                    - no acute CVA     Assessment/ Plan: Fall / altered mental status: small 5 mm SDH by CT head. Prior strokes showed up on CT but no acute CVA. Was here recently for AMS. H/o dementia.  ESRD: on HD MWF. Missed last 2 HD sessions. Plan is HD today off schedule. Next HD Thursday off schedule.  HTN: bp's soft 100-110 range this afternoon. Min UF w/ HD. Hold bp  lowering meds.  Volume: no vol excess on exam. At dry wt. Follow.  Anemia of esrd: Hb 9-10 here, follow.     Myer Fret MD  [facility] 06/05/2024, 4:27 PM  Recent Labs  Lab 06/04/24 1255 06/05/24 0227  HGB 9.5* 9.8*  ALBUMIN 3.1*  --   CALCIUM  8.9 8.7*  CREATININE 9.75* 10.44*  K 4.5 4.7   No results for input(s): IRON, TIBC, FERRITIN in the last 168 hours. Inpatient medications:  acetaminophen   500 mg Oral Daily   amiodarone   200 mg Oral Daily   Chlorhexidine  Gluconate Cloth  6 each Topical Q0600   divalproex   125 mg Oral Q1200   finasteride   5 mg Oral QHS   heparin  injection (subcutaneous)  5,000 Units Subcutaneous Q8H   melatonin  3 mg Oral QHS   multivitamin  1 tablet Oral QHS   senna  1 tablet Oral BID    anticoagulant sodium citrate   dextrose  50 mL/hr at 06/05/24 1212   alteplase , anticoagulant sodium citrate , bisacodyl , feeding supplement (NEPRO CARB STEADY), heparin , lidocaine  (PF), lidocaine -prilocaine , pentafluoroprop-tetrafluoroeth

## 2024-06-05 NOTE — Progress Notes (Cosign Needed Addendum)
 HD#0 SUBJECTIVE:  Patient Summary: Gregory Hood is a 73 y.o. 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  Overnight Events: No acute overnight events reported. Blood glucose remained stable on q4h checks. No seizures. No worsening headache reported by nursing staff.  Interim History: Patient was resting in bed, very sleepy, inattentive, arousable to voice, and oriented only to self. Minimal spontaneous speech. No acute distress. Completed hemodialysis today with 2 L UF; tolerated session without complication. No chest pain or dyspnea reported to staff. Family updated at bedside; goals of care discussion ongoing, awaiting final decision from wife and family.  OBJECTIVE:  Vital Signs: Vitals:   06/05/24 1700 06/05/24 1731 06/05/24 1747 06/05/24 1750  BP: 123/72 135/69 133/70 (!) 140/74  Pulse: 72 69 70 72  Resp: 12 12 12 12   Temp:    97.7 F (36.5 C)  TempSrc:      SpO2: 100% 100% 100% 100%  Weight:    76.9 kg   Supplemental O2: Room Air SpO2: 100 %  Filed Weights   06/05/24 1343 06/05/24 1750  Weight: 79.8 kg 76.9 kg     Intake/Output Summary (Last 24 hours) at 06/05/2024 1817 Last data filed at 06/05/2024 1750 Gross per 24 hour  Intake 218.94 ml  Output 2000 ml  Net -1781.06 ml   Net IO Since Admission: -1,781.06 mL [06/05/24 1817]  Physical Exam: Physical Exam General: Sleepy, chronically ill-appearing; no acute distress. Neuro: Arousable to voice, oriented to self only. Not following commands well. Moves all extremities. Cardiac: RRR, no murmurs. Pulm: Normal WOB on room air; anterior fields clear. Abd: Soft, NTND, +BS. Extremities: No edema; cool but well-perfused; pulses intact. Skin: No new bruising; scalp hematoma improving. Psych: Flat affect;  inattentive. Patient Lines/Drains/Airways Status     Active Line/Drains/Airways     Name Placement date Placement time Site Days   Peripheral IV 06/04/24 18 G Right Antecubital 06/04/24  1109  Antecubital  1   Fistula / Graft Left Upper arm Arteriovenous fistula 03/06/15  1150  Upper arm  3379   Fistula / Graft Left Upper arm 03/22/18  0627  Upper arm  2267            Pertinent labs and imaging:      Latest Ref Rng & Units 06/05/2024    2:27 AM 06/04/2024   12:55 PM 05/20/2024    5:00 AM  CBC  WBC 4.0 - 10.5 K/uL 4.6  6.8  5.3   Hemoglobin 13.0 - 17.0 g/dL 9.8  9.5  88.7   Hematocrit 39.0 - 52.0 % 30.0  29.3  33.3   Platelets 150 - 400 K/uL 114  108  112        Latest Ref Rng & Units 06/05/2024    2:27 AM 06/04/2024   12:55 PM 05/20/2024    6:47 AM  CMP  Glucose 70 - 99 mg/dL 78  809  85   BUN 8 - 23 mg/dL 48  44  31   Creatinine 0.61 - 1.24 mg/dL 89.55  0.24  2.31   Sodium 135 - 145 mmol/L 137  137  134   Potassium 3.5 - 5.1 mmol/L 4.7  4.5  4.3   Chloride 98 - 111 mmol/L 97  94  92   CO2 22 - 32 mmol/L  27  28  26    Calcium  8.9 - 10.3 mg/dL 8.7  8.9  9.5   Total Protein 6.5 - 8.1 g/dL  6.4    Total Bilirubin 0.0 - 1.2 mg/dL  1.2    Alkaline Phos 38 - 126 U/L  66    AST 15 - 41 U/L  25    ALT 0 - 44 U/L  16      No results found.  ASSESSMENT/PLAN:  Assessment: Principal Problem:   Altered mental state Active Problems:   Hypoglycemia   ESRD on dialysis (HCC)   AMS (altered mental status)   Subdural hemorrhage (HCC)   Plan: HARACE MCCLUNEY is a 73 y.o. 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  # Encephalopathy (multifactorial) Contributors: small SDH, recent hypoglycemia, missed HD, baseline vascular dementia. Today more somnolent but arousable; no  focal deficits. Neuro exam stable. - q4h CBG - Neuro checks - Delirium precautions -Continue melatonin 3 mg at bedtime - Continue to monitor mental status day-to-day  # Subdural Hemorrhage (5 mm falcine, stable) Neurosurgery evaluated; no surgical intervention indicated. Repeat CT only if change in exam. - Hold ASA and Plavix  - Monitor neuro status  # ESRD on HD (MWF) Missed Friday HD; completed HD today without issues. Appears euvolemic. Continue HD per Nephrology Hold BP meds per Nephro Trend BMP  # Hypoglycemia (resolved) Likely from poor PO intake at SNF. Post-treatment glucoses stable. - q4h CBG - Re-dose D10 only if recurrent hypoglycemia - Encourage assistance with meals once cleared for PO  # Vascular Dementia Baseline dependent; worsened encephalopathy in setting of acute illness. Continue memantine  Delirium precautions  # Cough / Left retrocardiac opacity Likely atelectasis; low suspicion for pneumonia. Afebrile, no leukocytosis. Exam limited. Incentive spirometry Monitor for fever, leukocytosis, worsening O? requirement  # PAF / SSS with pacemaker Rate controlled. Continue amiodarone  No AC due to SDH  # HFpEF Appears euvolemic post-HD. Continue monitoring; no acute intervention  Goals of Care / Family Discussion Wife and family actively discussing long-term goals given progressive cognitive decline and recurrent hospitalizations. Continue conversations with family Await final decision regarding code status and overall goals of care Palliative Care consult if family agrees  Best Practice Diet: NPO until bedside swallow IVF: None VTE prophylaxis: SCDs only (no heparin  due to SDH) Code Status: Full (pending family decision) Disposition Planning Therapy: Pending Expected discharge: back to SNF once medically stable and family goals are clarified Barriers: encephalopathy, goals of care decision Family Contact: Wife updated at bedside today DISPO:  Anticipate >1 additional hospital day pending stability and GOC decision Signature:  Lucely Leard Bernad[facility]  6:17 PM, 06/05/2024  On Call pager [phone]

## 2024-06-06 LAB — GLUCOSE, CAPILLARY
Glucose-Capillary: 101 mg/dL — ABNORMAL HIGH (ref 70–99)
Glucose-Capillary: 114 mg/dL — ABNORMAL HIGH (ref 70–99)
Glucose-Capillary: 127 mg/dL — ABNORMAL HIGH (ref 70–99)
Glucose-Capillary: 80 mg/dL (ref 70–99)
Glucose-Capillary: 87 mg/dL (ref 70–99)
Glucose-Capillary: 96 mg/dL (ref 70–99)

## 2024-06-06 LAB — BASIC METABOLIC PANEL WITH GFR
Anion gap: 12 (ref 5–15)
BUN: 27 mg/dL — ABNORMAL HIGH (ref 8–23)
CO2: 29 mmol/L (ref 22–32)
Calcium: 8.4 mg/dL — ABNORMAL LOW (ref 8.9–10.3)
Chloride: 93 mmol/L — ABNORMAL LOW (ref 98–111)
Creatinine, Ser: 7.36 mg/dL — ABNORMAL HIGH (ref 0.61–1.24)
GFR, Estimated: 7 mL/min — ABNORMAL LOW (ref >60)
Glucose, Bld: 75 mg/dL (ref 70–99)
Potassium: 4.2 mmol/L (ref 3.5–5.1)
Sodium: 134 mmol/L — ABNORMAL LOW (ref 135–145)

## 2024-06-06 LAB — CBC
HCT: 29 % — ABNORMAL LOW (ref 39.0–52.0)
Hemoglobin: 9.6 g/dL — ABNORMAL LOW (ref 13.0–17.0)
MCH: 30.7 pg (ref 26.0–34.0)
MCHC: 33.1 g/dL (ref 30.0–36.0)
MCV: 92.7 fL (ref 80.0–100.0)
Platelets: 118 K/uL — ABNORMAL LOW (ref 150–400)
RBC: 3.13 MIL/uL — ABNORMAL LOW (ref 4.22–5.81)
RDW: 14.2 % (ref 11.5–15.5)
WBC: 4.3 K/uL (ref 4.0–10.5)
nRBC: 0 % (ref 0.0–0.2)

## 2024-06-06 MED ORDER — TRAZODONE HCL 50 MG PO TABS
25.0000 mg | ORAL_TABLET | Freq: Every day | ORAL | Status: DC
  Administered 2024-06-06: 25 mg via ORAL
  Filled 2024-06-06: qty 1

## 2024-06-06 MED ORDER — CHLORHEXIDINE GLUCONATE CLOTH 2 % EX PADS: 6.0000 | MEDICATED_PAD | Freq: Every day | CUTANEOUS | Status: DC

## 2024-06-06 MED ORDER — ENSURE PLUS HIGH PROTEIN PO LIQD
237.0000 mL | Freq: Two times a day (BID) | ORAL | Status: DC
  Administered 2024-06-07 – 2024-06-08 (×3): 237 mL via ORAL

## 2024-06-06 NOTE — Progress Notes (Signed)
 [facility] Progress Note  Subjective:  Seen in room Much more alert / awake today Wife is feeding him lunch  Presentation summary: 73 y.o. year-old w/ PMH as below who presented to ED today sent from HD center (did not do HD due to altered MS) for AMS and hypoglycemia. He was rx'd w/ glucagon and d10 IVF's for 1-2 rounds then BS came up to 120. In ED BP 117/75, HR 105, 100% on RA. CXR -> CM w/ no infiltrates, K+ 4.5, bun 44, creat 9.75  alb 3.1 , wBC 6K, Hb 9.5. CT head showed very small SDH (5 mm) at the L anterior falx, old infarcts bilat carebellum, remote lacunar infarcts bilat thalami and no acute CVA. Neurosurg were called and they said there was no indication for surgery. Pt admitted to medical service. We are asked to see for esrd.     Vitals:   06/06/24 0408 06/06/24 0719 06/06/24 1128 06/06/24 1515  BP: 116/62 132/82 (!) 106/55 130/64  Pulse: 74 71 70 70  Resp: 18   16  Temp: (!) 97.4 F (36.3 C) 98.1 F (36.7 C) 97.8 F (36.6 C) 98.1 F (36.7 C)  TempSrc: Oral Oral Oral Oral  SpO2: 100% 100% 100% 100%  Weight:        Exam: Gen alert, more interactive Sclera anicteric, throat clear  No jvd or bruits Chest clear bilat to bases RRR no MRG Abd soft ntnd no mass or ascites +bs Ext no LE edema    LUA AVF+bruit    Home bp meds: Losartan  Aldactone      OP HD: MWF [place] May 19, 2024 -> 3h B450 79.8kg AVG Heparin  none    CXR - CM, no infiltrates CT head -> small SDH at 5 mm, L anterior falx                    - old infarcts bilat carebellum        - remote lacunar infarcts bilat thalami                    - no acute CVA     Assessment/ Plan: Fall / altered mental status: small 5 mm SDH by CT head. Prior strokes showed up on CT but no acute CVA. Was here recently for AMS. H/o dementia. Mentation better today.  ESRD: on HD MWF. Next HD Thursday off schedule.  HTN/volume: bp's soft 100-110 range this afternoon. Hold bp lowering meds. Under  dry wt. Keep even next HD.  Anemia of esrd: Hb 9-10 here, follow.     Myer Fret MD  [facility] 06/06/2024, 3:50 PM  Recent Labs  Lab 06/04/24 1255 06/05/24 0227 06/06/24 0928  HGB 9.5* 9.8* 9.6*  ALBUMIN 3.1*  --   --   CALCIUM  8.9 8.7* 8.4*  CREATININE 9.75* 10.44* 7.36*  K 4.5 4.7 4.2   No results for input(s): IRON, TIBC, FERRITIN in the last 168 hours. Inpatient medications:  acetaminophen   500 mg Oral Daily   amiodarone   200 mg Oral Daily   Chlorhexidine  Gluconate Cloth  6 each Topical Q0600   divalproex   125 mg Oral Q1200   heparin  injection (subcutaneous)  5,000 Units Subcutaneous Q8H   melatonin  3 mg Oral QHS   multivitamin  1 tablet Oral QHS   senna  1 tablet Oral BID     bisacodyl 

## 2024-06-06 NOTE — Progress Notes (Signed)
 Pt refused blood work, son at the bedside.

## 2024-06-06 NOTE — Progress Notes (Addendum)
 HD#1 SUBJECTIVE:  Patient Summary: Gregory Hood is a 73 y.o. 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   Overnight Events: NAEON  Interim History: He was sleeping in bed and briefly opened his eyes to voice. Per his wife, he has been able to eat and appears to be improving. She reports no new cough. Today, she expressed their decision to change his code status to DNR. Palliative care was discussed, but she stated that she is familiar with it and is not interested in pursuing it at this time. OBJECTIVE:  Vital Signs: Vitals:   06/06/24 0408 06/06/24 0719 06/06/24 1128 06/06/24 1515  BP: 116/62 132/82 (!) 106/55 130/64  Pulse: 74 71 70 70  Resp: 18   16  Temp: (!) 97.4 F (36.3 C) 98.1 F (36.7 C) 97.8 F (36.6 C) 98.1 F (36.7 C)  TempSrc: Oral Oral Oral Oral  SpO2: 100% 100% 100% 100%  Weight:       Supplemental O2: Room Air SpO2: 100 %  Filed Weights   06/05/24 1343 06/05/24 1750  Weight: 79.8 kg 76.9 kg     Intake/Output Summary (Last 24 hours) at 06/06/2024 1848 Last data filed at 06/06/2024 0500 Gross per 24 hour  Intake 925.37 ml  Output --  Net 925.37 ml   Net IO Since Admission: -855.69 mL [06/06/24 1848]  Physical Exam: Physical Exam General: Sleepy, chronically ill-appearing; no acute distress. Neuro: Arousable to voice, oriented to self only. Not following commands well. Moves all extremities. Cardiac: RRR, no murmurs. Pulm: Normal WOB on room air; anterior fields clear. Abd: Soft, NTND, +BS. Extremities: No edema; cool but well-perfused; pulses intact. Skin: No new bruising; scalp hematoma improving. Psych: Flat affect; inattentive.  Patient Lines/Drains/Airways Status     Active Line/Drains/Airways     Name Placement date Placement time  Site Days   Peripheral IV 06/06/24 20 G 1.75 Right;Upper Arm 06/06/24  0338  Arm  less than 1   Fistula / Graft Left Upper arm Arteriovenous fistula 03/06/15  1150  Upper arm  3380   Fistula / Graft Left Upper arm 03/22/18  0627  Upper arm  2268            Pertinent labs and imaging:      Latest Ref Rng & Units 06/06/2024    9:28 AM 06/05/2024    2:27 AM 06/04/2024   12:55 PM  CBC  WBC 4.0 - 10.5 K/uL 4.3  4.6  6.8   Hemoglobin 13.0 - 17.0 g/dL 9.6  9.8  9.5   Hematocrit 39.0 - 52.0 % 29.0  30.0  29.3   Platelets 150 - 400 K/uL 118  114  108        Latest Ref Rng & Units 06/06/2024    9:28 AM 06/05/2024    2:27 AM 06/04/2024   12:55 PM  CMP  Glucose 70 - 99 mg/dL 75  78  809   BUN 8 - 23 mg/dL 27  48  44   Creatinine 0.61 - 1.24 mg/dL 2.63  89.55  0.24   Sodium 135 - 145 mmol/L 134  137  137   Potassium 3.5 - 5.1 mmol/L 4.2  4.7  4.5   Chloride 98 - 111 mmol/L 93  97  94   CO2 22 - 32  mmol/L 29  27  28    Calcium  8.9 - 10.3 mg/dL 8.4  8.7  8.9   Total Protein 6.5 - 8.1 g/dL   6.4   Total Bilirubin 0.0 - 1.2 mg/dL   1.2   Alkaline Phos 38 - 126 U/L   66   AST 15 - 41 U/L   25   ALT 0 - 44 U/L   16     No results found.  ASSESSMENT/PLAN:  Assessment: Principal Problem:   Altered mental state Active Problems:   Hypoglycemia   ESRD on dialysis (HCC)   AMS (altered mental status)   Subdural hemorrhage (HCC)   Plan: Gregory Hood is a 73 y.o. 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   # Encephalopathy (multifactorial) Contributors: small SDH, recent hypoglycemia, missed HD, baseline vascular dementia. Today more somnolent but arousable; no focal deficits. Neuro exam stable. - q4h CBG - Neuro checks - Delirium precautions -Continue melatonin 3 mg at  bedtime - Continue to monitor mental status day-to-day  # Subdural Hemorrhage (5 mm falcine, stable) Neurosurgery evaluated; no surgical intervention indicated. Repeat CT only if change in exam. - Hold ASA and Plavix  - Monitor neuro status  # Constipation Patients wife reported ongoing constipation. A suppository was attempted without effect. She stated that enemas have worked best for him in the past, so nursing administered an enema today.  # ESRD on HD (MWF) Nephrology following. Appears euvolemic. Continue HD per Nephrology Hold BP meds per Nephro Trend BMP  # Hypoglycemia (resolved) Likely from poor PO intake at SNF. Post-treatment glucoses stable. - q4h CBG - Re-dose D10 only if recurrent hypoglycemia - Encourage assistance with meals once cleared for PO  # Vascular Dementia Baseline dependent; worsened encephalopathy in setting of acute illness. Continue memantine  Delirium precautions  # Cough / Left retrocardiac opacity Likely atelectasis; low suspicion for pneumonia. Afebrile, no leukocytosis. Exam limited. Incentive spirometry Monitor for fever, leukocytosis, worsening O? requirement  # PAF / SSS with pacemaker Rate controlled. Continue amiodarone  No AC due to SDH  # HFpEF Appears euvolemic post-HD. Continue monitoring; no acute intervention  Goals of Care / Family Discussion Wife and family actively discussing long-term goals given progressive cognitive decline and recurrent hospitalizations. Continue conversations with family Await final decision regarding code status and overall goals of care Palliative Care consult if family agrees  Best Practice Diet: NPO until bedside swallow IVF: None VTE prophylaxis: Heparin  Code Status: DNR Disposition Planning Therapy: Pending Expected discharge: back to SNF once medically stable and family goals are clarified Barriers: encephalopathy, goals of care decision Family Contact: Wife updated at bedside  today DISPO: Anticipate >1 additional hospital day pending stability and GOC decision  Signature:  Gregory [facility]  6:48 PM, 06/06/2024  On Call pager [phone]

## 2024-06-07 ENCOUNTER — Encounter: Payer: Self-pay | Admitting: Internal Medicine

## 2024-06-07 DIAGNOSIS — L89611 Pressure ulcer of right heel, stage 1: Secondary | ICD-10-CM | POA: Diagnosis not present

## 2024-06-07 DIAGNOSIS — Z992 Dependence on renal dialysis: Secondary | ICD-10-CM | POA: Diagnosis not present

## 2024-06-07 DIAGNOSIS — N186 End stage renal disease: Secondary | ICD-10-CM | POA: Diagnosis not present

## 2024-06-07 DIAGNOSIS — Z79899 Other long term (current) drug therapy: Secondary | ICD-10-CM | POA: Diagnosis not present

## 2024-06-07 DIAGNOSIS — F0150 Vascular dementia without behavioral disturbance: Secondary | ICD-10-CM | POA: Diagnosis not present

## 2024-06-07 DIAGNOSIS — I5030 Unspecified diastolic (congestive) heart failure: Secondary | ICD-10-CM | POA: Diagnosis not present

## 2024-06-07 DIAGNOSIS — Z91158 Patient's noncompliance with renal dialysis for other reason: Secondary | ICD-10-CM | POA: Diagnosis not present

## 2024-06-07 DIAGNOSIS — I130 Hypertensive heart and chronic kidney disease with heart failure and stage 1 through stage 4 chronic kidney disease, or unspecified chronic kidney disease: Secondary | ICD-10-CM | POA: Diagnosis not present

## 2024-06-07 DIAGNOSIS — Z950 Presence of cardiac pacemaker: Secondary | ICD-10-CM | POA: Diagnosis not present

## 2024-06-07 DIAGNOSIS — Z7189 Other specified counseling: Secondary | ICD-10-CM

## 2024-06-07 DIAGNOSIS — I6200 Nontraumatic subdural hemorrhage, unspecified: Secondary | ICD-10-CM | POA: Diagnosis not present

## 2024-06-07 DIAGNOSIS — L89621 Pressure ulcer of left heel, stage 1: Secondary | ICD-10-CM

## 2024-06-07 DIAGNOSIS — R918 Other nonspecific abnormal finding of lung field: Secondary | ICD-10-CM | POA: Diagnosis not present

## 2024-06-07 LAB — CBC
HCT: 28.7 % — ABNORMAL LOW (ref 39.0–52.0)
Hemoglobin: 9.5 g/dL — ABNORMAL LOW (ref 13.0–17.0)
MCH: 31.1 pg (ref 26.0–34.0)
MCHC: 33.1 g/dL (ref 30.0–36.0)
MCV: 94.1 fL (ref 80.0–100.0)
Platelets: 117 K/uL — ABNORMAL LOW (ref 150–400)
RBC: 3.05 MIL/uL — ABNORMAL LOW (ref 4.22–5.81)
RDW: 14.4 % (ref 11.5–15.5)
WBC: 4.1 K/uL (ref 4.0–10.5)
nRBC: 0 % (ref 0.0–0.2)

## 2024-06-07 LAB — GLUCOSE, CAPILLARY
Glucose-Capillary: 85 mg/dL (ref 70–99)
Glucose-Capillary: 74 mg/dL (ref 70–99)
Glucose-Capillary: 108 mg/dL — ABNORMAL HIGH (ref 70–99)
Glucose-Capillary: 111 mg/dL — ABNORMAL HIGH (ref 70–99)
Glucose-Capillary: 162 mg/dL — ABNORMAL HIGH (ref 70–99)

## 2024-06-07 LAB — RENAL FUNCTION PANEL
Albumin: 2.8 g/dL — ABNORMAL LOW (ref 3.5–5.0)
Anion gap: 9 (ref 5–15)
BUN: 35 mg/dL — ABNORMAL HIGH (ref 8–23)
CO2: 30 mmol/L (ref 22–32)
Calcium: 8.4 mg/dL — ABNORMAL LOW (ref 8.9–10.3)
Chloride: 94 mmol/L — ABNORMAL LOW (ref 98–111)
Creatinine, Ser: 8.27 mg/dL — ABNORMAL HIGH (ref 0.61–1.24)
GFR, Estimated: 6 mL/min — ABNORMAL LOW (ref >60)
Glucose, Bld: 85 mg/dL (ref 70–99)
Phosphorus: 4.4 mg/dL (ref 2.5–4.6)
Potassium: 4.5 mmol/L (ref 3.5–5.1)
Sodium: 133 mmol/L — ABNORMAL LOW (ref 135–145)

## 2024-06-07 MED ORDER — TRAZODONE HCL 50 MG PO TABS
25.0000 mg | ORAL_TABLET | Freq: Once | ORAL | Status: AC | PRN
  Administered 2024-06-07: 25 mg via ORAL
  Filled 2024-06-07: qty 1

## 2024-06-07 MED ORDER — HEPARIN SODIUM (PORCINE) 1000 UNIT/ML DIALYSIS: 1000.0000 [IU] | INTRAMUSCULAR | Status: DC | PRN

## 2024-06-07 MED ORDER — PENTAFLUOROPROP-TETRAFLUOROETH EX AERO: 1.0000 | INHALATION_SPRAY | CUTANEOUS | Status: DC | PRN

## 2024-06-07 MED ORDER — MUPIROCIN 2 % EX OINT
1.0000 | TOPICAL_OINTMENT | Freq: Two times a day (BID) | CUTANEOUS | Status: DC
  Administered 2024-06-07 – 2024-06-08 (×3): 1 via NASAL
  Filled 2024-06-07: qty 22

## 2024-06-07 MED ORDER — CHLORHEXIDINE GLUCONATE CLOTH 2 % EX PADS
6.0000 | MEDICATED_PAD | Freq: Every day | CUTANEOUS | Status: DC
  Administered 2024-06-07 – 2024-06-08 (×2): 6 via TOPICAL

## 2024-06-07 MED ORDER — ANTICOAGULANT SODIUM CITRATE 4% (200MG/5ML) IV SOLN: 5.0000 mL | Status: DC | PRN

## 2024-06-07 MED ORDER — LIDOCAINE HCL (PF) 1 % IJ SOLN: 5.0000 mL | INTRAMUSCULAR | Status: DC | PRN

## 2024-06-07 MED ORDER — LIDOCAINE-PRILOCAINE 2.5-2.5 % EX CREA: 1.0000 | TOPICAL_CREAM | CUTANEOUS | Status: DC | PRN

## 2024-06-07 MED ORDER — NEPRO/CARBSTEADY PO LIQD: 237.0000 mL | ORAL | Status: DC | PRN

## 2024-06-07 MED ORDER — ALTEPLASE 2 MG IJ SOLR: 2.0000 mg | Freq: Once | INTRAMUSCULAR | Status: DC | PRN

## 2024-06-07 NOTE — Progress Notes (Signed)
 [facility] Progress Note  Subjective:  Seen in room No c/o's   Presentation summary: 73 y.o. year-old w/ PMH as below who presented to ED today sent from HD center (did not do HD due to altered MS) for AMS and hypoglycemia. He was rx'd w/ glucagon and d10 IVF's for 1-2 rounds then BS came up to 120. In ED BP 117/75, HR 105, 100% on RA. CXR -> CM w/ no infiltrates, K+ 4.5, bun 44, creat 9.75  alb 3.1 , wBC 6K, Hb 9.5. CT head showed very small SDH (5 mm) at the L anterior falx, old infarcts bilat carebellum, remote lacunar infarcts bilat thalami and no acute CVA. Neurosurg were called and they said there was no indication for surgery. Pt admitted to medical service. We are asked to see for esrd.     Vitals:   06/06/24 2340 06/07/24 0300 06/07/24 0740 06/07/24 1124  BP: 94/67 114/61 125/66 124/60  Pulse: 73 70 70 70  Resp: 18 18 15    Temp: (!) 97.4 F (36.3 C) 97.7 F (36.5 C) 98.2 F (36.8 C) 98.4 F (36.9 C)  TempSrc: Oral Oral Oral Oral  SpO2: 100% 100% 100% 100%  Weight:        Exam: Gen alert, more interactive Sclera anicteric, throat clear  No jvd or bruits Chest clear bilat to bases RRR no MRG Abd soft ntnd no mass or ascites +bs Ext no LE edema    LUA AVF+bruit    Home bp meds: Losartan  Aldactone      OP HD: MWF [place] May 19, 2024 -> 3h B450 79.8kg AVG Heparin  none    CXR - CM, no infiltrates CT head -> small SDH at 5 mm, L anterior falx                    - old infarcts bilat carebellum        - remote lacunar infarcts bilat thalami                    - no acute CVA     Assessment/ Plan: Fall / altered mental status: small 5 mm SDH by CT head. Prior strokes showed up on CT but no acute CVA. Baseline vasc dementia. Some slow improvement.  ESRD: on HD MWF. Next HD today off schedule.  HTN/volume: bp's soft 100-110 range this afternoon. Hold bp lowering meds. Under dry wt. Keep even next HD.  Anemia of esrd: Hb 9-10 here, follow.      Myer Fret MD  [facility] 06/07/2024, 1:55 PM  Recent Labs  Lab 06/04/24 1255 06/05/24 0227 06/06/24 0928 06/07/24 0327  HGB 9.5*   < > 9.6* 9.5*  ALBUMIN 3.1*  --   --  2.8*  CALCIUM  8.9   < > 8.4* 8.4*  PHOS  --   --   --  4.4  CREATININE 9.75*   < > 7.36* 8.27*  K 4.5   < > 4.2 4.5   < > = values in this interval not displayed.   No results for input(s): IRON, TIBC, FERRITIN in the last 168 hours. Inpatient medications:  acetaminophen   500 mg Oral Daily   amiodarone   200 mg Oral Daily   Chlorhexidine  Gluconate Cloth  6 each Topical Daily   divalproex   125 mg Oral Q1200   feeding supplement  237 mL Oral BID BM   melatonin  3 mg Oral QHS   multivitamin  1 tablet Oral QHS  mupirocin ointment  1 Application Nasal BID   senna  1 tablet Oral BID   traZODone   25 mg Oral QHS     bisacodyl 

## 2024-06-07 NOTE — Discharge Summary (Incomplete)
 Name: Gregory Hood MRN: [id number] DOB: 26-Mar-1951 73 y.o. PCP: Valma Carwin, MD  Date of Admission: 06/04/2024 11:20 AM Date of Discharge: 06/08/2024 Attending Physician: Dr. Mliss Pouch  Discharge Diagnosis: 1. Principal Problem:   Altered mental state Active Problems:   Hypoglycemia   Vascular dementia (HCC)   ESRD on dialysis (HCC)   Subdural hemorrhage (HCC)   Pressure injury of both heels, stage 1  Discharge Medications: Allergies as of 06/08/2024       Reactions   Bee Venom Swelling   Ace Inhibitors Cough   Lexiscan  [regadenoson ] Other (See Comments)   Seizure like-activity after lexiscan  given.   Statins Other (See Comments)   Other reaction(s): Other (See Comments) Myalgias and CK increase.  Tried both atorvastatin  and rosuvastatin         Medication List     STOP taking these medications    amiodarone  200 MG tablet Commonly known as: PACERONE    clopidogrel  75 MG tablet Commonly known as: PLAVIX    finasteride  5 MG tablet Commonly known as: PROSCAR        TAKE these medications    acetaminophen  500 MG tablet Commonly known as: TYLENOL  Take 500 mg by mouth daily.   aspirin  81 MG tablet Take 1 tablet (81 mg total) by mouth at bedtime.   atropine  1 % ophthalmic solution Place 1 drop into both eyes daily at 12 noon.   bisacodyl  10 MG suppository Commonly known as: DULCOLAX Place 10 mg rectally as needed for moderate constipation.   calcium  acetate 667 MG capsule Commonly known as: PHOSLO  Take 667 mg by mouth 3 (three) times daily.   divalproex  125 MG DR tablet Commonly known as: DEPAKOTE  Take 125 mg by mouth daily at 12 noon.   feeding supplement Liqd Take 237 mLs by mouth 2 (two) times daily between meals.   GLUCAGON EMERGENCY IJ Inject 1 mg into the muscle as needed (blood sugar less than 70).   Glucose 77.4 % Gel Take 1 Dose by mouth as needed (blood glucose less than 70).   lidocaine  4 % Place 1 patch onto the skin  daily.   lidocaine -prilocaine  cream Commonly known as: EMLA  Apply 1 Application topically as needed. 2.5 - 2.5% Cream   losartan  100 MG tablet Commonly known as: COZAAR  Take 1 tablet (100 mg total) by mouth at bedtime.   melatonin 3 MG Tabs tablet Take 3 mg by mouth at bedtime.   multivitamin Tabs tablet Take 1 tablet by mouth at bedtime.   spironolactone  50 MG tablet Commonly known as: ALDACTONE  Take 1 tablet by mouth daily.       Disposition and follow-up:   Gregory Hood was discharged from [facility] in Stable condition.  At the hospital follow up visit please address:  Encephalopathy / Vascular Dementia Assess mental status relative to recent baseline and ongoing functional decline. Review feeding assistance and hypoglycemia prevention plan at SNF.  Subdural Hemorrhage (5 mm falcine, managed conservatively) Monitor for any new neurologic symptoms. Confirm continuation of aspirin  only and discontinuation of clopidogrel .  Hypoglycemia (resolved) Review glucose monitoring schedule and ensure regular assisted meals. Adjust diabetes regimen if recurrent hypoglycemia occurs.  ESRD on Hemodialysis Confirm adherence and tolerance of MWF dialysis. Review blood pressure trends and volume status.  Atrial Fibrillation / Pacemaker Reassess need for amiodarone , as patient is pacemaker-dependent and rate controlled. Anticoagulation remains contraindicated due to recent SDH.  Pressure Injuries (Stage 1 bilateral heels) Ensure ongoing offloading and pressure-injury prevention at  SNF.  Goals of Care Continue discussions regarding long-term goals, dialysis benefit, and comfort-focused care as dementia progresses.  2.  Labs / imaging needed at time of follow-up: CBC, BMP (at provider's discretion)  3.  Pending labs/ test needing follow-up: None  Follow-up Appointments:  Follow-up Information     Valma Carwin, MD. Call today.   Specialty: Internal  Medicine Why: Hospital Follow Up Contact information: [id number] [place] [address] [phone]                Hospital Course by problem list: Gregory Hood. 56, a 73 year old man with advanced vascular dementia and ESRD on hemodialysis, was brought from his dialysis center after he arrived profoundly hypoglycemic and more confused than usual. Over his hospital stay, each problem told a piece of the larger story of why he had declined so suddenly.  # Encephalopathy (multifactorial) Gregory Hood arrived barely responsive, opening his eyes only briefly and unable to engage in conversation. His wife reported that he had been increasingly somnolent over the prior days, missing meals and not receiving routine blood glucose checks at his SNF. As his evaluation unfolded, it became clear that his encephalopathy was multifactorial, driven by severe hypoglycemia, missed dialysis, and a recent fall with head trauma, all superimposed on advanced vascular dementia.  With stabilization of glucose levels, gentle supportive care, and hemodialysis, his alertness improved modestly. However, he did not return to a reliable cognitive baseline, consistent with progression of his underlying dementia rather than a fully reversible acute process.  # Subdural Hemorrhage (acute, 5 mm falcine) CT imaging revealed a small acute falcine subdural hematoma, likely related to an unwitnessed forward fall from his wheelchair earlier that morning. Neurosurgery reviewed the imaging and recommended conservative management without surgical intervention.  Both aspirin  and clopidogrel  were initially held during the acute period. His neurologic examinations remained stable, with no evidence of expansion or clinical deterioration. Given stability of the hemorrhage and his cardiovascular history, aspirin  was resumed, while clopidogrel  was discontinued permanently due to bleeding risk outweighing benefit. He remained clinically  stable without recurrent neurologic events.  # Hypoglycemia (severe, resolved) The episode prompting transfer was profound hypoglycemia, with fingerstick glucose in the 20s, requiring glucagon and multiple D10 boluses. His wife noted poor oral intake at the SNF and inconsistent feeding assistance.  Once placed on a dysphagia-appropriate diet and provided consistent feeding support, his glucose levels stabilized. Scheduled blood glucose monitoring showed no further hypoglycemic episodes, supporting functional dependence and missed intake as the primary cause.  # ESRD on Hemodialysis (MWF) Gregory Hood had missed his scheduled dialysis session prior to admission due to altered mental status. Although electrolytes were acceptable, missed dialysis likely contributed to his encephalopathy. Hemodialysis was resumed the following day and was well tolerated.  Blood pressures remained soft, prompting discontinuation of antihypertensives. He remained euvolemic for the remainder of the hospitalization. Ongoing discussions with family addressed the balance between dialysis benefit and quality of life as dementia progresses.  # Vascular Dementia (advanced) His advanced vascular dementia framed every aspect of his hospital course. Even with correction of metabolic derangements, he remained oriented only to self, inattentive, and unable to follow commands consistently. His wife described a gradual but clear functional and cognitive decline over recent months, which was evident throughout his admission.  # Cough / Left Retrocardiac Opacity Chest imaging showed a left retrocardiac opacity with a small-to-moderate pleural effusion. In the absence of fever, leukocytosis, hypoxia, or respiratory distress, this was felt to represent  atelectasis rather than pneumonia. He remained clinically stable with supportive care and incentive spirometry encouragement.  # Paroxysmal Atrial Fibrillation / Sick Sinus Syndrome s/p  Pacemaker He maintained a ventricular-paced rhythm throughout hospitalization without hemodynamic instability. Given the acute subdural hematoma, anticoagulation was not appropriate. Home amiodarone  was continued without issue.  # Heart Failure with Preserved EF He was euvolemic on presentation and throughout admission. Hemodialysis removed 2 L without complication. No diuretics were required.  # Constipation He had not had an adequate bowel movement prior to admission. After limited response to suppository, a soapsuds enema resulted in symptomatic relief. He resumed his bowel regimen thereafter.  # Anemia of ESRD Hemoglobin remained stable in the expected ESRD range (9-10 g/dL). No transfusion was indicated.  # Pressure Injuries (Stage 1, bilateral heels) Early pressure-related skin changes were noted on both heels. Offloading measures and protective padding were implemented, with clear instructions for continuation at the SNF.  # Goals of Care and Code Status A pivotal aspect of this hospitalization involved goals-of-care discussions. His wife reflected on his progressive decline and vulnerability. After thoughtful discussion, she elected to change his code status to DNR. While she declined inpatient palliative care consultation, she understands that future discussions regarding dialysis continuation and comfort-focused care will be important.   Subjective The patient was resting in bed, sleepy but easily arousable, and in no acute distress. He was oriented to self only and unable to provide reliable history. His wife was at bedside and reported that he appeared closer to his recent baseline, though still more somnolent and confused than months prior. He denied pain when prompted and showed no signs of respiratory distress or acute discomfort.  Discharge Exam:   BP 123/63 (BP Location: Right Arm)   Pulse 70   Temp (!) 97.4 F (36.3 C) (Oral)   Resp 16   Wt 83 kg   SpO2 98%   BMI 27.02  kg/m  Discharge exam:  General: Sleepy, chronically ill-appearing; no acute distress. Neuro: Arousable to voice, oriented to self only. No spontaneous speech. Not following commands well. Moves all extremities. Cardiac: Systolic 3/6 murmur over left sternal border.  Pulm: Normal WOB on room air; anterior fields clear. Abd: Soft, NTND, +BS. Extremities: No edema; cool but well-perfused; pulses intact. Skin: No new bruising; forehead hematoma improving. Psych: Flat affect; inattentive.   Pertinent Labs, Studies, and Procedures:     Latest Ref Rng & Units 06/07/2024    3:27 AM 06/06/2024    9:28 AM 06/05/2024    2:27 AM  CBC  WBC 4.0 - 10.5 K/uL 4.1  4.3  4.6   Hemoglobin 13.0 - 17.0 g/dL 9.5  9.6  9.8   Hematocrit 39.0 - 52.0 % 28.7  29.0  30.0   Platelets 150 - 400 K/uL 117  118  114       Latest Ref Rng & Units 06/07/2024    3:27 AM 06/06/2024    9:28 AM 06/05/2024    2:27 AM  CMP  Glucose 70 - 99 mg/dL 85  75  78   BUN 8 - 23 mg/dL 35  27  48   Creatinine 0.61 - 1.24 mg/dL 1.72  2.63  89.55   Sodium 135 - 145 mmol/L 133  134  137   Potassium 3.5 - 5.1 mmol/L 4.5  4.2  4.7   Chloride 98 - 111 mmol/L 94  93  97   CO2 22 - 32 mmol/L 30  29  27  Calcium  8.9 - 10.3 mg/dL 8.4  8.4  8.7    CT Head Wo Contrast Addendum Date: 06/04/2024 ** ADDENDUM #1 ** ADDENDUM: Findings discussed with Dr. Charlyn at 2:33 PM on 06/04/24. ---------------------------------------------------- Electronically signed by: Donnice Mania MD 06/04/2024 09:01 PM EST RP Workstation: [id number]   Result Date: 06/04/2024 ** ORIGINAL REPORT ** EXAM: CT HEAD WITHOUT CONTRAST 06/04/2024 01:51:54 PM TECHNIQUE: CT of the head was performed without the administration of intravenous contrast. Automated exposure control, iterative reconstruction, and/or weight based adjustment of the mA/kV was utilized to reduce the radiation dose to as low as reasonably achievable. COMPARISON: 06/01/2024 and 05/14/2024. CLINICAL  HISTORY: Mental status change, unknown cause. FINDINGS: BRAIN AND VENTRICLES: There is a focus of subdural hemorrhage along the left aspect of the anterior falx which measures up to 5 mm in thickness best appreciated on series 5 image 53. Remote infarcts in the bilateral cerebellum. Nonspecific hypoattenuation in the periventricular and subcortical white matter, most likely representing chronic microvascular ischemic changes. Generalized parenchymal volume loss with associated prominence of the ventricles. Remote lacunar infarcts in the bilateral thalami. Atherosclerosis of the skull base vessels. No evidence of acute infarct. No hydrocephalus. No mass effect or midline shift. ORBITS: Right lens replacement. Left phthisis bulbi. SINUSES: No acute abnormality. SOFT TISSUES AND SKULL: Prominent atherosclerosis of multiple vessels in the scalp. Mild generalized edema of the subcutaneous tissues in the scalp. Small midline frontal scalp hematoma. No skull fracture. IMPRESSION: 1. Focal acute subdural hemorrhage along the left aspect of the anterior falx measuring up to 5 mm in thickness. 2. Small midline frontal scalp hematoma, decreased since 06/01/24. 3. Remote infarcts in the bilateral cerebellum and bilateral thalami. 4. Moderate chronic microvascular ischemic change and generalized parenchymal volume loss. Electronically signed by: Donnice Mania MD 06/04/2024 02:29 PM EST RP Workstation: [id number]   DG Chest Port 1 View Result Date: 06/04/2024 CLINICAL DATA:  Sepsis EXAM: PORTABLE CHEST 1 VIEW COMPARISON:  Chest radiograph dated 05/17/2024 FINDINGS: Lines/tubes: Right chest wall pacemaker leads project over the right atrium and ventricle. Lungs: Well inflated lungs. Diffuse interstitial opacities. Dense left retrocardiac opacity. Pleura: Small to moderate left pleural effusion.  No pneumothorax. Heart/mediastinum: The heart size and mediastinal contours are within normal limits. Bones: No acute osseous  abnormality. IMPRESSION: 1. Small to moderate left pleural effusion with dense left retrocardiac opacity, which may represent atelectasis, aspiration, or pneumonia. 2. Diffuse interstitial opacities, which may represent pulmonary edema. Electronically Signed   By: Limin  Xu M.D.   On: 06/04/2024 14:22    Discharge Instructions:  Signed: Bernadine Manos, MD 06/08/2024, 11:09 AM

## 2024-06-07 NOTE — Progress Notes (Addendum)
 HD#2 SUBJECTIVE:  Patient Summary: Gregory Hood is a 73 y.o. 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   Overnight Events: per night team patient had episode of mild agitation last night but well controlled after one dose of trazodone .   Interim History: He was sleeping in bed and briefly opened his eyes to voice. Called wife and explained regarding his discharge and aslo the fact that he wont benefit of staying in th hospital, will have HD today and discharge him tomorrow morning.  OBJECTIVE:  Vital Signs: Vitals:   06/07/24 1630 06/07/24 1700 06/07/24 1730 06/07/24 1800  BP: 127/67 122/64 130/66 (!) 117/57  Pulse: 70 69 69 72  Resp: 15 12 10    Temp:      TempSrc:      SpO2: 100% 100% 100% 100%  Weight:       Supplemental O2: Room Air SpO2: 100 %  Filed Weights   06/05/24 1343 06/05/24 1750 06/07/24 1452  Weight: 79.8 kg 76.9 kg 83 kg     Intake/Output Summary (Last 24 hours) at 06/07/2024 1834 Last data filed at 06/07/2024 1100 Gross per 24 hour  Intake 240 ml  Output --  Net 240 ml   Net IO Since Admission: -615.69 mL [06/07/24 1834]  Physical Exam: Physical Exam General: Sleepy, chronically ill-appearing; no acute distress. Neuro: Arousable to voice, oriented to self only. Not following commands well. Moves all extremities. Cardiac: RRR, Systolic 3/6 murmur over left sternal border. Pulm: Normal WOB on room air; anterior fields clear. Abd: Soft, NTND, +BS. Extremities: No edema; cool but well-perfused; pulses intact. Skin: No new bruising; scalp hematoma improving. Bilateral heel stage 1 pressure injury.  Psych: Flat affect; inattentive.   Patient Lines/Drains/Airways Status     Active Line/Drains/Airways     Name Placement date Placement time Site  Days   Peripheral IV 06/06/24 20 G 1.75 Right;Upper Arm 06/06/24  0338  Arm  1   Fistula / Graft Left Upper arm Arteriovenous fistula 03/06/15  1150  Upper arm  3381   Fistula / Graft Left Upper arm 03/22/18  0627  Upper arm  2269            Pertinent labs and imaging:      Latest Ref Rng & Units 06/07/2024    3:27 AM 06/06/2024    9:28 AM 06/05/2024    2:27 AM  CBC  WBC 4.0 - 10.5 K/uL 4.1  4.3  4.6   Hemoglobin 13.0 - 17.0 g/dL 9.5  9.6  9.8   Hematocrit 39.0 - 52.0 % 28.7  29.0  30.0   Platelets 150 - 400 K/uL 117  118  114        Latest Ref Rng & Units 06/07/2024    3:27 AM 06/06/2024    9:28 AM 06/05/2024    2:27 AM  CMP  Glucose 70 - 99 mg/dL 85  75  78   BUN 8 - 23 mg/dL 35  27  48   Creatinine 0.61 - 1.24 mg/dL 1.72  2.63  89.55   Sodium 135 - 145 mmol/L 133  134  137   Potassium 3.5 - 5.1 mmol/L 4.5  4.2  4.7   Chloride 98 - 111 mmol/L 94  93  97   CO2 22 - 32 mmol/L  30  29  27    Calcium  8.9 - 10.3 mg/dL 8.4  8.4  8.7     No results found.  ASSESSMENT/PLAN:  Assessment: Principal Problem:   Altered mental state Active Problems:   Hypoglycemia   Vascular dementia (HCC)   ESRD on dialysis (HCC)   Subdural hemorrhage (HCC)   Pressure injury of both heels, stage 1   Plan: Gregory Hood is a 73 y.o. 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   # Encephalopathy (multifactorial) Contributors: small SDH, recent hypoglycemia, missed HD, baseline vascular dementia. Today more somnolent but arousable; no focal deficits. Neuro exam stable. - q4h CBG - Neuro checks - Delirium precautions - Continue melatonin 3 mg at bedtime - Continue to monitor mental status day-to-day  # Subdural Hemorrhage (5 mm falcine, stable) Stable. Neurosurgery evaluated; no  surgical intervention indicated. Repeat CT only if change in exam. - Hold ASA and Plavix  - Monitor neuro status   # Constipation Resolved. Patients wife reported ongoing constipation. A suppository was attempted without effect. She stated that enemas have worked best for him in the past, so nursing administered an enema today.  # Stage One Heel Pressure Injury On examination, found to have bilateral heel non blanching skin pressure injury.   - Heel cushions  # ESRD on HD (MWF) Nephrology following. Appears euvolemic. Continue HD per Nephrology Hold BP meds per Nephro Trend BMP  # Hypoglycemia (resolved) Resolved. Likely from poor PO intake at SNF. Post-treatment glucoses stable. - q4h CBG - Re-dose D10 only if recurrent hypoglycemia - Encourage assistance with meals once cleared for PO  # Vascular Dementia Baseline dependent; worsened encephalopathy in setting of acute illness. Continue memantine  Delirium precautions  # Cough / Left retrocardiac opacity Resolved. Likely atelectasis; low suspicion for pneumonia. Afebrile, no leukocytosis. Exam limited. Incentive spirometry Monitor for fever, leukocytosis, worsening O? requirement  # PAF / SSS with pacemaker Rate controlled. Continue amiodarone  No AC due to SDH  # HFpEF Appears euvolemic post-HD. Continue monitoring; no acute intervention  Goals of Care / Family Discussion Wife and family actively discussing long-term goals given progressive cognitive decline and recurrent hospitalizations. Continue conversations with family Await final decision regarding code status and overall goals of care Palliative Care consult if family agrees  Best Practice Diet: Dysphagia 2 IVF: None VTE prophylaxis: Heparin  Code Status: DNR Disposition Planning Expected discharge: back to SNF once medically stable and family goals are clarified Family Contact: Wife updated by phone call DISPO: Anticipate 1 additional hospital day  pending stability and GOC decision Signature:  Mliss Arlean Trudy Jolynn [facility]  6:34 PM, 06/07/2024  On Call pager [phone]

## 2024-06-07 NOTE — Plan of Care (Signed)

## 2024-06-08 LAB — GLUCOSE, CAPILLARY
Glucose-Capillary: 88 mg/dL (ref 70–99)
Glucose-Capillary: 124 mg/dL — ABNORMAL HIGH (ref 70–99)
Glucose-Capillary: 107 mg/dL — ABNORMAL HIGH (ref 70–99)

## 2024-06-08 MED ORDER — ASPIRIN 81 MG PO CHEW
81.0000 mg | CHEWABLE_TABLET | Freq: Every day | ORAL | Status: DC
  Administered 2024-06-08: 81 mg via ORAL
  Filled 2024-06-08: qty 1

## 2024-06-08 MED ORDER — RENA-VITE PO TABS: 1.0000 | ORAL_TABLET | Freq: Every day | ORAL | Status: AC

## 2024-06-08 MED ORDER — ENSURE PLUS HIGH PROTEIN PO LIQD: 237.0000 mL | Freq: Two times a day (BID) | ORAL | Status: AC

## 2024-06-08 MED ORDER — CHLORHEXIDINE GLUCONATE CLOTH 2 % EX PADS: 6.0000 | MEDICATED_PAD | Freq: Every day | CUTANEOUS | Status: DC

## 2024-06-08 NOTE — Progress Notes (Signed)
 Pts spouse calling up to unit very upset to hear that pt is down at dialysis, given that he received dialysis yesterday. Staff tried to explain to pts wife that he is on M,W,F dialysis schedule and that he is just resuming his normal schedule before being DC back to facility. Pts wife stating that she wants to speak with provider. Nephro provider Dr. Keren made aware of situation and asked to speak with pts wife. Pts wife continues to call back to unit wanting to speak with provider. Pts wife told that provider has been made aware and will call her when he gets a chance

## 2024-06-08 NOTE — Discharge Instructions (Addendum)
 You were admitted to the hospital for low blood sugar, confusion, and a fall that caused a small bleed in your brain. During your stay, your blood sugar was stabilized, dialysis was restarted, and your condition improved to your recent baseline. You are now stable for discharge.  Medications STOP: Plavix  (clopidogrel ) stopped due to bleeding risk Amiodarone  (He has the pacemaker) Finasteride  (He does not make urine)  Continue: Aspirin  as prescribed Other home medications unless otherwise instructed Do NOT start any new blood thinners unless directed by your doctor. Avoid sedating medications unless clearly needed.  Blood Sugar Check blood sugar before meals and at bedtime. Ensure regular meals with feeding assistance. Notify a provider for low blood sugar (confusion, shakiness, sweating).  Dialysis Resume regular hemodialysis schedule (MWF). Do not miss dialysis sessions. Notify nephrology for missed sessions, low blood pressure, or intolerance.  Activity & Safety High risk for falls - assist with all transfers and mobility. Use fall precautions at all times. Avoid activities that increase fall risk.  Neurologic Monitoring A small brain bleed was managed without surgery. Seek urgent care for worsening confusion, severe headache, repeated vomiting, or new weakness.  Skin & Pressure Injury Care Continue heel offloading and pressure-injury prevention. Reposition frequently and keep skin clean and dry.  Bowel Care Continue bowel regimen as ordered to prevent constipation.  When to Seek Medical Care Go to the ER or call 911 for: Chest pain or shortness of breath New or worsening confusion Signs of low blood sugar not improving with food New weakness, speech difficulty, or severe headache Call your doctor for: Fever Missed dialysis Poor oral intake or recurrent low blood sugars  Follow-Up Primary Care: as scheduled Nephrology/Dialysis Center: continue routine  care Neurology/Neurosurgery: follow up as recommended  It was a pleasure taking care of you. We wish you comfort and safety, and please reach out with any questions or concerns.

## 2024-06-08 NOTE — TOC Transition Note (Signed)
 Transition of Care [facility]) - Discharge Note   Patient Details  Name: Gregory Hood MRN: [id number] Date of Birth: 02-24-1951  Transition of Care [facility]) CM/SW Contact:  [name], LCSW Phone Number: 06/08/2024, 2:56 PM   Clinical Narrative:   CSW updated by MD that patient stable to return to SNF today. CSW sent discharge information and confirmed that [facility] was ready for patient to return. CSW later received an update from Renal Navigator that patient's outpatient clinic was reporting an issue with stretcher transport. CSW coordinated with SNF and renal navigator to confirm that patient does not need stretcher dialysis and can return to clinic on Monday.   CSW spoke with spouse, Fronie, and she is in agreement with discharge to SNF. Spouse was not happy about the patient having HD again today, wants to make sure the patient is ok upon his return from HD and has a meal before transport arrives. CSW contacted [facility] to request for transport after 3:30, will push back if needed. Spouse also wants to be contacted when the patient is picked up, contact information placed on packet.  Nurse to call report to [phone], Room 301.    Final next level of care: Skilled Nursing Facility Barriers to Discharge: Barriers Resolved   Patient Goals and CMS Choice            Discharge Placement              Patient chooses bed at:  [facility]) Patient to be transferred to facility by: [facility] Name of family member notified: [name] Patient and family notified of of transfer: 06/08/24  Discharge Plan and Services Additional resources added to the After Visit Summary for                                       Social Drivers of Health (SDOH) Interventions SDOH Screenings   Food Insecurity: Patient Unable To Answer (06/05/2024)  Housing: Low Risk (06/05/2024)  Transportation Needs: No Transportation Needs (06/05/2024)  Utilities: Not At Risk (06/05/2024)  Social Connections:  Unknown (06/06/2024)  Tobacco Use: Medium Risk (06/04/2024)     Readmission Risk Interventions     No data to display

## 2024-06-08 NOTE — Plan of Care (Signed)

## 2024-06-08 NOTE — Progress Notes (Signed)
 [facility] Progress Note  Subjective:  Seen in room No c/o's   Presentation summary: 73 y.o. year-old w/ PMH as below who presented to ED today sent from HD center (did not do HD due to altered MS) for AMS and hypoglycemia. He was rx'd w/ glucagon and d10 IVF's for 1-2 rounds then BS came up to 120. In ED BP 117/75, HR 105, 100% on RA. CXR -> CM w/ no infiltrates, K+ 4.5, bun 44, creat 9.75  alb 3.1 , wBC 6K, Hb 9.5. CT head showed very small SDH (5 mm) at the L anterior falx, old infarcts bilat carebellum, remote lacunar infarcts bilat thalami and no acute CVA. Neurosurg were called and they said there was no indication for surgery. Pt admitted to medical service. We are asked to see for esrd.     Vitals:   06/08/24 1330 06/08/24 1339 06/08/24 1344 06/08/24 1444  BP: 124/68 124/78 123/70 121/66  Pulse: 70 71 70 70  Resp: 16 14 14 16   Temp:  97.6 F (36.4 C)  97.8 F (36.6 C)  TempSrc:  Axillary  Oral  SpO2: 100% 100% 100% 100%  Weight:        Exam: Gen alert, more interactive Sclera anicteric, throat clear  No jvd or bruits Chest clear bilat to bases RRR no MRG Abd soft ntnd no mass or ascites +bs Ext no LE edema    LUA AVF+bruit    Home bp meds: Losartan  Aldactone      OP HD: MWF [place] May 19, 2024 -> 3h B450 79.8kg AVG Heparin  none    CXR - CM, no infiltrates   Assessment/ Plan: Fall / altered mental status: small 5 mm SDH by CT head. Prior strokes showed up on CT but no acute CVA. Baseline vasc dementia. Some slow improvement.  ESRD: on HD MWF. Had HD here Tuesday and Thursday off schedule. Pt's wife refused him to have another HD today due to being back to back. He had already started ( ) then was rinsed back.  HTN/volume: BP stable 110-120 sbp. Under dry wt. Keep even next HD.  Anemia of esrd: Hb 9-10 here, follow.  Dispo: for dc today, going back to SNF     Myer Fret MD  [facility] 06/08/2024, 4:01 PM  Recent Labs  Lab 06/04/24 1255  06/05/24 0227 06/06/24 0928 06/07/24 0327  HGB 9.5*   < > 9.6* 9.5*  ALBUMIN 3.1*  --   --  2.8*  CALCIUM  8.9   < > 8.4* 8.4*  PHOS  --   --   --  4.4  CREATININE 9.75*   < > 7.36* 8.27*  K 4.5   < > 4.2 4.5   < > = values in this interval not displayed.   No results for input(s): IRON, TIBC, FERRITIN in the last 168 hours. Inpatient medications:  acetaminophen   500 mg Oral Daily   aspirin   81 mg Oral Daily   Chlorhexidine  Gluconate Cloth  6 each Topical Daily   Chlorhexidine  Gluconate Cloth  6 each Topical Q0600   divalproex   125 mg Oral Q1200   feeding supplement  237 mL Oral BID BM   melatonin  3 mg Oral QHS   multivitamin  1 tablet Oral QHS   mupirocin ointment  1 Application Nasal BID   senna  1 tablet Oral BID     bisacodyl 

## 2024-06-08 NOTE — Progress Notes (Addendum)
 D/C order noted. Contacted [facility] to be advised of pt's d/c today and that pt should resume care on Monday.   Randine Mungo Dialysis Navigator [phone]  Addendum at 12:57 pm: Received call from pt's out-pt HD clinic stating they received a call from pt's snf stating pt will need to be transported to HD appts via stretcher at d/c. Clinic says they cannot accept pt back as a pt if pt requires stretcher transport to/from HD. Pt required sitters with HD treatments prior to admission and clinic cannot accommodate pt if stretcher transport is needed or required. Clinic states this makes pt not appropriate for out-pt HD care. Clinic states this situation will need to be resolved prior to pt being d/c and resuming at clinic. Navigator made attending, nephrologist, renal PA, and CSW aware of this information so it can be addressed prior to d/c.   Addendum at 2:13 pm: CSW and navigator spoke after CSW heard back from snf. SNF informed CSW that they did not call the clinic to say that pt will be transported by stretcher but was asking in case that is needed in the future and what would that mean/look like. Navigator called clinic manager back and explained SNF response to him. Clinic manager was advised that medical staff have stated there is no reason for pt to need to be transported by stretcher at this time and that snf plans to transport pt via w/c transport as prior to admission. Clinic manager voices understanding and agreeable to pt resuming on Monday. Update provided to attending, nephrologist, renal PA, and CSW.

## 2024-06-08 NOTE — Progress Notes (Signed)
 Speech Language Pathology Treatment: Dysphagia  Patient Details Name: Gregory Hood MRN: [id number] DOB: April 09, 1951 Today's Date: 06/08/2024 Time: 8957-8949 SLP Time Calculation (min) (ACUTE ONLY): 8 min  Assessment / Plan / Recommendation Clinical Impression  SLP conducted skilled therapy session targeting dysphagia goals. Patient endorses no difficulty with current Dys2/thin liquid diet. SLP administered Dys2, Dys3, and thin liquids. With Dys3 solids, patient exhibited prolonged mastication and mild diffuse lingual residue which cleared with liquid wash. Patient tolerates Dys2/thin liquid diet at baseline PTA. No overt difficulty nor s/sx of penetration/aspiration observed across baseline consistencies. Recommend continuation of Dys2/thin liquid diet. Patient was left in room with call bell in reach and alarm set. No further SLP services indicated. SLP will sign off.   HPI HPI: Gregory Hood is a 73 y.o. male who presented 12/8 from SNF for evaluation of AMS. Pt had had fall at [facility] 12/5 in which he struck his head.  Head CT 12/8: Focal acute subdural hemorrhage along the left aspect of the anterior falx  measuring up to 5 mm in thickness.  CXR 12/8: Small to moderate left pleural effusion with dense left retrocardiac opacity, which may represent atelectasis, aspiration, or pneumonia.  Pt is known to this service from prior admission at end of November with pna noted at that time, with recommendations to continue dysphagia 2 diet with thin liquids, which is his baseline diet at his facility.  Per chart review pt has had decreased PO intake, leading to hypoglycemic events. Pt with PMHx of ESRD on HD (MWF), vascular dementia 2/2 multiple prior CVAs, PAF, HFpEF      SLP Plan  All goals met;Discharge SLP treatment due to (comment)        Swallow Evaluation Recommendations   Recommendations: PO diet PO Diet Recommendation: Dysphagia 2 (Finely chopped);Thin liquids (Level 0) Liquid  Administration via: Straw Medication Administration: Whole meds with liquid Supervision: Patient able to self-feed Postural changes: Position pt fully upright for meals Oral care recommendations: Oral care BID (2x/day)     Recommendations                     Oral care BID   Frequent or constant Supervision/Assistance Dysphagia, oral phase (R13.11)     All goals met;Discharge SLP treatment due to (comment)     Leshea Jaggers A Silus Lanzo  06/08/2024, 1:27 PM

## 2024-06-08 NOTE — NC FL2 (Signed)
 [place]  MEDICAID FL2 LEVEL OF CARE FORM     IDENTIFICATION  Patient Name: Gregory Hood Birthdate: 25-Jul-1950 Sex: male Admission Date (Current Location): 06/04/2024  [facility] and [place] Number:  [employer] and Address:  [facility], [address] [place], [place] [address]      Provider Number: [id number]  Attending Physician Name and Address:  Trudy Mliss Dragon, MD  Relative Name and Phone Number:       Current Level of Care: Hospital Recommended Level of Care: Skilled Nursing Facility Prior Approval Number:    Date Approved/Denied:   PASRR Number:    Discharge Plan: SNF    Current Diagnoses: Patient Active Problem List   Diagnosis Date Noted   Pressure injury of both heels, stage 1 06/07/2024   Altered mental state 06/04/2024   Subdural hemorrhage (HCC) 06/04/2024   Hypoglycemia 05/15/2024   Vascular dementia (HCC) 05/15/2024   ESRD on dialysis (HCC) 05/15/2024   HFrEF (heart failure with reduced ejection fraction) (HCC) 05/14/2024   SSS (sick sinus syndrome) (HCC) 05/14/2024   Polypharmacy 05/14/2024   Cholecystitis 11/11/2023   Wide-complex tachycardia 10/08/2020   History of completed stroke 07/11/2018   Hypertensive urgency 07/11/2018   CVA (cerebral vascular accident) (HCC) 03/22/2018   OSA (obstructive sleep apnea) 03/22/2018   BPH (benign prostatic hyperplasia) 03/22/2018   Thrombocytopenia 03/22/2018   Normocytic anemia 03/22/2018   End stage renal disease (HCC) 03/14/2014   Orthostatic hypotension 02/22/2014   Elevated troponin 02/22/2014   Chronic diastolic heart failure (HCC) 02/22/2014   Elevated lipids 02/22/2014   Essential hypertension, benign 02/17/2014   DM (diabetes mellitus), 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    Orientation RESPIRATION BLADDER Height & Weight     Self  Normal Incontinent Weight: 182 lb 15.7 oz (83 kg) Height:     BEHAVIORAL SYMPTOMS/MOOD NEUROLOGICAL BOWEL  NUTRITION STATUS      Incontinent Diet (see DC summary)  AMBULATORY STATUS COMMUNICATION OF NEEDS Skin   Extensive Assist Verbally Normal                       Personal Care Assistance Level of Assistance  Bathing, Feeding, Dressing Bathing Assistance: Maximum assistance Feeding assistance: Maximum assistance Dressing Assistance: Maximum assistance     Functional Limitations Info  Sight Sight Info: Impaired        SPECIAL CARE FACTORS FREQUENCY                       Contractures Contractures Info: Not present    Additional Factors Info  Code Status, Allergies Code Status Info: DNR Allergies Info: Bee Venom, Ace Inhibitors, Lexiscan  (Regadenoson ), Statins           Current Medications (06/08/2024):  This is the current hospital active medication list Current Facility-Administered Medications  Medication Dose Route Frequency Provider Last Rate Last Admin   acetaminophen  (TYLENOL ) tablet 500 mg  500 mg Oral Daily Juberg, Christopher, DO   500 mg at 06/08/24 9057   aspirin  chewable tablet 81 mg  81 mg Oral Daily Azadegan, Armando, MD   81 mg at 06/08/24 9057   bisacodyl  (DULCOLAX) suppository 10 mg  10 mg Rectal PRN Juberg, Christopher, DO   10 mg at 06/06/24 0231   Chlorhexidine  Gluconate Cloth 2 % PADS 6 each  6 each Topical Daily Trudy Mliss Dragon, MD   6 each at 06/08/24 9057   Chlorhexidine  Gluconate Cloth  2 % PADS 6 each  6 each Topical Q0600 Geralynn Charleston, MD       divalproex  (DEPAKOTE ) DR tablet 125 mg  125 mg Oral Q1200 Juberg, Christopher, DO   125 mg at 06/08/24 1124   feeding supplement (ENSURE PLUS HIGH PROTEIN) liquid 237 mL  237 mL Oral BID BM Trudy Mliss Dragon, MD   237 mL at 06/08/24 9057   melatonin tablet 3 mg  3 mg Oral QHS Juberg, Christopher, DO   3 mg at 06/07/24 2115   multivitamin (RENA-VIT) tablet 1 tablet  1 tablet Oral QHS Geralynn Charleston, MD   1 tablet at 06/07/24 2115   mupirocin ointment (BACTROBAN) 2 % 1 Application  1  Application Nasal BID Trudy Mliss Dragon, MD   1 Application at 06/08/24 9057   senna (SENOKOT) tablet 8.6 mg  1 tablet Oral BID Juberg, Christopher, DO   8.6 mg at 06/08/24 9057     Discharge Medications: Please see discharge summary for a list of discharge medications.  Relevant Imaging Results:  Relevant Lab Results:   Additional Information SS#: [id number]  Gregory CHRISTELLA Goodie, LCSW

## 2024-06-08 NOTE — Care Management Important Message (Signed)
 Important Message  Patient Details  Name: Gregory Hood MRN: [id number] Date of Birth: 1951-06-07   Important Message Given:  Yes - Medicare IM     Claretta Deed 06/08/2024, 2:41 PM

## 2024-06-08 NOTE — Progress Notes (Signed)
 Pt to be discharged back to [facility] via [facility]. Report called to RN Jinnie at facility. AVS printed and placed with other paperwork to be given to [facility] on arrival. Pts IV removed and tele disconnected prior to discharge. Pts belongings gathered and pt assisted into clothing for transportation. Pt transported off unit via stretcher and [facility]. Pts wife notified of pts transport on [facility] arrival. Will continue to monitor

## 2024-06-09 LAB — CULTURE, BLOOD (ROUTINE X 2)
Culture: NO GROWTH
Special Requests: ADEQUATE

## 2024-06-10 NOTE — Discharge Planning (Signed)
 [facility] Patient Discharge Orders- [facility] CLINIC: [place]  Patient's name: LENN VOLKER Admit/DC Dates: 06/04/2024 - 06/08/2024  Discharge Diagnoses: Fall/subdural hematoma   ESRD  Aranesp : Given: no   Date and amount of last dose: N/A  Last Hgb: 9.5 PRBC's Given: no Date/# of units: N/A ESA dose for discharge: same dose  IV Iron dose at discharge: same dose  Heparin  change: no  EDW Change: no New EDW:   Bath Change: no  Access intervention/Change: no Details:  Hectorol/Calcitriol  change: no  Discharge Labs: Calcium  8.4 Phosphorus 4.4 Albumin 2.8 K+ 4.5  IV Antibiotics: no Details:  On Coumadin?: no Last INR: Next INR: Managed By:   OTHER/APPTS/LAB ORDERS:    D/C Meds to be reconciled by nurse after every discharge.  Completed By: Lucie Collet, PA-C 06/10/2024, 2:45 PM  [facility] Pager: [phone]   Reviewed by: MD:______ RN_______
